# Patient Record
Sex: Female | Born: 1960 | Race: White | Hispanic: No | Marital: Married | Smoking: Former smoker
Health system: Southern US, Community
[De-identification: ages and names within clinical notes are randomized; demographics above are authoritative.]

## PROBLEM LIST (undated history)

## (undated) DIAGNOSIS — Z87442 Personal history of urinary calculi: Secondary | ICD-10-CM

## (undated) DIAGNOSIS — I10 Essential (primary) hypertension: Secondary | ICD-10-CM

## (undated) DIAGNOSIS — N289 Disorder of kidney and ureter, unspecified: Secondary | ICD-10-CM

## (undated) DIAGNOSIS — K219 Gastro-esophageal reflux disease without esophagitis: Secondary | ICD-10-CM

## (undated) DIAGNOSIS — C801 Malignant (primary) neoplasm, unspecified: Secondary | ICD-10-CM

## (undated) DIAGNOSIS — C9000 Multiple myeloma not having achieved remission: Secondary | ICD-10-CM

## (undated) DIAGNOSIS — I739 Peripheral vascular disease, unspecified: Secondary | ICD-10-CM

## (undated) HISTORY — PX: CHOLECYSTECTOMY: SHX55

## (undated) HISTORY — DX: Multiple myeloma not having achieved remission: C90.00

## (undated) HISTORY — DX: Essential (primary) hypertension: I10

## (undated) HISTORY — DX: Peripheral vascular disease, unspecified: I73.9

---

## 2005-06-17 ENCOUNTER — Ambulatory Visit: Payer: Self-pay | Admitting: Cardiology

## 2005-12-30 ENCOUNTER — Ambulatory Visit: Payer: Self-pay | Admitting: Cardiology

## 2015-01-05 HISTORY — PX: COLONOSCOPY: SHX174

## 2017-05-13 ENCOUNTER — Encounter: Payer: Self-pay | Admitting: Gastroenterology

## 2017-07-05 ENCOUNTER — Ambulatory Visit: Payer: Self-pay | Admitting: Nurse Practitioner

## 2017-08-27 ENCOUNTER — Encounter: Payer: Self-pay | Admitting: *Deleted

## 2017-08-27 ENCOUNTER — Ambulatory Visit (INDEPENDENT_AMBULATORY_CARE_PROVIDER_SITE_OTHER): Payer: BLUE CROSS/BLUE SHIELD | Admitting: Gastroenterology

## 2017-08-27 ENCOUNTER — Other Ambulatory Visit: Payer: Self-pay | Admitting: *Deleted

## 2017-08-27 ENCOUNTER — Encounter: Payer: Self-pay | Admitting: Gastroenterology

## 2017-08-27 DIAGNOSIS — K649 Unspecified hemorrhoids: Secondary | ICD-10-CM

## 2017-08-27 DIAGNOSIS — Z86010 Personal history of colonic polyps: Secondary | ICD-10-CM

## 2017-08-27 MED ORDER — PEG 3350-KCL-NA BICARB-NACL 420 G PO SOLR: 4000.0000 mL | Freq: Once | ORAL | 0 refills | Status: AC

## 2017-08-27 NOTE — Patient Instructions (Signed)
1. Colonoscopy as scheduled. See separate instructions.  

## 2017-08-27 NOTE — Progress Notes (Signed)
Primary Care Physician:  [name], MD  Primary Gastroenterologist:  Garfield Cornea, MD   Chief Complaint  Patient presents with  . Colonoscopy    hx of polyps/ hemorrhoids    HPI:  Megan Velasquez is a 57 y.o. female here to establish GI care and because she is due for a colonoscopy.  Patient's last colonoscopy was in 2016 by Dr. Britta Mccreedy.  She had a couple of polyps removed, one was a serrated adenoma changes noted.  She was told to come back in 3 years.  She states she is doing well.  Bowel movements are regular.  No blood in the stool or melena.  Denies any abdominal pain.  Reflux well controlled on Nexium or over-the-counter agents.  No dysphagia.  No vomiting.  No unintentional weight loss.  She complains of hemorrhoids to aggravate her from time to time, associated with discomfort and making it difficult to clean up after a bowel movement.  Current Outpatient Medications  Medication Sig Dispense Refill  . aspirin EC 81 MG tablet Take 81 mg by mouth daily.    Marland Kitchen esomeprazole (NEXIUM) 20 MG capsule Take 20 mg by mouth daily as needed.    Marland Kitchen lisinopril-hydrochlorothiazide (PRINZIDE,ZESTORETIC) 10-12.5 MG tablet Take 1 tablet by mouth daily.    . NON FORMULARY Vitamin D3   5000 IU  daily    . vitamin C (ASCORBIC ACID) 500 MG tablet Take 500 mg by mouth daily.     No current facility-administered medications for this visit.     Allergies as of 08/27/2017  . (No Known Allergies)    Past Medical History:  Diagnosis Date  . HTN (hypertension)     Past Surgical History:  Procedure Laterality Date  . CHOLECYSTECTOMY    . COLONOSCOPY  01/2015   Dr. Britta Mccreedy: Mild diverticulosis, sessile polyp ranging 3 to 5 mm removed from the proximal transverse colon, semi-pedunculated polyp 5 to 9 mm in size removed from the sigmoid colon.  Sigmoid colon polyp was serrated adenoma, transverse colon polyp was adenomatous.  Patient was told to have another colonoscopy in 3 years.    Family History   Problem Relation Age of Onset  . Heart disease Mother   . Diabetes Mother   . Lung cancer Father   . COPD Father   . Colon cancer Neg Hx     Social History   Socioeconomic History  . Marital status: Married    Spouse name: Not on file  . Number of children: Not on file  . Years of education: Not on file  . Highest education level: Not on file  Occupational History  . Not on file  Social Needs  . Financial resource strain: Not on file  . Food insecurity:    Worry: Not on file    Inability: Not on file  . Transportation needs:    Medical: Not on file    Non-medical: Not on file  Tobacco Use  . Smoking status: Former Research scientist (life sciences)  . Smokeless tobacco: Never Used  . Tobacco comment: Quit x 3 years  Substance and Sexual Activity  . Alcohol use: Yes    Comment: 2 beers per year  . Drug use: Never  . Sexual activity: Not on file  Lifestyle  . Physical activity:    Days per week: Not on file    Minutes per session: Not on file  . Stress: Not on file  Relationships  . Social connections:    Talks on phone: Not  on file    Gets together: Not on file    Attends religious service: Not on file    Active member of club or organization: Not on file    Attends meetings of clubs or organizations: Not on file    Relationship status: Not on file  . Intimate partner violence:    Fear of current or ex partner: Not on file    Emotionally abused: Not on file    Physically abused: Not on file    Forced sexual activity: Not on file  Other Topics Concern  . Not on file  Social History Narrative  . Not on file      ROS:  General: Negative for anorexia, weight loss, fever, chills, fatigue, weakness. Eyes: Negative for vision changes.  ENT: Negative for hoarseness, difficulty swallowing, nasal congestion. CV: Negative for chest pain, angina, palpitations, dyspnea on exertion, peripheral edema.  Respiratory: Negative for dyspnea at rest, dyspnea on exertion, cough, sputum, wheezing.   GI: See history of present illness. GU:  Negative for dysuria, hematuria, urinary incontinence, urinary frequency, nocturnal urination.  MS: Negative for joint pain, low back pain.  Derm: Negative for rash or itching.  Neuro: Negative for weakness, abnormal sensation, seizure, frequent headaches, memory loss, confusion.  Psych: Negative for anxiety, depression, suicidal ideation, hallucinations.  Endo: Negative for unusual weight change.  Heme: Negative for bruising or bleeding. Allergy: Negative for rash or hives.    Physical Examination:  BP 124/78   Pulse 69   Temp 97.8 F (36.6 C) (Oral)   Ht 5\' 5"  (1.651 m)   Wt 142 lb (64.4 kg)   BMI 23.63 kg/m    General: Well-nourished, well-developed in no acute distress.  Accompanied by spouse Head: Normocephalic, atraumatic.   Eyes: Conjunctiva pink, no icterus. Mouth: Oropharyngeal mucosa moist and pink , no lesions erythema or exudate. Neck: Supple without thyromegaly, masses, or lymphadenopathy.  Lungs: Clear to auscultation bilaterally.  Heart: Regular rate and rhythm, no murmurs rubs or gallops.  Abdomen: Bowel sounds are normal, nontender, nondistended, no hepatosplenomegaly or masses, no abdominal bruits or    hernia , no rebound or guarding.   Rectal: Not performed Extremities: No lower extremity edema. No clubbing or deformities.  Neuro: Alert and oriented x 4 , grossly normal neurologically.  Skin: Warm and dry, no rash or jaundice.   Psych: Alert and cooperative, normal mood and affect.   Imaging Studies: No results found.

## 2017-08-27 NOTE — Assessment & Plan Note (Signed)
57 year old female with history of adenomatous colon polyps advised to come back in 3 years for surveillance purposes by her prior gastroenterologist who presents for further management.  She complains of intermittent flare of her hemorrhoids.  We discussed possible hemorrhoid banding [facility]) if she is a good candidate.  Can be evaluated at time of colonoscopy.  Patient like to have colonoscopy done with similar sedation as before.  We will plan on deep sedation in the near future.  I have discussed the risks, alternatives, benefits with regards to but not limited to the risk of reaction to medication, bleeding, infection, perforation and the patient is agreeable to proceed. Written consent to be obtained.

## 2017-08-31 ENCOUNTER — Telehealth: Payer: Self-pay | Admitting: *Deleted

## 2017-08-31 NOTE — Telephone Encounter (Signed)
Pre-op scheduled for 10/06/17 at 10:00am. Letter mailed. LMOVM

## 2017-08-31 NOTE — Progress Notes (Signed)
CC'D TO PCP °

## 2017-08-31 NOTE — Telephone Encounter (Signed)
Called [employer] and was advised no PA is required for TCS. Ref # [id number]

## 2017-10-01 ENCOUNTER — Other Ambulatory Visit (HOSPITAL_COMMUNITY): Payer: BLUE CROSS/BLUE SHIELD

## 2017-10-04 NOTE — Patient Instructions (Signed)
Megan Velasquez  10/04/2017     @PREFPERIOPPHARMACY @   Your procedure is scheduled on   10/11/2017 .  Report to [facility] at  1130  A.M.  Call this number if you have problems the morning of surgery:  [phone]   Remember:  Do not eat or drink after midnight.  You may drink clear liquids until ( follow the instructions given to you) .  Clear liquids allowed are:                    Water, Juice (non-citric and without pulp), Carbonated beverages, Clear Tea, Black Coffee only, Plain Jell-O only, Gatorade and Plain Popsicles only    Take these medicines the morning of surgery with A SIP OF WATER  Nexium, lisinopril.    Do not wear jewelry, make-up or nail polish.  Do not wear lotions, powders, or perfumes, or deodorant.  Do not shave 48 hours prior to surgery.  Men may shave face and neck.  Do not bring valuables to the hospital.  [facility] is not responsible for any belongings or valuables.  Contacts, dentures or bridgework may not be worn into surgery.  Leave your suitcase in the car.  After surgery it may be brought to your room.  For patients admitted to the hospital, discharge time will be determined by your treatment team.  Patients discharged the day of surgery will not be allowed to drive home.   Name and phone number of your driver:   family Special instructions:  Follow the diet and prep instructions given to you by Dr Roseanne Kaufman office.  Please read over the following fact sheets that you were given. Anesthesia Post-op Instructions and Care and Recovery After Surgery       Colonoscopy, Adult A colonoscopy is an exam to look at the large intestine. It is done to check for problems, such as:  Lumps (tumors).  Growths (polyps).  Swelling (inflammation).  Bleeding.  What happens before the procedure? Eating and drinking Follow instructions from your doctor about eating and drinking. These instructions may include:  A few days before the  procedure - follow a low-fiber diet. ? Avoid nuts. ? Avoid seeds. ? Avoid dried fruit. ? Avoid raw fruits. ? Avoid vegetables.  1-3 days before the procedure - follow a clear liquid diet. Avoid liquids that have red or purple dye. Drink only clear liquids, such as: ? Clear broth or bouillon. ? Black coffee or tea. ? Clear juice. ? Clear soft drinks or sports drinks. ? Gelatin dessert. ? Popsicles.  On the day of the procedure - do not eat or drink anything during the 2 hours before the procedure.  Bowel prep If you were prescribed an oral bowel prep:  Take it as told by your doctor. Starting the day before your procedure, you will need to drink a lot of liquid. The liquid will cause you to poop (have bowel movements) until your poop is almost clear or light green.  If your skin or butt gets irritated from diarrhea, you may: ? Wipe the area with wipes that have medicine in them, such as adult wet wipes with aloe and vitamin E. ? Put something on your skin that soothes the area, such as petroleum jelly.  If you throw up (vomit) while drinking the bowel prep, take a break for up to 60 minutes. Then begin the bowel prep again. If you keep throwing up and  you cannot take the bowel prep without throwing up, call your doctor.  General instructions  Ask your doctor about changing or stopping your normal medicines. This is important if you take diabetes medicines or blood thinners.  Plan to have someone take you home from the hospital or clinic. What happens during the procedure?  An IV tube may be put into one of your veins.  You will be given medicine to help you relax (sedative).  To reduce your risk of infection: ? Your doctors will wash their hands. ? Your anal area will be washed with soap.  You will be asked to lie on your side with your knees bent.  Your doctor will get a long, thin, flexible tube ready. The tube will have a camera and a light on the end.  The tube will  be put into your anus.  The tube will be gently put into your large intestine.  Air will be delivered into your large intestine to keep it open. You may feel some pressure or cramping.  The camera will be used to take photos.  A small tissue sample may be removed from your body to be looked at under a microscope (biopsy). If any possible problems are found, the tissue will be sent to a lab for testing.  If small growths are found, your doctor may remove them and have them checked for cancer.  The tube that was put into your anus will be slowly removed. The procedure may vary among doctors and hospitals. What happens after the procedure?  Your doctor will check on you often until the medicines you were given have worn off.  Do not drive for 24 hours after the procedure.  You may have a small amount of blood in your poop.  You may pass gas.  You may have mild cramps or bloating in your belly (abdomen).  It is up to you to get the results of your procedure. Ask your doctor, or the department performing the procedure, when your results will be ready. This information is not intended to replace advice given to you by your health care provider. Make sure you discuss any questions you have with your health care provider. Document Released: 04/25/2010 Document Revised: 01/22/2016 Document Reviewed: 06/04/2015 Elsevier Interactive Patient Education  2017 [employer]  Colonoscopy, Adult, Care After This sheet gives you information about how to care for yourself after your procedure. Your health care provider may also give you more specific instructions. If you have problems or questions, contact your health care provider. What can I expect after the procedure? After the procedure, it is common to have:  A small amount of blood in your stool for 24 hours after the procedure.  Some gas.  Mild abdominal cramping or bloating.  Follow these instructions at home: General  instructions   For the first 24 hours after the procedure: ? Do not drive or use machinery. ? Do not sign important documents. ? Do not drink alcohol. ? Do your regular daily activities at a slower pace than normal. ? Eat soft, easy-to-digest foods. ? Rest often.  Take over-the-counter or prescription medicines only as told by your health care provider.  It is up to you to get the results of your procedure. Ask your health care provider, or the department performing the procedure, when your results will be ready. Relieving cramping and bloating  Try walking around when you have cramps or feel bloated.  Apply heat to your abdomen as told  by your health care provider. Use a heat source that your health care provider recommends, such as a moist heat pack or a heating pad. ? Place a towel between your skin and the heat source. ? Leave the heat on for 20-30 minutes. ? Remove the heat if your skin turns bright red. This is especially important if you are unable to feel pain, heat, or cold. You may have a greater risk of getting burned. Eating and drinking  Drink enough fluid to keep your urine clear or pale yellow.  Resume your normal diet as instructed by your health care provider. Avoid heavy or fried foods that are hard to digest.  Avoid drinking alcohol for as long as instructed by your health care provider. Contact a health care provider if:  You have blood in your stool 2-3 days after the procedure. Get help right away if:  You have more than a small spotting of blood in your stool.  You pass large blood clots in your stool.  Your abdomen is swollen.  You have nausea or vomiting.  You have a fever.  You have increasing abdominal pain that is not relieved with medicine. This information is not intended to replace advice given to you by your health care provider. Make sure you discuss any questions you have with your health care provider. Document Released: 11/05/2003  Document Revised: 12/16/2015 Document Reviewed: 06/04/2015 Elsevier Interactive Patient Education  2018 [place] Anesthesia is a term that refers to techniques, procedures, and medicines that help a person stay safe and comfortable during a medical procedure. Monitored anesthesia care, or sedation, is one type of anesthesia. Your anesthesia specialist may recommend sedation if you will be having a procedure that does not require you to be unconscious, such as:  Cataract surgery.  A dental procedure.  A biopsy.  A colonoscopy.  During the procedure, you may receive a medicine to help you relax (sedative). There are three levels of sedation:  Mild sedation. At this level, you may feel awake and relaxed. You will be able to follow directions.  Moderate sedation. At this level, you will be sleepy. You may not remember the procedure.  Deep sedation. At this level, you will be asleep. You will not remember the procedure.  The more medicine you are given, the deeper your level of sedation will be. Depending on how you respond to the procedure, the anesthesia specialist may change your level of sedation or the type of anesthesia to fit your needs. An anesthesia specialist will monitor you closely during the procedure. Let your health care provider know about:  Any allergies you have.  All medicines you are taking, including vitamins, herbs, eye drops, creams, and over-the-counter medicines.  Any use of steroids (by mouth or as a cream).  Any problems you or family members have had with sedatives and anesthetic medicines.  Any blood disorders you have.  Any surgeries you have had.  Any medical conditions you have, such as sleep apnea.  Whether you are pregnant or may be pregnant.  Any use of cigarettes, alcohol, or street drugs. What are the risks? Generally, this is a safe procedure. However, problems may occur, including:  Getting too much  medicine (oversedation).  Nausea.  Allergic reaction to medicines.  Trouble breathing. If this happens, a breathing tube may be used to help with breathing. It will be removed when you are awake and breathing on your own.  Heart trouble.  Lung trouble.  Before the procedure Staying hydrated Follow instructions from your health care provider about hydration, which may include:  Up to 2 hours before the procedure - you may continue to drink clear liquids, such as water, clear fruit juice, black coffee, and plain tea.  Eating and drinking restrictions Follow instructions from your health care provider about eating and drinking, which may include:  8 hours before the procedure - stop eating heavy meals or foods such as meat, fried foods, or fatty foods.  6 hours before the procedure - stop eating light meals or foods, such as toast or cereal.  6 hours before the procedure - stop drinking milk or drinks that contain milk.  2 hours before the procedure - stop drinking clear liquids.  Medicines Ask your health care provider about:  Changing or stopping your regular medicines. This is especially important if you are taking diabetes medicines or blood thinners.  Taking medicines such as aspirin and ibuprofen. These medicines can thin your blood. Do not take these medicines before your procedure if your health care provider instructs you not to.  Tests and exams  You will have a physical exam.  You may have blood tests done to show: ? How well your kidneys and liver are working. ? How well your blood can clot.  General instructions  Plan to have someone take you home from the hospital or clinic.  If you will be going home right after the procedure, plan to have someone with you for 24 hours.  What happens during the procedure?  Your blood pressure, heart rate, breathing, level of pain and overall condition will be monitored.  An IV tube will be inserted into one of your  veins.  Your anesthesia specialist will give you medicines as needed to keep you comfortable during the procedure. This may mean changing the level of sedation.  The procedure will be performed. After the procedure  Your blood pressure, heart rate, breathing rate, and blood oxygen level will be monitored until the medicines you were given have worn off.  Do not drive for 24 hours if you received a sedative.  You may: ? Feel sleepy, clumsy, or nauseous. ? Feel forgetful about what happened after the procedure. ? Have a sore throat if you had a breathing tube during the procedure. ? Vomit. This information is not intended to replace advice given to you by your health care provider. Make sure you discuss any questions you have with your health care provider. Document Released: 12/17/2004 Document Revised: 08/30/2015 Document Reviewed: 07/14/2015 Elsevier Interactive Patient Education  2018 [place], Care After These instructions provide you with information about caring for yourself after your procedure. Your health care provider may also give you more specific instructions. Your treatment has been planned according to current medical practices, but problems sometimes occur. Call your health care provider if you have any problems or questions after your procedure. What can I expect after the procedure? After your procedure, it is common to:  Feel sleepy for several hours.  Feel clumsy and have poor balance for several hours.  Feel forgetful about what happened after the procedure.  Have poor judgment for several hours.  Feel nauseous or vomit.  Have a sore throat if you had a breathing tube during the procedure.  Follow these instructions at home: For at least 24 hours after the procedure:   Do not: ? Participate in activities in which you could fall or become injured. ? Drive. ?  Use heavy machinery. ? Drink alcohol. ? Take sleeping pills or  medicines that cause drowsiness. ? Make important decisions or sign legal documents. ? Take care of children on your own.  Rest. Eating and drinking  Follow the diet that is recommended by your health care provider.  If you vomit, drink water, juice, or soup when you can drink without vomiting.  Make sure you have little or no nausea before eating solid foods. General instructions  Have a responsible adult stay with you until you are awake and alert.  Take over-the-counter and prescription medicines only as told by your health care provider.  If you smoke, do not smoke without supervision.  Keep all follow-up visits as told by your health care provider. This is important. Contact a health care provider if:  You keep feeling nauseous or you keep vomiting.  You feel light-headed.  You develop a rash.  You have a fever. Get help right away if:  You have trouble breathing. This information is not intended to replace advice given to you by your health care provider. Make sure you discuss any questions you have with your health care provider. Document Released: 07/14/2015 Document Revised: 11/13/2015 Document Reviewed: 07/14/2015 Elsevier Interactive Patient Education  [employer].

## 2017-10-06 ENCOUNTER — Telehealth: Payer: Self-pay | Admitting: *Deleted

## 2017-10-06 ENCOUNTER — Encounter (HOSPITAL_COMMUNITY)
Admission: RE | Admit: 2017-10-06 | Discharge: 2017-10-06 | Disposition: A | Discharge status: Home / Self Care | Payer: BLUE CROSS/BLUE SHIELD | Source: Ambulatory Visit | Attending: Internal Medicine | Admitting: Internal Medicine

## 2017-10-06 NOTE — Telephone Encounter (Signed)
Received call from [place]. Patient missed her pre-op appt. Procedure scheduled for 10/11/17.   Called pt and LMOVM. Called # listed for spouse. Spoke with patient. She stated she had cataract surgery. Spoke with Hoyle Sauer and patient can do pre-op 10/08/17 at 9:30am. Called patient back and made her aware. Nothing further needed

## 2017-10-08 ENCOUNTER — Encounter (HOSPITAL_COMMUNITY): Payer: Self-pay

## 2017-10-08 ENCOUNTER — Encounter (HOSPITAL_COMMUNITY)
Admission: RE | Admit: 2017-10-08 | Discharge: 2017-10-08 | Disposition: A | Discharge status: Home / Self Care | Payer: BLUE CROSS/BLUE SHIELD | Source: Ambulatory Visit | Attending: Internal Medicine | Admitting: Internal Medicine

## 2017-10-08 ENCOUNTER — Ambulatory Visit (HOSPITAL_COMMUNITY): Admit: 2017-10-08 | Payer: BLUE CROSS/BLUE SHIELD | Admitting: Internal Medicine

## 2017-10-08 ENCOUNTER — Other Ambulatory Visit: Payer: Self-pay

## 2017-10-08 DIAGNOSIS — R9431 Abnormal electrocardiogram [ECG] [EKG]: Secondary | ICD-10-CM | POA: Diagnosis not present

## 2017-10-08 DIAGNOSIS — Z01810 Encounter for preprocedural cardiovascular examination: Secondary | ICD-10-CM | POA: Diagnosis present

## 2017-10-08 DIAGNOSIS — Z01812 Encounter for preprocedural laboratory examination: Secondary | ICD-10-CM | POA: Insufficient documentation

## 2017-10-08 HISTORY — DX: Gastro-esophageal reflux disease without esophagitis: K21.9

## 2017-10-08 HISTORY — DX: Personal history of urinary calculi: Z87.442

## 2017-10-08 LAB — CBC WITH DIFFERENTIAL/PLATELET
BASOS ABS: 0 10*3/uL (ref 0.0–0.1)
Basophils Relative: 0 %
EOS ABS: 0.1 10*3/uL (ref 0.0–0.7)
EOS PCT: 2 %
HEMATOCRIT: 37.5 % (ref 36.0–46.0)
Hemoglobin: 12.4 g/dL (ref 12.0–15.0)
Lymphocytes Relative: 30 %
Lymphs Abs: 2.2 10*3/uL (ref 0.7–4.0)
MCH: 30.2 pg (ref 26.0–34.0)
MCHC: 33.1 g/dL (ref 30.0–36.0)
MCV: 91.2 fL (ref 78.0–100.0)
MONO ABS: 0.5 10*3/uL (ref 0.1–1.0)
Monocytes Relative: 6 %
Neutro Abs: 4.4 10*3/uL (ref 1.7–7.7)
Neutrophils Relative %: 62 %
PLATELETS: 216 10*3/uL (ref 150–400)
RBC: 4.11 MIL/uL (ref 3.87–5.11)
RDW: 13.4 % (ref 11.5–15.5)
WBC: 7.2 10*3/uL (ref 4.0–10.5)

## 2017-10-08 LAB — BASIC METABOLIC PANEL
Anion gap: 11 (ref 5–15)
BUN: 18 mg/dL (ref 6–20)
CO2: 28 mmol/L (ref 22–32)
Calcium: 9.1 mg/dL (ref 8.9–10.3)
Chloride: 101 mmol/L (ref 98–111)
Creatinine, Ser: 0.96 mg/dL (ref 0.44–1.00)
GFR calc Af Amer: >60 mL/min (ref >60)
GLUCOSE: 86 mg/dL (ref 70–99)
POTASSIUM: 3.6 mmol/L (ref 3.5–5.1)
SODIUM: 140 mmol/L (ref 135–145)

## 2017-10-08 SURGERY — COLONOSCOPY WITH PROPOFOL
Anesthesia: Monitor Anesthesia Care

## 2017-10-11 ENCOUNTER — Ambulatory Visit (HOSPITAL_COMMUNITY)
Admission: RE | Admit: 2017-10-11 | Discharge: 2017-10-11 | Disposition: A | Discharge status: Home / Self Care | Payer: BLUE CROSS/BLUE SHIELD | Source: Ambulatory Visit | Attending: Internal Medicine | Admitting: Internal Medicine

## 2017-10-11 ENCOUNTER — Ambulatory Visit (HOSPITAL_COMMUNITY): Payer: BLUE CROSS/BLUE SHIELD | Admitting: Anesthesiology

## 2017-10-11 ENCOUNTER — Encounter (HOSPITAL_COMMUNITY)
Admission: RE | Disposition: A | Discharge status: Home / Self Care | Payer: Self-pay | Source: Ambulatory Visit | Attending: Internal Medicine

## 2017-10-11 ENCOUNTER — Encounter (HOSPITAL_COMMUNITY): Payer: Self-pay | Admitting: Certified Registered"

## 2017-10-11 DIAGNOSIS — K219 Gastro-esophageal reflux disease without esophagitis: Secondary | ICD-10-CM | POA: Diagnosis not present

## 2017-10-11 DIAGNOSIS — D124 Benign neoplasm of descending colon: Secondary | ICD-10-CM | POA: Diagnosis not present

## 2017-10-11 DIAGNOSIS — Z1211 Encounter for screening for malignant neoplasm of colon: Secondary | ICD-10-CM | POA: Diagnosis not present

## 2017-10-11 DIAGNOSIS — K5730 Diverticulosis of large intestine without perforation or abscess without bleeding: Secondary | ICD-10-CM

## 2017-10-11 DIAGNOSIS — Z87891 Personal history of nicotine dependence: Secondary | ICD-10-CM | POA: Insufficient documentation

## 2017-10-11 DIAGNOSIS — Z79899 Other long term (current) drug therapy: Secondary | ICD-10-CM | POA: Diagnosis not present

## 2017-10-11 DIAGNOSIS — K641 Second degree hemorrhoids: Secondary | ICD-10-CM | POA: Diagnosis not present

## 2017-10-11 DIAGNOSIS — Z7982 Long term (current) use of aspirin: Secondary | ICD-10-CM | POA: Diagnosis not present

## 2017-10-11 DIAGNOSIS — Z87442 Personal history of urinary calculi: Secondary | ICD-10-CM | POA: Diagnosis not present

## 2017-10-11 DIAGNOSIS — Z86010 Personal history of colonic polyps: Secondary | ICD-10-CM | POA: Insufficient documentation

## 2017-10-11 DIAGNOSIS — I10 Essential (primary) hypertension: Secondary | ICD-10-CM | POA: Diagnosis not present

## 2017-10-11 HISTORY — PX: COLONOSCOPY WITH PROPOFOL: SHX5780

## 2017-10-11 HISTORY — PX: POLYPECTOMY: SHX5525

## 2017-10-11 SURGERY — COLONOSCOPY WITH PROPOFOL
Anesthesia: Monitor Anesthesia Care

## 2017-10-11 MED ORDER — HYDROMORPHONE HCL 1 MG/ML IJ SOLN: 0.2500 mg | INTRAMUSCULAR | Status: DC | PRN

## 2017-10-11 MED ORDER — LIDOCAINE HCL (PF) 1 % IJ SOLN
INTRAMUSCULAR | Status: DC | PRN
  Administered 2017-10-11: 20 mg

## 2017-10-11 MED ORDER — LACTATED RINGERS IV SOLN: INTRAVENOUS | Status: DC

## 2017-10-11 MED ORDER — MEPERIDINE HCL 100 MG/ML IJ SOLN: 6.2500 mg | INTRAMUSCULAR | Status: DC | PRN

## 2017-10-11 MED ORDER — PROMETHAZINE HCL 25 MG/ML IJ SOLN: 6.2500 mg | INTRAMUSCULAR | Status: DC | PRN

## 2017-10-11 MED ORDER — HYDROCODONE-ACETAMINOPHEN 7.5-325 MG PO TABS: 1.0000 | ORAL_TABLET | Freq: Once | ORAL | Status: DC | PRN

## 2017-10-11 MED ORDER — PROPOFOL 10 MG/ML IV BOLUS
INTRAVENOUS | Status: DC | PRN
  Administered 2017-10-11: 50 mg via INTRAVENOUS
  Administered 2017-10-11: 100 mg via INTRAVENOUS
  Administered 2017-10-11: 20 mg via INTRAVENOUS
  Administered 2017-10-11: 50 mg via INTRAVENOUS
  Administered 2017-10-11: 30 mg via INTRAVENOUS

## 2017-10-11 MED ORDER — LACTATED RINGERS IV SOLN
INTRAVENOUS | Status: DC | PRN
  Administered 2017-10-11: 14:00:00 via INTRAVENOUS

## 2017-10-11 MED ORDER — PROPOFOL 10 MG/ML IV BOLUS
INTRAVENOUS | Status: AC
  Filled 2017-10-11: qty 20

## 2017-10-11 NOTE — H&P (Signed)
@LOGO @   Primary Care Physician:  [name], MD Primary Gastroenterologist:  Dr. [name]  Pre-Procedure History & Physical: HPI:  Megan Velasquez is a 57 y.o. female here for surveillance colonoscopy. History of multiple colonic polyps removed in [facility] about 3 years ago.  Intermittent dangling hemorrhoids or an aggravation for her as she reports.   Past Medical History:  Diagnosis Date  . GERD (gastroesophageal reflux disease)   . History of kidney stones   . HTN (hypertension)     Past Surgical History:  Procedure Laterality Date  . CHOLECYSTECTOMY    . COLONOSCOPY  01/2015   Dr. Britta Mccreedy: Mild diverticulosis, sessile polyp ranging 3 to 5 mm removed from the proximal transverse colon, semi-pedunculated polyp 5 to 9 mm in size removed from the sigmoid colon.  Sigmoid colon polyp was serrated adenoma, transverse colon polyp was adenomatous.  Patient was told to have another colonoscopy in 3 years.    Prior to Admission medications   Medication Sig Start Date End Date Taking? Authorizing Provider  acetaminophen (TYLENOL) 500 MG tablet Take 1,000 mg by mouth daily as needed for headache.   Yes [provider]  aspirin EC 81 MG tablet Take 81 mg by mouth daily.   Yes [provider]  Carboxymethylcellul-Glycerin (LUBRICATING EYE DROPS OP) Place 1 drop into the right eye 4 (four) times daily. Use for 3 days prior to surgery   Yes [provider]  Cholecalciferol (VITAMIN D3) 5000 units CAPS Take 5,000 Units by mouth daily.   Yes [provider]  lisinopril-hydrochlorothiazide (PRINZIDE,ZESTORETIC) 10-12.5 MG tablet Take 1 tablet by mouth daily.   Yes [provider]  loratadine (CLARITIN) 10 MG tablet Take 10 mg by mouth daily as needed for allergies.   Yes [provider]  vitamin C (ASCORBIC ACID) 500 MG tablet Take 500 mg by mouth daily.   Yes [provider]  esomeprazole (NEXIUM) 20 MG capsule Take 20-40 mg by  mouth daily as needed (acid reflux).     [provider]    Allergies as of 08/27/2017  . (No Known Allergies)    Family History  Problem Relation Age of Onset  . Heart disease Mother   . Diabetes Mother   . Lung cancer Father   . COPD Father   . Colon cancer Neg Hx     Social History   Socioeconomic History  . Marital status: Married    Spouse name: Not on file  . Number of children: Not on file  . Years of education: Not on file  . Highest education level: Not on file  Occupational History  . Not on file  Social Needs  . Financial resource strain: Not on file  . Food insecurity:    Worry: Not on file    Inability: Not on file  . Transportation needs:    Medical: Not on file    Non-medical: Not on file  Tobacco Use  . Smoking status: Former Smoker    Packs/day: 1.00    Years: 28.00    Pack years: 28.00    Types: Cigarettes    Last attempt to quit: 10/08/2013    Years since quitting: 4.0  . Smokeless tobacco: Never Used  Substance and Sexual Activity  . Alcohol use: Yes    Comment: 2 beers per year  . Drug use: Never  . Sexual activity: Yes    Birth control/protection: Post-menopausal  Lifestyle  . Physical activity:  Days per week: Not on file    Minutes per session: Not on file  . Stress: Not on file  Relationships  . Social connections:    Talks on phone: Not on file    Gets together: Not on file    Attends religious service: Not on file    Active member of club or organization: Not on file    Attends meetings of clubs or organizations: Not on file    Relationship status: Not on file  . Intimate partner violence:    Fear of current or ex partner: Not on file    Emotionally abused: Not on file    Physically abused: Not on file    Forced sexual activity: Not on file  Other Topics Concern  . Not on file  Social History Narrative  . Not on file    Review of Systems: See HPI, otherwise negative ROS  Physical Exam: BP 125/77 (BP  Location: Left Arm)   Pulse 88   Temp 97.9 F (36.6 C)   Resp 18   SpO2 97%  General:   Alert,  Well-developed, well-nourished, pleasant and cooperative in NAD Neck:  Supple; no masses or thyromegaly. No significant cervical adenopathy. Lungs:  Clear throughout to auscultation.   No wheezes, crackles, or rhonchi. No acute distress. Heart:  Regular rate and rhythm; no murmurs, clicks, rubs,  or gallops. Abdomen: Non-distended, normal bowel sounds.  Soft and nontender without appreciable mass or hepatosplenomegaly.  Pulses:  Normal pulses noted. Extremities:  Without clubbing or edema.  Impression:  57 year old here for surveillance colonoscopy. History of colonic polyps removed in the 3 years ago. By history, has issues with hemorrhoids.  Recommendations:  I have offered the patient a colonoscopy today.  The risks, benefits, limitations, alternatives and imponderables have been reviewed with the patient. Questions have been answered. All parties are agreeable.    Notice: This dictation was prepared with Dragon dictation along with smaller phrase technology. Any transcriptional errors that result from this process are unintentional and may not be corrected upon review.

## 2017-10-11 NOTE — Progress Notes (Signed)
Megan Velasquez had a medical procedure on 10/11/2017 at [facility] .  She will be able to return to work on 10/13/2017.   Thank you [name] RN [facility]

## 2017-10-11 NOTE — Discharge Instructions (Signed)
Colonoscopy Discharge Instructions  Read the instructions outlined below and refer to this sheet in the next few weeks. These discharge instructions provide you with general information on caring for yourself after you leave the hospital. Your doctor may also give you specific instructions. While your treatment has been planned according to the most current medical practices available, unavoidable complications occasionally occur. If you have any problems or questions after discharge, call Dr. [name] at [phone]. ACTIVITY  You may resume your regular activity, but move at a slower pace for the next 24 hours.   Take frequent rest periods for the next 24 hours.   Walking will help get rid of the air and reduce the bloated feeling in your belly (abdomen).   No driving for 24 hours (because of the medicine (anesthesia) used during the test).    Do not sign any important legal documents or operate any machinery for 24 hours (because of the anesthesia used during the test).  NUTRITION  Drink plenty of fluids.   You may resume your normal diet as instructed by your doctor.   Begin with a light meal and progress to your normal diet. Heavy or fried foods are harder to digest and may make you feel sick to your stomach (nauseated).   Avoid alcoholic beverages for 24 hours or as instructed.  MEDICATIONS  You may resume your normal medications unless your doctor tells you otherwise.  WHAT YOU CAN EXPECT TODAY  Some feelings of bloating in the abdomen.   Passage of more gas than usual.   Spotting of blood in your stool or on the toilet paper.  IF YOU HAD POLYPS REMOVED DURING THE COLONOSCOPY:  No aspirin products for 7 days or as instructed.   No alcohol for 7 days or as instructed.   Eat a soft diet for the next 24 hours.  FINDING OUT THE RESULTS OF YOUR TEST Not all test results are available during your visit. If your test results are not back during the visit, make an appointment  with your caregiver to find out the results. Do not assume everything is normal if you have not heard from your caregiver or the medical facility. It is important for you to follow up on all of your test results.  SEEK IMMEDIATE MEDICAL ATTENTION IF:  You have more than a spotting of blood in your stool.   Your belly is swollen (abdominal distention).   You are nauseated or vomiting.   You have a temperature over 101.   You have abdominal pain or discomfort that is severe or gets worse throughout the day.    Polyp, diverticulosis and hemorrhoid information provided  Pamphlet on hemorrhoids banding provided.  Office visit with [place] in 6 weeks  Further recommendations to follow pending review of pathology report.  PATIENT INSTRUCTIONS POST-ANESTHESIA  IMMEDIATELY FOLLOWING SURGERY:  Do not drive or operate machinery for the first twenty four hours after surgery.  Do not make any important decisions for twenty four hours after surgery or while taking narcotic pain medications or sedatives.  If you develop intractable nausea and vomiting or a severe headache please notify your doctor immediately.  FOLLOW-UP:  Please make an appointment with your surgeon as instructed. You do not need to follow up with anesthesia unless specifically instructed to do so.  WOUND CARE INSTRUCTIONS (if applicable):  Keep a dry clean dressing on the anesthesia/puncture wound site if there is drainage.  Once the wound has quit draining you may leave it  open to air.  Generally you should leave the bandage intact for twenty four hours unless there is drainage.  If the epidural site drains for more than 36-48 hours please call the anesthesia department.  QUESTIONS?:  Please feel free to call your physician or the hospital operator if you have any questions, and they will be happy to assist you.      Hemorrhoids Hemorrhoids are swollen veins in and around the rectum or anus. Hemorrhoids can cause pain, itching, or  bleeding. Most of the time, they do not cause serious problems. They usually get better with diet changes, lifestyle changes, and other home treatments. Follow these instructions at home: Eating and drinking  Eat foods that have fiber, such as whole grains, beans, nuts, fruits, and vegetables. Ask your doctor about taking products that have added fiber (fibersupplements).  Drink enough fluid to keep your pee (urine) clear or pale yellow. For Pain and Swelling  Take a warm-water bath (sitz bath) for 20 minutes to ease pain. Do this 3-4 times a day.  If directed, put ice on the painful area. It may be helpful to use ice between your warm baths. ? Put ice in a plastic bag. ? Place a towel between your skin and the bag. ? Leave the ice on for 20 minutes, 2-3 times a day. General instructions  Take over-the-counter and prescription medicines only as told by your doctor. ? Medicated creams and medicines that are inserted into the anus (suppositories) may be used or applied as told.  Exercise often.  Go to the bathroom when you have the urge to poop (to have a bowel movement). Do not wait.  Avoid pushing too hard (straining) when you poop.  Keep the butt area dry and clean. Use wet toilet paper or moist paper towels.  Do not sit on the toilet for a long time. Contact a doctor if:  You have any of these: ? Pain and swelling that do not get better with treatment or medicine. ? Bleeding that will not stop. ? Trouble pooping or you cannot poop. ? Pain or swelling outside the area of the hemorrhoids. This information is not intended to replace advice given to you by your health care provider. Make sure you discuss any questions you have with your health care provider.    Colon Polyps Polyps are tissue growths inside the body. Polyps can grow in many places, including the large intestine (colon). A polyp may be a round bump or a mushroom-shaped growth. You could have one polyp or  several. Most colon polyps are noncancerous (benign). However, some colon polyps can become cancerous over time. What are the causes? The exact cause of colon polyps is not known. What increases the risk? This condition is more likely to develop in people who:  Have a family history of colon cancer or colon polyps.  Are older than 78 or older than 45 if they are African American.  Have inflammatory bowel disease, such as ulcerative colitis or Crohn disease.  Are overweight.  Smoke cigarettes.  Do not get enough exercise.  Drink too much alcohol.  Eat a diet that is: ? High in fat and red meat. ? Low in fiber.  Had childhood cancer that was treated with abdominal radiation.  What are the signs or symptoms? Most polyps do not cause symptoms. If you have symptoms, they may include:  Blood coming from your rectum when having a bowel movement.  Blood in your stool.The stool may look  dark red or black.  A change in bowel habits, such as constipation or diarrhea.  How is this diagnosed? This condition is diagnosed with a colonoscopy. This is a procedure that uses a lighted, flexible scope to look at the inside of your colon. How is this treated? Treatment for this condition involves removing any polyps that are found. Those polyps will then be tested for cancer. If cancer is found, your health care provider will talk to you about options for colon cancer treatment. Follow these instructions at home: Diet  Eat plenty of fiber, such as fruits, vegetables, and whole grains.  Eat foods that are high in calcium and vitamin D, such as milk, cheese, yogurt, eggs, liver, fish, and broccoli.  Limit foods high in fat, red meats, and processed meats, such as hot dogs, sausage, bacon, and lunch meats.  Maintain a healthy weight, or lose weight if recommended by your health care provider. General instructions  Do not smoke cigarettes.  Do not drink alcohol excessively.  Keep all  follow-up visits as told by your health care provider. This is important. This includes keeping regularly scheduled colonoscopies. Talk to your health care provider about when you need a colonoscopy.  Exercise every day or as told by your health care provider. Contact a health care provider if:  You have new or worsening bleeding during a bowel movement.  You have new or increased blood in your stool.  You have a change in bowel habits.  You unexpectedly lose weight. This information is not intended to replace advice given to you by your health care provider. Make sure you discuss any questions you have with your health care provider.   Diverticulosis Diverticulosis is a condition that develops when small pouches (diverticula) form in the wall of the large intestine (colon). The colon is where water is absorbed and stool is formed. The pouches form when the inside layer of the colon pushes through weak spots in the outer layers of the colon. You may have a few pouches or many of them. What are the causes? The cause of this condition is not known. What increases the risk? The following factors may make you more likely to develop this condition:  Being older than age 32. Your risk for this condition increases with age. Diverticulosis is rare among people younger than age 24. By age 36, many people have it.  Eating a low-fiber diet.  Having frequent constipation.  Being overweight.  Not getting enough exercise.  Smoking.  Taking over-the-counter pain medicines, like aspirin and ibuprofen.  Having a family history of diverticulosis.  What are the signs or symptoms? In most people, there are no symptoms of this condition. If you do have symptoms, they may include:  Bloating.  Cramps in the abdomen.  Constipation or diarrhea.  Pain in the lower left side of the abdomen.  How is this diagnosed? This condition is most often diagnosed during an exam for other colon problems.  Because diverticulosis usually has no symptoms, it often cannot be diagnosed independently. This condition may be diagnosed by:  Using a flexible scope to examine the colon (colonoscopy).  Taking an X-ray of the colon after dye has been put into the colon (barium enema).  Doing a CT scan.  How is this treated? You may not need treatment for this condition if you have never developed an infection related to diverticulosis. If you have had an infection before, treatment may include:  Eating a high-fiber diet. This may  include eating more fruits, vegetables, and grains.  Taking a fiber supplement.  Taking a live bacteria supplement (probiotic).  Taking medicine to relax your colon.  Taking antibiotic medicines.  Follow these instructions at home:  Drink 6-8 glasses of water or more each day to prevent constipation.  Try not to strain when you have a bowel movement.  If you have had an infection before: ? Eat more fiber as directed by your health care provider or your diet and nutrition specialist (dietitian). ? Take a fiber supplement or probiotic, if your health care provider approves.  Take over-the-counter and prescription medicines only as told by your health care provider.  If you were prescribed an antibiotic, take it as told by your health care provider. Do not stop taking the antibiotic even if you start to feel better.  Keep all follow-up visits as told by your health care provider. This is important. Contact a health care provider if:  You have pain in your abdomen.  You have bloating.  You have cramps.  You have not had a bowel movement in 3 days. Get help right away if:  Your pain gets worse.  Your bloating becomes very bad.  You have a fever or chills, and your symptoms suddenly get worse.  You vomit.  You have bowel movements that are bloody or black.  You have bleeding from your rectum. Summary  Diverticulosis is a condition that develops when  small pouches (diverticula) form in the wall of the large intestine (colon).  You may have a few pouches or many of them.  This condition is most often diagnosed during an exam for other colon problems.  If you have had an infection related to diverticulosis, treatment may include increasing the fiber in your diet, taking supplements, or taking medicines. This information is not intended to replace advice given to you by your health care provider. Make sure you discuss any questions you have with your health care provider.

## 2017-10-11 NOTE — Anesthesia Preprocedure Evaluation (Signed)
Anesthesia Evaluation  Patient identified by MRN, date of birth, ID band Patient awake    Reviewed: Allergy & Precautions, H&P , NPO status , Patient's Chart, lab work & pertinent test results, reviewed documented beta blocker date and time   Airway Mallampati: II  TM Distance: >3 FB Neck ROM: full    Dental no notable dental hx.    Pulmonary neg pulmonary ROS, former smoker,    Pulmonary exam normal breath sounds clear to auscultation       Cardiovascular Exercise Tolerance: Good hypertension, negative cardio ROS   Rhythm:regular Rate:Normal     Neuro/Psych negative neurological ROS  negative psych ROS   GI/Hepatic negative GI ROS, Neg liver ROS, GERD  ,  Endo/Other  negative endocrine ROS  Renal/GU negative Renal ROS  negative genitourinary   Musculoskeletal   Abdominal   Peds  Hematology negative hematology ROS (+)   Anesthesia Other Findings 12 lead with NSR, nonspecific T changes Otherwise denies any sig PMH  Reproductive/Obstetrics negative OB ROS                             Anesthesia Physical Anesthesia Plan  ASA: II  Anesthesia Plan: MAC   Post-op Pain Management:    Induction:   PONV Risk Score and Plan:   Airway Management Planned:   Additional Equipment:   Intra-op Plan:   Post-operative Plan:   Informed Consent: I have reviewed the patients History and Physical, chart, labs and discussed the procedure including the risks, benefits and alternatives for the proposed anesthesia with the patient or authorized representative who has indicated his/her understanding and acceptance.   Dental Advisory Given  Plan Discussed with: CRNA and Anesthesiologist  Anesthesia Plan Comments:         Anesthesia Quick Evaluation

## 2017-10-11 NOTE — Transfer of Care (Signed)
Immediate Anesthesia Transfer of Care Note  Patient: Megan Velasquez  Procedure(s) Performed: COLONOSCOPY WITH PROPOFOL (N/A ) POLYPECTOMY  Patient Location: PACU  Anesthesia Type:MAC  Level of Consciousness: awake, alert , oriented and patient cooperative  Airway & Oxygen Therapy: Patient Spontanous Breathing  Post-op Assessment: Report given to RN, Post -op Vital signs reviewed and stable and Patient moving all extremities X 4  Post vital signs: Reviewed and stable  Last Vitals:  Vitals Value Taken Time  BP    Temp    Pulse 44 10/11/2017  2:37 PM  Resp    SpO2 85 % 10/11/2017  2:37 PM  Vitals shown include unvalidated device data.  Last Pain:  Vitals:   10/11/17 1153  PainSc: 0-No pain         Complications: No apparent anesthesia complications

## 2017-10-11 NOTE — Op Note (Signed)
[facility] Patient Name: Megan Velasquez Procedure Date: 10/11/2017 2:03 PM MRN: [id number] Date of Birth: [birth year] Attending MD: Norvel Richards , MD CSN: [id number] Age: 57 Admit Type: Outpatient Procedure:                Colonoscopy Indications:              High risk colon cancer surveillance: Personal                            history of colonic polyps Providers:                Norvel Richards, MD, Otis Peak B. Sharon Seller, RN,                            Nelma Rothman, Technician Referring MD:              Medicines:                Propofol per Anesthesia Complications:            No immediate complications. Estimated Blood Loss:     Estimated blood loss was minimal. Procedure:                Pre-Anesthesia Assessment:                           - Prior to the procedure, a History and Physical                            was performed, and patient medications and                            allergies were reviewed. The patient's tolerance of                            previous anesthesia was also reviewed. The risks                            and benefits of the procedure and the sedation                            options and risks were discussed with the patient.                            All questions were answered, and informed consent                            was obtained. ASA Grade Assessment: II - A patient                            with mild systemic disease. After reviewing the                            risks and benefits, the patient was deemed in  satisfactory condition to undergo the procedure.                           After obtaining informed consent, the colonoscope                            was passed under direct vision. Throughout the                            procedure, the patient's blood pressure, pulse, and                            oxygen saturations were monitored continuously. The                            EC-[id number]  ([id number]) scope was introduced through                            the and advanced to the the cecum, identified by                            appendiceal orifice and ileocecal valve. The                            EC-[id number] ([id number]) scope was introduced through                            the and advanced to the. The colonoscopy was                            performed without difficulty. The patient tolerated                            the procedure well. The quality of the bowel                            preparation was adequate. The ileocecal valve,                            appendiceal orifice, and rectum were photographed.                            The entire colon was visualized. Scope In: 2:12:15 PM Scope Out: 2:32:50 PM Scope Withdrawal Time: 0 hours 13 minutes 57 seconds  Total Procedure Duration: 0 hours 20 minutes 35 seconds  Findings:      The perianal and digital rectal examinations were normal.      Two sessile polyps were found in the descending colon. The polyps were 4       to 7 mm in size. These polyps were removed with a cold snare. Resection       and retrieval were complete. Estimated blood loss was minimal.      Scattered medium-mouthed diverticula were found in the sigmoid colon and       descending colon.      Non-bleeding  internal hemorrhoids were found during retroflexion. The       hemorrhoids were moderate, medium-sized and Grade II (internal       hemorrhoids that prolapse but reduce spontaneously).      The exam was otherwise without abnormality on direct and retroflexion       views. Impression:               - Two 4 to 7 mm polyps in the descending colon,                            removed with a cold snare. Resected and retrieved.                           - Diverticulosis in the sigmoid colon and in the                            descending colon.                           - Non-bleeding internal hemorrhoids.                           - The  examination was otherwise normal on direct                            and retroflexion views. Moderate Sedation:      Moderate (conscious) sedation was personally administered by an       anesthesia professional. The following parameters were monitored: oxygen       saturation, heart rate, blood pressure, respiratory rate, EKG, adequacy       of pulmonary ventilation, and response to care. Total physician       intraservice time was 25 minutes. Recommendation:           - Patient has a contact number available for                            emergencies. The signs and symptoms of potential                            delayed complications were discussed with the                            patient. Return to normal activities tomorrow.                            Written discharge instructions were provided to the                            patient.                           - Resume previous diet.                           - Continue present medications. Patient with  symptomatic hemorrhoids. Appears to be a good                            candidate for in office hemorrhoid banding. Will                            provide pamphlet on hemorrhoid banding.                           - Await pathology results.                           - Repeat colonoscopy date to be determined after                            pending pathology results are reviewed for                            surveillance based on pathology results.                           - Return to GI office in 6 weeks. Procedure Code(s):        --- Professional ---                           [phone], Colonoscopy, flexible; with removal of                            tumor(s), polyp(s), or other lesion(s) by snare                            technique Diagnosis Code(s):        --- Professional ---                           Z86.010, Personal history of colonic polyps                           D12.4, Benign neoplasm  of descending colon                           K64.1, Second degree hemorrhoids                           K57.30, Diverticulosis of large intestine without                            perforation or abscess without bleeding CPT copyright 2017 American Medical Association. All rights reserved. The codes documented in this report are preliminary and upon coder review may  be revised to meet current compliance requirements. [name]. Karlina Suares, MD Norvel Richards, MD 10/11/2017 2:38:13 PM This report has been signed electronically. Number of Addenda: 0

## 2017-10-11 NOTE — Anesthesia Postprocedure Evaluation (Signed)
Anesthesia Post Note  Patient: Megan Velasquez  Procedure(s) Performed: COLONOSCOPY WITH PROPOFOL (N/A ) POLYPECTOMY  Anesthesia Type: MAC     Last Vitals:  Vitals:   10/11/17 1153 10/11/17 1437  BP: 125/77 (!) 82/50  Pulse: 88 64  Resp: 18 12  Temp: 36.6 C 36.7 C  SpO2: 97% 99%    Last Pain:  Vitals:   10/11/17 1437  PainSc: 0-No pain                 Joy Haegele E Flossie Buffy

## 2017-10-15 ENCOUNTER — Encounter (HOSPITAL_COMMUNITY): Payer: Self-pay | Admitting: Internal Medicine

## 2017-10-15 ENCOUNTER — Encounter: Payer: Self-pay | Admitting: Internal Medicine

## 2017-12-03 ENCOUNTER — Telehealth: Payer: Self-pay | Admitting: Internal Medicine

## 2017-12-03 NOTE — Telephone Encounter (Signed)
Pt said she hasn't heard about her procedure results. Please call  [phone]

## 2017-12-03 NOTE — Telephone Encounter (Signed)
Pt notified of results. Offered to resend letter to pt, pt declined and will f/u at next apt.

## 2017-12-08 ENCOUNTER — Ambulatory Visit: Payer: BLUE CROSS/BLUE SHIELD | Admitting: Gastroenterology

## 2018-03-11 ENCOUNTER — Ambulatory Visit: Payer: BLUE CROSS/BLUE SHIELD | Admitting: Gastroenterology

## 2018-03-11 ENCOUNTER — Telehealth: Payer: Self-pay | Admitting: Internal Medicine

## 2018-03-11 ENCOUNTER — Encounter: Payer: Self-pay | Admitting: Internal Medicine

## 2018-03-11 NOTE — Telephone Encounter (Signed)
Patient was a no show and letter sent  °

## 2020-03-04 ENCOUNTER — Ambulatory Visit: Payer: BC Managed Care – PPO | Admitting: Cardiology

## 2020-04-09 ENCOUNTER — Telehealth: Payer: Self-pay | Admitting: Radiation Oncology

## 2020-04-09 NOTE — Telephone Encounter (Signed)
Patient husband, Alecia Lemming, has called and left messages for our nurse, Reinaldo Berber, RN to get a consult with Dr. Margaretmary Dys. I've talked with Marcello Fennel, PA and we received a referral from pt Med Onc, @ [facility]-Dr. Lucienne Minks. I've also talked to Sue Lush in Med Onc-Dr. Dorsey's office. Ashlyn advised that Dr. Leonides Schanz would want to see patient for initial contact and establish dx here. We would treat patient with radiation if pt presented with bony lesions. We would get our direction/referral from Med Onc. Called and left voicemail for patient to call back.

## 2020-04-10 ENCOUNTER — Telehealth: Payer: Self-pay | Admitting: Hematology

## 2020-04-10 NOTE — Telephone Encounter (Signed)
Received a new pt referral from [facility] for a 2nd opinion for multiple myeloma. Megan Velasquez has been scheduled to see Dr. Irene Limbo on 1/11 at 11am. Appt date and time has been given to her husband. Aware to arrive 20 minutes early.

## 2020-04-16 ENCOUNTER — Inpatient Hospital Stay: Payer: BC Managed Care – PPO

## 2020-04-16 ENCOUNTER — Inpatient Hospital Stay: Payer: BC Managed Care – PPO | Attending: Hematology | Admitting: Hematology

## 2020-04-16 ENCOUNTER — Other Ambulatory Visit: Payer: Self-pay

## 2020-04-16 VITALS — BP 136/65 | HR 73 | Temp 97.8°F | Resp 20 | Ht 65.0 in | Wt 140.4 lb

## 2020-04-16 DIAGNOSIS — Z87891 Personal history of nicotine dependence: Secondary | ICD-10-CM | POA: Diagnosis not present

## 2020-04-16 DIAGNOSIS — D649 Anemia, unspecified: Secondary | ICD-10-CM

## 2020-04-16 DIAGNOSIS — Z801 Family history of malignant neoplasm of trachea, bronchus and lung: Secondary | ICD-10-CM | POA: Insufficient documentation

## 2020-04-16 DIAGNOSIS — I10 Essential (primary) hypertension: Secondary | ICD-10-CM | POA: Diagnosis not present

## 2020-04-16 DIAGNOSIS — C9000 Multiple myeloma not having achieved remission: Secondary | ICD-10-CM

## 2020-04-16 DIAGNOSIS — E538 Deficiency of other specified B group vitamins: Secondary | ICD-10-CM | POA: Diagnosis not present

## 2020-04-16 LAB — CBC WITH DIFFERENTIAL/PLATELET
Abs Immature Granulocytes: 0.01 10*3/uL (ref 0.00–0.07)
Basophils Absolute: 0 10*3/uL (ref 0.0–0.1)
Basophils Relative: 1 %
Eosinophils Absolute: 0.1 10*3/uL (ref 0.0–0.5)
Eosinophils Relative: 2 %
HCT: 30 % — ABNORMAL LOW (ref 36.0–46.0)
Hemoglobin: 9.4 g/dL — ABNORMAL LOW (ref 12.0–15.0)
Immature Granulocytes: 0 %
Lymphocytes Relative: 34 %
Lymphs Abs: 2.6 10*3/uL (ref 0.7–4.0)
MCH: 26.9 pg (ref 26.0–34.0)
MCHC: 31.3 g/dL (ref 30.0–36.0)
MCV: 85.7 fL (ref 80.0–100.0)
Monocytes Absolute: 0.5 10*3/uL (ref 0.1–1.0)
Monocytes Relative: 6 %
Neutro Abs: 4.4 10*3/uL (ref 1.7–7.7)
Neutrophils Relative %: 57 %
Platelets: 221 10*3/uL (ref 150–400)
RBC: 3.5 MIL/uL — ABNORMAL LOW (ref 3.87–5.11)
RDW: 19.1 % — ABNORMAL HIGH (ref 11.5–15.5)
WBC: 7.6 10*3/uL (ref 4.0–10.5)
nRBC: 0 % (ref 0.0–0.2)

## 2020-04-16 LAB — CMP (CANCER CENTER ONLY)
ALT: 12 U/L (ref 0–44)
AST: 17 U/L (ref 15–41)
Albumin: 3 g/dL — ABNORMAL LOW (ref 3.5–5.0)
Alkaline Phosphatase: 56 U/L (ref 38–126)
Anion gap: 11 (ref 5–15)
BUN: 22 mg/dL — ABNORMAL HIGH (ref 6–20)
CO2: 27 mmol/L (ref 22–32)
Calcium: 9.2 mg/dL (ref 8.9–10.3)
Chloride: 103 mmol/L (ref 98–111)
Creatinine: 1.28 mg/dL — ABNORMAL HIGH (ref 0.44–1.00)
GFR, Estimated: 48 mL/min — ABNORMAL LOW (ref >60)
Glucose, Bld: 94 mg/dL (ref 70–99)
Potassium: 4.6 mmol/L (ref 3.5–5.1)
Sodium: 141 mmol/L (ref 135–145)
Total Bilirubin: 0.3 mg/dL (ref 0.3–1.2)
Total Protein: 11 g/dL — ABNORMAL HIGH (ref 6.5–8.1)

## 2020-04-16 LAB — IRON AND TIBC
Iron: 36 ug/dL — ABNORMAL LOW (ref 41–142)
Saturation Ratios: 18 % — ABNORMAL LOW (ref 21–57)
TIBC: 204 ug/dL — ABNORMAL LOW (ref 236–444)
UIBC: 168 ug/dL (ref 120–384)

## 2020-04-16 LAB — VITAMIN D 25 HYDROXY (VIT D DEFICIENCY, FRACTURES): Vit D, 25-Hydroxy: 71.49 ng/mL (ref 30–100)

## 2020-04-16 LAB — LACTATE DEHYDROGENASE: LDH: 71 U/L — ABNORMAL LOW (ref 98–192)

## 2020-04-16 LAB — SAMPLE TO BLOOD BANK

## 2020-04-16 LAB — FERRITIN: Ferritin: 145 ng/mL (ref 11–307)

## 2020-04-16 LAB — VITAMIN B12: Vitamin B-12: 229 pg/mL (ref 180–914)

## 2020-04-16 LAB — SEDIMENTATION RATE: Sed Rate: 137 mm/hr — ABNORMAL HIGH (ref 0–22)

## 2020-04-16 NOTE — Patient Instructions (Signed)
Thank you for choosing [facility] to provide your oncology and hematology care.   Should you have questions after your visit to the [facility] ([facility]), please contact this office at [phone] between 8:30 AM and 4:30 PM.  Voice mails left after 4:00 PM may not be returned until the following business day.  Calls received after 4:30 PM will be answered by an off-site Nurse Triage Line.    Prescription Refills:  Please have your pharmacy contact us directly for most prescription requests.  Contact the office directly for refills of narcotics (pain medications). Allow 48-72 hours for refills.  Appointments: Please contact the [facility] scheduling department [phone] for questions regarding [facility] appointment scheduling.  Contact the schedulers with any scheduling changes so that your appointment can be rescheduled in a timely manner.   Central Scheduling for [place] [phone] - Call to schedule procedures such as PET scans, CT scans, MRI, Ultrasound, etc.  To afford each patient quality time with our providers, please arrive 30 minutes before your scheduled appointment time.  If you arrive late for your appointment, you may be asked to reschedule.  We strive to give you quality time with our providers, and arriving late affects you and other patients whose appointments are after yours. If you are a no show for multiple scheduled visits, you may be dismissed from the clinic at the providers discretion.     Resources: [facility] Social Workers [phone] for additional information on assistance programs or assistance connecting with community support programs   [place] DSS  [phone]: Information regarding food stamps, Medicaid, and utility assistance [place] [phone]   [place] [employer]'s shared-ride transportation service for eligible riders who have a disability that prevents them from riding the fixed route bus.   Medicare  Rights Center 800-333-4114 Helps people with Medicare understand their rights and benefits, navigate the Medicare system, and secure the quality healthcare they deserve American [employer] 800-227-2345 Assists patients locate various types of support and financial assistance Cancer Care: 1-800-813-HOPE (4673) Provides financial assistance, online support groups, medication/co-pay assistance.   Transportation Assistance for appointments at [facility]: Transportation Coordinator [phone]  Again, thank you for choosing  Cancer Center for your care.       

## 2020-04-16 NOTE — Progress Notes (Signed)
HEMATOLOGY/ONCOLOGY CONSULTATION NOTE  Date of Service: 04/16/2020  Patient Care Team: [name], MD as PCP - General (Family Medicine) [name], [name], MD as PCP - Cardiology (Cardiology) [name] [name], MD as Consulting Physician (Gastroenterology)  CHIEF COMPLAINTS/PURPOSE OF CONSULTATION:  Multiple Myeloma  HISTORY OF PRESENTING ILLNESS:   Megan Velasquez is a wonderful 60 y.o. female who has been referred to [place] for evaluation and management of multiple myeloma. Pt is accompanied today by her husband for this visit.   Patient was referred to [place] for second opinion regarding management of newly diagnosed multiple myeloma by Dr. Laurena Slimmer MD.  Patient had been referred to to [facility] cancer center by her primary care physician for anemia and elevated serum protein levels.  She also had some increasing fatigue and tingling in her hands and feet.  Noted primarily in the right wrist and right ankle.  B12 levels were noted to be borderline low.  She had a work-up including labs which showed hemoglobin of 9.7 with an MCV of 86.9 WBC count of 6.95k and platelets of 165k. CMP showed a creatinine of 1.06 calcium level of 8.9 total protein of 10.2 with an albumin of 2.7 normal liver function tests and other electrolytes. Serum protein electrophoresis with an M spike of 4.3 with an additional M spike of 0.2 g/dL.  IgA quantitative more than 6400. B12 levels 276, homocystine 15.9 Serum folate 26.5 LDH 96 Beta-2 microglobulin of 2.5  Serum kappa lambda free light chains showed free kappa light chains of 181, free lambda light chains of 3.6 and a free kappa lambda ratio of 50.3.  24-hour UPEP showed no M spike and a total protein of 5.7 mg/dL.  Bone Survey completed on 03/08/2020 with results revealing "No evidence of lytic nor blastic lesions within the appendicular or axial skeleton. Degenerative changes as described above."   On review of systems, pt reports some mild fatigue.  No  focal bone pains.  No chest pain.  No shortness of breath. No evidence of GI bleeding no hematuria no nosebleeds or gum bleeds. No other acute new focal symptoms. Denies any history of known cardiopulmonary disease strokes or heart attacks.   MEDICAL HISTORY:  Past Medical History:  Diagnosis Date   GERD (gastroesophageal reflux disease)    History of kidney stones    HTN (hypertension)     SURGICAL HISTORY: Past Surgical History:  Procedure Laterality Date   CHOLECYSTECTOMY     COLONOSCOPY  01/2015   Dr. Britta Mccreedy: Mild diverticulosis, sessile polyp ranging 3 to 5 mm removed from the proximal transverse colon, semi-pedunculated polyp 5 to 9 mm in size removed from the sigmoid colon.  Sigmoid colon polyp was serrated adenoma, transverse colon polyp was adenomatous.  Patient was told to have another colonoscopy in 3 years.   COLONOSCOPY WITH PROPOFOL N/A 10/11/2017   Procedure: COLONOSCOPY WITH PROPOFOL;  Surgeon: Daneil Dolin, MD;  [facility];  Service: Endoscopy;  Laterality: N/A;  1:15pm   POLYPECTOMY  10/11/2017   Procedure: POLYPECTOMY;  Surgeon: Daneil Dolin, MD;  Location: [facility];  Service: Endoscopy;;  descending colon polyps cs times 2    SOCIAL HISTORY: Social History   Socioeconomic History   Marital status: Married    Spouse name: Not on file   Number of children: Not on file   Years of education: Not on file   Highest education level: Not on file  Occupational History   Not on  file  Tobacco Use   Smoking status: Former Smoker    Packs/day: 1.00    Years: 28.00    Pack years: 28.00    Types: Cigarettes    Quit date: 10/08/2013    Years since quitting: 6.5   Smokeless tobacco: Never Used  Vaping Use   Vaping Use: Never used  Substance and Sexual Activity   Alcohol use: Yes    Comment: 2 beers per year   Drug use: Never   Sexual activity: Yes    Birth control/protection: Post-menopausal  Other Topics Concern   Not on  file  Social History Narrative   Not on file   Social Determinants of Health   Financial Resource Strain: Not on file  Food Insecurity: Not on file  Transportation Needs: Not on file  Physical Activity: Not on file  Stress: Not on file  Social Connections: Not on file  Intimate Partner Violence: Not on file    FAMILY HISTORY: Family History  Problem Relation Age of Onset   Heart disease Mother    Diabetes Mother    Lung cancer Father    COPD Father    Colon cancer Neg Hx     ALLERGIES:  has No Known Allergies.  MEDICATIONS:  Current Outpatient Medications  Medication Sig Dispense Refill   acetaminophen (TYLENOL) 500 MG tablet Take 1,000 mg by mouth daily as needed for headache.     aspirin EC 81 MG tablet Take 81 mg by mouth daily.     Carboxymethylcellul-Glycerin (LUBRICATING EYE DROPS OP) Place 1 drop into the right eye 4 (four) times daily. Use for 3 days prior to surgery     Cholecalciferol (VITAMIN D3) 5000 units CAPS Take 5,000 Units by mouth daily.     esomeprazole (NEXIUM) 20 MG capsule Take 20-40 mg by mouth daily as needed (acid reflux).      lisinopril-hydrochlorothiazide (PRINZIDE,ZESTORETIC) 10-12.5 MG tablet Take 1 tablet by mouth daily.     loratadine (CLARITIN) 10 MG tablet Take 10 mg by mouth daily as needed for allergies.     vitamin C (ASCORBIC ACID) 500 MG tablet Take 500 mg by mouth daily.     No current facility-administered medications for this visit.    REVIEW OF SYSTEMS:    10 Point review of Systems was done is negative except as noted above.  PHYSICAL EXAMINATION: ECOG PERFORMANCE STATUS: 1 - Symptomatic but completely ambulatory  . Vitals:   04/16/20 1100  BP: 136/65  Pulse: 73  Resp: 20  Temp: 97.8 F (36.6 C)  SpO2: 100%   Filed Weights   04/16/20 1100  Weight: 140 lb 6.4 oz (63.7 kg)   .Body mass index is 23.36 kg/m.   NAD GENERAL:alert, in no acute distress and comfortable SKIN: no acute rashes, no  significant lesions EYES: conjunctiva are pink and non-injected, sclera anicteric OROPHARYNX: MMM, no exudates, no oropharyngeal erythema or ulceration NECK: supple, no JVD LYMPH:  no palpable lymphadenopathy in the cervical, axillary or inguinal regions LUNGS: clear to auscultation b/l with normal respiratory effort HEART: regular rate & rhythm ABDOMEN:  normoactive bowel sounds , non tender, not distended. Extremity: no pedal edema PSYCH: alert & oriented x 3 with fluent speech NEURO: no focal motor/sensory deficits  LABORATORY DATA:  I have reviewed the data as listed  . CBC Latest Ref Rng & Units 04/16/2020 10/08/2017  WBC 4.0 - 10.5 K/uL 7.6 7.2  Hemoglobin 12.0 - 15.0 g/dL 9.4(L) 12.4  Hematocrit 36.0 - 46.0 %  30.0(L) 37.5  Platelets 150 - 400 K/uL 221 216    . CMP Latest Ref Rng & Units 04/16/2020 10/08/2017  Glucose 70 - 99 mg/dL 94 86  BUN 6 - 20 mg/dL 22(H) 18  Creatinine 0.44 - 1.00 mg/dL 1.28(H) 0.96  Sodium 135 - 145 mmol/L 141 140  Potassium 3.5 - 5.1 mmol/L 4.6 3.6  Chloride 98 - 111 mmol/L 103 101  CO2 22 - 32 mmol/L 27 28  Calcium 8.9 - 10.3 mg/dL 9.2 9.1  Total Protein 6.5 - 8.1 g/dL 11.0(H) -  Total Bilirubin 0.3 - 1.2 mg/dL 0.3 -  Alkaline Phos 38 - 126 U/L 56 -  AST 15 - 41 U/L 17 -  ALT 0 - 44 U/L 12 -     Most recent lab results (03/15/2020) of CBC is as follows: all values are WNL except for RBC at 3.66, Hgb at 9.7, HCT at 31.8, MCHC at 30.5, RDW Standard Deviation 58.7, Red Cell Distribution Width At 18.6,  BUN at 19, Creatinine at 1.06, Total Protein at 10.2, Albumin at 2.7, AST at 14,. 03/13/2020 UPEP shows no M-Spike, all values are WNL 03/06/2020 SPEP shows Total Protein at 10.1, Beta Globulin at 5.2, M-Spike at 4.3, Total Globulin at 6.4, A/G Ratio at 0.6 03/06/2020 Free Kappa Light Chains at 181.1, Free Lambda Light Chains Serum at 3.6, K/L ratio at 50.31 03/06/2020 Beta-2 Microglobulin at 2.5   RADIOGRAPHIC STUDIES: I have personally reviewed  the radiological images as listed and agreed with the findings in the report. No results found.  ASSESSMENT & PLAN:   60 year old female with  #1 Newly diagnosed multiple myeloma -likely IgA kappa.  Presenting with anemia hemoglobin today 9.4. Creatinine 1.28 No hypercalcemia Bone survey with no obvious skeletal lesions. Baseline M spike of 4.3 g/dL. Beta-2 Microglobulin at 2.5 PLAN: -I had a detailed and extended discussion with the patient and discussed all her available labs-blood test, urine tests. -We discussed her outside bone survey results. -We discussed that in the setting of significant monoclonal paraproteinemia and her developing anemia this likely represents active multiple myeloma. -We would want to get some additional lab testing which has been ordered for today. -24-hour UPEP has already been done and reviewed at outside facility. -Would recommend getting a PET CT scan for a closer look to evaluate for bone involvement. -Patient was counseled and is agreeable to doing a bone marrow aspiration and biopsy this has been ordered through interventional radiology. -If there is delay on the bone marrow examination or work-up in the context of the current pandemic we would likely start her on weekly dexamethasone given she is high risk for developing hyperviscosity syndrome.   FOLLOW UP: Labs today CT bone marrow aspiration and biopsy (patient prefers early morning appointment and notes she really prefers 11/14 since she is off) PET/CT in 2 week Phone visit/or inperson visit (patients preference) with Dr Irene Limbo in 2weeks   All of the patients questions were answered with apparent satisfaction. The patient knows to call the clinic with any problems, questions or concerns.  I spent 45 minutes counseling the patient face to face. The total time spent in the appointment was 70 minutes and more than 50% was on counseling and direct patient cares.    Sullivan Lone MD [place] AAHIVMS [facility]  [facility] [place] Hematology/Oncology Physician [facility]  (Office):       [phone] (Work cell):  [phone] (Fax):           [phone]  04/16/2020 2:23 AM  I, Yevette Edwards, am acting as a scribe for Dr. Sullivan Lone.   .I have reviewed the above documentation for accuracy and completeness, and I agree with the above. Brunetta Genera MD

## 2020-04-17 LAB — KAPPA/LAMBDA LIGHT CHAINS
Kappa free light chain: 115.2 mg/L — ABNORMAL HIGH (ref 3.3–19.4)
Kappa, lambda light chain ratio: 32.91 — ABNORMAL HIGH (ref 0.26–1.65)
Lambda free light chains: 3.5 mg/L — ABNORMAL LOW (ref 5.7–26.3)

## 2020-04-17 LAB — BETA 2 MICROGLOBULIN, SERUM: Beta-2 Microglobulin: 2 mg/L (ref 0.6–2.4)

## 2020-04-18 LAB — MULTIPLE MYELOMA PANEL, SERUM
Albumin SerPl Elph-Mcnc: 3.6 g/dL (ref 2.9–4.4)
Albumin/Glob SerPl: 0.6 — ABNORMAL LOW (ref 0.7–1.7)
Alpha 1: 0.1 g/dL (ref 0.0–0.4)
Alpha2 Glob SerPl Elph-Mcnc: 0.4 g/dL (ref 0.4–1.0)
B-Globulin SerPl Elph-Mcnc: 6 g/dL — ABNORMAL HIGH (ref 0.7–1.3)
Gamma Glob SerPl Elph-Mcnc: 0.2 g/dL — ABNORMAL LOW (ref 0.4–1.8)
Globulin, Total: 6.8 g/dL — ABNORMAL HIGH (ref 2.2–3.9)
IgA: >6400 mg/dL — ABNORMAL HIGH (ref 87–352)
IgG (Immunoglobin G), Serum: 158 mg/dL — ABNORMAL LOW (ref 586–1602)
IgM (Immunoglobulin M), Srm: 9 mg/dL — ABNORMAL LOW (ref 26–217)
M Protein SerPl Elph-Mcnc: 4.7 g/dL — ABNORMAL HIGH
Total Protein ELP: 10.4 g/dL — ABNORMAL HIGH (ref 6.0–8.5)

## 2020-04-25 ENCOUNTER — Telehealth: Payer: Self-pay

## 2020-04-25 ENCOUNTER — Other Ambulatory Visit: Payer: Self-pay | Admitting: Hematology

## 2020-04-25 ENCOUNTER — Other Ambulatory Visit: Payer: Self-pay

## 2020-04-25 ENCOUNTER — Telehealth: Payer: Self-pay | Admitting: Hematology

## 2020-04-25 ENCOUNTER — Telehealth (HOSPITAL_COMMUNITY): Payer: Self-pay

## 2020-04-25 MED ORDER — VITAMIN B12 3000 MCG/ML SL LIQD: 3000.0000 ug | Freq: Every day | SUBLINGUAL | 1 refills | Status: DC

## 2020-04-25 MED ORDER — DEXAMETHASONE 4 MG PO TABS: 20.0000 mg | ORAL_TABLET | ORAL | 2 refills | Status: DC

## 2020-04-25 MED ORDER — B COMPLEX VITAMINS PO CAPS: 1.0000 | ORAL_CAPSULE | Freq: Every day | ORAL | 3 refills | Status: AC

## 2020-04-25 NOTE — Telephone Encounter (Signed)
Megan Velasquez has been rescheduled to see Dr. Irene Limbo on 2/2 at 320pm. Per sch msg pt needed to be rescheduled after her PET. I cld and lft the new appt date and time on the pt's vm.

## 2020-04-25 NOTE — Telephone Encounter (Signed)
Per Dr. Irene Limbo contacted patient to let her know:  Plz let patient know her labs show some increase in M protein so we would recommend starting dexamethasone 20 mg once weekly in the morning with breakfast pending remaining work-up to try to knock back her myeloma. Prescription has been sent to her pharmacy. Also her B12 levels are low and we would like to have her increase her daily B12 from 2000 to 3000 mcg daily and also add a vitamin B complex to avoid neuropathy. Patient is at work and patient's husband was made aware of Dr. Grier Mitts message. He verbalized understanding. Patient is scheduled for PET scan on 05/07/2020 at 2:00 pm with arrival at 1:30 pm. Patient's husband made aware and knows patient is NPO 6 hours prior to the scan and no carbs or sweets 24 hours prior to the scan. Patient's husband verbalized understanding of all instructions and is aware to expect a call from Central Scheduling about getting the CT biopsy scheduled.

## 2020-04-26 ENCOUNTER — Ambulatory Visit: Payer: BC Managed Care – PPO | Admitting: Cardiology

## 2020-04-26 ENCOUNTER — Other Ambulatory Visit: Payer: Self-pay | Admitting: Hematology

## 2020-04-26 ENCOUNTER — Telehealth: Payer: Self-pay | Admitting: Hematology

## 2020-04-26 ENCOUNTER — Telehealth (HOSPITAL_COMMUNITY): Payer: Self-pay

## 2020-04-26 NOTE — Telephone Encounter (Signed)
Called patient regarding upcoming appointment, left a voicemail. 

## 2020-04-29 ENCOUNTER — Telehealth: Payer: Self-pay | Admitting: *Deleted

## 2020-04-29 NOTE — Telephone Encounter (Signed)
Husband called to clarify Dr. Grier Mitts recommendation for B complex and B12 for patient. Advised him that per earlier message, she is to  increase her daily B12 from 2000 to 3000 mcg daily and also add a vitamin B complex to avoid neuropathy. He verbalized understanding. He stated she has the B complex and she will start the B12 as soon as he gets it today.

## 2020-04-30 ENCOUNTER — Ambulatory Visit: Payer: BC Managed Care – PPO | Admitting: Hematology

## 2020-05-07 ENCOUNTER — Ambulatory Visit (HOSPITAL_COMMUNITY): Admission: RE | Admit: 2020-05-07 | Payer: BC Managed Care – PPO | Source: Ambulatory Visit

## 2020-05-08 ENCOUNTER — Telehealth: Payer: Self-pay | Admitting: Hematology

## 2020-05-08 ENCOUNTER — Ambulatory Visit: Payer: BC Managed Care – PPO | Admitting: Hematology

## 2020-05-08 NOTE — Telephone Encounter (Signed)
Called pt per 2/2 sch smg - no answer left message for pt to call back to reschedule apt.

## 2020-05-09 ENCOUNTER — Ambulatory Visit (HOSPITAL_COMMUNITY): Payer: BC Managed Care – PPO

## 2020-05-09 ENCOUNTER — Telehealth: Payer: Self-pay | Admitting: *Deleted

## 2020-05-09 NOTE — Telephone Encounter (Signed)
Returned call to [employer] spouse [name].  Provided fax number near forms coordinator for direct receipt of FMLA reports due within seven days form intermittent leave for [name].  No start date provided.  "I am on vacation now but would like something on hand case I need to be out for my wife.  Damica also needs an intermittent leave for her employer."

## 2020-05-14 ENCOUNTER — Telehealth: Payer: Self-pay | Admitting: Hematology

## 2020-05-14 ENCOUNTER — Telehealth: Payer: Self-pay | Admitting: *Deleted

## 2020-05-14 NOTE — Telephone Encounter (Signed)
Called pt per 2/8 sch msg - no answer. Left message for patient to call back to reschedule appt.

## 2020-05-14 NOTE — Telephone Encounter (Signed)
Patient requested to cancel and reschedule appt with Dr. Irene Limbo tomorrow. She received positive Covid test 2/2 and had to r/s PET and bone marrow biopsy. PET now 2/11 and Bone Marrow Bx 2/14. Dr. Irene Limbo informed.  Per Dr. Irene Limbo - reschedule appt with him for Thursday 2/17 or Friday 2/18. Schedule message sent.

## 2020-05-15 ENCOUNTER — Ambulatory Visit: Payer: BC Managed Care – PPO | Admitting: Hematology

## 2020-05-15 ENCOUNTER — Telehealth: Payer: Self-pay | Admitting: Hematology

## 2020-05-15 NOTE — Telephone Encounter (Signed)
Scheduled appt per 2/8 sch msg - rescheduled appt to fate and time that works best for pt . Pt is aware,.

## 2020-05-17 ENCOUNTER — Other Ambulatory Visit: Payer: Self-pay | Admitting: Student

## 2020-05-17 ENCOUNTER — Other Ambulatory Visit: Payer: Self-pay

## 2020-05-17 ENCOUNTER — Ambulatory Visit (HOSPITAL_COMMUNITY)
Admission: RE | Admit: 2020-05-17 | Discharge: 2020-05-17 | Disposition: A | Discharge status: Home / Self Care | Payer: BC Managed Care – PPO | Source: Ambulatory Visit | Attending: Hematology | Admitting: Hematology

## 2020-05-17 DIAGNOSIS — C9000 Multiple myeloma not having achieved remission: Secondary | ICD-10-CM

## 2020-05-17 LAB — GLUCOSE, CAPILLARY: Glucose-Capillary: 98 mg/dL (ref 70–99)

## 2020-05-17 MED ORDER — FLUDEOXYGLUCOSE F - 18 (FDG) INJECTION
7.3000 | Freq: Once | INTRAVENOUS | Status: AC | PRN
  Administered 2020-05-17: 7 via INTRAVENOUS

## 2020-05-20 ENCOUNTER — Ambulatory Visit (HOSPITAL_COMMUNITY)
Admission: RE | Admit: 2020-05-20 | Discharge: 2020-05-20 | Disposition: A | Discharge status: Home / Self Care | Payer: BC Managed Care – PPO | Source: Ambulatory Visit | Attending: Hematology | Admitting: Hematology

## 2020-05-20 ENCOUNTER — Encounter (HOSPITAL_COMMUNITY): Payer: Self-pay

## 2020-05-20 ENCOUNTER — Other Ambulatory Visit: Payer: Self-pay

## 2020-05-20 DIAGNOSIS — D649 Anemia, unspecified: Secondary | ICD-10-CM | POA: Diagnosis not present

## 2020-05-20 DIAGNOSIS — C9000 Multiple myeloma not having achieved remission: Secondary | ICD-10-CM | POA: Insufficient documentation

## 2020-05-20 LAB — CBC
HCT: 31.3 % — ABNORMAL LOW (ref 36.0–46.0)
Hemoglobin: 9.9 g/dL — ABNORMAL LOW (ref 12.0–15.0)
MCH: 26.9 pg (ref 26.0–34.0)
MCHC: 31.6 g/dL (ref 30.0–36.0)
MCV: 85.1 fL (ref 80.0–100.0)
Platelets: 245 10*3/uL (ref 150–400)
RBC: 3.68 MIL/uL — ABNORMAL LOW (ref 3.87–5.11)
RDW: 18.9 % — ABNORMAL HIGH (ref 11.5–15.5)
WBC: 9.2 10*3/uL (ref 4.0–10.5)
nRBC: 0 % (ref 0.0–0.2)

## 2020-05-20 LAB — PROTIME-INR
INR: 1 (ref 0.8–1.2)
Prothrombin Time: 12.5 seconds (ref 11.4–15.2)

## 2020-05-20 MED ORDER — SODIUM CHLORIDE 0.9 % IV SOLN: INTRAVENOUS | Status: DC

## 2020-05-20 MED ORDER — FENTANYL CITRATE (PF) 100 MCG/2ML IJ SOLN
INTRAMUSCULAR | Status: AC
  Filled 2020-05-20: qty 2

## 2020-05-20 MED ORDER — LIDOCAINE HCL (PF) 1 % IJ SOLN
INTRAMUSCULAR | Status: AC | PRN
  Administered 2020-05-20: 10 mL

## 2020-05-20 MED ORDER — MIDAZOLAM HCL 2 MG/2ML IJ SOLN
INTRAMUSCULAR | Status: AC | PRN
  Administered 2020-05-20 (×2): 1 mg via INTRAVENOUS

## 2020-05-20 MED ORDER — MIDAZOLAM HCL 2 MG/2ML IJ SOLN
INTRAMUSCULAR | Status: AC
  Filled 2020-05-20: qty 4

## 2020-05-20 MED ORDER — FENTANYL CITRATE (PF) 100 MCG/2ML IJ SOLN
INTRAMUSCULAR | Status: AC | PRN
  Administered 2020-05-20 (×2): 50 ug via INTRAVENOUS

## 2020-05-20 NOTE — H&P (Signed)
Chief Complaint: Patient was seen in consultation today for image guided bone marrow aspiration and biopsy at the request of Brunetta Genera  Referring Physician(s): Brunetta Genera  Supervising Physician: Jacqulynn Cadet  Patient Status: [facility] - Out-pt  History of Present Illness: Megan Velasquez is a 60 y.o. female with past medical history of hypertension, and newly diagnosed multiple myeloma.  Patient was originally referred to [facility] center by her primary care physician for anemia and elevated serum protein levels.  Then, patient was referred to [facility] for second opinion regarding management of multiple myeloma.   IR was requested by Dr. Irene Limbo for image guided bone marrow aspiration and biopsy.  Patient laying in bed, states that she is nervous about the procedure. Informed the patient that she will be sedated for the procedure, and our team will make sure that she is comfortable throughout the procedure.   Patient denies fever, chills, shortness of breath, chest pain, cough, abdominal pain, nausea, vomiting, constipation, diarrhea, and back pain.  Patient was tested positive with Covid in January 13, now she is symptom-free.    Past Medical History:  Diagnosis Date  . GERD (gastroesophageal reflux disease)   . History of kidney stones   . HTN (hypertension)     Past Surgical History:  Procedure Laterality Date  . CHOLECYSTECTOMY    . COLONOSCOPY  01/2015   Dr. Britta Mccreedy: Mild diverticulosis, sessile polyp ranging 3 to 5 mm removed from the proximal transverse colon, semi-pedunculated polyp 5 to 9 mm in size removed from the sigmoid colon.  Sigmoid colon polyp was serrated adenoma, transverse colon polyp was adenomatous.  Patient was told to have another colonoscopy in 3 years.  . COLONOSCOPY WITH PROPOFOL N/A 10/11/2017   Procedure: COLONOSCOPY WITH PROPOFOL;  Surgeon: Daneil Dolin, MD;  Location: [facility] END[facility] SUITE;  Service: Endoscopy;   Laterality: N/A;  1:15pm  . POLYPECTOMY  10/11/2017   Procedure: POLYPECTOMY;  Surgeon: Daneil Dolin, MD;  Location: [facility] END[facility] SUITE;  Service: Endoscopy;;  descending colon polyps cs times 2    Allergies: Patient has no known allergies.  Medications: Prior to Admission medications   Medication Sig Start Date End Date Taking? Authorizing Provider  acetaminophen (TYLENOL) 500 MG tablet Take 1,000 mg by mouth daily as needed for headache.   Yes [provider]  aspirin EC 81 MG tablet Take 81 mg by mouth daily.   Yes [provider]  b complex vitamins capsule Take 1 capsule by mouth daily. 04/25/20  Yes Brunetta Genera, MD  B Complex-C (SUPER B COMPLEX PO) Take 1 capsule by mouth daily at 2 PM.   Yes [provider]  Carboxymethylcellul-Glycerin (LUBRICATING EYE DROPS OP) Place 1 drop into the right eye 4 (four) times daily. Use for 3 days prior to surgery   Yes [provider]  Cholecalciferol (VITAMIN D3 PO) Take by mouth. 02/20/17  Yes [provider]  Cholecalciferol (VITAMIN D3) 5000 units CAPS Take 5,000 Units by mouth daily.   Yes [provider]  Cyanocobalamin (VITAMIN B12) 3000 MCG/ML LIQD Place 3,000 mcg under the tongue daily. 04/25/20  Yes Brunetta Genera, MD  lisinopril-hydrochlorothiazide (PRINZIDE,ZESTORETIC) 10-12.5 MG tablet Take 1 tablet by mouth daily.   Yes [provider]  loratadine (CLARITIN) 10 MG tablet Take 10 mg by mouth daily as needed for allergies.   Yes [provider]  omeprazole (PRILOSEC) 40 MG capsule Take by mouth. 01/31/20  Yes [provider]  vitamin C (ASCORBIC ACID) 500 MG tablet Take 500 mg by mouth daily.   Yes [provider]  dexamethasone (DECADRON) 4 MG tablet Take 5 tablets (20 mg total) by mouth once a week. 04/25/20   Brunetta Genera, MD  Loratadine 10 MG CAPS  10 mg =, daily, 0 Refill(s) 06/23/17   [provider]  predniSONE  (DELTASONE) 20 MG tablet Take 20 mg by mouth 2 (two) times daily. 02/16/20   [provider]     Family History  Problem Relation Age of Onset  . Heart disease Mother   . Diabetes Mother   . Lung cancer Father   . COPD Father   . Colon cancer Neg Hx     Social History   Socioeconomic History  . Marital status: Married    Spouse name: Not on file  . Number of children: Not on file  . Years of education: Not on file  . Highest education level: Not on file  Occupational History  . Not on file  Tobacco Use  . Smoking status: Former Smoker    Packs/day: 1.00    Years: 28.00    Pack years: 28.00    Types: Cigarettes    Quit date: 10/08/2013    Years since quitting: 6.6  . Smokeless tobacco: Never Used  Vaping Use  . Vaping Use: Never used  Substance and Sexual Activity  . Alcohol use: Not Currently  . Drug use: Never  . Sexual activity: Yes    Birth control/protection: Post-menopausal  Other Topics Concern  . Not on file  Social History Narrative  . Not on file   Social Determinants of Health   Financial Resource Strain: Not on file  Food Insecurity: Not on file  Transportation Needs: Not on file  Physical Activity: Not on file  Stress: Not on file  Social Connections: Not on file     Review of Systems: A 12 point ROS discussed and pertinent positives are indicated in the HPI above.  All other systems are negative.   Vital Signs: BP 131/76   Pulse 61   Temp 98.1 F (36.7 C) (Oral)   Resp 16   SpO2 100%   Constitutional:      Appearance: Normal appearance.  Cardiovascular:     Rate and Rhythm: Normal rate and regular rhythm.     Pulses: Normal pulses.     Heart sounds: Normal heart sounds.  Pulmonary:     Effort: Pulmonary effort is normal.     Breath sounds: Normal breath sounds.  Abdominal:     General: Abdomen is flat. Bowel sounds are normal.     Palpations: Abdomen is soft.  Neurological:     Mental Status: Patient is alert and  oriented to person, place, and time.  Psychiatric:        Mood and Affect: states that she is little nervous.        Behavior: Behavior normal.        Thought Content: Thought content normal.        Judgment: Judgment normal.    MD Evaluation Airway: WNL Heart: WNL Abdomen: WNL Chest/ Lungs: WNL ASA  Classification: 2 Mallampati/Airway Score: One  Imaging: NM PET Image Initial (PI) Whole Body  Result Date: 05/17/2020 CLINICAL DATA:  Initial treatment strategy for multiple myeloma. EXAM: NUCLEAR MEDICINE PET WHOLE BODY TECHNIQUE: 7.0 mCi F-18 FDG was injected intravenously. Full-ring PET imaging was performed from the head to foot after the radiotracer. CT  data was obtained and used for attenuation correction and anatomic localization. Fasting blood glucose: 98 mg/dl COMPARISON:  Multiple exams, including CT chest report from 01/23/2019 FINDINGS: Mediastinal blood pool activity: SUV max 2.4 HEAD/NECK: No significant abnormal hypermetabolic activity in this region. Incidental CT findings: Chronic right maxillary sinusitis. Bilateral common carotid atherosclerotic calcification. CHEST: Sharply defined 3.1 by 1.2 cm ground-glass density lesion in the left upper lobe on image 93 of series 4 is hypermetabolic with maximum SUV 11.4. A smaller focus of subpleural ground-glass opacity in the left upper lobe measuring 1.3 by 0.8 cm on image 103 of series 4 has a maximum SUV of 3.1. 0.6 by 0.4 cm left lower lobe nodule on image 115 of series 4, without appreciable accentuated metabolic activity. Subpleural ground-glass nodularity in the left lower lobe measuring 0.9 by 0.6 cm without hypermetabolic activity on image 35 series 8. Mildly focally accentuated distal esophageal activity with maximum SUV 4.3. This is most commonly physiologic at this location. Incidental CT findings: Atherosclerotic calcification of the aortic arch and branch vessels. ABDOMEN/PELVIS: No significant abnormal hypermetabolic activity  in this region. Incidental CT findings: Aortoiliac atherosclerotic vascular disease. Cholecystectomy. Sigmoid colon diverticulosis. Pelvic floor laxity with cystocele. SKELETON: Diffusely accentuated marrow activity throughout the axial skeleton and in much of the proximal appendicular skeleton, without focal lesions identified and without typical punched-out cortical lesions often encountered in the setting of myeloma. The appearance on today's exam could be from granulocyte stimulation (correlate with any history of minute stray shin of granulocyte stimulating pharmaceuticals) or CT occult marrow infiltration. Incidental CT findings: Mild degenerative anterolisthesis of L5 on S1. EXTREMITIES: No significant abnormal hypermetabolic activity in this region. Incidental CT findings: Subcutaneous edema along the heels. IMPRESSION: 1. A 03.1 by 1.2 cm ground-glass density in the left upper lobe is notably hypermetabolic with maximum SUV 11.4, and a smaller focus of subpleural ground-glass opacity in the left upper lobe is mildly hypermetabolic. Possibilities may include lepidic growth lung cancer or atypical infectious process. Tissue sampling may be warranted. 2. Generalized accentuated marrow activity in the axial skeleton and proximal appendicular skeleton, symmetric and without focal lesions on the CT data. This could be from granulocyte stimulation (correlate with any medication aimed at granulocyte stimulation) or CT occult marrow infiltrative process. 3. Other imaging findings of potential clinical significance: Chronic right maxillary sinusitis. Aortic Atherosclerosis (ICD10-I70.0). Sigmoid colon diverticulosis. Pelvic floor laxity with cystocele. Electronically Signed   By: Van Clines M.D.   On: 05/17/2020 13:07    Labs:  CBC: Recent Labs    04/16/20 1228 05/20/20 0745  WBC 7.6 9.2  HGB 9.4* 9.9*  HCT 30.0* 31.3*  PLT 221 245    COAGS: Recent Labs    05/20/20 0745  INR 1.0     BMP: Recent Labs    04/16/20 1228  NA 141  K 4.6  CL 103  CO2 27  GLUCOSE 94  BUN 22*  CALCIUM 9.2  CREATININE 1.28*  GFRNONAA 48*    LIVER FUNCTION TESTS: Recent Labs    04/16/20 1228  BILITOT 0.3  AST 17  ALT 12  ALKPHOS 56  PROT 11.0*  ALBUMIN 3.0*    TUMOR MARKERS: No results for input(s): AFPTM, CEA, CA199, CHROMGRNA in the last 8760 hours.  Assessment and Plan: 60 y.o. female with newly diagnosed multiple myeloma managed by Dr. Irene Limbo.  Patient presents to IR for image guided bone marrow aspiration and biopsy for further evaluation of the newly diagnosed multiple myeloma.  N.p.o.  since 9 PM last night INR 1.0 Vitals reviewed, all within normal limit.  Risks and benefits of image guided bone marrow aspiration and biopsy was discussed with the patient and/or patient's family including, but not limited to bleeding, infection, damage to adjacent structures or low yield requiring additional tests.  All of the questions were answered and there is agreement to proceed.  Consent signed and in chart.  Thank you for this interesting consult.  I greatly enjoyed meeting Megan Velasquez and look forward to participating in their care.  A copy of this report was sent to the requesting provider on this date.  Electronically Signed: Tera Mater, PA-C 05/20/2020, 8:16 AM   I spent a total of  30 Minutes   in face to face in clinical consultation, greater than 50% of which was counseling/coordinating care for image guided bone marrow aspiration and biopsy

## 2020-05-20 NOTE — Discharge Instructions (Signed)
Urgent needs - Interventional Radiology on call MD [phone]  Wound - May remove dressing and shower tomorrow.  Keep site clean and dry.  Replace with bandaid as needed.  Do not submerge in tub or water until site healing well. If closed with glue, glue will flake off on its own.   Bone Marrow Aspiration and Bone Marrow Biopsy, Adult, Care After This sheet gives you information about how to care for yourself after your procedure. Your health care provider may also give you more specific instructions. If you have problems or questions, contact your health care provider. What can I expect after the procedure? After the procedure, it is common to have:  Mild pain and tenderness.  Swelling.  Bruising. Follow these instructions at home: Puncture site care  Follow instructions from your health care provider about how to take care of the puncture site. Make sure you: ? Wash your hands with soap and water before and after you change your bandage (dressing). If soap and water are not available, use hand sanitizer. ? Change your dressing as told by your health care provider.  Check your puncture site every day for signs of infection. Check for: ? More redness, swelling, or pain. ? Fluid or blood. ? Warmth. ? Pus or a bad smell.   Activity  Return to your normal activities as told by your health care provider. Ask your health care provider what activities are safe for you.  Do not lift anything that is heavier than 10 lb (4.5 kg), or the limit that you are told, until your health care provider says that it is safe.  Do not drive for 24 hours if you were given a sedative during your procedure. General instructions  Take over-the-counter and prescription medicines only as told by your health care provider.  Do not take baths, swim, or use a hot tub until your health care provider approves. Ask your health care provider if you may take showers. You may only be allowed to take sponge  baths.  If directed, put ice on the affected area. To do this: ? Put ice in a plastic bag. ? Place a towel between your skin and the bag. ? Leave the ice on for 20 minutes, 2-3 times a day.  Keep all follow-up visits as told by your health care provider. This is important.   Contact a health care provider if:  Your pain is not controlled with medicine.  You have a fever.  You have more redness, swelling, or pain around the puncture site.  You have fluid or blood coming from the puncture site.  Your puncture site feels warm to the touch.  You have pus or a bad smell coming from the puncture site. Summary  After the procedure, it is common to have mild pain, tenderness, swelling, and bruising.  Follow instructions from your health care provider about how to take care of the puncture site and what activities are safe for you.  Take over-the-counter and prescription medicines only as told by your health care provider.  Contact a health care provider if you have any signs of infection, such as fluid or blood coming from the puncture site. This information is not intended to replace advice given to you by your health care provider. Make sure you discuss any questions you have with your health care provider. Document Revised: 08/09/2018 Document Reviewed: 08/09/2018 Elsevier Patient Education  2021 [employer]   Moderate Conscious Sedation, Adult, Care After This sheet gives you   information about how to care for yourself after your procedure. Your health care provider may also give you more specific instructions. If you have problems or questions, contact your health care provider. What can I expect after the procedure? After the procedure, it is common to have:  Sleepiness for several hours.  Impaired judgment for several hours.  Difficulty with balance.  Vomiting if you eat too soon. Follow these instructions at home: For the time period you were told by your health care  provider:  Rest.  Do not participate in activities where you could fall or become injured.  Do not drive or use machinery.  Do not drink alcohol.  Do not take sleeping pills or medicines that cause drowsiness.  Do not make important decisions or sign legal documents.  Do not take care of children on your own.      Eating and drinking  Follow the diet recommended by your health care provider.  Drink enough fluid to keep your urine pale yellow.  If you vomit: ? Drink water, juice, or soup when you can drink without vomiting. ? Make sure you have little or no nausea before eating solid foods.   General instructions  Take over-the-counter and prescription medicines only as told by your health care provider.  Have a responsible adult stay with you for the time you are told. It is important to have someone help care for you until you are awake and alert.  Do not smoke.  Keep all follow-up visits as told by your health care provider. This is important. Contact a health care provider if:  You are still sleepy or having trouble with balance after 24 hours.  You feel light-headed.  You keep feeling nauseous or you keep vomiting.  You develop a rash.  You have a fever.  You have redness or swelling around the IV site. Get help right away if:  You have trouble breathing.  You have new-onset confusion at home. Summary  After the procedure, it is common to feel sleepy, have impaired judgment, or feel nauseous if you eat too soon.  Rest after you get home. Know the things you should not do after the procedure.  Follow the diet recommended by your health care provider and drink enough fluid to keep your urine pale yellow.  Get help right away if you have trouble breathing or new-onset confusion at home. This information is not intended to replace advice given to you by your health care provider. Make sure you discuss any questions you have with your health care  provider. Document Revised: 07/21/2019 Document Reviewed: 02/16/2019 Elsevier Patient Education  2021 [employer].     

## 2020-05-23 LAB — SURGICAL PATHOLOGY

## 2020-05-24 ENCOUNTER — Other Ambulatory Visit: Payer: Self-pay | Admitting: Hematology

## 2020-05-26 NOTE — Progress Notes (Signed)
HEMATOLOGY/ONCOLOGY CONSULTATION NOTE  Date of Service: 05/26/2020  Patient Care Team: [name], MD as PCP - General (Family Medicine) [name], [name], MD as PCP - Cardiology (Cardiology) [name] [name], MD as Consulting Physician (Gastroenterology)  CHIEF COMPLAINTS/PURPOSE OF CONSULTATION:  Multiple Myeloma  HISTORY OF PRESENTING ILLNESS:   Megan Velasquez is a wonderful 60 y.o. female who has been referred to [place] for evaluation and management of multiple myeloma. Pt is accompanied today by her husband for this visit.   Patient was referred to [place] for second opinion regarding management of newly diagnosed multiple myeloma by Dr. Laurena Slimmer MD.  Patient had been referred to to [facility] center by her primary care physician for anemia and elevated serum protein levels.  She also had some increasing fatigue and tingling in her hands and feet.  Noted primarily in the right wrist and right ankle.  B12 levels were noted to be borderline low.  She had a work-up including labs which showed hemoglobin of 9.7 with an MCV of 86.9 WBC count of 6.95k and platelets of 165k. CMP showed a creatinine of 1.06 calcium level of 8.9 total protein of 10.2 with an albumin of 2.7 normal liver function tests and other electrolytes. Serum protein electrophoresis with an M spike of 4.3 with an additional M spike of 0.2 g/dL.  IgA quantitative more than 6400. B12 levels 276, homocystine 15.9 Serum folate 26.5 LDH 96 Beta-2 microglobulin of 2.5  Serum kappa lambda free light chains showed free kappa light chains of 181, free lambda light chains of 3.6 and a free kappa lambda ratio of 50.3.  24-hour UPEP showed no M spike and a total protein of 5.7 mg/dL.  Bone Survey completed on 03/08/2020 with results revealing "No evidence of lytic nor blastic lesions within the appendicular or axial skeleton. Degenerative changes as described above."   On review of systems, pt reports some mild fatigue.  No  focal bone pains.  No chest pain.  No shortness of breath. No evidence of GI bleeding no hematuria no nosebleeds or gum bleeds. No other acute new focal symptoms. Denies any history of known cardiopulmonary disease strokes or heart attacks.  INTERVAL HISTORY  Megan Velasquez is a wonderful 60 y.o. female who is here today for evaluation and management of multiple myeloma. The patient's last visit with [place] was on 05/17/2020. The pt reports that she is doing well overall.  The pt reports understandably feeling a little anxious and also some fatigue.  Of note since the patient's last visit, pt has had NM PET Whole Body ([id number]) on 05/17/2020, which revealed "1. A 03.1 by 1.2 cm ground-glass density in the left upper lobe is notably hypermetabolic with maximum SUV 11.4, and a smaller focus of subpleural ground-glass opacity in the left upper lobe is mildly hypermetabolic. Possibilities may include lepidic growth lung cancer or atypical infectious process. Tissue sampling may be warranted. 2. Generalized accentuated marrow activity in the axial skeleton and proximal appendicular skeleton, symmetric and without focal lesions  on the CT data. This could be from granulocyte stimulation (correlate with any medication aimed at granulocyte stimulation) or CT occult marrow infiltrative process. 3. Other imaging findings of potential clinical significance: Chronic right maxillary sinusitis. Aortic Atherosclerosis (ICD10-I70.0). Sigmoid colon diverticulosis. Pelvic floor laxity with cystocele."  The pt has had CT Bone Marrow Biopsy & Aspiration ([id number]) on 05/20/2020, which revealed "- Kappa-restricted plasma cell myeloma involving a mildly hypercellular  bone marrow with decreased trilineage hematopoiesis.  Plasma cells are increased on aspirate smears (19% by manual differential count) and show atypical morphology."  Discussed all lab results from her previous clinic visit which showed anemia with  hemoglobin of 9.4, myeloma panel that showed an M spike of 4.7 g/dL plus an additional M spike of 0.3 g/dL of IgA kappa biclonal protein.  LDH was within normal limits.  Beta-2 microglobulin was 2.  25-hydroxy vitamin D level within normal limits at 71.5.  Sedimentation elevated at 137  MEDICAL HISTORY:  Past Medical History:  Diagnosis Date  . GERD (gastroesophageal reflux disease)   . History of kidney stones   . HTN (hypertension)     SURGICAL HISTORY: Past Surgical History:  Procedure Laterality Date  . CHOLECYSTECTOMY    . COLONOSCOPY  01/2015   Dr. Britta Mccreedy: Mild diverticulosis, sessile polyp ranging 3 to 5 mm removed from the proximal transverse colon, semi-pedunculated polyp 5 to 9 mm in size removed from the sigmoid colon.  Sigmoid colon polyp was serrated adenoma, transverse colon polyp was adenomatous.  Patient was told to have another colonoscopy in 3 years.  . COLONOSCOPY WITH PROPOFOL N/A 10/11/2017   Procedure: COLONOSCOPY WITH PROPOFOL;  Surgeon: Daneil Dolin, MD;  [facility]: [facility];  Service: Endoscopy;  Laterality: N/A;  1:15pm  . POLYPECTOMY  10/11/2017   Procedure: POLYPECTOMY;  Surgeon: Daneil Dolin, MD;  Location: [facility];  Service: Endoscopy;;  descending colon polyps cs times 2    SOCIAL HISTORY: Social History   Socioeconomic History  . Marital status: Married    Spouse name: Not on file  . Number of children: Not on file  . Years of education: Not on file  . Highest education level: Not on file  Occupational History  . Not on file  Tobacco Use  . Smoking status: Former Smoker    Packs/day: 1.00    Years: 28.00    Pack years: 28.00    Types: Cigarettes    Quit date: 10/08/2013    Years since quitting: 6.6  . Smokeless tobacco: Never Used  Vaping Use  . Vaping Use: Never used  Substance and Sexual Activity  . Alcohol use: Not Currently  . Drug use: Never  . Sexual activity: Yes    Birth control/protection: Post-menopausal  Other  Topics Concern  . Not on file  Social History Narrative  . Not on file   Social Determinants of Health   Financial Resource Strain: Not on file  Food Insecurity: Not on file  Transportation Needs: Not on file  Physical Activity: Not on file  Stress: Not on file  Social Connections: Not on file  Intimate Partner Violence: Not on file    FAMILY HISTORY: Family History  Problem Relation Age of Onset  . Heart disease Mother   . Diabetes Mother   . Lung cancer Father   . COPD Father   . Colon cancer Neg Hx     ALLERGIES:  has No Known Allergies.  MEDICATIONS:  Current Outpatient Medications  Medication Sig Dispense Refill  . acetaminophen (TYLENOL) 500 MG tablet Take 1,000 mg by mouth daily as needed for headache.    Marland Kitchen aspirin EC 81 MG tablet Take 81 mg by mouth daily.    Marland Kitchen b complex vitamins capsule Take 1 capsule by mouth daily. 30 capsule 3  . B Complex-C (SUPER B COMPLEX PO) Take 1 capsule by mouth daily at 2 PM.    . Carboxymethylcellul-Glycerin (LUBRICATING EYE DROPS OP) Place 1  drop into the right eye 4 (four) times daily. Use for 3 days prior to surgery    . Cholecalciferol (VITAMIN D3 PO) Take by mouth.    . Cholecalciferol (VITAMIN D3) 5000 units CAPS Take 5,000 Units by mouth daily.    . Cyanocobalamin (VITAMIN B12) 3000 MCG/ML LIQD Place 3,000 mcg under the tongue daily. 30 mL 1  . dexamethasone (DECADRON) 4 MG tablet Take 5 tablets (20 mg total) by mouth once a week. 40 tablet 2  . lisinopril-hydrochlorothiazide (PRINZIDE,ZESTORETIC) 10-12.5 MG tablet Take 1 tablet by mouth daily.    Marland Kitchen loratadine (CLARITIN) 10 MG tablet Take 10 mg by mouth daily as needed for allergies.    . Loratadine 10 MG CAPS  10 mg =, daily, 0 Refill(s)    . omeprazole (PRILOSEC) 40 MG capsule Take by mouth.    . predniSONE (DELTASONE) 20 MG tablet Take 20 mg by mouth 2 (two) times daily.    . vitamin C (ASCORBIC ACID) 500 MG tablet Take 500 mg by mouth daily.     No current  facility-administered medications for this visit.    REVIEW OF SYSTEMS:   10 Point review of Systems was done is negative except as noted above.  PHYSICAL EXAMINATION: ECOG PERFORMANCE STATUS: 1 - Symptomatic but completely ambulatory  . Vitals:   05/28/20 0923  BP: 135/67  Pulse: 92  Resp: 16  Temp: (!) 97.5 F (36.4 C)  SpO2: 99%   Filed Weights   05/28/20 0923  Weight: 137 lb 7 oz (62.3 kg)   .Body mass index is 22.87 kg/m.   NAD GENERAL:alert, in no acute distress and comfortable SKIN: no acute rashes, no significant lesions EYES: conjunctiva are pink and non-injected, sclera anicteric OROPHARYNX: MMM, no exudates, no oropharyngeal erythema or ulceration NECK: supple, no JVD LYMPH:  no palpable lymphadenopathy in the cervical, axillary or inguinal regions LUNGS: clear to auscultation b/l with normal respiratory effort HEART: regular rate & rhythm ABDOMEN:  normoactive bowel sounds , non tender, not distended. Extremity: no pedal edema PSYCH: alert & oriented x 3 with fluent speech NEURO: no focal motor/sensory deficits  LABORATORY DATA:  I have reviewed the data as listed  . CBC Latest Ref Rng & Units 05/20/2020 04/16/2020 10/08/2017  WBC 4.0 - 10.5 K/uL 9.2 7.6 7.2  Hemoglobin 12.0 - 15.0 g/dL 9.9(L) 9.4(L) 12.4  Hematocrit 36.0 - 46.0 % 31.3(L) 30.0(L) 37.5  Platelets 150 - 400 K/uL 245 221 216    . CMP Latest Ref Rng & Units 04/16/2020 10/08/2017  Glucose 70 - 99 mg/dL 94 86  BUN 6 - 20 mg/dL 22(H) 18  Creatinine 0.44 - 1.00 mg/dL 1.28(H) 0.96  Sodium 135 - 145 mmol/L 141 140  Potassium 3.5 - 5.1 mmol/L 4.6 3.6  Chloride 98 - 111 mmol/L 103 101  CO2 22 - 32 mmol/L 27 28  Calcium 8.9 - 10.3 mg/dL 9.2 9.1  Total Protein 6.5 - 8.1 g/dL 11.0(H) -  Total Bilirubin 0.3 - 1.2 mg/dL 0.3 -  Alkaline Phos 38 - 126 U/L 56 -  AST 15 - 41 U/L 17 -  ALT 0 - 44 U/L 12 -     Most recent lab results (03/15/2020) of CBC is as follows: all values are WNL except for  RBC at 3.66, Hgb at 9.7, HCT at 31.8, MCHC at 30.5, RDW Standard Deviation 58.7, Red Cell Distribution Width At 18.6,  BUN at 19, Creatinine at 1.06, Total Protein at 10.2, Albumin at 2.7, AST  at 14,. 03/13/2020 UPEP shows no M-Spike, all values are WNL 03/06/2020 SPEP shows Total Protein at 10.1, Beta Globulin at 5.2, M-Spike at 4.3, Total Globulin at 6.4, A/G Ratio at 0.6 03/06/2020 Free Kappa Light Chains at 181.1, Free Lambda Light Chains Serum at 3.6, K/L ratio at 50.31 03/06/2020 Beta-2 Microglobulin at 2.5  Surgical Pathology   THIS IS AN ADDENDUM REPORT   CASE: [id number]  PATIENT: Megan Velasquez  Bone Marrow Report  Addendum    Reason for Addendum #1: Molecular Genetic Test Results, [place]  Reason for Addendum #2: Cytogenetics results   Clinical History: Multiple myeloma, assess for marrow involvement      DIAGNOSIS:   BONE MARROW, ASPIRATE, CLOT, CORE:  - Kappa-restricted plasma cell myeloma involving a mildly hypercellular  bone marrow with decreased trilineage hematopoiesis  - See comment   PERIPHERAL BLOOD:  - Normocytic anemia with rouleaux formation    COMMENT:   Plasma cells are increased on aspirate smears (19% by manual  differential count) and show atypical morphology. CD138  immunohistochemistry on the clot and core biopsy highlights a diffuse  increase in plasma cells comprising 60-70% of total marrow cellularity.  The majority of plasma cells show aberrant CD56 expression by  immunohistochemistry; a small subset of plasma cells aberrantly  expresses CD20. Plasma cells are kappa-restricted by light chain in situ  hybridization. Together, the findings support the diagnosis of marrow  involvement by plasma cell myeloma. Correlation with clinical findings,  radiographic studies, other laboratory data, and cytogenetics/FISH  results is recommended.   RADIOGRAPHIC STUDIES: I have personally reviewed the radiological images as listed and agreed with  the findings in the report. NM PET Image Initial (PI) Whole Body  Result Date: 05/17/2020 CLINICAL DATA:  Initial treatment strategy for multiple myeloma. EXAM: NUCLEAR MEDICINE PET WHOLE BODY TECHNIQUE: 7.0 mCi F-18 FDG was injected intravenously. Full-ring PET imaging was performed from the head to foot after the radiotracer. CT data was obtained and used for attenuation correction and anatomic localization. Fasting blood glucose: 98 mg/dl COMPARISON:  Multiple exams, including CT chest report from 01/23/2019 FINDINGS: Mediastinal blood pool activity: SUV max 2.4 HEAD/NECK: No significant abnormal hypermetabolic activity in this region. Incidental CT findings: Chronic right maxillary sinusitis. Bilateral common carotid atherosclerotic calcification. CHEST: Sharply defined 3.1 by 1.2 cm ground-glass density lesion in the left upper lobe on image 93 of series 4 is hypermetabolic with maximum SUV 11.4. A smaller focus of subpleural ground-glass opacity in the left upper lobe measuring 1.3 by 0.8 cm on image 103 of series 4 has a maximum SUV of 3.1. 0.6 by 0.4 cm left lower lobe nodule on image 115 of series 4, without appreciable accentuated metabolic activity. Subpleural ground-glass nodularity in the left lower lobe measuring 0.9 by 0.6 cm without hypermetabolic activity on image 35 series 8. Mildly focally accentuated distal esophageal activity with maximum SUV 4.3. This is most commonly physiologic at this location. Incidental CT findings: Atherosclerotic calcification of the aortic arch and branch vessels. ABDOMEN/PELVIS: No significant abnormal hypermetabolic activity in this region. Incidental CT findings: Aortoiliac atherosclerotic vascular disease. Cholecystectomy. Sigmoid colon diverticulosis. Pelvic floor laxity with cystocele. SKELETON: Diffusely accentuated marrow activity throughout the axial skeleton and in much of the proximal appendicular skeleton, without focal lesions identified and without  typical punched-out cortical lesions often encountered in the setting of myeloma. The appearance on today's exam could be from granulocyte stimulation (correlate with any history of minute stray shin of granulocyte stimulating pharmaceuticals) or CT occult  marrow infiltration. Incidental CT findings: Mild degenerative anterolisthesis of L5 on S1. EXTREMITIES: No significant abnormal hypermetabolic activity in this region. Incidental CT findings: Subcutaneous edema along the heels. IMPRESSION: 1. A 03.1 by 1.2 cm ground-glass density in the left upper lobe is notably hypermetabolic with maximum SUV 11.4, and a smaller focus of subpleural ground-glass opacity in the left upper lobe is mildly hypermetabolic. Possibilities may include lepidic growth lung cancer or atypical infectious process. Tissue sampling may be warranted. 2. Generalized accentuated marrow activity in the axial skeleton and proximal appendicular skeleton, symmetric and without focal lesions on the CT data. This could be from granulocyte stimulation (correlate with any medication aimed at granulocyte stimulation) or CT occult marrow infiltrative process. 3. Other imaging findings of potential clinical significance: Chronic right maxillary sinusitis. Aortic Atherosclerosis (ICD10-I70.0). Sigmoid colon diverticulosis. Pelvic floor laxity with cystocele. Electronically Signed   By: Van Clines M.D.   On: 05/17/2020 13:07   CT Biopsy  Result Date: 05/20/2020 INDICATION: 60 year old female with new diagnosis of multiple myeloma. Evaluate marrow involvement for staging. EXAM: CT GUIDED BONE MARROW ASPIRATION AND CORE BIOPSY Interventional Radiologist:  Criselda Peaches, MD MEDICATIONS: None. ANESTHESIA/SEDATION: Moderate (conscious) sedation was employed during this procedure. A total of 2 milligrams versed and 100 micrograms fentanyl were administered intravenously. The patient's level of consciousness and vital signs were monitored  continuously by radiology nursing throughout the procedure under my direct supervision. Total monitored sedation time: 10 minutes FLUOROSCOPY TIME:  None. COMPLICATIONS: None immediate. Estimated blood loss: <25 mL PROCEDURE: Informed written consent was obtained from the patient after a thorough discussion of the procedural risks, benefits and alternatives. All questions were addressed. Maximal Sterile Barrier Technique was utilized including caps, mask, sterile gowns, sterile gloves, sterile drape, hand hygiene and skin antiseptic. A timeout was performed prior to the initiation of the procedure. The patient was positioned prone and non-contrast localization CT was performed of the pelvis to demonstrate the iliac marrow spaces. Maximal barrier sterile technique utilized including caps, mask, sterile gowns, sterile gloves, large sterile drape, hand hygiene, and betadine prep. Under sterile conditions and local anesthesia, an 11 gauge coaxial bone biopsy needle was advanced into the right iliac marrow space. Needle position was confirmed with CT imaging. Initially, bone marrow aspiration was performed. Next, the 11 gauge outer cannula was utilized to obtain a right iliac bone marrow core biopsy. Needle was removed. Hemostasis was obtained with compression. The patient tolerated the procedure well. Samples were prepared with the cytotechnologist. IMPRESSION: Technically successful CT-guided bone marrow aspiration and core biopsy of the right iliac bone. Electronically Signed   By: Jacqulynn Cadet M.D.   On: 05/20/2020 12:35   CT BONE MARROW BIOPSY & ASPIRATION  Result Date: 05/20/2020 INDICATION: 60 year old female with new diagnosis of multiple myeloma. Evaluate marrow involvement for staging. EXAM: CT GUIDED BONE MARROW ASPIRATION AND CORE BIOPSY Interventional Radiologist:  Criselda Peaches, MD MEDICATIONS: None. ANESTHESIA/SEDATION: Moderate (conscious) sedation was employed during this procedure. A  total of 2 milligrams versed and 100 micrograms fentanyl were administered intravenously. The patient's level of consciousness and vital signs were monitored continuously by radiology nursing throughout the procedure under my direct supervision. Total monitored sedation time: 10 minutes FLUOROSCOPY TIME:  None. COMPLICATIONS: None immediate. Estimated blood loss: <25 mL PROCEDURE: Informed written consent was obtained from the patient after a thorough discussion of the procedural risks, benefits and alternatives. All questions were addressed. Maximal Sterile Barrier Technique was utilized including caps, mask, sterile gowns, sterile  gloves, sterile drape, hand hygiene and skin antiseptic. A timeout was performed prior to the initiation of the procedure. The patient was positioned prone and non-contrast localization CT was performed of the pelvis to demonstrate the iliac marrow spaces. Maximal barrier sterile technique utilized including caps, mask, sterile gowns, sterile gloves, large sterile drape, hand hygiene, and betadine prep. Under sterile conditions and local anesthesia, an 11 gauge coaxial bone biopsy needle was advanced into the right iliac marrow space. Needle position was confirmed with CT imaging. Initially, bone marrow aspiration was performed. Next, the 11 gauge outer cannula was utilized to obtain a right iliac bone marrow core biopsy. Needle was removed. Hemostasis was obtained with compression. The patient tolerated the procedure well. Samples were prepared with the cytotechnologist. IMPRESSION: Technically successful CT-guided bone marrow aspiration and core biopsy of the right iliac bone. Electronically Signed   By: Jacqulynn Cadet M.D.   On: 05/20/2020 12:35    ASSESSMENT & PLAN:   60 year old female with  #1 Newly diagnosed multiple myeloma  RISS Stage II, IgA kappa Presenting with anemia hemoglobin today 9.4. Creatinine 1.28 No hypercalcemia Bone survey with no obvious skeletal  lesions.  Baseline M spike of 4.7 g/dL.+ additional spike of 0.3g/dl Beta-2 Microglobulin at 2.5 Albumin 2 Bone marrow biopsy-showing 60 to 70% abnormal plasma cells which are kappa restricted consistent with multiple myeloma. Molecular cytogenetics-complex karyotype would duplication 1 q., monosomy 13, deletions of 14 q. and 16 q.  #2 anemia likely related to multiple myeloma.  Ferritin adequate B12 levels low normal.  #3 renal insufficiency creatinine 1.28 likely some element of renal insufficiency for myeloma.  PLAN: -Discussed patient's labs showing anemia and early renal insufficiency. -Labs clearly show significant biclonal spike of IgA kappa 4.7+0.3 g/dL -We discussed her diagnosis of active multiple myeloma given more than 60% clonal plasma cells in the bone marrow and associated anemia and early renal insufficiency. -We recommended initiating treatment. -We discussed that treatment is to control the disease and would not be considered curative. -We discussed the natural history of the disease, diagnostic elements, treatment options and prognosis. -Given her adverse molecular genetics we discussed induction treatment with carfilzomib/Revlimid/dexamethasone. -We will start Revlimid at 15 mg 3 weeks on 1 week off and advance dose if tolerated. -We will need to be in supportive acyclovir 400 mg p.o. twice daily for shingles prophylaxis -Need to be on aspirin 81 mg p.o. daily for VTE prophylaxis with Revlimid. -Treatment orders placed -Discussed Port-A-Cath placement and patient later called back and agreed to pursue this and this was ordered. -He has been scheduled for patient education 06/06/2020 -She will be starting her carfilzomib and seeing [place] back with labs on 06/06/2020.  All of the patients questions were answered with apparent satisfaction. The patient knows to call the clinic with any problems, questions or concerns.   The total time spent in the appointment was 60 minutes  and more than 50% was on counseling and direct patient cares.   Sullivan Lone MD [place] AAHIVMS [facility] Hematology/Oncology Physician [facility]  (Office):       [phone] (Work cell):  [phone] (Fax):           [phone]  05/26/2020 9:54 AM  I, Reinaldo Raddle, am acting as scribe for Dr. Sullivan Lone, MD. .I have reviewed the above documentation for accuracy and completeness, and I agree with the above. Brunetta Genera MD

## 2020-05-27 ENCOUNTER — Encounter (HOSPITAL_COMMUNITY): Payer: Self-pay | Admitting: Hematology

## 2020-05-27 ENCOUNTER — Telehealth: Payer: Self-pay | Admitting: *Deleted

## 2020-05-27 NOTE — Telephone Encounter (Signed)
Voicemail message for Megan Velasquez [phone]) regarding FMLA forms.  No return fax or e-mail for patient's employer ([employer] benefits department.  Form at entry registration for pick up tomorrow 05/28/2020 or may call this nurse with information to return.   FMLA for spouse [name] returned 05/24/2020 also in envelope ready for pick-up.Marland Kitchen

## 2020-05-28 ENCOUNTER — Inpatient Hospital Stay: Payer: BC Managed Care – PPO | Attending: Hematology | Admitting: Hematology

## 2020-05-28 ENCOUNTER — Other Ambulatory Visit: Payer: Self-pay

## 2020-05-28 VITALS — BP 135/67 | HR 92 | Temp 97.5°F | Resp 16 | Ht 65.0 in | Wt 137.4 lb

## 2020-05-28 DIAGNOSIS — N289 Disorder of kidney and ureter, unspecified: Secondary | ICD-10-CM | POA: Diagnosis not present

## 2020-05-28 DIAGNOSIS — Z87891 Personal history of nicotine dependence: Secondary | ICD-10-CM | POA: Diagnosis not present

## 2020-05-28 DIAGNOSIS — D649 Anemia, unspecified: Secondary | ICD-10-CM | POA: Diagnosis not present

## 2020-05-28 DIAGNOSIS — C9000 Multiple myeloma not having achieved remission: Secondary | ICD-10-CM | POA: Diagnosis present

## 2020-05-28 NOTE — Progress Notes (Signed)
Per Dr. Grier Mitts orders - enroll patient in Lenalidomide REMS program (will be starting Revlimid), providing patient with Medication Guide ; provide patient with number for [facility] SW; provide patient with print outs on Myeloma-Basics/Beyond Basics from [employer]To[employer]. Patient enrolled in Lenalidomide REMS, faxed to [phone].

## 2020-05-28 NOTE — Telephone Encounter (Signed)
Received call from employer, [employer]   Craige Cotta provided fax number to return FMLA form for this patient.

## 2020-05-29 ENCOUNTER — Encounter (HOSPITAL_COMMUNITY): Payer: Self-pay | Admitting: Hematology

## 2020-05-29 ENCOUNTER — Encounter: Payer: Self-pay | Admitting: General Practice

## 2020-05-29 NOTE — Progress Notes (Signed)
[facility] Psychosocial Distress Screening Clinical Social Work  Clinical Social Work was referred by distress screening protocol.  The patient scored a 5 on the Psychosocial Distress Thermometer which indicates moderate distress. Clinical Social Worker contacted patient by phone to assess for distress and other psychosocial needs. Unable to reach, left generic voice mail w my contact information and encouragement to call back.    ONCBCN DISTRESS SCREENING 05/28/2020  Screening Type Change in Status  Distress experienced in past week (1-10) 5  Emotional problem type Adjusting to illness  Information Concerns Type Lack of info about treatment;Lack of info about diagnosis  Referral to clinical social work Yes    Clinical Social Worker follow up needed: Yes.    If yes, follow up plan:  Call again.  Beverely Pace, [place], LCSW Clinical Social Worker Phone:  [phone]

## 2020-05-30 ENCOUNTER — Telehealth: Payer: Self-pay | Admitting: General Practice

## 2020-05-30 ENCOUNTER — Telehealth: Payer: Self-pay

## 2020-05-30 ENCOUNTER — Telehealth: Payer: Self-pay | Admitting: Pharmacist

## 2020-05-30 ENCOUNTER — Other Ambulatory Visit: Payer: Self-pay | Admitting: Hematology

## 2020-05-30 DIAGNOSIS — Z7189 Other specified counseling: Secondary | ICD-10-CM

## 2020-05-30 DIAGNOSIS — C9000 Multiple myeloma not having achieved remission: Secondary | ICD-10-CM

## 2020-05-30 MED ORDER — ONDANSETRON HCL 8 MG PO TABS: 8.0000 mg | ORAL_TABLET | Freq: Two times a day (BID) | ORAL | 1 refills | Status: DC | PRN

## 2020-05-30 MED ORDER — LORAZEPAM 0.5 MG PO TABS: 0.5000 mg | ORAL_TABLET | Freq: Four times a day (QID) | ORAL | 0 refills | Status: DC | PRN

## 2020-05-30 MED ORDER — ACYCLOVIR 400 MG PO TABS: 400.0000 mg | ORAL_TABLET | Freq: Two times a day (BID) | ORAL | 3 refills | Status: DC

## 2020-05-30 MED ORDER — DEXAMETHASONE 4 MG PO TABS: ORAL_TABLET | ORAL | 4 refills | Status: DC

## 2020-05-30 MED ORDER — PROCHLORPERAZINE MALEATE 10 MG PO TABS: 10.0000 mg | ORAL_TABLET | Freq: Four times a day (QID) | ORAL | 1 refills | Status: DC | PRN

## 2020-05-30 MED ORDER — LENALIDOMIDE 15 MG PO CAPS: 15.0000 mg | ORAL_CAPSULE | Freq: Every day | ORAL | 0 refills | Status: DC

## 2020-05-30 MED ORDER — LIDOCAINE-PRILOCAINE 2.5-2.5 % EX CREA: TOPICAL_CREAM | CUTANEOUS | 3 refills | Status: DC

## 2020-05-30 NOTE — Progress Notes (Signed)
Port and CRD treatment plan orders placed

## 2020-05-30 NOTE — Telephone Encounter (Signed)
Oral Oncology Patient Advocate Encounter  Prior Authorization for Revlimid has been approved.    PA# [id number] Effective dates: 05/30/20 through 05/30/21  Patient must fill at [facility] will continue to follow.   [name] Megan Velasquez Phone [phone] Fax [phone] 05/30/2020 1:09 PM

## 2020-05-30 NOTE — Progress Notes (Signed)
START ON PATHWAY REGIMEN - Multiple Myeloma and Other Plasma Cell Dyscrasias     A cycle is every 28 days:     Carfilzomib      Carfilzomib      Carfilzomib      Dexamethasone      Dexamethasone      Lenalidomide   **Always confirm dose/schedule in your pharmacy ordering system**  Patient Characteristics: Multiple Myeloma, Newly Diagnosed, Transplant Eligible, High Risk Disease Classification: Multiple Myeloma R-ISS Staging: II Therapeutic Status: Newly Diagnosed Is Patient Eligible for Transplant<= Transplant Eligible Risk Status: High Risk Intent of Therapy: Non-Curative / Palliative Intent, Discussed with Patient

## 2020-05-30 NOTE — Telephone Encounter (Signed)
Oral Oncology Patient Advocate Encounter  Received notification from [place] [facility] that prior authorization for Revlimid is required.  PA submitted on CoverMyMeds Key [id number] Status is pending  Oral Oncology Clinic will continue to follow.  [place] Patient [place] Phone [phone] Fax [phone] 05/30/2020 1:08 PM

## 2020-05-30 NOTE — Telephone Encounter (Signed)
[facility] CSW Progress Notes  Second call to patient to address referral for Distress Screen, no answer, left VM w my contact information and request for call back.   Edwyna Shell, LCSW Clinical Social Worker Phone:  [phone]

## 2020-05-31 LAB — SURGICAL PATHOLOGY

## 2020-05-31 MED ORDER — LENALIDOMIDE 15 MG PO CAPS: 15.0000 mg | ORAL_CAPSULE | Freq: Every day | ORAL | 0 refills | Status: DC

## 2020-05-31 NOTE — Telephone Encounter (Signed)
Oral Oncology Pharmacist Encounter  Prescription for Revlimid (lenalidomide) is required to be filled through [facility]. Prescription redirected to [facility] for dispensing.  Oral Oncology Clinic will continue to follow.  Leron Croak, PharmD, BCPS Hematology/Oncology Clinical Pharmacist [facility] [phone] 05/31/2020 10:02 AM

## 2020-06-04 ENCOUNTER — Other Ambulatory Visit: Payer: Self-pay

## 2020-06-04 ENCOUNTER — Other Ambulatory Visit: Payer: Self-pay | Admitting: Radiology

## 2020-06-04 ENCOUNTER — Telehealth: Payer: Self-pay | Admitting: General Practice

## 2020-06-04 ENCOUNTER — Other Ambulatory Visit: Payer: Self-pay | Admitting: Student

## 2020-06-04 DIAGNOSIS — C9000 Multiple myeloma not having achieved remission: Secondary | ICD-10-CM

## 2020-06-04 NOTE — Telephone Encounter (Signed)
Oral Chemotherapy Pharmacist Encounter   Followed up with [facility] regarding status of patient's Revlimid (lenalidomide) prescription. Representative stated patient was informed yesterday of $300 copay for 1st fill of Revlimid and they are working with patient to obtain copay assistance at this time.   Oral Oncology Clinic will continue to follow.  Leron Croak, PharmD, BCPS Hematology/Oncology Clinical Pharmacist [facility] [phone] 06/04/2020 8:46 AM

## 2020-06-04 NOTE — Telephone Encounter (Signed)
Oral Oncology Pharmacist Encounter  Received new prescription for Revlimid (lenalidomide) for the treatment of multiple myeloma in conjunction with carfilzomib and dexamethasone, planned duration ~8 cycles pending response to therapy.  Prescription dose and frequency assessed for appropriateness. Appropriate for therapy initiation.   CBC from 05/20/20 and CBC w/ Diff from 04/16/20 assessed, noted Hgb 9.9 mg/dL and Scr of 1.28 (CrCl ~46 mL/min) - will be continually monitored while on therapy, no further dose adjustments required at this time.   Current medication list in Epic reviewed, no relevant/significant DDIs with Revlimid identified.  Evaluated chart and no patient barriers to medication adherence noted.   Oncology treatment consent attestation noted on 05/30/20, which includes attestation for Revlimid.  Prescription is required to be filled through [facility] - prescription redirected to their pharmacy on 05/31/20.   Oral Oncology Clinic will continue to follow for copayment issues, initial counseling and start date.  Leron Croak, PharmD, BCPS Hematology/Oncology Clinical Pharmacist [facility] [phone] 06/04/2020 1:49 PM

## 2020-06-04 NOTE — Telephone Encounter (Signed)
[facility] CSW Progress Notes  Third call to patient to address Distress Screen - unable to reach, left VM w generic information.  CSW will schedule to see her in infusion.  Closing referral at this time.   Edwyna Shell, LCSW Clinical Social Worker Phone:  [phone]

## 2020-06-04 NOTE — Telephone Encounter (Signed)
Oral Chemotherapy Pharmacist Encounter   Attempted to reach patient to provide update and offefr for initial counseling on oral medication: Revlimid (lenalidomide).   No answer. Left voicemail for patient to call back to discuss details of medication acquisition and initial counseling session.  Leron Croak, PharmD, BCPS Hematology/Oncology Clinical Pharmacist [facility] [phone] 06/04/2020 1:50 PM

## 2020-06-05 ENCOUNTER — Other Ambulatory Visit: Payer: Self-pay

## 2020-06-05 ENCOUNTER — Ambulatory Visit (HOSPITAL_COMMUNITY)
Admission: RE | Admit: 2020-06-05 | Discharge: 2020-06-05 | Disposition: A | Discharge status: Home / Self Care | Payer: BC Managed Care – PPO | Source: Ambulatory Visit | Attending: Hematology | Admitting: Hematology

## 2020-06-05 ENCOUNTER — Encounter (HOSPITAL_COMMUNITY): Payer: Self-pay

## 2020-06-05 DIAGNOSIS — C9000 Multiple myeloma not having achieved remission: Secondary | ICD-10-CM

## 2020-06-05 DIAGNOSIS — I10 Essential (primary) hypertension: Secondary | ICD-10-CM | POA: Insufficient documentation

## 2020-06-05 DIAGNOSIS — Z8249 Family history of ischemic heart disease and other diseases of the circulatory system: Secondary | ICD-10-CM | POA: Insufficient documentation

## 2020-06-05 DIAGNOSIS — Z79899 Other long term (current) drug therapy: Secondary | ICD-10-CM | POA: Diagnosis not present

## 2020-06-05 DIAGNOSIS — Z9049 Acquired absence of other specified parts of digestive tract: Secondary | ICD-10-CM | POA: Insufficient documentation

## 2020-06-05 DIAGNOSIS — Z87891 Personal history of nicotine dependence: Secondary | ICD-10-CM | POA: Diagnosis not present

## 2020-06-05 DIAGNOSIS — Z801 Family history of malignant neoplasm of trachea, bronchus and lung: Secondary | ICD-10-CM | POA: Insufficient documentation

## 2020-06-05 DIAGNOSIS — Z7982 Long term (current) use of aspirin: Secondary | ICD-10-CM | POA: Insufficient documentation

## 2020-06-05 HISTORY — DX: Malignant (primary) neoplasm, unspecified: C80.1

## 2020-06-05 HISTORY — PX: IR IMAGING GUIDED PORT INSERTION: IMG5740

## 2020-06-05 LAB — CBC WITH DIFFERENTIAL/PLATELET
Abs Immature Granulocytes: 0.05 10*3/uL (ref 0.00–0.07)
Basophils Absolute: 0 10*3/uL (ref 0.0–0.1)
Basophils Relative: 0 %
Eosinophils Absolute: 0.1 10*3/uL (ref 0.0–0.5)
Eosinophils Relative: 1 %
HCT: 33.7 % — ABNORMAL LOW (ref 36.0–46.0)
Hemoglobin: 10.3 g/dL — ABNORMAL LOW (ref 12.0–15.0)
Immature Granulocytes: 1 %
Lymphocytes Relative: 32 %
Lymphs Abs: 2.2 10*3/uL (ref 0.7–4.0)
MCH: 26.9 pg (ref 26.0–34.0)
MCHC: 30.6 g/dL (ref 30.0–36.0)
MCV: 88 fL (ref 80.0–100.0)
Monocytes Absolute: 0.6 10*3/uL (ref 0.1–1.0)
Monocytes Relative: 8 %
Neutro Abs: 4 10*3/uL (ref 1.7–7.7)
Neutrophils Relative %: 58 %
Platelets: 164 10*3/uL (ref 150–400)
RBC: 3.83 MIL/uL — ABNORMAL LOW (ref 3.87–5.11)
RDW: 19.2 % — ABNORMAL HIGH (ref 11.5–15.5)
WBC: 6.9 10*3/uL (ref 4.0–10.5)
nRBC: 0 % (ref 0.0–0.2)

## 2020-06-05 MED ORDER — LIDOCAINE-EPINEPHRINE 1 %-1:100000 IJ SOLN
INTRAMUSCULAR | Status: AC
  Filled 2020-06-05: qty 1

## 2020-06-05 MED ORDER — HEPARIN SOD (PORK) LOCK FLUSH 100 UNIT/ML IV SOLN
INTRAVENOUS | Status: AC
  Filled 2020-06-05: qty 5

## 2020-06-05 MED ORDER — HEPARIN SOD (PORK) LOCK FLUSH 100 UNIT/ML IV SOLN
INTRAVENOUS | Status: AC | PRN
  Administered 2020-06-05: 500 [IU] via INTRAVENOUS

## 2020-06-05 MED ORDER — MIDAZOLAM HCL 2 MG/2ML IJ SOLN
INTRAMUSCULAR | Status: AC | PRN
  Administered 2020-06-05: 2 mg via INTRAVENOUS
  Administered 2020-06-05: 1 mg via INTRAVENOUS

## 2020-06-05 MED ORDER — DIPHENHYDRAMINE HCL 50 MG/ML IJ SOLN
INTRAMUSCULAR | Status: AC | PRN
Start: 2020-06-05 — End: 2020-06-05
  Administered 2020-06-05: 25 mg via INTRAVENOUS

## 2020-06-05 MED ORDER — MIDAZOLAM HCL 2 MG/2ML IJ SOLN
INTRAMUSCULAR | Status: AC
  Filled 2020-06-05: qty 6

## 2020-06-05 MED ORDER — FENTANYL CITRATE (PF) 100 MCG/2ML IJ SOLN
INTRAMUSCULAR | Status: AC
  Filled 2020-06-05: qty 2

## 2020-06-05 MED ORDER — FENTANYL CITRATE (PF) 100 MCG/2ML IJ SOLN
INTRAMUSCULAR | Status: AC | PRN
  Administered 2020-06-05 (×2): 50 ug via INTRAVENOUS

## 2020-06-05 MED ORDER — SODIUM CHLORIDE 0.9 % IV SOLN: INTRAVENOUS | Status: DC

## 2020-06-05 MED ORDER — LIDOCAINE-EPINEPHRINE 1 %-1:100000 IJ SOLN
INTRAMUSCULAR | Status: AC | PRN
  Administered 2020-06-05: 10 mL

## 2020-06-05 MED ORDER — DIPHENHYDRAMINE HCL 50 MG/ML IJ SOLN
INTRAMUSCULAR | Status: AC
  Filled 2020-06-05: qty 1

## 2020-06-05 NOTE — Discharge Instructions (Signed)
Moderate Conscious Sedation, Adult, Care After This sheet gives you information about how to care for yourself after your procedure. Your health care provider may also give you more specific instructions. If you have problems or questions, contact your health care provider. What can I expect after the procedure? After the procedure, it is common to have:  Sleepiness for several hours.  Impaired judgment for several hours.  Difficulty with balance.  Vomiting if you eat too soon. Follow these instructions at home: For the time period you were told by your health care provider:  Rest.  Do not participate in activities where you could fall or become injured.  Do not drive or use machinery.  Do not drink alcohol.  Do not take sleeping pills or medicines that cause drowsiness.  Do not make important decisions or sign legal documents.  Do not take care of children on your own.      Eating and drinking  Follow the diet recommended by your health care provider.  Drink enough fluid to keep your urine pale yellow.  If you vomit: ? Drink water, juice, or soup when you can drink without vomiting. ? Make sure you have little or no nausea before eating solid foods.   General instructions  Take over-the-counter and prescription medicines only as told by your health care provider.  Have a responsible adult stay with you for the time you are told. It is important to have someone help care for you until you are awake and alert.  Do not smoke.  Keep all follow-up visits as told by your health care provider. This is important. Contact a health care provider if:  You are still sleepy or having trouble with balance after 24 hours.  You feel light-headed.  You keep feeling nauseous or you keep vomiting.  You develop a rash.  You have a fever.  You have redness or swelling around the IV site. Get help right away if:  You have trouble breathing.  You have new-onset confusion at  home. Summary  After the procedure, it is common to feel sleepy, have impaired judgment, or feel nauseous if you eat too soon.  Rest after you get home. Know the things you should not do after the procedure.  Follow the diet recommended by your health care provider and drink enough fluid to keep your urine pale yellow.  Get help right away if you have trouble breathing or new-onset confusion at home. This information is not intended to replace advice given to you by your health care provider. Make sure you discuss any questions you have with your health care provider. Document Revised: 07/21/2019 Document Reviewed: 02/16/2019 Elsevier Patient Education  2021 Else[employer]. Implanted Port Insertion, Care After This sheet gives you information about how to care for yourself after your procedure. Your health care provider may also give you more specific instructions. If you have problems or questions, contact your health care provider. What can I expect after the procedure? After the procedure, it is common to have:  Discomfort at the port insertion site.  Bruising on the skin over the port. This should improve over 3-4 days. Follow these instructions at home: Port care  After your port is placed, you will get a manufacturer's information card. The card has information about your port. Keep this card with you at all times.  Take care of the port as told by your health care provider. Ask your health care provider if you or a family member can   get training for taking care of the port at home. A home health care nurse may also take care of the port.  Make sure to remember what type of port you have. Incision care  Follow instructions from your health care provider about how to take care of your port insertion site. Make sure you: ? Wash your hands with soap and water before and after you change your bandage (dressing). If soap and water are not available, use hand sanitizer. ? Change your  dressing as told by your health care provider. ? Leave stitches (sutures), skin glue, or adhesive strips in place. These skin closures may need to stay in place for 2 weeks or longer. If adhesive strip edges start to loosen and curl up, you may trim the loose edges. Do not remove adhesive strips completely unless your health care provider tells you to do that.  Check your port insertion site every day for signs of infection. Check for: ? Redness, swelling, or pain. ? Fluid or blood. ? Warmth. ? Pus or a bad smell.      Activity  Return to your normal activities as told by your health care provider. Ask your health care provider what activities are safe for you.  Do not lift anything that is heavier than 10 lb (4.5 kg), or the limit that you are told, until your health care provider says that it is safe. General instructions  Take over-the-counter and prescription medicines only as told by your health care provider.  Do not take baths, swim, or use a hot tub until your health care provider approves. Ask your health care provider if you may take showers. You may only be allowed to take sponge baths.  Do not drive for 24 hours if you were given a sedative during your procedure.  Wear a medical alert bracelet in case of an emergency. This will tell any health care providers that you have a port.  Keep all follow-up visits as told by your health care provider. This is important. Contact a health care provider if:  You cannot flush your port with saline as directed, or you cannot draw blood from the port.  You have a fever or chills.  You have redness, swelling, or pain around your port insertion site.  You have fluid or blood coming from your port insertion site.  Your port insertion site feels warm to the touch.  You have pus or a bad smell coming from the port insertion site. Get help right away if:  You have chest pain or shortness of breath.  You have bleeding from your port  that you cannot control. Summary  Take care of the port as told by your health care provider. Keep the manufacturer's information card with you at all times.  Change your dressing as told by your health care provider.  Contact a health care provider if you have a fever or chills or if you have redness, swelling, or pain around your port insertion site.  Keep all follow-up visits as told by your health care provider. This information is not intended to replace advice given to you by your health care provider. Make sure you discuss any questions you have with your health care provider. Document Revised: 10/19/2017 Document Reviewed: 10/19/2017 Elsevier Patient Education  2021 Elsevier Inc.  

## 2020-06-05 NOTE — Telephone Encounter (Signed)
Albena Rhem spouse [name] arrived while patient undergoing port-a-cath insertion.  "Providing original FMLA request to correct.  Request declined as no frequency of absences provided.  It will only take a second to add now that we know her weekly two-day treatment plan."    Original form corrected with referral to interventional radiology for port-a-cath placement being placed today.  Myeloma transplant candidate treatment plan:  Kyprolis Day 1, 2, 8, 9, 15, 16 with Dexamethasone Day 22 and Revlimid Day 1 - 21.  All repeated every 28-days times 8 cycles.  Beginning Cycle 1 Day 1 tomorrow (06/06/2020).  Expected absence up to three days per week with duration up to eight hours.  Successfully faxed to [employer] [phone].

## 2020-06-05 NOTE — H&P (Signed)
Chief Complaint: Multiple myeloma  Referring Physician(s): Irene Limbo  Supervising Physician: Ruthann Cancer  Patient Status: [facility] - Out-pt  History of Present Illness: Megan Velasquez is a 60 y.o. female who is known to our service.  She has newly diagnosed multiple myeloma and underwent bone marrow biopsy by Dr. Laurence Ferrari on May 20, 2020.  She is here today for placement of a tunneled catheter with port for chemotherapy.  She is n.p.o.  Past Medical History:  Diagnosis Date  . Cancer ([place])   . GERD (gastroesophageal reflux disease)   . History of kidney stones   . HTN (hypertension)     Past Surgical History:  Procedure Laterality Date  . CHOLECYSTECTOMY    . COLONOSCOPY  01/2015   Dr. Britta Mccreedy: Mild diverticulosis, sessile polyp ranging 3 to 5 mm removed from the proximal transverse colon, semi-pedunculated polyp 5 to 9 mm in size removed from the sigmoid colon.  Sigmoid colon polyp was serrated adenoma, transverse colon polyp was adenomatous.  Patient was told to have another colonoscopy in 3 years.  . COLONOSCOPY WITH PROPOFOL N/A 10/11/2017   Procedure: COLONOSCOPY WITH PROPOFOL;  Surgeon: Daneil Dolin, MD;  Location: [facility] SU[facility];  Service: Endoscopy;  Laterality: N/A;  1:15pm  . POLYPECTOMY  10/11/2017   Procedure: POLYPECTOMY;  Surgeon: Daneil Dolin, MD;  Location: [facility] SUITE;  Service: Endoscopy;;  descending colon polyps cs times 2    Allergies: Patient has no known allergies.  Medications: Prior to Admission medications   Medication Sig Start Date End Date Taking? Authorizing Provider  acetaminophen (TYLENOL) 500 MG tablet Take 1,000 mg by mouth daily as needed for headache.    [provider]  acyclovir (ZOVIRAX) 400 MG tablet Take 1 tablet (400 mg total) by mouth 2 (two) times daily. 05/30/20   Brunetta Genera, MD  aspirin EC 81 MG tablet Take 81 mg by mouth daily.    [provider]  b complex vitamins capsule Take 1 capsule  by mouth daily. 04/25/20   Brunetta Genera, MD  B Complex-C (SUPER B COMPLEX PO) Take 1 capsule by mouth daily at 2 PM.    [provider]  Carboxymethylcellul-Glycerin (LUBRICATING EYE DROPS OP) Place 1 drop into the right eye 4 (four) times daily. Use for 3 days prior to surgery    [provider]  Cholecalciferol (VITAMIN D3 PO) Take by mouth. 02/20/17   [provider]  Cholecalciferol (VITAMIN D3) 5000 units CAPS Take 5,000 Units by mouth daily.    [provider]  Cyanocobalamin (VITAMIN B12) 3000 MCG/ML LIQD Place 3,000 mcg under the tongue daily. 04/25/20   Brunetta Genera, MD  dexamethasone (DECADRON) 4 MG tablet Take 5 tablets (20 mg total) by mouth once a week. 04/25/20   Brunetta Genera, MD  dexamethasone (DECADRON) 4 MG tablet Take 10 tablets (79m) once on day 22. Repeat every 28 days for the first 4 cycles. Take with breakfast. 05/30/20   KBrunetta Genera MD  lenalidomide (REVLIMID) 15 MG capsule Take 1 capsule (15 mg total) by mouth daily. Take for 21 days. Hold 7 days. Repeat every 28 days. [employer] aJosem Kaufmann##[id number] Date obtained 05/29/20 05/31/20   KBrunetta Genera MD  lidocaine-prilocaine (EMLA) cream Apply to affected area once 05/30/20   KBrunetta Genera MD  lisinopril-hydrochlorothiazide (PRINZIDE,ZESTORETIC) 10-12.5 MG tablet Take 1 tablet by mouth daily.    [provider]  loratadine (CLARITIN) 10 MG tablet Take 10  mg by mouth daily as needed for allergies.    [provider]  Loratadine 10 MG CAPS  10 mg =, daily, 0 Refill(s) 06/23/17   [provider]  LORazepam (ATIVAN) 0.5 MG tablet Take 1 tablet (0.5 mg total) by mouth every 6 (six) hours as needed (Nausea or vomiting). 05/30/20   Brunetta Genera, MD  omeprazole (PRILOSEC) 40 MG capsule Take by mouth. 01/31/20   [provider]  ondansetron (ZOFRAN) 8 MG tablet Take 1 tablet (8 mg total) by mouth 2 (two) times daily as needed  (Nausea or vomiting). 05/30/20   Brunetta Genera, MD  predniSONE (DELTASONE) 20 MG tablet Take 20 mg by mouth 2 (two) times daily. 02/16/20   [provider]  prochlorperazine (COMPAZINE) 10 MG tablet Take 1 tablet (10 mg total) by mouth every 6 (six) hours as needed (Nausea or vomiting). 05/30/20   Brunetta Genera, MD  vitamin C (ASCORBIC ACID) 500 MG tablet Take 500 mg by mouth daily.    [provider]     Family History  Problem Relation Age of Onset  . Heart disease Mother   . Diabetes Mother   . Lung cancer Father   . COPD Father   . Colon cancer Neg Hx     Social History   Socioeconomic History  . Marital status: Married    Spouse name: Not on file  . Number of children: Not on file  . Years of education: Not on file  . Highest education level: Not on file  Occupational History  . Not on file  Tobacco Use  . Smoking status: Former Smoker    Packs/day: 1.00    Years: 28.00    Pack years: 28.00    Types: Cigarettes    Quit date: 10/08/2013    Years since quitting: 6.6  . Smokeless tobacco: Never Used  Vaping Use  . Vaping Use: Never used  Substance and Sexual Activity  . Alcohol use: Not Currently  . Drug use: Never  . Sexual activity: Yes    Birth control/protection: Post-menopausal  Other Topics Concern  . Not on file  Social History Narrative  . Not on file   Social Determinants of Health   Financial Resource Strain: Not on file  Food Insecurity: Not on file  Transportation Needs: Not on file  Physical Activity: Not on file  Stress: Not on file  Social Connections: Not on file     Review of Systems: A 12 point ROS discussed and pertinent positives are indicated in the HPI above.  All other systems are negative.  Review of Systems  Vital Signs: BP 138/79   Pulse 96   Temp 98.5 F (36.9 C) (Oral)   Resp 18   SpO2 98%   Physical Exam Vitals reviewed.  Constitutional:      Appearance: Normal appearance.  HENT:      Head: Normocephalic and atraumatic.  Eyes:     Extraocular Movements: Extraocular movements intact.  Cardiovascular:     Rate and Rhythm: Normal rate and regular rhythm.  Pulmonary:     Effort: Pulmonary effort is normal. No respiratory distress.     Breath sounds: Normal breath sounds.  Abdominal:     General: There is no distension.     Palpations: Abdomen is soft.     Tenderness: There is no abdominal tenderness.  Musculoskeletal:        General: Normal range of motion.  Skin:    General:  Skin is warm and dry.  Neurological:     General: No focal deficit present.     Mental Status: She is alert and oriented to person, place, and time.  Psychiatric:        Mood and Affect: Mood normal.        Behavior: Behavior normal.        Thought Content: Thought content normal.        Judgment: Judgment normal.     Imaging: NM PET Image Initial (PI) Whole Body  Result Date: 05/17/2020 CLINICAL DATA:  Initial treatment strategy for multiple myeloma. EXAM: NUCLEAR MEDICINE PET WHOLE BODY TECHNIQUE: 7.0 mCi F-18 FDG was injected intravenously. Full-ring PET imaging was performed from the head to foot after the radiotracer. CT data was obtained and used for attenuation correction and anatomic localization. Fasting blood glucose: 98 mg/dl COMPARISON:  Multiple exams, including CT chest report from 01/23/2019 FINDINGS: Mediastinal blood pool activity: SUV max 2.4 HEAD/NECK: No significant abnormal hypermetabolic activity in this region. Incidental CT findings: Chronic right maxillary sinusitis. Bilateral common carotid atherosclerotic calcification. CHEST: Sharply defined 3.1 by 1.2 cm ground-glass density lesion in the left upper lobe on image 93 of series 4 is hypermetabolic with maximum SUV 11.4. A smaller focus of subpleural ground-glass opacity in the left upper lobe measuring 1.3 by 0.8 cm on image 103 of series 4 has a maximum SUV of 3.1. 0.6 by 0.4 cm left lower lobe nodule on image 115 of  series 4, without appreciable accentuated metabolic activity. Subpleural ground-glass nodularity in the left lower lobe measuring 0.9 by 0.6 cm without hypermetabolic activity on image 35 series 8. Mildly focally accentuated distal esophageal activity with maximum SUV 4.3. This is most commonly physiologic at this location. Incidental CT findings: Atherosclerotic calcification of the aortic arch and branch vessels. ABDOMEN/PELVIS: No significant abnormal hypermetabolic activity in this region. Incidental CT findings: Aortoiliac atherosclerotic vascular disease. Cholecystectomy. Sigmoid colon diverticulosis. Pelvic floor laxity with cystocele. SKELETON: Diffusely accentuated marrow activity throughout the axial skeleton and in much of the proximal appendicular skeleton, without focal lesions identified and without typical punched-out cortical lesions often encountered in the setting of myeloma. The appearance on today's exam could be from granulocyte stimulation (correlate with any history of minute stray shin of granulocyte stimulating pharmaceuticals) or CT occult marrow infiltration. Incidental CT findings: Mild degenerative anterolisthesis of L5 on S1. EXTREMITIES: No significant abnormal hypermetabolic activity in this region. Incidental CT findings: Subcutaneous edema along the heels. IMPRESSION: 1. A 03.1 by 1.2 cm ground-glass density in the left upper lobe is notably hypermetabolic with maximum SUV 11.4, and a smaller focus of subpleural ground-glass opacity in the left upper lobe is mildly hypermetabolic. Possibilities may include lepidic growth lung cancer or atypical infectious process. Tissue sampling may be warranted. 2. Generalized accentuated marrow activity in the axial skeleton and proximal appendicular skeleton, symmetric and without focal lesions on the CT data. This could be from granulocyte stimulation (correlate with any medication aimed at granulocyte stimulation) or CT occult marrow  infiltrative process. 3. Other imaging findings of potential clinical significance: Chronic right maxillary sinusitis. Aortic Atherosclerosis (ICD10-I70.0). Sigmoid colon diverticulosis. Pelvic floor laxity with cystocele. Electronically Signed   By: Van Clines M.D.   On: 05/17/2020 13:07   CT Biopsy  Result Date: 05/20/2020 INDICATION: 60 year old female with new diagnosis of multiple myeloma. Evaluate marrow involvement for staging. EXAM: CT GUIDED BONE MARROW ASPIRATION AND CORE BIOPSY Interventional Radiologist:  Criselda Peaches, MD MEDICATIONS: None. ANESTHESIA/SEDATION:  Moderate (conscious) sedation was employed during this procedure. A total of 2 milligrams versed and 100 micrograms fentanyl were administered intravenously. The patient's level of consciousness and vital signs were monitored continuously by radiology nursing throughout the procedure under my direct supervision. Total monitored sedation time: 10 minutes FLUOROSCOPY TIME:  None. COMPLICATIONS: None immediate. Estimated blood loss: <25 mL PROCEDURE: Informed written consent was obtained from the patient after a thorough discussion of the procedural risks, benefits and alternatives. All questions were addressed. Maximal Sterile Barrier Technique was utilized including caps, mask, sterile gowns, sterile gloves, sterile drape, hand hygiene and skin antiseptic. A timeout was performed prior to the initiation of the procedure. The patient was positioned prone and non-contrast localization CT was performed of the pelvis to demonstrate the iliac marrow spaces. Maximal barrier sterile technique utilized including caps, mask, sterile gowns, sterile gloves, large sterile drape, hand hygiene, and betadine prep. Under sterile conditions and local anesthesia, an 11 gauge coaxial bone biopsy needle was advanced into the right iliac marrow space. Needle position was confirmed with CT imaging. Initially, bone marrow aspiration was performed.  Next, the 11 gauge outer cannula was utilized to obtain a right iliac bone marrow core biopsy. Needle was removed. Hemostasis was obtained with compression. The patient tolerated the procedure well. Samples were prepared with the cytotechnologist. IMPRESSION: Technically successful CT-guided bone marrow aspiration and core biopsy of the right iliac bone. Electronically Signed   By: Jacqulynn Cadet M.D.   On: 05/20/2020 12:35   CT BONE MARROW BIOPSY & ASPIRATION  Result Date: 05/20/2020 INDICATION: 60 year old female with new diagnosis of multiple myeloma. Evaluate marrow involvement for staging. EXAM: CT GUIDED BONE MARROW ASPIRATION AND CORE BIOPSY Interventional Radiologist:  Criselda Peaches, MD MEDICATIONS: None. ANESTHESIA/SEDATION: Moderate (conscious) sedation was employed during this procedure. A total of 2 milligrams versed and 100 micrograms fentanyl were administered intravenously. The patient's level of consciousness and vital signs were monitored continuously by radiology nursing throughout the procedure under my direct supervision. Total monitored sedation time: 10 minutes FLUOROSCOPY TIME:  None. COMPLICATIONS: None immediate. Estimated blood loss: <25 mL PROCEDURE: Informed written consent was obtained from the patient after a thorough discussion of the procedural risks, benefits and alternatives. All questions were addressed. Maximal Sterile Barrier Technique was utilized including caps, mask, sterile gowns, sterile gloves, sterile drape, hand hygiene and skin antiseptic. A timeout was performed prior to the initiation of the procedure. The patient was positioned prone and non-contrast localization CT was performed of the pelvis to demonstrate the iliac marrow spaces. Maximal barrier sterile technique utilized including caps, mask, sterile gowns, sterile gloves, large sterile drape, hand hygiene, and betadine prep. Under sterile conditions and local anesthesia, an 11 gauge coaxial bone  biopsy needle was advanced into the right iliac marrow space. Needle position was confirmed with CT imaging. Initially, bone marrow aspiration was performed. Next, the 11 gauge outer cannula was utilized to obtain a right iliac bone marrow core biopsy. Needle was removed. Hemostasis was obtained with compression. The patient tolerated the procedure well. Samples were prepared with the cytotechnologist. IMPRESSION: Technically successful CT-guided bone marrow aspiration and core biopsy of the right iliac bone. Electronically Signed   By: Jacqulynn Cadet M.D.   On: 05/20/2020 12:35    Labs:  CBC: Recent Labs    04/16/20 1228 05/20/20 0745 06/05/20 1300  WBC 7.6 9.2 6.9  HGB 9.4* 9.9* 10.3*  HCT 30.0* 31.3* 33.7*  PLT 221 245 164    COAGS: Recent Labs  05/20/20 0745  INR 1.0    BMP: Recent Labs    04/16/20 1228  NA 141  K 4.6  CL 103  CO2 27  GLUCOSE 94  BUN 22*  CALCIUM 9.2  CREATININE 1.28*  GFRNONAA 48*    LIVER FUNCTION TESTS: Recent Labs    04/16/20 1228  BILITOT 0.3  AST 17  ALT 12  ALKPHOS 56  PROT 11.0*  ALBUMIN 3.0*    TUMOR MARKERS: No results for input(s): AFPTM, CEA, CA199, CHROMGRNA in the last 8760 hours.  Assessment and Plan:  Multiple myeloma.  We will proceed with placement of tunneled catheter with port today by Dr. Serafina Royals.  Risks and benefits of image guided port-a-catheter placement was discussed with the patient including, but not limited to bleeding, infection, pneumothorax, or fibrin sheath development and need for additional procedures.  All of the patient's questions were answered, patient is agreeable to proceed. Consent signed and in chart.  Thank you for this interesting consult.  I greatly enjoyed meeting Megan Velasquez and look forward to participating in their care.  A copy of this report was sent to the requesting provider on this date.  Electronically Signed: Murrell Redden, PA-C   06/05/2020, 2:03 PM      I spent  a total of    15 Minutes in face to face in clinical consultation, greater than 50% of which was counseling/coordinating care for image guided placement of tunneled catheter with port.

## 2020-06-05 NOTE — Telephone Encounter (Signed)
Oral Chemotherapy Pharmacist Encounter   Spoke with patient today to follow up regarding patient's oral chemotherapy medication: Revlimid (lenalidomide)  Patient confirmed she has spoken with [facility] and was provided a phone number to obtain copay card for Revlimid. Patient aware she is starting Revlimid on 06/06/20 and will need to set up shipment with [facility].  Will meet with patient in clinic on 06/06/20 to review Revlimid in detail. Patient did verify that she has picked up acyclovir, dexamethasone and antiemetics from [facility]. She knows to start acyclovir 06/06/20, and that she will receive dexamethasone in clinic tomorrow prior to receiving carfilzomib. We reviewed that the dexamethasone prescription she picked up from [facility] is to be taken on day 22, which is the week that she does not come into clinic to receive IV carfilzomib. Patient endorsed that she is currently taking a baby aspirin and understands to continue taking this due to risk of VTE with Revlimid.  Patient knows to call the office with questions or concerns. Patient provided with oral chemotherapy clinic phone number.   Leron Croak, PharmD, BCPS Hematology/Oncology Clinical Pharmacist [facility] [phone] 06/05/2020 9:36 AM

## 2020-06-05 NOTE — Progress Notes (Signed)
HEMATOLOGY/ONCOLOGY CONSULTATION NOTE  Date of Service: 06/05/2020  Patient Care Team: [name], MD as PCP - General (Family Medicine) [name], [name], MD as PCP - Cardiology (Cardiology) [name] [name], MD as Consulting Physician (Gastroenterology)  CHIEF COMPLAINTS/PURPOSE OF CONSULTATION:  Multiple Myeloma  HISTORY OF PRESENTING ILLNESS:   Megan Velasquez is a wonderful 60 y.o. female who has been referred to [place] for evaluation and management of multiple myeloma. Pt is accompanied today by her husband for this visit.   Patient was referred to [place] for second opinion regarding management of newly diagnosed multiple myeloma by Dr. Laurena Slimmer MD.  Patient had been referred to to [facility] cancer center by her primary care physician for anemia and elevated serum protein levels.  She also had some increasing fatigue and tingling in her hands and feet.  Noted primarily in the right wrist and right ankle.  B12 levels were noted to be borderline low.  She had a work-up including labs which showed hemoglobin of 9.7 with an MCV of 86.9 WBC count of 6.95k and platelets of 165k. CMP showed a creatinine of 1.06 calcium level of 8.9 total protein of 10.2 with an albumin of 2.7 normal liver function tests and other electrolytes. Serum protein electrophoresis with an M spike of 4.3 with an additional M spike of 0.2 g/dL.  IgA quantitative more than 6400. B12 levels 276, homocystine 15.9 Serum folate 26.5 LDH 96 Beta-2 microglobulin of 2.5  Serum kappa lambda free light chains showed free kappa light chains of 181, free lambda light chains of 3.6 and a free kappa lambda ratio of 50.3.  24-hour UPEP showed no M spike and a total protein of 5.7 mg/dL.  Bone Survey completed on 03/08/2020 with results revealing "No evidence of lytic nor blastic lesions within the appendicular or axial skeleton. Degenerative changes as described above."   On review of systems, pt reports some mild fatigue.  No  focal bone pains.  No chest pain.  No shortness of breath. No evidence of GI bleeding no hematuria no nosebleeds or gum bleeds. No other acute new focal symptoms. Denies any history of known cardiopulmonary disease strokes or heart attacks.  INTERVAL HISTORY  Megan Velasquez is a wonderful 60 y.o. female who is here today for evaluation and management of multiple myeloma. The patient's last visit with [place] was on 05/30/2020. The pt reports that she is doing well overall. We are joined today by her husband.  The pt reports that she had her Port placed yesterday and experienced no issues with this today in getting labs. The pt notes that the medications were sent to the wrong pharmacy, but they were able to still get it. The pt notes her preferred pharmacy is [facility] on [address], [place]. The pt notes that she will be picking the Revlimid up today after some confusion with insurance. The pt has received her FMLA and is able to take a period of time off work for treatment.   Lab results today 06/06/2020 of CBC w/diff and CMP is as follows: all values are WNL except for RBC of 3.40, Hgb of 9.1, HCT of 28.9, RDW of 19.2. CMP pending.  On review of systems, pt denies new bone pains, abdominal pain, back pain, leg swelling, and any other symptoms.  MEDICAL HISTORY:  Past Medical History:  Diagnosis Date  . GERD (gastroesophageal reflux disease)   . History of kidney stones   . HTN (hypertension)  SURGICAL HISTORY: Past Surgical History:  Procedure Laterality Date  . CHOLECYSTECTOMY    . COLONOSCOPY  01/2015   Dr. Britta Mccreedy: Mild diverticulosis, sessile polyp ranging 3 to 5 mm removed from the proximal transverse colon, semi-pedunculated polyp 5 to 9 mm in size removed from the sigmoid colon.  Sigmoid colon polyp was serrated adenoma, transverse colon polyp was adenomatous.  Patient was told to have another colonoscopy in 3 years.  . COLONOSCOPY WITH PROPOFOL N/A 10/11/2017   Procedure:  COLONOSCOPY WITH PROPOFOL;  Surgeon: Daneil Dolin, MD;  [facility]: [facility];  Service: Endoscopy;  Laterality: N/A;  1:15pm  . POLYPECTOMY  10/11/2017   Procedure: POLYPECTOMY;  Surgeon: Daneil Dolin, MD;  Location: [facility];  Service: Endoscopy;;  descending colon polyps cs times 2    SOCIAL HISTORY: Social History   Socioeconomic History  . Marital status: Married    Spouse name: Not on file  . Number of children: Not on file  . Years of education: Not on file  . Highest education level: Not on file  Occupational History  . Not on file  Tobacco Use  . Smoking status: Former Smoker    Packs/day: 1.00    Years: 28.00    Pack years: 28.00    Types: Cigarettes    Quit date: 10/08/2013    Years since quitting: 6.6  . Smokeless tobacco: Never Used  Vaping Use  . Vaping Use: Never used  Substance and Sexual Activity  . Alcohol use: Not Currently  . Drug use: Never  . Sexual activity: Yes    Birth control/protection: Post-menopausal  Other Topics Concern  . Not on file  Social History Narrative  . Not on file   Social Determinants of Health   Financial Resource Strain: Not on file  Food Insecurity: Not on file  Transportation Needs: Not on file  Physical Activity: Not on file  Stress: Not on file  Social Connections: Not on file  Intimate Partner Violence: Not on file    FAMILY HISTORY: Family History  Problem Relation Age of Onset  . Heart disease Mother   . Diabetes Mother   . Lung cancer Father   . COPD Father   . Colon cancer Neg Hx     ALLERGIES:  has No Known Allergies.  MEDICATIONS:  Current Outpatient Medications  Medication Sig Dispense Refill  . acetaminophen (TYLENOL) 500 MG tablet Take 1,000 mg by mouth daily as needed for headache.    Marland Kitchen acyclovir (ZOVIRAX) 400 MG tablet Take 1 tablet (400 mg total) by mouth 2 (two) times daily. 30 tablet 3  . aspirin EC 81 MG tablet Take 81 mg by mouth daily.    Marland Kitchen b complex vitamins capsule Take 1  capsule by mouth daily. 30 capsule 3  . B Complex-C (SUPER B COMPLEX PO) Take 1 capsule by mouth daily at 2 PM.    . Carboxymethylcellul-Glycerin (LUBRICATING EYE DROPS OP) Place 1 drop into the right eye 4 (four) times daily. Use for 3 days prior to surgery    . Cholecalciferol (VITAMIN D3 PO) Take by mouth.    . Cholecalciferol (VITAMIN D3) 5000 units CAPS Take 5,000 Units by mouth daily.    . Cyanocobalamin (VITAMIN B12) 3000 MCG/ML LIQD Place 3,000 mcg under the tongue daily. 30 mL 1  . dexamethasone (DECADRON) 4 MG tablet Take 5 tablets (20 mg total) by mouth once a week. 40 tablet 2  . dexamethasone (DECADRON) 4 MG tablet Take  10 tablets (66m) once on day 22. Repeat every 28 days for the first 4 cycles. Take with breakfast. 40 tablet 4  . lenalidomide (REVLIMID) 15 MG capsule Take 1 capsule (15 mg total) by mouth daily. Take for 21 days. Hold 7 days. Repeat every 28 days. Celgene aJosem Kaufmann##[id number] Date obtained 05/29/20 21 capsule 0  . lidocaine-prilocaine (EMLA) cream Apply to affected area once 30 g 3  . lisinopril-hydrochlorothiazide (PRINZIDE,ZESTORETIC) 10-12.5 MG tablet Take 1 tablet by mouth daily.    .Marland Kitchenloratadine (CLARITIN) 10 MG tablet Take 10 mg by mouth daily as needed for allergies.    . Loratadine 10 MG CAPS  10 mg =, daily, 0 Refill(s)    . LORazepam (ATIVAN) 0.5 MG tablet Take 1 tablet (0.5 mg total) by mouth every 6 (six) hours as needed (Nausea or vomiting). 30 tablet 0  . omeprazole (PRILOSEC) 40 MG capsule Take by mouth.    . ondansetron (ZOFRAN) 8 MG tablet Take 1 tablet (8 mg total) by mouth 2 (two) times daily as needed (Nausea or vomiting). 30 tablet 1  . predniSONE (DELTASONE) 20 MG tablet Take 20 mg by mouth 2 (two) times daily.    . prochlorperazine (COMPAZINE) 10 MG tablet Take 1 tablet (10 mg total) by mouth every 6 (six) hours as needed (Nausea or vomiting). 30 tablet 1  . vitamin C (ASCORBIC ACID) 500 MG tablet Take 500 mg by mouth daily.     No current  facility-administered medications for this visit.    REVIEW OF SYSTEMS:   10 Point review of Systems was done is negative except as noted above.  PHYSICAL EXAMINATION: ECOG PERFORMANCE STATUS: 1 - Symptomatic but completely ambulatory  . Vitals:   06/06/20 0958  BP: (!) 141/66  Pulse: 78  Resp: 18  Temp: 97.8 F (36.6 C)  SpO2: 100%   Filed Weights   06/06/20 0958  Weight: 139 lb 12.8 oz (63.4 kg)   .There is no height or weight on file to calculate BMI.   GENERAL:alert, in no acute distress and comfortable SKIN: no acute rashes, no significant lesions EYES: conjunctiva are pink and non-injected, sclera anicteric OROPHARYNX: MMM, no exudates, no oropharyngeal erythema or ulceration NECK: supple, no JVD LYMPH:  no palpable lymphadenopathy in the cervical, axillary or inguinal regions LUNGS: clear to auscultation b/l with normal respiratory effort HEART: regular rate & rhythm ABDOMEN:  normoactive bowel sounds , non tender, not distended. Extremity: no pedal edema PSYCH: alert & oriented x 3 with fluent speech NEURO: no focal motor/sensory deficits  LABORATORY DATA:  I have reviewed the data as listed  . CBC Latest Ref Rng & Units 05/20/2020 04/16/2020 10/08/2017  WBC 4.0 - 10.5 K/uL 9.2 7.6 7.2  Hemoglobin 12.0 - 15.0 g/dL 9.9(L) 9.4(L) 12.4  Hematocrit 36.0 - 46.0 % 31.3(L) 30.0(L) 37.5  Platelets 150 - 400 K/uL 245 221 216    . CMP Latest Ref Rng & Units 04/16/2020 10/08/2017  Glucose 70 - 99 mg/dL 94 86  BUN 6 - 20 mg/dL 22(H) 18  Creatinine 0.44 - 1.00 mg/dL 1.28(H) 0.96  Sodium 135 - 145 mmol/L 141 140  Potassium 3.5 - 5.1 mmol/L 4.6 3.6  Chloride 98 - 111 mmol/L 103 101  CO2 22 - 32 mmol/L 27 28  Calcium 8.9 - 10.3 mg/dL 9.2 9.1  Total Protein 6.5 - 8.1 g/dL 11.0(H) -  Total Bilirubin 0.3 - 1.2 mg/dL 0.3 -  Alkaline Phos 38 - 126 U/L 56 -  AST  15 - 41 U/L 17 -  ALT 0 - 44 U/L 12 -     Most recent lab results (03/15/2020) of CBC is as follows: all  values are WNL except for RBC at 3.66, Hgb at 9.7, HCT at 31.8, MCHC at 30.5, RDW Standard Deviation 58.7, Red Cell Distribution Width At 18.6,  BUN at 19, Creatinine at 1.06, Total Protein at 10.2, Albumin at 2.7, AST at 14,. 03/13/2020 UPEP shows no M-Spike, all values are WNL 03/06/2020 SPEP shows Total Protein at 10.1, Beta Globulin at 5.2, M-Spike at 4.3, Total Globulin at 6.4, A/G Ratio at 0.6 03/06/2020 Free Kappa Light Chains at 181.1, Free Lambda Light Chains Serum at 3.6, K/L ratio at 50.31 03/06/2020 Beta-2 Microglobulin at 2.5           RADIOGRAPHIC STUDIES: I have personally reviewed the radiological images as listed and agreed with the findings in the report. NM PET Image Initial (PI) Whole Body  Result Date: 05/17/2020 CLINICAL DATA:  Initial treatment strategy for multiple myeloma. EXAM: NUCLEAR MEDICINE PET WHOLE BODY TECHNIQUE: 7.0 mCi F-18 FDG was injected intravenously. Full-ring PET imaging was performed from the head to foot after the radiotracer. CT data was obtained and used for attenuation correction and anatomic localization. Fasting blood glucose: 98 mg/dl COMPARISON:  Multiple exams, including CT chest report from 01/23/2019 FINDINGS: Mediastinal blood pool activity: SUV max 2.4 HEAD/NECK: No significant abnormal hypermetabolic activity in this region. Incidental CT findings: Chronic right maxillary sinusitis. Bilateral common carotid atherosclerotic calcification. CHEST: Sharply defined 3.1 by 1.2 cm ground-glass density lesion in the left upper lobe on image 93 of series 4 is hypermetabolic with maximum SUV 11.4. A smaller focus of subpleural ground-glass opacity in the left upper lobe measuring 1.3 by 0.8 cm on image 103 of series 4 has a maximum SUV of 3.1. 0.6 by 0.4 cm left lower lobe nodule on image 115 of series 4, without appreciable accentuated metabolic activity. Subpleural ground-glass nodularity in the left lower lobe measuring 0.9 by 0.6 cm without  hypermetabolic activity on image 35 series 8. Mildly focally accentuated distal esophageal activity with maximum SUV 4.3. This is most commonly physiologic at this location. Incidental CT findings: Atherosclerotic calcification of the aortic arch and branch vessels. ABDOMEN/PELVIS: No significant abnormal hypermetabolic activity in this region. Incidental CT findings: Aortoiliac atherosclerotic vascular disease. Cholecystectomy. Sigmoid colon diverticulosis. Pelvic floor laxity with cystocele. SKELETON: Diffusely accentuated marrow activity throughout the axial skeleton and in much of the proximal appendicular skeleton, without focal lesions identified and without typical punched-out cortical lesions often encountered in the setting of myeloma. The appearance on today's exam could be from granulocyte stimulation (correlate with any history of minute stray shin of granulocyte stimulating pharmaceuticals) or CT occult marrow infiltration. Incidental CT findings: Mild degenerative anterolisthesis of L5 on S1. EXTREMITIES: No significant abnormal hypermetabolic activity in this region. Incidental CT findings: Subcutaneous edema along the heels. IMPRESSION: 1. A 03.1 by 1.2 cm ground-glass density in the left upper lobe is notably hypermetabolic with maximum SUV 11.4, and a smaller focus of subpleural ground-glass opacity in the left upper lobe is mildly hypermetabolic. Possibilities may include lepidic growth lung cancer or atypical infectious process. Tissue sampling may be warranted. 2. Generalized accentuated marrow activity in the axial skeleton and proximal appendicular skeleton, symmetric and without focal lesions on the CT data. This could be from granulocyte stimulation (correlate with any medication aimed at granulocyte stimulation) or CT occult marrow infiltrative process. 3. Other imaging findings of  potential clinical significance: Chronic right maxillary sinusitis. Aortic Atherosclerosis (ICD10-I70.0).  Sigmoid colon diverticulosis. Pelvic floor laxity with cystocele. Electronically Signed   By: Van Clines M.D.   On: 05/17/2020 13:07   CT Biopsy  Result Date: 05/20/2020 INDICATION: 60 year old female with new diagnosis of multiple myeloma. Evaluate marrow involvement for staging. EXAM: CT GUIDED BONE MARROW ASPIRATION AND CORE BIOPSY Interventional Radiologist:  Criselda Peaches, MD MEDICATIONS: None. ANESTHESIA/SEDATION: Moderate (conscious) sedation was employed during this procedure. A total of 2 milligrams versed and 100 micrograms fentanyl were administered intravenously. The patient's level of consciousness and vital signs were monitored continuously by radiology nursing throughout the procedure under my direct supervision. Total monitored sedation time: 10 minutes FLUOROSCOPY TIME:  None. COMPLICATIONS: None immediate. Estimated blood loss: <25 mL PROCEDURE: Informed written consent was obtained from the patient after a thorough discussion of the procedural risks, benefits and alternatives. All questions were addressed. Maximal Sterile Barrier Technique was utilized including caps, mask, sterile gowns, sterile gloves, sterile drape, hand hygiene and skin antiseptic. A timeout was performed prior to the initiation of the procedure. The patient was positioned prone and non-contrast localization CT was performed of the pelvis to demonstrate the iliac marrow spaces. Maximal barrier sterile technique utilized including caps, mask, sterile gowns, sterile gloves, large sterile drape, hand hygiene, and betadine prep. Under sterile conditions and local anesthesia, an 11 gauge coaxial bone biopsy needle was advanced into the right iliac marrow space. Needle position was confirmed with CT imaging. Initially, bone marrow aspiration was performed. Next, the 11 gauge outer cannula was utilized to obtain a right iliac bone marrow core biopsy. Needle was removed. Hemostasis was obtained with compression. The  patient tolerated the procedure well. Samples were prepared with the cytotechnologist. IMPRESSION: Technically successful CT-guided bone marrow aspiration and core biopsy of the right iliac bone. Electronically Signed   By: Jacqulynn Cadet M.D.   On: 05/20/2020 12:35   CT BONE MARROW BIOPSY & ASPIRATION  Result Date: 05/20/2020 INDICATION: 60 year old female with new diagnosis of multiple myeloma. Evaluate marrow involvement for staging. EXAM: CT GUIDED BONE MARROW ASPIRATION AND CORE BIOPSY Interventional Radiologist:  Criselda Peaches, MD MEDICATIONS: None. ANESTHESIA/SEDATION: Moderate (conscious) sedation was employed during this procedure. A total of 2 milligrams versed and 100 micrograms fentanyl were administered intravenously. The patient's level of consciousness and vital signs were monitored continuously by radiology nursing throughout the procedure under my direct supervision. Total monitored sedation time: 10 minutes FLUOROSCOPY TIME:  None. COMPLICATIONS: None immediate. Estimated blood loss: <25 mL PROCEDURE: Informed written consent was obtained from the patient after a thorough discussion of the procedural risks, benefits and alternatives. All questions were addressed. Maximal Sterile Barrier Technique was utilized including caps, mask, sterile gowns, sterile gloves, sterile drape, hand hygiene and skin antiseptic. A timeout was performed prior to the initiation of the procedure. The patient was positioned prone and non-contrast localization CT was performed of the pelvis to demonstrate the iliac marrow spaces. Maximal barrier sterile technique utilized including caps, mask, sterile gowns, sterile gloves, large sterile drape, hand hygiene, and betadine prep. Under sterile conditions and local anesthesia, an 11 gauge coaxial bone biopsy needle was advanced into the right iliac marrow space. Needle position was confirmed with CT imaging. Initially, bone marrow aspiration was performed. Next,  the 11 gauge outer cannula was utilized to obtain a right iliac bone marrow core biopsy. Needle was removed. Hemostasis was obtained with compression. The patient tolerated the procedure well. Samples were prepared with  the cytotechnologist. IMPRESSION: Technically successful CT-guided bone marrow aspiration and core biopsy of the right iliac bone. Electronically Signed   By: Jacqulynn Cadet M.D.   On: 05/20/2020 12:35    ASSESSMENT & PLAN:   60 year old female with  #1 Newly diagnosed multiple myeloma -IgA kappa.  Presenting with anemia hemoglobin today 9.4. Creatinine 1.28 No hypercalcemia Bone survey with no obvious skeletal lesions. Baseline M spike of 4.3 g/dL. Beta-2 Microglobulin at 2.5 Mol Cy Monosomy 13 and dup 1q PLAN: -Discussed pt labwork today, 06/06/2020; mild anemia that is getting worse. Chemistries revewed -Discussed risks of Dexamethasone and Revlimid. Advised pt we would start with one dose reduction of Revlimid for first cycle prior to bumping up to standard dose after given no issues tolerating. -Advised pt we can switch daily Baby ASA to preventative dose of Eliquis due to familial history of blood clots. Advised that doubling up on ASA is not typically recommended. The pt is agreeable of this switch to Eliquis for additional protection being on Revlimid. -Discussed ASA resistance and advised pt this is not a concern in this context for the pt.  -Advised pt to only take Dexamethasone on the week she is not getting her Revlimid treatment. This would be D22. -Advised pt we will be getting labs on D,8,15. Will repeat MMP once monthly. Will be able to get a better sense of response in second month not the first.  -Discussed moving towards full dose Revlimid in the second and third cycle given tolerating well. -Recommended pt stay physically active, drink 48-64 oz water daily, and eat healthy. Make sure to get rest. -Discussed goal of VGPR in 4-6 cycles.  -Recommended pt  ask scheduling for treatment calendar for clarity. -Discussed one cycle of being 6 doses of Carfilzomib and 21 days on Revlimid, 7 off.  -Will see back in 1 week with labs.  FOLLOW UP: Please add MD visit with cycle 1 day 8 of carfilzomib treatment in 1 week.   All of the patients questions were answered with apparent satisfaction. The patient knows to call the clinic with any problems, questions or concerns.  The total time spent in the appointment was 30 minutes and more than 50% was on counseling and direct patient cares.    Sullivan Lone MD [place] AAHIVMS [facility] Hematology/Oncology Physician [facility]  (Office):       [phone] (Work cell):  [phone] (Fax):           [phone]  06/05/2020 9:53 AM  I, Reinaldo Raddle, am acting as scribe for Dr. Sullivan Lone, MD.    .I have reviewed the above documentation for accuracy and completeness, and I agree with the above. Brunetta Genera MD

## 2020-06-05 NOTE — Procedures (Signed)
Interventional Radiology Procedure Note ° °Procedure: Single Lumen Power Port Placement   ° °Access:  Right internal jugular vein ° °Findings: Catheter tip positioned at cavoatrial junction. Port is ready for immediate use.  ° °Complications: None ° °EBL: < 10 mL ° °Recommendations:  °- Ok to shower in 24 hours °- Do not submerge for 7 days °- Routine line care  ° ° °Prisila Dlouhy, MD ° ° ° °

## 2020-06-06 ENCOUNTER — Inpatient Hospital Stay: Payer: BC Managed Care – PPO | Admitting: General Practice

## 2020-06-06 ENCOUNTER — Inpatient Hospital Stay: Payer: BC Managed Care – PPO

## 2020-06-06 ENCOUNTER — Inpatient Hospital Stay: Payer: BC Managed Care – PPO | Attending: Hematology | Admitting: Hematology

## 2020-06-06 ENCOUNTER — Other Ambulatory Visit: Payer: Self-pay

## 2020-06-06 ENCOUNTER — Ambulatory Visit: Payer: BC Managed Care – PPO

## 2020-06-06 VITALS — BP 141/66 | HR 78 | Temp 97.8°F | Resp 18 | Ht 65.0 in | Wt 139.8 lb

## 2020-06-06 DIAGNOSIS — C9000 Multiple myeloma not having achieved remission: Secondary | ICD-10-CM

## 2020-06-06 DIAGNOSIS — Z79899 Other long term (current) drug therapy: Secondary | ICD-10-CM | POA: Insufficient documentation

## 2020-06-06 DIAGNOSIS — Z7189 Other specified counseling: Secondary | ICD-10-CM

## 2020-06-06 DIAGNOSIS — Z5112 Encounter for antineoplastic immunotherapy: Secondary | ICD-10-CM | POA: Insufficient documentation

## 2020-06-06 DIAGNOSIS — Z5111 Encounter for antineoplastic chemotherapy: Secondary | ICD-10-CM | POA: Diagnosis not present

## 2020-06-06 DIAGNOSIS — Z95828 Presence of other vascular implants and grafts: Secondary | ICD-10-CM | POA: Insufficient documentation

## 2020-06-06 LAB — CBC WITH DIFFERENTIAL (CANCER CENTER ONLY)
Abs Immature Granulocytes: 0.05 10*3/uL (ref 0.00–0.07)
Basophils Absolute: 0 10*3/uL (ref 0.0–0.1)
Basophils Relative: 0 %
Eosinophils Absolute: 0.1 10*3/uL (ref 0.0–0.5)
Eosinophils Relative: 2 %
HCT: 28.9 % — ABNORMAL LOW (ref 36.0–46.0)
Hemoglobin: 9.1 g/dL — ABNORMAL LOW (ref 12.0–15.0)
Immature Granulocytes: 1 %
Lymphocytes Relative: 29 %
Lymphs Abs: 2.2 10*3/uL (ref 0.7–4.0)
MCH: 26.8 pg (ref 26.0–34.0)
MCHC: 31.5 g/dL (ref 30.0–36.0)
MCV: 85 fL (ref 80.0–100.0)
Monocytes Absolute: 0.5 10*3/uL (ref 0.1–1.0)
Monocytes Relative: 6 %
Neutro Abs: 4.7 10*3/uL (ref 1.7–7.7)
Neutrophils Relative %: 62 %
Platelet Count: 163 10*3/uL (ref 150–400)
RBC: 3.4 MIL/uL — ABNORMAL LOW (ref 3.87–5.11)
RDW: 19.2 % — ABNORMAL HIGH (ref 11.5–15.5)
WBC Count: 7.5 10*3/uL (ref 4.0–10.5)
nRBC: 0 % (ref 0.0–0.2)

## 2020-06-06 LAB — CMP (CANCER CENTER ONLY)
ALT: 9 U/L (ref 0–44)
AST: 12 U/L — ABNORMAL LOW (ref 15–41)
Albumin: 2.8 g/dL — ABNORMAL LOW (ref 3.5–5.0)
Alkaline Phosphatase: 54 U/L (ref 38–126)
Anion gap: 10 (ref 5–15)
BUN: 16 mg/dL (ref 6–20)
CO2: 24 mmol/L (ref 22–32)
Calcium: 8.3 mg/dL — ABNORMAL LOW (ref 8.9–10.3)
Chloride: 103 mmol/L (ref 98–111)
Creatinine: 0.98 mg/dL (ref 0.44–1.00)
GFR, Estimated: >60 mL/min (ref >60)
Glucose, Bld: 126 mg/dL — ABNORMAL HIGH (ref 70–99)
Potassium: 3.8 mmol/L (ref 3.5–5.1)
Sodium: 137 mmol/L (ref 135–145)
Total Bilirubin: 0.3 mg/dL (ref 0.3–1.2)
Total Protein: 9.4 g/dL — ABNORMAL HIGH (ref 6.5–8.1)

## 2020-06-06 MED ORDER — SODIUM CHLORIDE 0.9% FLUSH
10.0000 mL | Freq: Once | INTRAVENOUS | Status: DC
  Filled 2020-06-06: qty 10

## 2020-06-06 MED ORDER — HEPARIN SOD (PORK) LOCK FLUSH 100 UNIT/ML IV SOLN
500.0000 [IU] | Freq: Once | INTRAVENOUS | Status: AC | PRN
  Administered 2020-06-06: 500 [IU]
  Filled 2020-06-06: qty 5

## 2020-06-06 MED ORDER — DEXTROSE 5 % IV SOLN
20.0000 mg/m2 | Freq: Once | INTRAVENOUS | Status: AC
  Administered 2020-06-06: 34 mg via INTRAVENOUS
  Filled 2020-06-06: qty 15

## 2020-06-06 MED ORDER — APIXABAN 2.5 MG PO TABS: 2.5000 mg | ORAL_TABLET | Freq: Two times a day (BID) | ORAL | 5 refills | Status: DC

## 2020-06-06 MED ORDER — SODIUM CHLORIDE 0.9 % IV SOLN
40.0000 mg | Freq: Once | INTRAVENOUS | Status: AC
  Administered 2020-06-06: 40 mg via INTRAVENOUS
  Filled 2020-06-06: qty 4

## 2020-06-06 MED ORDER — SODIUM CHLORIDE 0.9% FLUSH
10.0000 mL | INTRAVENOUS | Status: DC | PRN
  Administered 2020-06-06: 10 mL
  Filled 2020-06-06: qty 10

## 2020-06-06 MED ORDER — SODIUM CHLORIDE 0.9 % IV SOLN
Freq: Once | INTRAVENOUS | Status: AC
  Filled 2020-06-06: qty 250

## 2020-06-06 NOTE — Progress Notes (Signed)
[facility] Psychosocial Distress Screening Clinical Social Work  Clinical Social Work was referred by distress screening protocol.  The patient scored a 5 on the Psychosocial Distress Thermometer which indicates moderate distress. Clinical Social Worker met with patient in infusion to assess for distress and other psychosocial needs. Patient reports she is doing well.  Has good support, feeling well overall.  CSW and patient discussed common feeling and emotions when being diagnosed with cancer, and the importance of support during treatment. CSW informed patient of the support team and support services at [facility]. CSW provided contact information and encouraged patient to call with any questions or concerns.  Major concern is timing of [facility] appointments - husband works third shift, wants to accompany patient to appointments/drive her, wants 6:23 AM appointments.  Asked to speak w RN about this request.      ONCBCN DISTRESS SCREENING 06/06/2020  Screening Type Initial Screening  Distress experienced in past week (1-10) 5  Emotional problem type Nervousness/Anxiety;Adjusting to illness  Information Concerns Type   Referral to clinical social work    Clinical Social Worker follow up needed: No.  If yes, follow up plan:  Beverely Pace, [place], LCSW Clinical Social Worker Phone:  [phone]

## 2020-06-06 NOTE — Patient Instructions (Addendum)
[facility] Discharge Instructions for Patients Receiving Chemotherapy  Today you received the following chemotherapy agents: carfilzomib.  To help prevent nausea and vomiting after your treatment, we encourage you to take your nausea medication as directed.   If you develop nausea and vomiting that is not controlled by your nausea medication, call the clinic.   BELOW ARE SYMPTOMS THAT SHOULD BE REPORTED IMMEDIATELY:  *FEVER GREATER THAN 100.5 F  *CHILLS WITH OR WITHOUT FEVER  NAUSEA AND VOMITING THAT IS NOT CONTROLLED WITH YOUR NAUSEA MEDICATION  *UNUSUAL SHORTNESS OF BREATH  *UNUSUAL BRUISING OR BLEEDING  TENDERNESS IN MOUTH AND THROAT WITH OR WITHOUT PRESENCE OF ULCERS  *URINARY PROBLEMS  *BOWEL PROBLEMS  UNUSUAL RASH Items with * indicate a potential emergency and should be followed up as soon as possible.  Feel free to call the clinic should you have any questions or concerns. The clinic phone number is [phone].  Please show the [facility] at check-in to the Emergency Department and triage nurse.  Carfilzomib injection What is this medicine? CARFILZOMIB (kar FILZ oh mib) targets a specific protein within cancer cells and stops the cancer cells from growing. It is used to treat multiple myeloma. This medicine may be used for other purposes; ask your health care provider or pharmacist if you have questions. COMMON BRAND NAME(S): KYPROLIS What should I tell my health care provider before I take this medicine? They need to know if you have any of these conditions:  heart disease  history of blood clots  irregular heartbeat  kidney disease  liver disease  lung or breathing disease  an unusual or allergic reaction to carfilzomib, or other medicines, foods, dyes, or preservatives  pregnant or trying to get pregnant  breast-feeding How should I use this medicine? This medicine is for injection or infusion into a vein. It is given by  a health care professional in a hospital or clinic setting. Talk to your pediatrician regarding the use of this medicine in children. Special care may be needed. Overdosage: If you think you have taken too much of this medicine contact a poison control center or emergency room at once. NOTE: This medicine is only for you. Do not share this medicine with others. What if I miss a dose? It is important not to miss your dose. Call your doctor or health care professional if you are unable to keep an appointment. What may interact with this medicine? Interactions are not expected. Give your health care provider a list of all the medicines, herbs, non-prescription drugs, or dietary supplements you use. Also tell them if you smoke, drink alcohol, or use illegal drugs. Some items may interact with your medicine. This list may not describe all possible interactions. Give your health care provider a list of all the medicines, herbs, non-prescription drugs, or dietary supplements you use. Also tell them if you smoke, drink alcohol, or use illegal drugs. Some items may interact with your medicine. What should I watch for while using this medicine? Your condition will be monitored carefully while you are receiving this medicine. Report any side effects. Continue your course of treatment even though you feel ill unless your doctor tells you to stop. You may need blood work done while you are taking this medicine. Do not become pregnant while taking this medicine or for at least 6 months after stopping it. Women should inform their doctor if they wish to become pregnant or think they might be pregnant. There is a  potential for serious side effects to an unborn child. Men should not father a child while taking this medicine and for at least 3 months after stopping it. Talk to your health care professional or pharmacist for more information. Do not breast-feed an infant while taking this medicine or for 2 weeks after the  last dose. Check with your doctor or health care professional if you get an attack of severe diarrhea, nausea and vomiting, or if you sweat a lot. The loss of too much body fluid can make it dangerous for you to take this medicine. You may get dizzy. Do not drive, use machinery, or do anything that needs mental alertness until you know how this medicine affects you. Do not stand or sit up quickly, especially if you are an older patient. This reduces the risk of dizzy or fainting spells. What side effects may I notice from receiving this medicine? Side effects that you should report to your doctor or health care professional as soon as possible:  allergic reactions like skin rash, itching or hives, swelling of the face, lips, or tongue  confusion  dizziness  feeling faint or lightheaded  fever or chills  palpitations  seizures  signs and symptoms of bleeding such as bloody or black, tarry stools; red or dark-brown urine; spitting up blood or brown material that looks like coffee grounds; red spots on the skin; unusual bruising or bleeding including from the eye, gums, or nose  signs and symptoms of a blood clot such as breathing problems; changes in vision; chest pain; severe, sudden headache; pain, swelling, warmth in the leg; trouble speaking; sudden numbness or weakness of the face, arm or leg  signs and symptoms of kidney injury like trouble passing urine or change in the amount of urine  signs and symptoms of liver injury like dark yellow or brown urine; general ill feeling or flu-like symptoms; light-colored stools; loss of appetite; nausea; right upper belly pain; unusually weak or tired; yellowing of the eyes or skin Side effects that usually do not require medical attention (report to your doctor or health care professional if they continue or are bothersome):  back pain  cough  diarrhea  headache  muscle cramps  trouble sleeping  vomiting This list may not describe  all possible side effects. Call your doctor for medical advice about side effects. You may report side effects to FDA at 1-800-FDA-1088. Where should I keep my medicine? This drug is given in a hospital or clinic and will not be stored at home. NOTE: This sheet is a summary. It may not cover all possible information. If you have questions about this medicine, talk to your doctor, pharmacist, or health care provider.  2021 Elsevier/Gold Standard (2018-11-28 19:44:21)

## 2020-06-06 NOTE — Telephone Encounter (Signed)
Oral Chemotherapy Pharmacist Encounter  I met with patient and her husband in clinic for overview of: Revlimid for the treatment of multiple myeloma in conjunction with carfilzomib and dexamethasone, planned duration ~8 cycles.   Counseled patient on administration, dosing, side effects, monitoring, drug-food interactions, safe handling, storage, and disposal.  Patient will take Revlimid 59m capsules, 1 capsule by mouth once daily, without regard to food, with a full glass of water.  Revlimid will be given 21 days on, 7 days off, repeat every 28 days.  Patient will take dexamethasone 469mtablets, 10 tablets (4041mby mouth on day 22 of each cycle with breakfast. (Dexamethasone all other weeks will be received in clinic)  Revlimid start date: 06/06/20  Adverse effects of Revlimid include but are not limited to: nausea, constipation, diarrhea, abdominal pain, rash, fatigue, drug fever, and decreased blood counts.    Reviewed with patient importance of keeping a medication schedule and plan for any missed doses. No barriers to medication adherence identified.  Medication reconciliation performed and medication/allergy list updated.  Patient has picked up acyclovir and dexamethasone prescriptions. Patient knows to only take dexamethasone [facility] that [facility] cycle since she receives dexamethasone on other weeks in clinic.    Patient will take apixaban 2.5 mg tablets, 1 tablet by mouth twice daily for VTE prophylaxis, discussed with patient monitoring for s/sx of increased bleeding including but not limited to blood in urine, stool, coughing up blood, bruising.   Insurance authorization for Revlimid has been obtained.  Revlimid prescription is being dispensed from [facility] pharmacy as it is a limited distribution medication.  All questions answered.  Ms. KeaVahleiced understanding and appreciation.   Medication education handout and  medication calendar given to patient. Patient knows to call the office with questions or concerns. Oral Chemotherapy Clinic phone number provided to patient.   RebLeron CroakharmD, BCPS Hematology/Oncology Clinical Pharmacist [id number] [phone] [id number]/2022 12:40 PM

## 2020-06-07 ENCOUNTER — Other Ambulatory Visit: Payer: Self-pay

## 2020-06-07 ENCOUNTER — Inpatient Hospital Stay: Payer: BC Managed Care – PPO

## 2020-06-07 VITALS — BP 151/62 | HR 91 | Temp 98.9°F | Resp 18

## 2020-06-07 DIAGNOSIS — C9000 Multiple myeloma not having achieved remission: Secondary | ICD-10-CM

## 2020-06-07 DIAGNOSIS — Z7189 Other specified counseling: Secondary | ICD-10-CM

## 2020-06-07 DIAGNOSIS — E860 Dehydration: Secondary | ICD-10-CM | POA: Diagnosis not present

## 2020-06-07 MED ORDER — PROCHLORPERAZINE MALEATE 10 MG PO TABS
10.0000 mg | ORAL_TABLET | Freq: Once | ORAL | Status: AC
  Administered 2020-06-07: 10 mg via ORAL

## 2020-06-07 MED ORDER — DEXTROSE 5 % IV SOLN
20.0000 mg/m2 | Freq: Once | INTRAVENOUS | Status: AC
  Administered 2020-06-07: 34 mg via INTRAVENOUS
  Filled 2020-06-07: qty 15

## 2020-06-07 MED ORDER — SODIUM CHLORIDE 0.9 % IV SOLN
Freq: Once | INTRAVENOUS | Status: AC
  Filled 2020-06-07: qty 250

## 2020-06-07 MED ORDER — HEPARIN SOD (PORK) LOCK FLUSH 100 UNIT/ML IV SOLN
500.0000 [IU] | Freq: Once | INTRAVENOUS | Status: AC | PRN
  Administered 2020-06-07: 500 [IU]
  Filled 2020-06-07: qty 5

## 2020-06-07 MED ORDER — SODIUM CHLORIDE 0.9% FLUSH
10.0000 mL | INTRAVENOUS | Status: DC | PRN
  Administered 2020-06-07: 10 mL
  Filled 2020-06-07: qty 10

## 2020-06-07 MED ORDER — PROCHLORPERAZINE MALEATE 10 MG PO TABS
ORAL_TABLET | ORAL | Status: AC
  Filled 2020-06-07: qty 1

## 2020-06-07 NOTE — Patient Instructions (Signed)
Carfilzomib injection What is this medicine? CARFILZOMIB (kar FILZ oh mib) targets a specific protein within cancer cells and stops the cancer cells from growing. It is used to treat multiple myeloma. This medicine may be used for other purposes; ask your health care provider or pharmacist if you have questions. COMMON BRAND NAME(S): KYPROLIS What should I tell my health care provider before I take this medicine? They need to know if you have any of these conditions:  heart disease  history of blood clots  irregular heartbeat  kidney disease  liver disease  lung or breathing disease  an unusual or allergic reaction to carfilzomib, or other medicines, foods, dyes, or preservatives  pregnant or trying to get pregnant  breast-feeding How should I use this medicine? This medicine is for injection or infusion into a vein. It is given by a health care professional in a hospital or clinic setting. Talk to your pediatrician regarding the use of this medicine in children. Special care may be needed. Overdosage: If you think you have taken too much of this medicine contact a poison control center or emergency room at once. NOTE: This medicine is only for you. Do not share this medicine with others. What if I miss a dose? It is important not to miss your dose. Call your doctor or health care professional if you are unable to keep an appointment. What may interact with this medicine? Interactions are not expected. Give your health care provider a list of all the medicines, herbs, non-prescription drugs, or dietary supplements you use. Also tell them if you smoke, drink alcohol, or use illegal drugs. Some items may interact with your medicine. This list may not describe all possible interactions. Give your health care provider a list of all the medicines, herbs, non-prescription drugs, or dietary supplements you use. Also tell them if you smoke, drink alcohol, or use illegal drugs. Some items  may interact with your medicine. What should I watch for while using this medicine? Your condition will be monitored carefully while you are receiving this medicine. Report any side effects. Continue your course of treatment even though you feel ill unless your doctor tells you to stop. You may need blood work done while you are taking this medicine. Do not become pregnant while taking this medicine or for at least 6 months after stopping it. Women should inform their doctor if they wish to become pregnant or think they might be pregnant. There is a potential for serious side effects to an unborn child. Men should not father a child while taking this medicine and for at least 3 months after stopping it. Talk to your health care professional or pharmacist for more information. Do not breast-feed an infant while taking this medicine or for 2 weeks after the last dose. Check with your doctor or health care professional if you get an attack of severe diarrhea, nausea and vomiting, or if you sweat a lot. The loss of too much body fluid can make it dangerous for you to take this medicine. You may get dizzy. Do not drive, use machinery, or do anything that needs mental alertness until you know how this medicine affects you. Do not stand or sit up quickly, especially if you are an older patient. This reduces the risk of dizzy or fainting spells. What side effects may I notice from receiving this medicine? Side effects that you should report to your doctor or health care professional as soon as possible:  allergic reactions like skin  rash, itching or hives, swelling of the face, lips, or tongue  confusion  dizziness  feeling faint or lightheaded  fever or chills  palpitations  seizures  signs and symptoms of bleeding such as bloody or black, tarry stools; red or dark-brown urine; spitting up blood or brown material that looks like coffee grounds; red spots on the skin; unusual bruising or bleeding  including from the eye, gums, or nose  signs and symptoms of a blood clot such as breathing problems; changes in vision; chest pain; severe, sudden headache; pain, swelling, warmth in the leg; trouble speaking; sudden numbness or weakness of the face, arm or leg  signs and symptoms of kidney injury like trouble passing urine or change in the amount of urine  signs and symptoms of liver injury like dark yellow or brown urine; general ill feeling or flu-like symptoms; light-colored stools; loss of appetite; nausea; right upper belly pain; unusually weak or tired; yellowing of the eyes or skin Side effects that usually do not require medical attention (report to your doctor or health care professional if they continue or are bothersome):  back pain  cough  diarrhea  headache  muscle cramps  trouble sleeping  vomiting This list may not describe all possible side effects. Call your doctor for medical advice about side effects. You may report side effects to FDA at 1-800-FDA-1088. Where should I keep my medicine? This drug is given in a hospital or clinic and will not be stored at home. NOTE: This sheet is a summary. It may not cover all possible information. If you have questions about this medicine, talk to your doctor, pharmacist, or health care provider.  2021 Elsevier/Gold Standard (2018-11-28 19:44:21)

## 2020-06-09 ENCOUNTER — Inpatient Hospital Stay (HOSPITAL_COMMUNITY)
Admission: EM | Admit: 2020-06-09 | Discharge: 2020-06-14 | DRG: 641 | Disposition: A | Discharge status: Home / Self Care | Payer: BC Managed Care – PPO | Attending: Internal Medicine | Admitting: Internal Medicine

## 2020-06-09 ENCOUNTER — Emergency Department (HOSPITAL_COMMUNITY): Payer: BC Managed Care – PPO

## 2020-06-09 ENCOUNTER — Encounter (HOSPITAL_COMMUNITY): Payer: Self-pay

## 2020-06-09 ENCOUNTER — Other Ambulatory Visit: Payer: Self-pay

## 2020-06-09 DIAGNOSIS — E876 Hypokalemia: Secondary | ICD-10-CM | POA: Diagnosis present

## 2020-06-09 DIAGNOSIS — Z801 Family history of malignant neoplasm of trachea, bronchus and lung: Secondary | ICD-10-CM

## 2020-06-09 DIAGNOSIS — D649 Anemia, unspecified: Secondary | ICD-10-CM | POA: Diagnosis present

## 2020-06-09 DIAGNOSIS — Z825 Family history of asthma and other chronic lower respiratory diseases: Secondary | ICD-10-CM

## 2020-06-09 DIAGNOSIS — D638 Anemia in other chronic diseases classified elsewhere: Secondary | ICD-10-CM | POA: Diagnosis present

## 2020-06-09 DIAGNOSIS — C9000 Multiple myeloma not having achieved remission: Secondary | ICD-10-CM | POA: Diagnosis present

## 2020-06-09 DIAGNOSIS — T451X5A Adverse effect of antineoplastic and immunosuppressive drugs, initial encounter: Secondary | ICD-10-CM | POA: Diagnosis present

## 2020-06-09 DIAGNOSIS — Z87442 Personal history of urinary calculi: Secondary | ICD-10-CM

## 2020-06-09 DIAGNOSIS — K219 Gastro-esophageal reflux disease without esophagitis: Secondary | ICD-10-CM | POA: Diagnosis present

## 2020-06-09 DIAGNOSIS — I10 Essential (primary) hypertension: Secondary | ICD-10-CM | POA: Diagnosis present

## 2020-06-09 DIAGNOSIS — Z7901 Long term (current) use of anticoagulants: Secondary | ICD-10-CM

## 2020-06-09 DIAGNOSIS — Z87891 Personal history of nicotine dependence: Secondary | ICD-10-CM

## 2020-06-09 DIAGNOSIS — D6959 Other secondary thrombocytopenia: Secondary | ICD-10-CM | POA: Diagnosis present

## 2020-06-09 DIAGNOSIS — E860 Dehydration: Secondary | ICD-10-CM | POA: Diagnosis not present

## 2020-06-09 DIAGNOSIS — F419 Anxiety disorder, unspecified: Secondary | ICD-10-CM | POA: Diagnosis present

## 2020-06-09 DIAGNOSIS — Z20822 Contact with and (suspected) exposure to covid-19: Secondary | ICD-10-CM | POA: Diagnosis present

## 2020-06-09 DIAGNOSIS — Z833 Family history of diabetes mellitus: Secondary | ICD-10-CM

## 2020-06-09 DIAGNOSIS — Z8249 Family history of ischemic heart disease and other diseases of the circulatory system: Secondary | ICD-10-CM

## 2020-06-09 DIAGNOSIS — Z79899 Other long term (current) drug therapy: Secondary | ICD-10-CM

## 2020-06-09 DIAGNOSIS — Q939 Deletion from autosomes, unspecified: Secondary | ICD-10-CM

## 2020-06-09 DIAGNOSIS — R509 Fever, unspecified: Secondary | ICD-10-CM | POA: Diagnosis present

## 2020-06-09 DIAGNOSIS — Y929 Unspecified place or not applicable: Secondary | ICD-10-CM

## 2020-06-09 HISTORY — DX: Disorder of kidney and ureter, unspecified: N28.9

## 2020-06-09 LAB — URINALYSIS, ROUTINE W REFLEX MICROSCOPIC
Bacteria, UA: NONE SEEN
Bilirubin Urine: NEGATIVE
Glucose, UA: NEGATIVE mg/dL
Ketones, ur: NEGATIVE mg/dL
Leukocytes,Ua: NEGATIVE
Nitrite: NEGATIVE
Protein, ur: NEGATIVE mg/dL
Specific Gravity, Urine: 1.01 (ref 1.005–1.030)
pH: 6 (ref 5.0–8.0)

## 2020-06-09 LAB — COMPREHENSIVE METABOLIC PANEL
ALT: 15 U/L (ref 0–44)
AST: 16 U/L (ref 15–41)
Albumin: 3 g/dL — ABNORMAL LOW (ref 3.5–5.0)
Alkaline Phosphatase: 39 U/L (ref 38–126)
Anion gap: 14 (ref 5–15)
BUN: 16 mg/dL (ref 6–20)
CO2: 25 mmol/L (ref 22–32)
Calcium: 8.8 mg/dL — ABNORMAL LOW (ref 8.9–10.3)
Chloride: 96 mmol/L — ABNORMAL LOW (ref 98–111)
Creatinine, Ser: 0.81 mg/dL (ref 0.44–1.00)
GFR, Estimated: >60 mL/min (ref >60)
Glucose, Bld: 112 mg/dL — ABNORMAL HIGH (ref 70–99)
Potassium: 3.5 mmol/L (ref 3.5–5.1)
Sodium: 135 mmol/L (ref 135–145)
Total Bilirubin: 1.1 mg/dL (ref 0.3–1.2)
Total Protein: 8.8 g/dL — ABNORMAL HIGH (ref 6.5–8.1)

## 2020-06-09 LAB — CBC WITH DIFFERENTIAL/PLATELET
Abs Immature Granulocytes: 0.38 10*3/uL — ABNORMAL HIGH (ref 0.00–0.07)
Basophils Absolute: 0 10*3/uL (ref 0.0–0.1)
Basophils Relative: 0 %
Eosinophils Absolute: 0.3 10*3/uL (ref 0.0–0.5)
Eosinophils Relative: 2 %
HCT: 31.1 % — ABNORMAL LOW (ref 36.0–46.0)
Hemoglobin: 9.9 g/dL — ABNORMAL LOW (ref 12.0–15.0)
Immature Granulocytes: 3 %
Lymphocytes Relative: 6 %
Lymphs Abs: 0.8 10*3/uL (ref 0.7–4.0)
MCH: 27.2 pg (ref 26.0–34.0)
MCHC: 31.8 g/dL (ref 30.0–36.0)
MCV: 85.4 fL (ref 80.0–100.0)
Monocytes Absolute: 0.9 10*3/uL (ref 0.1–1.0)
Monocytes Relative: 6 %
Neutro Abs: 11.7 10*3/uL — ABNORMAL HIGH (ref 1.7–7.7)
Neutrophils Relative %: 83 %
Platelets: 142 10*3/uL — ABNORMAL LOW (ref 150–400)
RBC: 3.64 MIL/uL — ABNORMAL LOW (ref 3.87–5.11)
RDW: 19.3 % — ABNORMAL HIGH (ref 11.5–15.5)
WBC: 14.1 10*3/uL — ABNORMAL HIGH (ref 4.0–10.5)
nRBC: 0.1 % (ref 0.0–0.2)

## 2020-06-09 LAB — I-STAT BETA HCG BLOOD, ED (MC, WL, AP ONLY): I-stat hCG, quantitative: <5 m[IU]/mL (ref <5)

## 2020-06-09 LAB — PROTIME-INR
INR: 1.4 — ABNORMAL HIGH (ref 0.8–1.2)
Prothrombin Time: 16.8 seconds — ABNORMAL HIGH (ref 11.4–15.2)

## 2020-06-09 LAB — C DIFFICILE QUICK SCREEN W PCR REFLEX
C Diff antigen: POSITIVE — AB
C Diff toxin: NEGATIVE

## 2020-06-09 LAB — RESP PANEL BY RT-PCR (FLU A&B, COVID) ARPGX2
Influenza A by PCR: NEGATIVE
Influenza B by PCR: NEGATIVE
SARS Coronavirus 2 by RT PCR: NEGATIVE

## 2020-06-09 LAB — LACTIC ACID, PLASMA: Lactic Acid, Venous: 1.2 mmol/L (ref 0.5–1.9)

## 2020-06-09 LAB — APTT: aPTT: 34 seconds (ref 24–36)

## 2020-06-09 LAB — MRSA PCR SCREENING: MRSA by PCR: NEGATIVE

## 2020-06-09 MED ORDER — ACETAMINOPHEN 325 MG PO TABS
650.0000 mg | ORAL_TABLET | Freq: Four times a day (QID) | ORAL | Status: DC | PRN
  Administered 2020-06-09 – 2020-06-12 (×7): 650 mg via ORAL
  Filled 2020-06-09 (×9): qty 2

## 2020-06-09 MED ORDER — ACYCLOVIR 400 MG PO TABS
400.0000 mg | ORAL_TABLET | Freq: Two times a day (BID) | ORAL | Status: DC
  Administered 2020-06-09 – 2020-06-14 (×10): 400 mg via ORAL
  Filled 2020-06-09 (×10): qty 1

## 2020-06-09 MED ORDER — HYDROCODONE-ACETAMINOPHEN 5-325 MG PO TABS: 1.0000 | ORAL_TABLET | ORAL | Status: DC | PRN

## 2020-06-09 MED ORDER — ONDANSETRON HCL 4 MG PO TABS: 4.0000 mg | ORAL_TABLET | Freq: Four times a day (QID) | ORAL | Status: DC | PRN

## 2020-06-09 MED ORDER — HYDROCHLOROTHIAZIDE 12.5 MG PO CAPS
12.5000 mg | ORAL_CAPSULE | Freq: Every day | ORAL | Status: DC
  Administered 2020-06-10: 12.5 mg via ORAL
  Filled 2020-06-09: qty 1

## 2020-06-09 MED ORDER — B COMPLEX VITAMINS PO CAPS: 1.0000 | ORAL_CAPSULE | Freq: Every morning | ORAL | Status: DC

## 2020-06-09 MED ORDER — SODIUM CHLORIDE 0.9 % IV SOLN
2.0000 g | Freq: Once | INTRAVENOUS | Status: AC
  Administered 2020-06-09: 2 g via INTRAVENOUS
  Filled 2020-06-09: qty 2

## 2020-06-09 MED ORDER — HYDRALAZINE HCL 20 MG/ML IJ SOLN: 10.0000 mg | Freq: Three times a day (TID) | INTRAMUSCULAR | Status: DC | PRN

## 2020-06-09 MED ORDER — B COMPLEX-C PO TABS
1.0000 | ORAL_TABLET | Freq: Every day | ORAL | Status: DC
  Administered 2020-06-10 – 2020-06-14 (×5): 1 via ORAL
  Filled 2020-06-09 (×5): qty 1

## 2020-06-09 MED ORDER — LISINOPRIL-HYDROCHLOROTHIAZIDE 10-12.5 MG PO TABS: 1.0000 | ORAL_TABLET | Freq: Every day | ORAL | Status: DC

## 2020-06-09 MED ORDER — APIXABAN 2.5 MG PO TABS
2.5000 mg | ORAL_TABLET | Freq: Two times a day (BID) | ORAL | Status: DC
  Administered 2020-06-09 – 2020-06-14 (×10): 2.5 mg via ORAL
  Filled 2020-06-09 (×10): qty 1

## 2020-06-09 MED ORDER — SODIUM CHLORIDE 0.9 % IV BOLUS (SEPSIS)
1000.0000 mL | Freq: Once | INTRAVENOUS | Status: AC
  Administered 2020-06-09: 1000 mL via INTRAVENOUS

## 2020-06-09 MED ORDER — ONDANSETRON HCL 4 MG/2ML IJ SOLN: 4.0000 mg | Freq: Four times a day (QID) | INTRAMUSCULAR | Status: DC | PRN

## 2020-06-09 MED ORDER — ASCORBIC ACID 500 MG PO TABS
1000.0000 mg | ORAL_TABLET | Freq: Every morning | ORAL | Status: DC
  Administered 2020-06-10 – 2020-06-14 (×5): 1000 mg via ORAL
  Filled 2020-06-09 (×5): qty 2

## 2020-06-09 MED ORDER — SODIUM CHLORIDE 0.9 % IV SOLN
2.0000 g | Freq: Three times a day (TID) | INTRAVENOUS | Status: DC
  Administered 2020-06-09 – 2020-06-12 (×9): 2 g via INTRAVENOUS
  Filled 2020-06-09 (×11): qty 2

## 2020-06-09 MED ORDER — LISINOPRIL 10 MG PO TABS
10.0000 mg | ORAL_TABLET | Freq: Every day | ORAL | Status: DC
  Administered 2020-06-10 – 2020-06-14 (×5): 10 mg via ORAL
  Filled 2020-06-09 (×5): qty 1

## 2020-06-09 MED ORDER — ACETAMINOPHEN 650 MG RE SUPP: 650.0000 mg | Freq: Four times a day (QID) | RECTAL | Status: DC | PRN

## 2020-06-09 MED ORDER — LENALIDOMIDE 15 MG PO CAPS: 15.0000 mg | ORAL_CAPSULE | Freq: Every day | ORAL | Status: DC

## 2020-06-09 MED ORDER — VITAMIN B-12 1000 MCG PO TABS
3000.0000 ug | ORAL_TABLET | Freq: Every day | ORAL | Status: DC
  Administered 2020-06-10 – 2020-06-14 (×5): 3000 ug via ORAL
  Filled 2020-06-09 (×5): qty 3

## 2020-06-09 MED ORDER — LORAZEPAM 0.5 MG PO TABS: 0.5000 mg | ORAL_TABLET | Freq: Four times a day (QID) | ORAL | Status: DC | PRN

## 2020-06-09 MED ORDER — VITAMIN D 25 MCG (1000 UNIT) PO TABS
5000.0000 [IU] | ORAL_TABLET | Freq: Every morning | ORAL | Status: DC
  Administered 2020-06-10 – 2020-06-14 (×5): 5000 [IU] via ORAL
  Filled 2020-06-09 (×5): qty 5

## 2020-06-09 MED ORDER — SODIUM CHLORIDE 0.9 % IV SOLN: INTRAVENOUS | Status: DC

## 2020-06-09 MED ORDER — VITAMIN B12 3000 MCG SL SUBL: 3000.0000 ug | SUBLINGUAL_TABLET | Freq: Every morning | SUBLINGUAL | Status: DC

## 2020-06-09 MED ORDER — LORATADINE 10 MG PO TABS
10.0000 mg | ORAL_TABLET | Freq: Every morning | ORAL | Status: DC
  Administered 2020-06-10 – 2020-06-14 (×5): 10 mg via ORAL
  Filled 2020-06-09 (×5): qty 1

## 2020-06-09 MED ORDER — VANCOMYCIN HCL IN DEXTROSE 1-5 GM/200ML-% IV SOLN
1000.0000 mg | Freq: Once | INTRAVENOUS | Status: AC
  Administered 2020-06-09: 1000 mg via INTRAVENOUS
  Filled 2020-06-09: qty 200

## 2020-06-09 NOTE — H&P (Addendum)
History and Physical    Megan Velasquez ZSW:[id number] DOB: 02/14/1961 DOA: 06/09/2020  PCP: [name], MD  Patient coming from: Home  Chief Complaint: fever, body aches  HPI: Megan Velasquez is a 60 y.o. female with medical history significant of MM, GERD, HTN. Presenting with fevers, body aches, and fatigue. She is followed by Dr. Irene Limbo for multiple myeloma. She reports that she had a port-a-cath placed 4 days ago and she started chemo 3 days ago for her MM. She had 2 sessions. She reports that yesterday around noon she felt feverish w/ body aches and increased fatigue. Her temps ranged from 99.5 to 102 and were unresponsive to APAP. She tried resting. When she woke this morning with the same symptoms, she decided to come to the ED. She denies any other aggravating or alleviating factors.    ED Course: CXR was negative. UA was unremarkable. She was found to be tachycardic and had an elevated WBC. She was started on vanc and cefepime. TRH was called for admission.   Review of Systems:  Denies CP, palpitations, N/V, syncopal episodes, sick contacts. Reports dyspnea, diahrrea. Review of systems is otherwise negative for all not mentioned in HPI.   PMHx Past Medical History:  Diagnosis Date  . Cancer ([place])   . GERD (gastroesophageal reflux disease)   . History of kidney stones   . HTN (hypertension)   . Renal disorder     PSHx Past Surgical History:  Procedure Laterality Date  . CHOLECYSTECTOMY    . COLONOSCOPY  01/2015   Dr. Britta Mccreedy: Mild diverticulosis, sessile polyp ranging 3 to 5 mm removed from the proximal transverse colon, semi-pedunculated polyp 5 to 9 mm in size removed from the sigmoid colon.  Sigmoid colon polyp was serrated adenoma, transverse colon polyp was adenomatous.  Patient was told to have another colonoscopy in 3 years.  . COLONOSCOPY WITH PROPOFOL N/A 10/11/2017   Procedure: COLONOSCOPY WITH PROPOFOL;  Surgeon: Daneil Dolin, MD;  [facility];  Service:  Endoscopy;  Laterality: N/A;  1:15pm  . IR IMAGING GUIDED PORT INSERTION  06/05/2020  . POLYPECTOMY  10/11/2017   Procedure: POLYPECTOMY;  Surgeon: Daneil Dolin, MD;  Location: [facility];  Service: Endoscopy;;  descending colon polyps cs times 2    SocHx  reports that she quit smoking about 6 years ago. Her smoking use included cigarettes. She has a 28.00 pack-year smoking history. She has never used smokeless tobacco. She reports previous alcohol use. She reports that she does not use drugs.  No Known Allergies  FamHx Family History  Problem Relation Age of Onset  . Heart disease Mother   . Diabetes Mother   . Lung cancer Father   . COPD Father   . Colon cancer Neg Hx     Prior to Admission medications   Medication Sig Start Date End Date Taking? Authorizing Provider  acetaminophen (TYLENOL) 500 MG tablet Take 1,000 mg by mouth daily as needed for headache.    [provider]  acyclovir (ZOVIRAX) 400 MG tablet Take 1 tablet (400 mg total) by mouth 2 (two) times daily. 05/30/20   Brunetta Genera, MD  apixaban (ELIQUIS) 2.5 MG TABS tablet Take 1 tablet (2.5 mg total) by mouth 2 (two) times daily. 06/06/20   Brunetta Genera, MD  b complex vitamins capsule Take 1 capsule by mouth daily. 04/25/20   Brunetta Genera, MD  B Complex-C (SUPER B COMPLEX PO) Take 1 capsule by mouth  daily at 2 PM.    [provider]  Carboxymethylcellul-Glycerin (LUBRICATING EYE DROPS OP) Place 1 drop into the right eye 4 (four) times daily. Use for 3 days prior to surgery    [provider]  Cholecalciferol (VITAMIN D3 PO) Take by mouth. 02/20/17   [provider]  Cholecalciferol (VITAMIN D3) 5000 units CAPS Take 5,000 Units by mouth daily.    [provider]  Cyanocobalamin (VITAMIN B12) 3000 MCG/ML LIQD Place 3,000 mcg under the tongue daily. 04/25/20   Brunetta Genera, MD  dexamethasone (DECADRON) 4 MG tablet Take 5 tablets (20 mg total) by mouth  once a week. 04/25/20   Brunetta Genera, MD  dexamethasone (DECADRON) 4 MG tablet Take 10 tablets (61m) once on day 22. Repeat every 28 days for the first 4 cycles. Take with breakfast. 05/30/20   KBrunetta Genera MD  lenalidomide (REVLIMID) 15 MG capsule Take 1 capsule (15 mg total) by mouth daily. Take for 21 days. Hold 7 days. Repeat every 28 days. [employer] aJosem Kaufmann##[id number] Date obtained 05/29/20 05/31/20   KBrunetta Genera MD  lidocaine-prilocaine (EMLA) cream Apply to affected area once 05/30/20   KBrunetta Genera MD  lisinopril-hydrochlorothiazide (PRINZIDE,ZESTORETIC) 10-12.5 MG tablet Take 1 tablet by mouth daily.    [provider]  loratadine (CLARITIN) 10 MG tablet Take 10 mg by mouth daily as needed for allergies.    [provider]  Loratadine 10 MG CAPS  10 mg =, daily, 0 Refill(s) 06/23/17   [provider]  LORazepam (ATIVAN) 0.5 MG tablet Take 1 tablet (0.5 mg total) by mouth every 6 (six) hours as needed (Nausea or vomiting). 05/30/20   KBrunetta Genera MD  omeprazole (PRILOSEC) 40 MG capsule Take by mouth. 01/31/20   [provider]  ondansetron (ZOFRAN) 8 MG tablet Take 1 tablet (8 mg total) by mouth 2 (two) times daily as needed (Nausea or vomiting). 05/30/20   KBrunetta Genera MD  prochlorperazine (COMPAZINE) 10 MG tablet Take 1 tablet (10 mg total) by mouth every 6 (six) hours as needed (Nausea or vomiting). 05/30/20   KBrunetta Genera MD  vitamin C (ASCORBIC ACID) 500 MG tablet Take 500 mg by mouth daily.    [provider]    Physical Exam: Vitals:   06/09/20 1300 06/09/20 1315 06/09/20 1337 06/09/20 1403  BP:  130/61 (!) 160/75   Pulse: 97 97 (!) 108 99  Resp: 17 13 16 20   Temp:      TempSrc:      SpO2: 98% 99% 97% 99%  Weight:      Height:        General: 60y.o. female resting in bed in NAD Eyes: PERRL, normal sclera ENMT: Nares patent w/o discharge, orophaynx clear, dentition normal, ears  w/o discharge/lesions/ulcers Neck: Supple, trachea midline Cardiovascular: tachy, +S1, S2, no m/g/r, equal pulses throughout, right chest port noted that is not erythematous or purulent  Respiratory: CTABL, no w/r/r, normal WOB GI: BS+, NDNT, no masses noted, no organomegaly noted MSK: No e/c/c Skin: No rashes, bruises, ulcerations noted Neuro: A&O x 3, no focal deficits Psyc: Appropriate interaction and affect, calm/cooperative  Labs on Admission: I have personally reviewed following labs and imaging studies  CBC: Recent Labs  Lab 06/05/20 1300 06/06/20 0945 06/09/20 1208  WBC 6.9 7.5 14.1*  NEUTROABS 4.0 4.7 11.7*  HGB 10.3* 9.1* 9.9*  HCT 33.7* 28.9* 31.1*  MCV 88.0 85.0 85.4  PLT 164 163  403*   Basic Metabolic Panel: Recent Labs  Lab 06/06/20 0945 06/09/20 1208  NA 137 135  K 3.8 3.5  CL 103 96*  CO2 24 25  GLUCOSE 126* 112*  BUN 16 16  CREATININE 0.98 0.81  CALCIUM 8.3* 8.8*   GFR: Estimated Creatinine Clearance: 66.5 mL/min (by C-G formula based on SCr of 0.81 mg/dL). Liver Function Tests: Recent Labs  Lab 06/06/20 0945 06/09/20 1208  AST 12* 16  ALT 9 15  ALKPHOS 54 39  BILITOT 0.3 1.1  PROT 9.4* 8.8*  ALBUMIN 2.8* 3.0*   No results for input(s): LIPASE, AMYLASE in the last 168 hours. No results for input(s): AMMONIA in the last 168 hours. Coagulation Profile: Recent Labs  Lab 06/09/20 1208  INR 1.4*   Cardiac Enzymes: No results for input(s): CKTOTAL, CKMB, CKMBINDEX, TROPONINI in the last 168 hours. BNP (last 3 results) No results for input(s): PROBNP in the last 8760 hours. HbA1C: No results for input(s): HGBA1C in the last 72 hours. CBG: No results for input(s): GLUCAP in the last 168 hours. Lipid Profile: No results for input(s): CHOL, HDL, LDLCALC, TRIG, CHOLHDL, LDLDIRECT in the last 72 hours. Thyroid Function Tests: No results for input(s): TSH, T4TOTAL, FREET4, T3FREE, THYROIDAB in the last 72 hours. Anemia Panel: No results  for input(s): VITAMINB12, FOLATE, FERRITIN, TIBC, IRON, RETICCTPCT in the last 72 hours. Urine analysis:    Component Value Date/Time   COLORURINE YELLOW 06/09/2020 1208   APPEARANCEUR HAZY (A) 06/09/2020 1208   LABSPEC 1.010 06/09/2020 1208   PHURINE 6.0 06/09/2020 1208   GLUCOSEU NEGATIVE 06/09/2020 1208   HGBUR SMALL (A) 06/09/2020 1208   BILIRUBINUR NEGATIVE 06/09/2020 1208   KETONESUR NEGATIVE 06/09/2020 1208   PROTEINUR NEGATIVE 06/09/2020 1208   NITRITE NEGATIVE 06/09/2020 [place] 06/09/2020 1208    Radiological Exams on Admission: DG Chest Port 1 View  Result Date: 06/09/2020 CLINICAL DATA:  Fever and shortness of breath. EXAM: PORTABLE CHEST 1 VIEW COMPARISON:  None. FINDINGS: The cardiomediastinal silhouette is unremarkable. A RIGHT Port-A-Cath is noted with tip overlying the LOWER SVC. There is no evidence of focal airspace disease, pulmonary edema, suspicious pulmonary nodule/mass, pleural effusion, or pneumothorax. No acute bony abnormalities are identified. IMPRESSION: No active disease. Electronically Signed   By: Margarette Canada M.D.   On: 06/09/2020 13:03    EKG: Independently reviewed. Sinus tachy, no ST elevations  Assessment/Plan Dehydration Fever of unknown origin Chronic fatigue Diarrhea     - place in obs, med-surg     - she was started on sepsis protocol w/ vanc/cefepime; she shows SIRS only right now; question if this is moreso d/t the chemo treatment as opposed to infection and that isn't entirely clear right now; no fevers here, hold abx until she declares (see below about her chemotherapy) UPDATE: fever noted once she got the the floor (103); continue abx, also question maybe drug fever?     - IVF, PRN APAP     - follow Bld Cx, UCx     - CXR is negative     - no abdominal pain on exam, can hold on abdominal imaging for right now     - check c diff, GI PCR     - she is not hypoxic on RA; although she is tachycardic; she is on eliquis, so  low suspicion for PTE; however, if she does not improve w/ fluid resuscitation, consider CTA PE   Multiple Myeloma on chemo     -  on induction therapy w/ carfilzomib/Revlimid/dexamethasone     - continue ASA and eliquis d/t revlimid     - continue acyclovir for shingles prophylaxis     - spoke with Dr. Alen Blew about her chemo regimen; these medicines should not be immediately immunosuppressive; her regimen has been held d/t concern for infection per protocol  HTN     - resume home regimen  GERD     - PPI  DVT prophylaxis: eliquis  Code Status: FULL  Family Communication: W/ husband at bedside  Consults called: Onco Status is: Observation  The patient remains OBS appropriate and will d/c before 2 midnights.  Dispo: The patient is from: Home              Anticipated d/c is to: Home              Patient currently is not medically stable to d/c.   Difficult to place patient No  Jonnie Finner DO [facility]  If 7PM-7AM, please contact night-coverage [email or web address]  06/09/2020, 2:06 PM

## 2020-06-09 NOTE — ED Provider Notes (Addendum)
[facility] DEPT Provider Note   CSN: [id number] Arrival date & time: 06/09/20  1150     History Chief Complaint  Patient presents with  . chemo card  . Generalized Body Aches  . Fever  . Shortness of Breath    Megan Velasquez is a 60 y.o. female.   Fever Max temp prior to arrival:  101 Temp source:  Oral Severity:  Moderate Onset quality:  Sudden Progression:  Resolved Chronicity:  New Relieved by:  Nothing Worsened by:  Nothing Associated symptoms: chills, diarrhea (one episode) and myalgias   Associated symptoms: no chest pain, no confusion, no congestion, no cough, no dysuria, no ear pain, no rash, no sore throat and no vomiting   Risk factors: immunosuppression (started chemo (oncologist [facility]) 2 days ago. Port placed last week.)   Shortness of Breath Associated symptoms: fever   Associated symptoms: no abdominal pain, no chest pain, no cough, no ear pain, no rash, no sore throat and no vomiting        Past Medical History:  Diagnosis Date  . Cancer ([place])   . GERD (gastroesophageal reflux disease)   . History of kidney stones   . HTN (hypertension)   . Renal disorder     Patient Active Problem List   Diagnosis Date Noted  . Port-A-Cath in place 06/06/2020  . Multiple myeloma not having achieved remission ([place]) 05/30/2020  . Counseling regarding advance care planning and goals of care 05/30/2020  . Hx of adenomatous colonic polyps 08/27/2017  . Hemorrhoid 08/27/2017    Past Surgical History:  Procedure Laterality Date  . CHOLECYSTECTOMY    . COLONOSCOPY  01/2015   Dr. Britta Mccreedy: Mild diverticulosis, sessile polyp ranging 3 to 5 mm removed from the proximal transverse colon, semi-pedunculated polyp 5 to 9 mm in size removed from the sigmoid colon.  Sigmoid colon polyp was serrated adenoma, transverse colon polyp was adenomatous.  Patient was told to have another colonoscopy in 3 years.  . COLONOSCOPY WITH PROPOFOL N/A 10/11/2017    Procedure: COLONOSCOPY WITH PROPOFOL;  Surgeon: Daneil Dolin, MD;  [facility];  Service: Endoscopy;  Laterality: N/A;  1:15pm  . IR IMAGING GUIDED PORT INSERTION  06/05/2020  . POLYPECTOMY  10/11/2017   Procedure: POLYPECTOMY;  Surgeon: Daneil Dolin, MD;  Location[facility];  Service: Endoscopy;;  descending colon polyps cs times 2     OB History   No obstetric history on file.     Family History  Problem Relation Age of Onset  . Heart disease Mother   . Diabetes Mother   . Lung cancer Father   . COPD Father   . Colon cancer Neg Hx     Social History   Tobacco Use  . Smoking status: Former Smoker    Packs/day: 1.00    Years: 28.00    Pack years: 28.00    Types: Cigarettes    Quit date: 10/08/2013    Years since quitting: 6.6  . Smokeless tobacco: Never Used  Vaping Use  . Vaping Use: Never used  Substance Use Topics  . Alcohol use: Not Currently  . Drug use: Never    Home Medications Prior to Admission medications   Medication Sig Start Date End Date Taking? Authorizing Provider  acetaminophen (TYLENOL) 500 MG tablet Take 1,000 mg by mouth daily as needed for headache.    [provider]  acyclovir (ZOVIRAX) 400 MG tablet Take 1 tablet (400 mg total)  by mouth 2 (two) times daily. 05/30/20   Brunetta Genera, MD  apixaban (ELIQUIS) 2.5 MG TABS tablet Take 1 tablet (2.5 mg total) by mouth 2 (two) times daily. 06/06/20   Brunetta Genera, MD  b complex vitamins capsule Take 1 capsule by mouth daily. 04/25/20   Brunetta Genera, MD  B Complex-C (SUPER B COMPLEX PO) Take 1 capsule by mouth daily at 2 PM.    [provider]  Carboxymethylcellul-Glycerin (LUBRICATING EYE DROPS OP) Place 1 drop into the right eye 4 (four) times daily. Use for 3 days prior to surgery    [provider]  Cholecalciferol (VITAMIN D3 PO) Take by mouth. 02/20/17   [provider]  Cholecalciferol (VITAMIN D3) 5000 units CAPS Take 5,000  Units by mouth daily.    [provider]  Cyanocobalamin (VITAMIN B12) 3000 MCG/ML LIQD Place 3,000 mcg under the tongue daily. 04/25/20   Brunetta Genera, MD  dexamethasone (DECADRON) 4 MG tablet Take 5 tablets (20 mg total) by mouth once a week. 04/25/20   Brunetta Genera, MD  dexamethasone (DECADRON) 4 MG tablet Take 10 tablets (8m) once on day 22. Repeat every 28 days for the first 4 cycles. Take with breakfast. 05/30/20   KBrunetta Genera MD  lenalidomide (REVLIMID) 15 MG capsule Take 1 capsule (15 mg total) by mouth daily. Take for 21 days. Hold 7 days. Repeat every 28 days. [employer] aJosem Kaufmann##[id number] Date obtained 05/29/20 05/31/20   KBrunetta Genera MD  lidocaine-prilocaine (EMLA) cream Apply to affected area once 05/30/20   KBrunetta Genera MD  lisinopril-hydrochlorothiazide (PRINZIDE,ZESTORETIC) 10-12.5 MG tablet Take 1 tablet by mouth daily.    [provider]  loratadine (CLARITIN) 10 MG tablet Take 10 mg by mouth daily as needed for allergies.    [provider]  Loratadine 10 MG CAPS  10 mg =, daily, 0 Refill(s) 06/23/17   [provider]  LORazepam (ATIVAN) 0.5 MG tablet Take 1 tablet (0.5 mg total) by mouth every 6 (six) hours as needed (Nausea or vomiting). 05/30/20   KBrunetta Genera MD  omeprazole (PRILOSEC) 40 MG capsule Take by mouth. 01/31/20   [provider]  ondansetron (ZOFRAN) 8 MG tablet Take 1 tablet (8 mg total) by mouth 2 (two) times daily as needed (Nausea or vomiting). 05/30/20   KBrunetta Genera MD  prochlorperazine (COMPAZINE) 10 MG tablet Take 1 tablet (10 mg total) by mouth every 6 (six) hours as needed (Nausea or vomiting). 05/30/20   KBrunetta Genera MD  vitamin C (ASCORBIC ACID) 500 MG tablet Take 500 mg by mouth daily.    [provider]    Allergies    Patient has no known allergies.  Review of Systems   Review of Systems  Constitutional: Positive for chills and fever.   HENT: Negative for congestion, ear pain and sore throat.   Eyes: Negative for pain and visual disturbance.  Respiratory: Negative for cough and shortness of breath.   Cardiovascular: Negative for chest pain and palpitations.  Gastrointestinal: Positive for diarrhea (one episode). Negative for abdominal pain and vomiting.  Genitourinary: Negative for dysuria and hematuria.  Musculoskeletal: Positive for myalgias. Negative for arthralgias and back pain.  Skin: Negative for color change and rash.  Neurological: Negative for seizures and syncope.  Psychiatric/Behavioral: Negative for confusion.  All other systems reviewed and are negative.   Physical Exam Updated Vital Signs  ED Triage Vitals  Enc Vitals Group  BP 06/09/20 1203 (!) 160/73     Pulse Rate 06/09/20 1203 (!) 109     Resp 06/09/20 1203 18     Temp 06/09/20 1203 98.7 F (37.1 C)     Temp Source 06/09/20 1203 Oral     SpO2 06/09/20 1203 100 %     Weight 06/09/20 1205 139 lb 12.8 oz (63.4 kg)     Height 06/09/20 1205 _0  (1.651 m)     Head Circumference --      Peak Flow --      Pain Score 06/09/20 1204 6     Pain Loc --      Pain Edu? --      Excl. in [place]? --     Physical Exam Vitals and nursing note reviewed.  Constitutional:      General: She is not in acute distress.    Appearance: She is well-developed and well-nourished. She is not ill-appearing.  HENT:     Head: Normocephalic and atraumatic.     Mouth/Throat:     Mouth: Mucous membranes are moist.  Eyes:     Conjunctiva/sclera: Conjunctivae normal.     Pupils: Pupils are equal, round, and reactive to light.  Cardiovascular:     Rate and Rhythm: Normal rate and regular rhythm.     Pulses: Normal pulses. No decreased pulses.     Heart sounds: Normal heart sounds. No murmur heard.   Pulmonary:     Effort: Pulmonary effort is normal. No respiratory distress.     Breath sounds: Normal breath sounds. No decreased breath sounds, wheezing or rhonchi.   Abdominal:     Palpations: Abdomen is soft.     Tenderness: There is no abdominal tenderness.  Musculoskeletal:        General: No edema. Normal range of motion.     Cervical back: Normal range of motion and neck supple.     Right lower leg: No edema.     Left lower leg: No edema.  Skin:    General: Skin is warm and dry.  Neurological:     General: No focal deficit present.     Mental Status: She is alert.  Psychiatric:        Mood and Affect: Mood and affect normal.     ED Results / Procedures / Treatments   Labs (all labs ordered are listed, but only abnormal results are displayed) Labs Reviewed  COMPREHENSIVE METABOLIC PANEL - Abnormal; Notable for the following components:      Result Value   Chloride 96 (*)    Glucose, Bld 112 (*)    Calcium 8.8 (*)    Total Protein 8.8 (*)    Albumin 3.0 (*)    All other components within normal limits  CBC WITH DIFFERENTIAL/PLATELET - Abnormal; Notable for the following components:   WBC 14.1 (*)    RBC 3.64 (*)    Hemoglobin 9.9 (*)    HCT 31.1 (*)    RDW 19.3 (*)    Platelets 142 (*)    Neutro Abs 11.7 (*)    Abs Immature Granulocytes 0.38 (*)    All other components within normal limits  PROTIME-INR - Abnormal; Notable for the following components:   Prothrombin Time 16.8 (*)    INR 1.4 (*)    All other components within normal limits  URINALYSIS, ROUTINE W REFLEX MICROSCOPIC - Abnormal; Notable for the following components:   APPearance HAZY (*)    Hgb urine dipstick SMALL (*)  All other components within normal limits  RESP PANEL BY RT-PCR (FLU A&B, COVID) [id number]  CULTURE, BLOOD (ROUTINE X 2)  CULTURE, BLOOD (ROUTINE X 2)  URINE CULTURE  LACTIC ACID, PLASMA  APTT  LACTIC ACID, PLASMA  I-STAT BETA HCG BLOOD, ED ([facility], [facility], [facility] ONLY)  I-STAT BETA HCG BLOOD, ED ([facility], [facility], [facility] ONLY)    EKG EKG Interpretation  Date/Time:  Sunday June 09 2020 12:27:58 EST Ventricular Rate:  100 PR Interval:    QRS Duration: 85 QT  Interval:  360 QTC Calculation: 465 R Axis:   62 Text Interpretation: Sinus tachycardia Biatrial enlargement Probable anteroseptal infarct, old Nonspecific T abnormalities, lateral leads Confirmed by Lennice Sites [phone]) on 06/09/2020 12:39:18 PM   Radiology DG Chest Port 1 View  Result Date: 06/09/2020 CLINICAL DATA:  Fever and shortness of breath. EXAM: PORTABLE CHEST 1 VIEW COMPARISON:  None. FINDINGS: The cardiomediastinal silhouette is unremarkable. A RIGHT Port-A-Cath is noted with tip overlying the LOWER SVC. There is no evidence of focal airspace disease, pulmonary edema, suspicious pulmonary nodule/mass, pleural effusion, or pneumothorax. No acute bony abnormalities are identified. IMPRESSION: No active disease. Electronically Signed   By: Margarette Canada M.D.   On: 06/09/2020 13:03    Procedures .Critical Care Performed by: Lennice Sites, DO Authorized by: Lennice Sites, DO   Critical care provider statement:    Critical care time (minutes):  35   Critical care was necessary to treat or prevent imminent or life-threatening deterioration of the following conditions: fever of unknown origin, IV antibiotics given concern for occult bacteremia.   Critical care was time spent personally by me on the following activities:  Blood draw for specimens, development of treatment plan with patient or surrogate, discussions with primary provider, evaluation of patient's response to treatment, examination of patient, obtaining history from patient or surrogate, ordering and performing treatments and interventions, ordering and review of laboratory studies, ordering and review of radiographic studies, pulse oximetry, re-evaluation of patient's condition and review of old charts   I assumed direction of critical care for this patient from another provider in my specialty: no       Medications Ordered in ED Medications  vancomycin (VANCOCIN) IVPB 1000 mg/200 mL premix (1,000 mg Intravenous New  Bag/Given 06/09/20 1338)  sodium chloride 0.9 % bolus 1,000 mL (0 mLs Intravenous Stopped 06/09/20 1402)  ceFEPIme (MAXIPIME) 2 g in sodium chloride 0.9 % 100 mL IVPB (0 g Intravenous Stopped 06/09/20 1328)    ED Course  I have reviewed the triage vital signs and the nursing notes.  Pertinent labs & imaging results that were available during my care of the patient were reviewed by me and considered in my medical decision making (see chart for details).    MDM Rules/Calculators/A&P                          Megan Velasquez is a 60 year old female with history of hypertension, reflux, multiple myeloma who presents the ED with fever, chills.  Patient with mild tachycardia but otherwise normal vitals.  Had a fever greater than 101 last night.  Started chemotherapy for multiple myeloma several days ago.  Just had her port placed last week as well.  First rounds of chemotherapy were Thursday and Friday.  Had fever overnight.  Had 1 episode of diarrhea this morning but otherwise just has body aches and chills.  Concern for neutropenic fever.  Does not appear to have any abdominal pain  and no urinary symptoms.  No cough or sputum production.  Not vaccinated against COVID.  Sepsis work-up initiated and broad-spectrum IV antibiotics given as concern for neutropenic fever.  Anticipate admission given recent chemotherapy and recent port placement.  High risk for occult bacteremia.  Patient with leukocytosis of 14.  No significant anemia.  No significant electrolyte abnormality.  Urinalysis negative for infection.  Chest x-ray negative for infection.  Does not have any abdominal pain and have low suspicion for an intra-abdominal source for infection.  Given that she has had tachycardia, leukocytosis and fever at home concern for occult bacteremia given her recent port placement and recent chemotherapy/immunocompromise state.  Will admit for further care.  This chart was dictated using voice recognition software.  Despite  best efforts to proofread,  errors can occur which can change the documentation meaning.    Final Clinical Impression(s) / ED Diagnoses Final diagnoses:  Fever, unknown origin    Rx / DC Orders ED Discharge Orders    None       Lennice Sites, DO 06/09/20 [place], [place], DO 06/09/20 1404

## 2020-06-09 NOTE — Progress Notes (Signed)
   06/09/20 1608  Treat  MEWS Interventions Administered prn meds/treatments  Complains of Fever  Interventions Medication (see MAR)  Take Vital Signs  Increase Vital Sign Frequency  Yellow: Q 2hr X 2 then Q 4hr X 2, if remains yellow, continue Q 4hrs  Escalate  MEWS: Escalate Yellow: discuss with charge nurse/RN and consider discussing with provider and RRT  Notify: Charge Nurse/RN  Name of Charge Nurse/RN Notified Self, Mardi Mainland also  Date Charge Nurse/RN Notified 06/09/20  Time Charge Nurse/RN Notified 1550  Notify: Provider  Provider Name/Title Dr Marylyn Ishihara  Date Provider Notified 06/09/20  Time Provider Notified 1610  Notification Type Page  Notification Reason Change in status  Provider response No new orders  Date of Provider Response 06/09/20  Time of Provider Response 2178  Document  Patient Outcome Stabilized after interventions  Progress note created (see row info) Yes  3754 WLTK 23.2

## 2020-06-09 NOTE — Sepsis Progress Note (Signed)
[facility] is following this sepsis 

## 2020-06-09 NOTE — ED Triage Notes (Signed)
Patient c/o body aches, SOB, and fever of > 101 last night. Patient states this AM the SOB has improved. Temp in triage-98.7 orally.  Patient states she had chemo 2 days ago.

## 2020-06-09 NOTE — ED Notes (Signed)
Hospitalist at bedside 

## 2020-06-09 NOTE — Progress Notes (Signed)
A consult was received from an ED physician for vancomycin and cefepime per pharmacy dosing (for an indication other than meningitis). The patient's profile has been reviewed for ht/wt/allergies/indication/available labs. A one time order has been placed for the above antibiotics.  Further antibiotics/pharmacy consults should be ordered by admitting physician if indicated.                       Reuel Boom, PharmD, BCPS [phone] 06/09/2020, 12:20 PM

## 2020-06-10 DIAGNOSIS — E876 Hypokalemia: Secondary | ICD-10-CM | POA: Diagnosis present

## 2020-06-10 DIAGNOSIS — Z20822 Contact with and (suspected) exposure to covid-19: Secondary | ICD-10-CM | POA: Diagnosis present

## 2020-06-10 DIAGNOSIS — I10 Essential (primary) hypertension: Secondary | ICD-10-CM | POA: Diagnosis present

## 2020-06-10 DIAGNOSIS — Z8249 Family history of ischemic heart disease and other diseases of the circulatory system: Secondary | ICD-10-CM | POA: Diagnosis not present

## 2020-06-10 DIAGNOSIS — K219 Gastro-esophageal reflux disease without esophagitis: Secondary | ICD-10-CM | POA: Diagnosis present

## 2020-06-10 DIAGNOSIS — Q939 Deletion from autosomes, unspecified: Secondary | ICD-10-CM | POA: Diagnosis not present

## 2020-06-10 DIAGNOSIS — R509 Fever, unspecified: Secondary | ICD-10-CM | POA: Diagnosis present

## 2020-06-10 DIAGNOSIS — D649 Anemia, unspecified: Secondary | ICD-10-CM | POA: Diagnosis present

## 2020-06-10 DIAGNOSIS — F419 Anxiety disorder, unspecified: Secondary | ICD-10-CM | POA: Diagnosis present

## 2020-06-10 DIAGNOSIS — Z801 Family history of malignant neoplasm of trachea, bronchus and lung: Secondary | ICD-10-CM | POA: Diagnosis not present

## 2020-06-10 DIAGNOSIS — T451X5A Adverse effect of antineoplastic and immunosuppressive drugs, initial encounter: Secondary | ICD-10-CM | POA: Diagnosis present

## 2020-06-10 DIAGNOSIS — D72829 Elevated white blood cell count, unspecified: Secondary | ICD-10-CM | POA: Diagnosis not present

## 2020-06-10 DIAGNOSIS — Y929 Unspecified place or not applicable: Secondary | ICD-10-CM | POA: Diagnosis not present

## 2020-06-10 DIAGNOSIS — D638 Anemia in other chronic diseases classified elsewhere: Secondary | ICD-10-CM | POA: Diagnosis present

## 2020-06-10 DIAGNOSIS — Z833 Family history of diabetes mellitus: Secondary | ICD-10-CM | POA: Diagnosis not present

## 2020-06-10 DIAGNOSIS — Z825 Family history of asthma and other chronic lower respiratory diseases: Secondary | ICD-10-CM | POA: Diagnosis not present

## 2020-06-10 DIAGNOSIS — Z79899 Other long term (current) drug therapy: Secondary | ICD-10-CM | POA: Diagnosis not present

## 2020-06-10 DIAGNOSIS — Z7901 Long term (current) use of anticoagulants: Secondary | ICD-10-CM | POA: Diagnosis not present

## 2020-06-10 DIAGNOSIS — C9000 Multiple myeloma not having achieved remission: Secondary | ICD-10-CM | POA: Diagnosis present

## 2020-06-10 DIAGNOSIS — Z87891 Personal history of nicotine dependence: Secondary | ICD-10-CM | POA: Diagnosis not present

## 2020-06-10 DIAGNOSIS — E860 Dehydration: Secondary | ICD-10-CM | POA: Diagnosis present

## 2020-06-10 DIAGNOSIS — Z87442 Personal history of urinary calculi: Secondary | ICD-10-CM | POA: Diagnosis not present

## 2020-06-10 DIAGNOSIS — D6959 Other secondary thrombocytopenia: Secondary | ICD-10-CM | POA: Diagnosis present

## 2020-06-10 LAB — GASTROINTESTINAL PANEL BY PCR, STOOL (REPLACES STOOL CULTURE)

## 2020-06-10 LAB — CBC WITH DIFFERENTIAL/PLATELET
Abs Immature Granulocytes: 0.95 10*3/uL — ABNORMAL HIGH (ref 0.00–0.07)
Basophils Absolute: 0 10*3/uL (ref 0.0–0.1)
Basophils Relative: 0 %
Eosinophils Absolute: 0.3 10*3/uL (ref 0.0–0.5)
Eosinophils Relative: 2 %
HCT: 27.2 % — ABNORMAL LOW (ref 36.0–46.0)
Hemoglobin: 8.6 g/dL — ABNORMAL LOW (ref 12.0–15.0)
Immature Granulocytes: 6 %
Lymphocytes Relative: 5 %
Lymphs Abs: 0.7 10*3/uL (ref 0.7–4.0)
MCH: 27 pg (ref 26.0–34.0)
MCHC: 31.6 g/dL (ref 30.0–36.0)
MCV: 85.5 fL (ref 80.0–100.0)
Monocytes Absolute: 1.1 10*3/uL — ABNORMAL HIGH (ref 0.1–1.0)
Monocytes Relative: 7 %
Neutro Abs: 11.9 10*3/uL — ABNORMAL HIGH (ref 1.7–7.7)
Neutrophils Relative %: 80 %
Platelets: 103 10*3/uL — ABNORMAL LOW (ref 150–400)
RBC: 3.18 MIL/uL — ABNORMAL LOW (ref 3.87–5.11)
RDW: 19.4 % — ABNORMAL HIGH (ref 11.5–15.5)
WBC: 15 10*3/uL — ABNORMAL HIGH (ref 4.0–10.5)
nRBC: 0 % (ref 0.0–0.2)

## 2020-06-10 LAB — COMPREHENSIVE METABOLIC PANEL
ALT: 16 U/L (ref 0–44)
AST: 16 U/L (ref 15–41)
Albumin: 2.3 g/dL — ABNORMAL LOW (ref 3.5–5.0)
Alkaline Phosphatase: 34 U/L — ABNORMAL LOW (ref 38–126)
Anion gap: 13 (ref 5–15)
BUN: 12 mg/dL (ref 6–20)
CO2: 22 mmol/L (ref 22–32)
Calcium: 7.9 mg/dL — ABNORMAL LOW (ref 8.9–10.3)
Chloride: 104 mmol/L (ref 98–111)
Creatinine, Ser: 0.87 mg/dL (ref 0.44–1.00)
GFR, Estimated: >60 mL/min (ref >60)
Glucose, Bld: 143 mg/dL — ABNORMAL HIGH (ref 70–99)
Potassium: 3.1 mmol/L — ABNORMAL LOW (ref 3.5–5.1)
Sodium: 139 mmol/L (ref 135–145)
Total Bilirubin: 0.9 mg/dL (ref 0.3–1.2)
Total Protein: 7.3 g/dL (ref 6.5–8.1)

## 2020-06-10 LAB — URINE CULTURE: Culture: NO GROWTH

## 2020-06-10 LAB — HIV ANTIBODY (ROUTINE TESTING W REFLEX): HIV Screen 4th Generation wRfx: NONREACTIVE

## 2020-06-10 LAB — MAGNESIUM: Magnesium: 1.3 mg/dL — ABNORMAL LOW (ref 1.7–2.4)

## 2020-06-10 MED ORDER — CHLORHEXIDINE GLUCONATE CLOTH 2 % EX PADS
6.0000 | MEDICATED_PAD | Freq: Every day | CUTANEOUS | Status: DC
  Administered 2020-06-10 – 2020-06-14 (×5): 6 via TOPICAL

## 2020-06-10 MED ORDER — POTASSIUM CHLORIDE CRYS ER 20 MEQ PO TBCR
40.0000 meq | EXTENDED_RELEASE_TABLET | ORAL | Status: AC
  Administered 2020-06-10 (×2): 40 meq via ORAL
  Filled 2020-06-10 (×2): qty 2

## 2020-06-10 MED ORDER — PANTOPRAZOLE SODIUM 40 MG PO TBEC
40.0000 mg | DELAYED_RELEASE_TABLET | Freq: Every day | ORAL | Status: DC
  Administered 2020-06-10 – 2020-06-14 (×5): 40 mg via ORAL
  Filled 2020-06-10 (×5): qty 1

## 2020-06-10 MED ORDER — MAGNESIUM SULFATE 4 GM/100ML IV SOLN
4.0000 g | Freq: Once | INTRAVENOUS | Status: AC
  Administered 2020-06-10: 4 g via INTRAVENOUS
  Filled 2020-06-10: qty 100

## 2020-06-10 NOTE — Progress Notes (Addendum)
HEMATOLOGY-ONCOLOGY PROGRESS NOTE  SUBJECTIVE: Ms. Hauth is followed by our office for multiple myeloma.  She was recently started on treatment with carfilzomib/Revlimid/dexamethasone.  Treatment was started on 06/06/2020.  The patient presented to the hospital with fever and arthralgias.  She had a fever up to 102 at home that was unresponsive to Tylenol.  She has also been having some diarrhea and shortness of breath.  Chest x-ray and UA unremarkable on admission.  Blood cultures obtained.  C. difficile antigen positive and C. difficile toxin negative-PCR pending.  GI panel pending.  This morning, the patient continues to have intermittent fevers.  She still has arthralgias and generalized fatigue.  Reports that her diarrhea has resolved.  She is not having any nausea or vomiting.  Oncology History  Multiple myeloma not having achieved remission ([place])  05/30/2020 Initial Diagnosis   Multiple myeloma not having achieved remission ([place])   06/06/2020 -  Chemotherapy    Patient is on Treatment Plan: MYELOMA NEWLY DIAGNOSED TRANSPLANT CANDIDATE CARFILZOMIB (20/36)/ LENALIDOMIDE / DEXAMETHASONE (40/20) (KRD) Q28D         REVIEW OF SYSTEMS:   Constitutional: She has been having fevers and chills Eyes: Denies blurriness of vision Ears, nose, mouth, throat, and face: Denies mucositis or sore throat Respiratory: Reports dyspnea Cardiovascular: Denies palpitation, chest discomfort Gastrointestinal: Denies nausea vomiting but had diarrhea on admission which is now resolved Skin: Denies abnormal skin rashes Lymphatics: Has bruising near Port-A-Cath that was recently placed Neurological:Denies numbness, tingling or new weaknesses Behavioral/Psych: Mood is stable, no new changes  Extremities: No lower extremity edema All other systems were reviewed with the patient and are negative.  I have reviewed the past medical history, past surgical history, social history and family history with the patient  and they are unchanged from previous note.   PHYSICAL EXAMINATION: ECOG PERFORMANCE STATUS: 1 - Symptomatic but completely ambulatory  Vitals:   06/10/20 0547 06/10/20 1001  BP: (!) 121/58 127/60  Pulse: 89 91  Resp: 14 16  Temp: 99.1 F (37.3 C) 99.2 F (37.3 C)  SpO2: 99% 100%   Filed Weights   06/09/20 1205  Weight: 63.4 kg    Intake/Output from previous day: 03/06 0701 - 03/07 0700 In: 1450 [P.O.:600; I.V.:750; IV Piggyback:100] Out: 7867 [Urine:1825]  GENERAL: Alert, no distress SKIN: skin color, texture, turgor are normal, no rashes or significant lesions EYES: normal, Conjunctiva are pink and non-injected, sclera clear OROPHARYNX:no exudate, no erythema and lips, buccal mucosa, and tongue normal  LUNGS: clear to auscultation and percussion with normal breathing effort HEART: regular rate & rhythm and no murmurs and no lower extremity edema ABDOMEN:abdomen soft, non-tender and normal bowel sounds NEURO: alert & oriented x 3 with fluent speech, no focal motor/sensory deficits  Port-A-Cath site with bruising but no erythema  LABORATORY DATA:  I have reviewed the data as listed CMP Latest Ref Rng & Units 06/10/2020 06/09/2020 06/06/2020  Glucose 70 - 99 mg/dL 143(H) 112(H) 126(H)  BUN 6 - 20 mg/dL 12 16 16   Creatinine 0.44 - 1.00 mg/dL 0.87 0.81 0.98  Sodium 135 - 145 mmol/L 139 135 137  Potassium 3.5 - 5.1 mmol/L 3.1(L) 3.5 3.8  Chloride 98 - 111 mmol/L 104 96(L) 103  CO2 22 - 32 mmol/L 22 25 24   Calcium 8.9 - 10.3 mg/dL 7.9(L) 8.8(L) 8.3(L)  Total Protein 6.5 - 8.1 g/dL 7.3 8.8(H) 9.4(H)  Total Bilirubin 0.3 - 1.2 mg/dL 0.9 1.1 0.3  Alkaline Phos 38 - 126 U/L 34(L) 39  54  AST 15 - 41 U/L 16 16 12(L)  ALT 0 - 44 U/L 16 15 9     Lab Results  Component Value Date   WBC 14.1 (H) 06/09/2020   HGB 9.9 (L) 06/09/2020   HCT 31.1 (L) 06/09/2020   MCV 85.4 06/09/2020   PLT 142 (L) 06/09/2020   NEUTROABS 11.7 (H) 06/09/2020    NM PET Image Initial (PI) Whole  Body  Result Date: 05/17/2020 CLINICAL DATA:  Initial treatment strategy for multiple myeloma. EXAM: NUCLEAR MEDICINE PET WHOLE BODY TECHNIQUE: 7.0 mCi F-18 FDG was injected intravenously. Full-ring PET imaging was performed from the head to foot after the radiotracer. CT data was obtained and used for attenuation correction and anatomic localization. Fasting blood glucose: 98 mg/dl COMPARISON:  Multiple exams, including CT chest report from 01/23/2019 FINDINGS: Mediastinal blood pool activity: SUV max 2.4 HEAD/NECK: No significant abnormal hypermetabolic activity in this region. Incidental CT findings: Chronic right maxillary sinusitis. Bilateral common carotid atherosclerotic calcification. CHEST: Sharply defined 3.1 by 1.2 cm ground-glass density lesion in the left upper lobe on image 93 of series 4 is hypermetabolic with maximum SUV 11.4. A smaller focus of subpleural ground-glass opacity in the left upper lobe measuring 1.3 by 0.8 cm on image 103 of series 4 has a maximum SUV of 3.1. 0.6 by 0.4 cm left lower lobe nodule on image 115 of series 4, without appreciable accentuated metabolic activity. Subpleural ground-glass nodularity in the left lower lobe measuring 0.9 by 0.6 cm without hypermetabolic activity on image 35 series 8. Mildly focally accentuated distal esophageal activity with maximum SUV 4.3. This is most commonly physiologic at this location. Incidental CT findings: Atherosclerotic calcification of the aortic arch and branch vessels. ABDOMEN/PELVIS: No significant abnormal hypermetabolic activity in this region. Incidental CT findings: Aortoiliac atherosclerotic vascular disease. Cholecystectomy. Sigmoid colon diverticulosis. Pelvic floor laxity with cystocele. SKELETON: Diffusely accentuated marrow activity throughout the axial skeleton and in much of the proximal appendicular skeleton, without focal lesions identified and without typical punched-out cortical lesions often encountered in the  setting of myeloma. The appearance on today's exam could be from granulocyte stimulation (correlate with any history of minute stray shin of granulocyte stimulating pharmaceuticals) or CT occult marrow infiltration. Incidental CT findings: Mild degenerative anterolisthesis of L5 on S1. EXTREMITIES: No significant abnormal hypermetabolic activity in this region. Incidental CT findings: Subcutaneous edema along the heels. IMPRESSION: 1. A 03.1 by 1.2 cm ground-glass density in the left upper lobe is notably hypermetabolic with maximum SUV 11.4, and a smaller focus of subpleural ground-glass opacity in the left upper lobe is mildly hypermetabolic. Possibilities may include lepidic growth lung cancer or atypical infectious process. Tissue sampling may be warranted. 2. Generalized accentuated marrow activity in the axial skeleton and proximal appendicular skeleton, symmetric and without focal lesions on the CT data. This could be from granulocyte stimulation (correlate with any medication aimed at granulocyte stimulation) or CT occult marrow infiltrative process. 3. Other imaging findings of potential clinical significance: Chronic right maxillary sinusitis. Aortic Atherosclerosis (ICD10-I70.0). Sigmoid colon diverticulosis. Pelvic floor laxity with cystocele. Electronically Signed   By: Van Clines M.D.   On: 05/17/2020 13:07   CT Biopsy  Result Date: 05/20/2020 INDICATION: 60 year old female with new diagnosis of multiple myeloma. Evaluate marrow involvement for staging. EXAM: CT GUIDED BONE MARROW ASPIRATION AND CORE BIOPSY Interventional Radiologist:  Criselda Peaches, MD MEDICATIONS: None. ANESTHESIA/SEDATION: Moderate (conscious) sedation was employed during this procedure. A total of 2 milligrams versed and 100 micrograms  fentanyl were administered intravenously. The patient's level of consciousness and vital signs were monitored continuously by radiology nursing throughout the procedure under my  direct supervision. Total monitored sedation time: 10 minutes FLUOROSCOPY TIME:  None. COMPLICATIONS: None immediate. Estimated blood loss: <25 mL PROCEDURE: Informed written consent was obtained from the patient after a thorough discussion of the procedural risks, benefits and alternatives. All questions were addressed. Maximal Sterile Barrier Technique was utilized including caps, mask, sterile gowns, sterile gloves, sterile drape, hand hygiene and skin antiseptic. A timeout was performed prior to the initiation of the procedure. The patient was positioned prone and non-contrast localization CT was performed of the pelvis to demonstrate the iliac marrow spaces. Maximal barrier sterile technique utilized including caps, mask, sterile gowns, sterile gloves, large sterile drape, hand hygiene, and betadine prep. Under sterile conditions and local anesthesia, an 11 gauge coaxial bone biopsy needle was advanced into the right iliac marrow space. Needle position was confirmed with CT imaging. Initially, bone marrow aspiration was performed. Next, the 11 gauge outer cannula was utilized to obtain a right iliac bone marrow core biopsy. Needle was removed. Hemostasis was obtained with compression. The patient tolerated the procedure well. Samples were prepared with the cytotechnologist. IMPRESSION: Technically successful CT-guided bone marrow aspiration and core biopsy of the right iliac bone. Electronically Signed   By: Jacqulynn Cadet M.D.   On: 05/20/2020 12:35   DG Chest Port 1 View  Result Date: 06/09/2020 CLINICAL DATA:  Fever and shortness of breath. EXAM: PORTABLE CHEST 1 VIEW COMPARISON:  None. FINDINGS: The cardiomediastinal silhouette is unremarkable. A RIGHT Port-A-Cath is noted with tip overlying the LOWER SVC. There is no evidence of focal airspace disease, pulmonary edema, suspicious pulmonary nodule/mass, pleural effusion, or pneumothorax. No acute bony abnormalities are identified. IMPRESSION: No  active disease. Electronically Signed   By: Margarette Canada M.D.   On: 06/09/2020 13:03   CT BONE MARROW BIOPSY & ASPIRATION  Result Date: 05/20/2020 INDICATION: 60 year old female with new diagnosis of multiple myeloma. Evaluate marrow involvement for staging. EXAM: CT GUIDED BONE MARROW ASPIRATION AND CORE BIOPSY Interventional Radiologist:  Criselda Peaches, MD MEDICATIONS: None. ANESTHESIA/SEDATION: Moderate (conscious) sedation was employed during this procedure. A total of 2 milligrams versed and 100 micrograms fentanyl were administered intravenously. The patient's level of consciousness and vital signs were monitored continuously by radiology nursing throughout the procedure under my direct supervision. Total monitored sedation time: 10 minutes FLUOROSCOPY TIME:  None. COMPLICATIONS: None immediate. Estimated blood loss: <25 mL PROCEDURE: Informed written consent was obtained from the patient after a thorough discussion of the procedural risks, benefits and alternatives. All questions were addressed. Maximal Sterile Barrier Technique was utilized including caps, mask, sterile gowns, sterile gloves, sterile drape, hand hygiene and skin antiseptic. A timeout was performed prior to the initiation of the procedure. The patient was positioned prone and non-contrast localization CT was performed of the pelvis to demonstrate the iliac marrow spaces. Maximal barrier sterile technique utilized including caps, mask, sterile gowns, sterile gloves, large sterile drape, hand hygiene, and betadine prep. Under sterile conditions and local anesthesia, an 11 gauge coaxial bone biopsy needle was advanced into the right iliac marrow space. Needle position was confirmed with CT imaging. Initially, bone marrow aspiration was performed. Next, the 11 gauge outer cannula was utilized to obtain a right iliac bone marrow core biopsy. Needle was removed. Hemostasis was obtained with compression. The patient tolerated the procedure  well. Samples were prepared with the cytotechnologist. IMPRESSION: Technically successful  CT-guided bone marrow aspiration and core biopsy of the right iliac bone. Electronically Signed   By: Jacqulynn Cadet M.D.   On: 05/20/2020 12:35   IR IMAGING GUIDED PORT INSERTION  Result Date: 06/05/2020 INDICATION: 60 year old female with history of multiple myeloma requiring central venous access for chemotherapy. EXAM: IMPLANTED PORT A CATH PLACEMENT WITH ULTRASOUND AND FLUOROSCOPIC GUIDANCE COMPARISON:  None. MEDICATIONS: None. ANESTHESIA/SEDATION: Moderate (conscious) sedation was employed during this procedure. A total of Versed 4 mg and Fentanyl 100 mcg was administered intravenously. Moderate Sedation Time: 15 minutes. The patient's level of consciousness and vital signs were monitored continuously by radiology nursing throughout the procedure under my direct supervision. CONTRAST:  None FLUOROSCOPY TIME:  0 minutes, 6 seconds (1 mGy) COMPLICATIONS: None immediate. PROCEDURE: The procedure, risks, benefits, and alternatives were explained to the patient. Questions regarding the procedure were encouraged and answered. The patient understands and consents to the procedure. The right neck and chest were prepped with chlorhexidine in a sterile fashion, and a sterile drape was applied covering the operative field. Maximum barrier sterile technique with sterile gowns and gloves were used for the procedure. A timeout was performed prior to the initiation of the procedure. Ultrasound was used to examine the jugular vein which was compressible and free of internal echoes. A skin marker was used to demarcate the planned venotomy and port pocket incision sites. Local anesthesia was provided to these sites and the subcutaneous tunnel track with 1% lidocaine with 1:100,000 epinephrine. A small incision was created at the jugular access site and blunt dissection was performed of the subcutaneous tissues. Under real time  ultrasound guidance, the jugular vein was accessed with a 21 ga micropuncture needle and an 0.018" wire was inserted to the superior vena cava. A 5 Fr micopuncture set was then used, through which a 0.035" Rosen wire was passed under fluoroscopic guidance into the inferior vena cava. An 8 Fr dilator was then placed over the wire. A subcutaneous port pocket was then created along the upper chest wall utilizing a combination of sharp and blunt dissection. The pocket was irrigated with sterile saline, packed with gauze, and observed for hemorrhage. A single lumen "ISP" sized power injectable port was chosen for placement. The 8 Fr catheter was tunneled from the port pocket site to the venotomy incision. The port was placed in the pocket. The external catheter was trimmed to appropriate length. The dilator was exchanged for an 8 Fr peel-away sheath under fluoroscopic guidance. The catheter was then placed through the sheath and the sheath was removed. Final catheter positioning was confirmed and documented with a fluoroscopic spot radiograph. The port was accessed with a Huber needle, aspirated, and flushed with heparinized saline. The deep dermal layer of the port pocket incision was closed with interrupted 3-0 Vicryl suture. The skin was opposed with a running subcuticular 4-0 Monocryl suture. Dermabond was then placed over the port pocket and neck incisions. The patient tolerated the procedure well without immediate post procedural complication. FINDINGS: After catheter placement, the tip lies within the cavoatrial junction the catheter aspirates and flushes normally and is ready for immediate use. IMPRESSION: Successful placement of a power injectable Port-A-Cath via the right internal jugular vein. The catheter is ready for immediate use. Ruthann Cancer, MD [facility] Electronically Signed   By: Ruthann Cancer MD   On: 06/05/2020 15:51    ASSESSMENT AND  PLAN: 60 year old female with  #1 Newly diagnosed multiple myeloma  RISS Stage  II, IgA kappa Presenting with anemia and renal insufficiency - no hypercalcemia Bone survey with no obvious skeletal lesions.  Baseline M spike of 4.7 g/dL.+ additional spike of 0.3g/dl Beta-2 Microglobulin at 2.5 Albumin 2 Bone marrow biopsy-showing 60 to 70% abnormal plasma cells which are kappa restricted consistent with multiple myeloma. Molecular cytogenetics-complex karyotype would duplication 1 q., monosomy 13, deletions of 14 q. and 16 q.  #2 anemia likely related to multiple myeloma.  Ferritin adequate B12 levels low normal.  #3 renal insufficiency creatinine 1.28 likely some element of renal insufficiency from myeloma.  #4 Fever  #5 Diarrhea, resolved - c diff antigen positive, c diff toxin negative, PCR pending  PLAN: -Discussed CBC from today including WBC of 15.0, hemoglobin 8.6, platelets 103,000. Leukocytosis may be in part due to recent steroids but cannot rule out infection.  Anemia due to underlying myeloma and recent chemotherapy and thrombocytopenia due to recent chemotherapy.  Recommend close monitoring of blood counts and transfuse PRBCs for hemoglobin less than 8 or platelets less than 10,000 or active bleeding. -Discussed work-up to date with the patient and her husband including negative chest x-ray, negative urine culture, negative to date blood cultures, C. difficile results including C. difficile antigen positive and C. difficile toxin negative with PCR pending, and pending GI panel.  Await further results of cultures.  Continue broad-spectrum antibiotics for now and monitor fever. -Await C. difficile PCR and consider treatment with oral vancomycin if positive. -Okay to hold Revlimid and dexamethasone for now pending work-up and resolution of fevers.  Continue Eliquis and acyclovir.   LOS: 0 days   Mikey Bussing, DNP, AGPCNP-BC, AOCNP 06/10/20    ADDENDUM  .Patient was  Personally and independently interviewed, examined and relevant elements of the history of present illness were reviewed in details and an assessment and plan was created. All elements of the patient's history of present illness , assessment and plan were discussed in details with Mikey Bussing, DNP, AGPCNP-BC, AOCNP. The above documentation reflects our combined findings assessment and plan.  Patient notes that she is feeling much better today.  No fever spikes today.  No diarrhea since admission. Blood cultures urine cultures chest x-ray unrevealing for site of infection.  Port-A-Cath shows some bruising but no signs of infection. Stool testing positive for C. difficile antigen but negative for toxin.  We discussed that her diarrhea was likely related to her new treatment with Revlimid.  If C. difficile PCR negative may use Imodium as needed for diarrhea related to Revlimid.  Her fevers are likely drug-related fevers from her carfilzomib.  We will split her dexamethasone and use as premedication at 20 mg along with Tylenol 1000 mg with each dose of carfilzomib. Patient might need to repeat Tylenol 650 mg 6 hours after her first dose of Tylenol to suppress drug-related fevers.  Discussed with Dr. Grandville Silos.  No further fevers and patient is feeling well and no other cultures turn out positive patient could potentially go home tomorrow. She should maintain her appointment for her next dose of carfilzomib on 06/13/2020 & 06/14/2020 and may restart Revlimid after 1 week if no further fevers or signs of infection.  Patient does have follow-up with [place] in clinic on 06/13/2020      Sullivan Lone MD MS

## 2020-06-10 NOTE — Progress Notes (Signed)
   06/10/20 1840  Assess: MEWS Score  Temp (!) 101 F (38.3 C)  BP 139/64  Pulse Rate (!) 101  Resp 16  SpO2 100 %  Assess: MEWS Score  MEWS Temp 1  MEWS Systolic 0  MEWS Pulse 1  MEWS RR 0  MEWS LOC 0  MEWS Score 2  MEWS Score Color Yellow  Assess: if the MEWS score is Yellow or Red  Were vital signs taken at a resting state? Yes  Focused Assessment No change from prior assessment  Early Detection of Sepsis Score *See Row Information* Low  MEWS guidelines implemented *See Row Information* Yes  Treat  MEWS Interventions Administered prn meds/treatments  Pain Scale 0-10  Pain Score 0  Take Vital Signs  Increase Vital Sign Frequency  Yellow: Q 2hr X 2 then Q 4hr X 2, if remains yellow, continue Q 4hrs  Escalate  MEWS: Escalate Yellow: discuss with charge nurse/RN and consider discussing with provider and RRT  Notify: Charge Nurse/RN  Name of Charge Nurse/RN Notified [place], rn  Date Charge Nurse/RN Notified 06/10/20  Time Charge Nurse/RN Notified [place]  Notify: Provider  Provider Name/Title thompson  Date Provider Notified 06/10/20  Time Provider Notified [id number]  Notification Type Page  Notification Reason Other (Comment);Change in status  Provider response No new orders  Document  Progress note created (see row info) Yes   Tylenol given for patients temperature. Will recheck in an hour

## 2020-06-10 NOTE — Progress Notes (Signed)
PROGRESS NOTE    Megan Velasquez  TMH:[id number] DOB: 1961/01/12 DOA: 06/09/2020 PCP: [name], MD    Chief Complaint  Patient presents with  . chemo card  . Generalized Body Aches  . Fever  . Shortness of Breath    Brief Narrative:  Patient 60 year old female history of multiple myeloma, GERD, hypertension being followed by Dr. Claiborne Billings had a Port-A-Cath placed 4 days prior to admission, started chemotherapy 3 days prior to admission had 2 sessions and subsequently developed fevers, myalgias, fatigue.  Patient noted to have temps up high as 102 which were on responsive to Tylenol prior to admission.  Patient presented to the ED.  Chest x-ray done unremarkable.  Urinalysis unremarkable.  Patient noted to be tachycardic with a leukocytosis given a dose of IV vancomycin IV cefepime in the ED and subsequently admitted. Patient noted when she got to the floor to have a temperature of 103.  Patient pancultured.  Patient placed empirically on IV cefepime.  Stool studies obtained. Oncology informed of admission and are following.   Assessment & Plan:   Principal Problem:   FUO (fever of unknown origin) Active Problems:   Multiple myeloma not having achieved remission (HCC)   Dehydration   Hypokalemia   Hypomagnesemia   Gastroesophageal reflux disease   Essential hypertension   Anemia  #1 fever of unknown origin ?  Etiology.  Patient presented with fever, myalgias, generalized fatigue.  Fevers not responding to Tylenol at home prior to admission.  Patient with multiple myeloma and recently started on chemotherapy.  Concern symptoms might be secondary to chemo.  Patient noted to have had 2-3 bouts of diarrhea at home prior to admission and none since.   -GI pathogen panel negative.  C. difficile PCR positive antigen, negative toxin.  - Patient with no bowel movements since admission. -Urine cultures negative -Blood cultures with no growth to date.  MRSA PCR negative. -Port-A-Cath site  with no signs of infection noted. -Continue empiric IV cefepime.  IV fluids.  Supportive care.  Oncology following. -If ongoing fevers may consider CT abdomen and pelvis.  2.  Dehydration IV fluids.  DC HCTZ  3.  Hypokalemia/hypomagnesemia Likely secondary to some GI losses in the setting of diuretics.  Discontinue HCTZ.  K. Dur 40 mEq p.o. every 4 hours x2 doses.  Magnesium sulfate 4 g IV x1.  Repeat labs in the morning including a phosphorus level.  4.  Multiple myeloma on chemotherapy -On induction therapy with carfilzomib/Revlimid/dexamethasone -Continue aspirin and Eliquis due to Revlimid -Continue acyclovir for shingles prophylaxis. -Admitting MD spoke with Dr. Alen Blew about chemo regimen, felt this medication should not be immediately immunosuppressive and regimen had been held due to concern for infection per protocol. -Oncology following.  5.  Gastroesophageal reflux disease PPI.  6.  Hypertension Continue lisinopril.  Discontinue HCTZ due to electrolyte abnormalities and dehydration.  Follow.  7.  Anemia Likely secondary to problem #4.  Patient with no overt bleeding.  Check an anemia panel.  Follow H&H.  Transfusion threshold hemoglobin < 7.    DVT prophylaxis: Eliquis Code Status: Full Family Communication: Updated patient and husband at bedside. Disposition:   Status is: Inpatient    Dispo: The patient is from: Home              Anticipated d/c is to: Home              Patient currently being worked up for fever in immunocompromised patient.  On IV antibiotics.  Not stable for discharge.   Difficult to place patient no       Consultants:   Oncology  Procedures:   Chest x-ray 06/09/2020    Antimicrobials:   IV cefepime 06/09/2020>>>>   Subjective: Laying in bed.  States she is feeling a little bit better than on admission.  No nausea or emesis.  No significant abdominal pain.  Denies any further loose stools/diarrhea.  Husband at bedside.  T-max  103 at 1540 hrs.(06/09/2020)  Objective: Vitals:   06/10/20 0001 06/10/20 0547 06/10/20 1001 06/10/20 1444  BP: (!) 147/65 (!) 121/58 127/60 (!) 130/52  Pulse: (!) 105 89 91 92  Resp: _0 Temp: 99.4 F (37.4 C) 99.1 F (37.3 C) 99.2 F (37.3 C) 99.1 F (37.3 C)  TempSrc: Oral Oral Oral Oral  SpO2: 98% 99% 100% 100%  Weight:      Height:        Intake/Output Summary (Last 24 hours) at 06/10/2020 1538 Last data filed at 06/10/2020 1444 Gross per 24 hour  Intake 1690 ml  Output 2725 ml  Net -1035 ml   Filed Weights   06/09/20 1205  Weight: 63.4 kg    Examination:  General exam: Appears calm and comfortable.  Port-A-Cath right upper chest wall with some surrounding ecchymosis, no significant tenderness to palpation, no signs of infection around Port-A-Cath site. Respiratory system: Clear to auscultation. Respiratory effort normal. Cardiovascular system: S1 & S2 heard, RRR. No JVD, murmurs, rubs, gallops or clicks. No pedal edema. Gastrointestinal system: Abdomen is nondistended, soft and nontender. No organomegaly or masses felt. Normal bowel sounds heard. Central nervous system: Alert and oriented. No focal neurological deficits. Extremities: Symmetric 5 x 5 power. Skin: No rashes, lesions or ulcers Psychiatry: Judgement and insight appear normal. Mood & affect appropriate.     Data Reviewed: I have personally reviewed following labs and imaging studies  CBC: Recent Labs  Lab 06/05/20 1300 06/06/20 0945 06/09/20 1208 06/10/20 0957  WBC 6.9 7.5 14.1* 15.0*  NEUTROABS 4.0 4.7 11.7* 11.9*  HGB 10.3* 9.1* 9.9* 8.6*  HCT 33.7* 28.9* 31.1* 27.2*  MCV 88.0 85.0 85.4 85.5  PLT 164 163 142* 103*    Basic Metabolic Panel: Recent Labs  Lab 06/06/20 0945 06/09/20 1208 06/10/20 0957  NA 137 135 139  K 3.8 3.5 3.1*  CL 103 96* 104  CO2 _1 GLUCOSE 126* 112* 143*  BUN _2 CREATININE 0.98 0.81 0.87  CALCIUM 8.3* 8.8* 7.9*  MG  --   --  1.3*     GFR: Estimated Creatinine Clearance: 61.9 mL/min (by C-G formula based on SCr of 0.87 mg/dL).  Liver Function Tests: Recent Labs  Lab 06/06/20 0945 06/09/20 1208 06/10/20 0957  AST 12* 16 16  ALT _3 ALKPHOS 54 39 34*  BILITOT 0.3 1.1 0.9  PROT 9.4* 8.8* 7.3  ALBUMIN 2.8* 3.0* 2.3*    CBG: No results for input(s): GLUCAP in the last 168 hours.   Recent Results (from the past 240 hour(s))  Resp Panel by RT-PCR (Flu A&B, Covid) Nasopharyngeal Swab     Status: None   Collection Time: 06/09/20 12:08 PM   Specimen: Nasopharyngeal Swab; Nasopharyngeal(NP) swabs in vial transport medium  Result Value Ref Range Status   SARS Coronavirus 2 by RT PCR NEGATIVE NEGATIVE Final    Comment: (NOTE) SARS-CoV-2 target nucleic acids are NOT DETECTED.  The SARS-CoV-2 RNA is generally detectable in upper respiratory  specimens during the acute phase of infection. The lowest concentration of SARS-CoV-2 viral copies this assay can detect is 138 copies/mL. A negative result does not preclude SARS-Cov-2 infection and should not be used as the sole basis for treatment or other patient management decisions. A negative result may occur with  improper specimen collection/handling, submission of specimen other than nasopharyngeal swab, presence of viral mutation(s) within the areas targeted by this assay, and inadequate number of viral copies(<138 copies/mL). A negative result must be combined with clinical observations, patient history, and epidemiological information. The expected result is Negative.  Fact Sheet for Patients:  EntrepreneurPulse.com.au  Fact Sheet for Healthcare Providers:  IncredibleEmployment.be  This test is no t yet approved or cleared by the [place] FDA and  has been authorized for detection and/or diagnosis of SARS-CoV-2 by FDA under an Emergency Use Authorization (EUA). This EUA will remain  in effect (meaning this test  can be used) for the duration of the COVID-19 declaration under Section 564(b)(1) of the Act, 21 U.S.C.section 360bbb-3(b)(1), unless the authorization is terminated  or revoked sooner.       Influenza A by PCR NEGATIVE NEGATIVE Final   Influenza B by PCR NEGATIVE NEGATIVE Final    Comment: (NOTE) The Xpert Xpress SARS-CoV-2/FLU/RSV plus assay is intended as an aid in the diagnosis of influenza from Nasopharyngeal swab specimens and should not be used as a sole basis for treatment. Nasal washings and aspirates are unacceptable for Xpert Xpress SARS-CoV-2/FLU/RSV testing.  Fact Sheet for Patients: EntrepreneurPulse.com.au  Fact Sheet for Healthcare Providers: IncredibleEmployment.be  This test is not yet approved or cleared by the [place] FDA and has been authorized for detection and/or diagnosis of SARS-CoV-2 by FDA under an Emergency Use Authorization (EUA). This EUA will remain in effect (meaning this test can be used) for the duration of the COVID-19 declaration under Section 564(b)(1) of the Act, 21 U.S.C. section 360bbb-3(b)(1), unless the authorization is terminated or revoked.  Performed at [facility], [place] [address] [place], [place] [address]   Blood Culture (routine x 2)     Status: None (Preliminary result)   Collection Time: 06/09/20 12:08 PM   Specimen: BLOOD  Result Value Ref Range Status   Specimen Description   Final    BLOOD BLOOD LEFT HAND Performed at [facility] [address], [place], [place] [address]    Special Requests   Final    BOTTLES DRAWN AEROBIC AND ANAEROBIC Blood Culture adequate volume Performed at [facility] [address], [place], [place] [address]    Culture   Final    NO GROWTH < 24 HOURS Performed at [facility] [address] [place], [place] [address]    Report Status PENDING  Incomplete  Urine culture     Status:  None   Collection Time: 06/09/20 12:08 PM   Specimen: In/Out Cath Urine  Result Value Ref Range Status   Specimen Description   Final    IN/OUT CATH URINE Performed at [facility] [address] [place], [place] [address]    Special Requests   Final    NONE Performed at [facility], [place] [address], [place], [place] [address]    Culture   Final    NO GROWTH Performed at [facility], [place] [address] [place],  [address]    Report Status 06/10/2020 FINAL  Final  Blood Culture (routine x 2)  Status: None (Preliminary result)   Collection Time: 06/09/20 12:13 PM   Specimen: BLOOD  Result Value Ref Range Status   Specimen Description   Final    BLOOD LEFT ANTECUBITAL Performed at [facility] [address], [place], [place] [address]    Special Requests   Final    BOTTLES DRAWN AEROBIC AND ANAEROBIC Blood Culture adequate volume Performed at [facility] [address], [place], [place] [address]    Culture   Final    NO GROWTH < 24 HOURS Performed at [facility] [address] [place], [place] [address]    Report Status PENDING  Incomplete  Gastrointestinal Panel by PCR , Stool     Status: None   Collection Time: 06/09/20  7:00 PM   Specimen: Stool  Result Value Ref Range Status   Campylobacter species NOT DETECTED NOT DETECTED Final   Plesimonas shigelloides NOT DETECTED NOT DETECTED Final   Salmonella species NOT DETECTED NOT DETECTED Final   Yersinia enterocolitica NOT DETECTED NOT DETECTED Final   Vibrio species NOT DETECTED NOT DETECTED Final   Vibrio cholerae NOT DETECTED NOT DETECTED Final   Enteroaggregative E coli (EAEC) NOT DETECTED NOT DETECTED Final   Enteropathogenic E coli (EPEC) NOT DETECTED NOT DETECTED Final   Enterotoxigenic E coli (ETEC) NOT DETECTED NOT DETECTED Final   Shiga like toxin producing E coli (STEC) NOT DETECTED NOT DETECTED Final    Shigella/Enteroinvasive E coli (EIEC) NOT DETECTED NOT DETECTED Final   Cryptosporidium NOT DETECTED NOT DETECTED Final   Cyclospora cayetanensis NOT DETECTED NOT DETECTED Final   Entamoeba histolytica NOT DETECTED NOT DETECTED Final   Giardia lamblia NOT DETECTED NOT DETECTED Final   Adenovirus F40/41 NOT DETECTED NOT DETECTED Final   Astrovirus NOT DETECTED NOT DETECTED Final   Norovirus GI/GII NOT DETECTED NOT DETECTED Final   Rotavirus A NOT DETECTED NOT DETECTED Final   Sapovirus (I, II, IV, and V) NOT DETECTED NOT DETECTED Final    Comment: Performed at [facility], [place] [place], [place] [address]  C Difficile Quick Screen w PCR reflex     Status: Abnormal   Collection Time: 06/09/20  7:00 PM   Specimen: Stool  Result Value Ref Range Status   C Diff antigen POSITIVE (A) NEGATIVE Final   C Diff toxin NEGATIVE NEGATIVE Final   C Diff interpretation Results are indeterminate. See PCR results.  Final    Comment: Performed at [facility], [place] [address], [place], [place] [address]  MRSA PCR Screening     Status: None   Collection Time: 06/09/20  8:00 PM   Specimen: Nasopharyngeal  Result Value Ref Range Status   MRSA by PCR NEGATIVE NEGATIVE Final    Comment:        The GeneXpert MRSA Assay (FDA approved for NASAL specimens only), is one component of a comprehensive MRSA colonization surveillance program. It is not intended to diagnose MRSA infection nor to guide or monitor treatment for MRSA infections. Performed at [facility], [place] [address] [place], [place] [address]          Radiology Studies: [facility] Chest Port 1 View  Result Date: 06/09/2020 CLINICAL DATA:  Fever and shortness of breath. EXAM: PORTABLE CHEST 1 VIEW COMPARISON:  None. FINDINGS: The cardiomediastinal silhouette is unremarkable. A RIGHT Port-A-Cath is noted with tip overlying the LOWER SVC. There is no evidence of focal airspace disease,  pulmonary edema, suspicious  pulmonary nodule/mass, pleural effusion, or pneumothorax. No acute bony abnormalities are identified. IMPRESSION: No active disease. Electronically Signed   By: Margarette Canada M.D.   On: 06/09/2020 13:03        Scheduled Meds: . acyclovir  400 mg Oral BID  . apixaban  2.5 mg Oral BID  . vitamin C  1,000 mg Oral q morning  . B-complex with vitamin C  1 tablet Oral Daily  . Chlorhexidine Gluconate Cloth  6 each Topical Daily  . cholecalciferol  5,000 Units Oral q morning  . lisinopril  10 mg Oral Daily  . loratadine  10 mg Oral q morning  . pantoprazole  40 mg Oral Daily  . potassium chloride  40 mEq Oral Q4H  . vitamin B-12  3,000 mcg Oral Daily   Continuous Infusions: . sodium chloride 100 mL/hr at 06/10/20 0024  . ceFEPime (MAXIPIME) IV 2 g (06/10/20 1407)     LOS: 0 days    Time spent: 40 minutes    Irine Seal, MD [facility]   To contact the attending provider between 7A-7P or the covering provider during after hours 7P-7A, please log into the web site [email or web address] and access using universal [place] password for that web site. If you do not have the password, please call the hospital operator.  06/10/2020, 3:38 PM

## 2020-06-11 ENCOUNTER — Other Ambulatory Visit: Payer: BC Managed Care – PPO

## 2020-06-11 LAB — FOLATE: Folate: 30.5 ng/mL (ref >5.9)

## 2020-06-11 LAB — CBC WITH DIFFERENTIAL/PLATELET
Abs Immature Granulocytes: 1 10*3/uL — ABNORMAL HIGH (ref 0.00–0.07)
Basophils Absolute: 0 10*3/uL (ref 0.0–0.1)
Basophils Relative: 0 %
Eosinophils Absolute: 0.3 10*3/uL (ref 0.0–0.5)
Eosinophils Relative: 2 %
HCT: 26.2 % — ABNORMAL LOW (ref 36.0–46.0)
Hemoglobin: 8.2 g/dL — ABNORMAL LOW (ref 12.0–15.0)
Immature Granulocytes: 6 %
Lymphocytes Relative: 4 %
Lymphs Abs: 0.7 10*3/uL (ref 0.7–4.0)
MCH: 27.3 pg (ref 26.0–34.0)
MCHC: 31.3 g/dL (ref 30.0–36.0)
MCV: 87.3 fL (ref 80.0–100.0)
Monocytes Absolute: 1.3 10*3/uL — ABNORMAL HIGH (ref 0.1–1.0)
Monocytes Relative: 7 %
Neutro Abs: 14.8 10*3/uL — ABNORMAL HIGH (ref 1.7–7.7)
Neutrophils Relative %: 81 %
Platelets: 85 10*3/uL — ABNORMAL LOW (ref 150–400)
RBC: 3 MIL/uL — ABNORMAL LOW (ref 3.87–5.11)
RDW: 20.1 % — ABNORMAL HIGH (ref 11.5–15.5)
WBC: 18.1 10*3/uL — ABNORMAL HIGH (ref 4.0–10.5)
nRBC: 0 % (ref 0.0–0.2)

## 2020-06-11 LAB — RENAL FUNCTION PANEL
Albumin: 2.3 g/dL — ABNORMAL LOW (ref 3.5–5.0)
Anion gap: 8 (ref 5–15)
BUN: 10 mg/dL (ref 6–20)
CO2: 22 mmol/L (ref 22–32)
Calcium: 7.9 mg/dL — ABNORMAL LOW (ref 8.9–10.3)
Chloride: 104 mmol/L (ref 98–111)
Creatinine, Ser: 0.73 mg/dL (ref 0.44–1.00)
GFR, Estimated: >60 mL/min (ref >60)
Glucose, Bld: 103 mg/dL — ABNORMAL HIGH (ref 70–99)
Phosphorus: 2 mg/dL — ABNORMAL LOW (ref 2.5–4.6)
Potassium: 4.4 mmol/L (ref 3.5–5.1)
Sodium: 134 mmol/L — ABNORMAL LOW (ref 135–145)

## 2020-06-11 LAB — IRON AND TIBC
Iron: 13 ug/dL — ABNORMAL LOW (ref 28–170)
Saturation Ratios: 8 % — ABNORMAL LOW (ref 10.4–31.8)
TIBC: 162 ug/dL — ABNORMAL LOW (ref 250–450)
UIBC: 149 ug/dL

## 2020-06-11 LAB — MAGNESIUM: Magnesium: 2 mg/dL (ref 1.7–2.4)

## 2020-06-11 LAB — FERRITIN: Ferritin: 1397 ng/mL — ABNORMAL HIGH (ref 11–307)

## 2020-06-11 LAB — VITAMIN B12: Vitamin B-12: 888 pg/mL (ref 180–914)

## 2020-06-11 MED ORDER — K PHOS MONO-SOD PHOS DI & MONO 155-852-130 MG PO TABS
250.0000 mg | ORAL_TABLET | Freq: Two times a day (BID) | ORAL | Status: DC
  Administered 2020-06-11 – 2020-06-14 (×7): 250 mg via ORAL
  Filled 2020-06-11 (×7): qty 1

## 2020-06-11 MED ORDER — HYDROXYZINE HCL 25 MG PO TABS
25.0000 mg | ORAL_TABLET | Freq: Three times a day (TID) | ORAL | Status: DC | PRN
  Administered 2020-06-11 – 2020-06-12 (×2): 25 mg via ORAL
  Filled 2020-06-11 (×2): qty 1

## 2020-06-11 NOTE — Progress Notes (Addendum)
PROGRESS NOTE    Megan Velasquez  IHK:[id number] DOB: Jan 02, 1961 DOA: 06/09/2020 PCP: [name], MD    Chief Complaint  Patient presents with  . chemo card  . Generalized Body Aches  . Fever  . Shortness of Breath    Brief Narrative:  Patient 60 year old female history of multiple myeloma, GERD, hypertension being followed by Dr. Claiborne Billings had a Port-A-Cath placed 4 days prior to admission, started chemotherapy 3 days prior to admission had 2 sessions and subsequently developed fevers, myalgias, fatigue.  Patient noted to have temps up high as 102 which were on responsive to Tylenol prior to admission.  Patient presented to the ED.  Chest x-ray done unremarkable.  Urinalysis unremarkable.  Patient noted to be tachycardic with a leukocytosis given a dose of IV vancomycin IV cefepime in the ED and subsequently admitted. Patient noted when she got to the floor to have a temperature of 103.  Patient pancultured.  Patient placed empirically on IV cefepime.  Stool studies obtained. Oncology informed of admission and are following.   Assessment & Plan:   Principal Problem:   FUO (fever of unknown origin) Active Problems:   Multiple myeloma not having achieved remission (HCC)   Dehydration   Hypokalemia   Hypomagnesemia   Gastroesophageal reflux disease   Essential hypertension   Anemia   Fever, unknown origin  1 fever of unknown origin ?  Etiology.  Patient presented with fever, myalgias, generalized fatigue.  Fevers not responding to Tylenol at home prior to admission.  Patient with multiple myeloma and recently started on chemotherapy.  Concern symptoms might be secondary to chemo.  Patient noted to have had 2-3 bouts of diarrhea at home prior to admission and none since.   -GI pathogen panel negative.  C. difficile PCR positive antigen, negative toxin.  -Patient with no significant loose bowel movements. - Patient with 2-3 soft/mushy bowel movements today. -Urine cultures  negative -Blood cultures with no growth to date.  MRSA PCR negative. -Port-A-Cath site with no signs of infection noted. -Patient still with low-grade fevers however fever curve trending down.  Patient also with a leukocytosis that is trending up. -If ongoing fevers and worsening leukocytosis may consider CT abdomen and pelvis. -Continue empiric IV cefepime, supportive care.  Oncology following.  2.  Dehydration HCTZ discontinued.  Saline lock IV fluids.  Follow.    3.  Hypokalemia/hypomagnesemia/hypophosphatemia Likely secondary to some GI losses in the setting of diuretics.  HCTZ discontinued.  Potassium at 4.4.  Magnesium at 2.0.  Phosphorus level at 2.0.  K-Phos to 50 mg p.o. twice daily x5 days.  Repeat labs in the morning.   4.  Multiple myeloma on chemotherapy -On induction therapy with carfilzomib/Revlimid/dexamethasone -Continue aspirin and Eliquis due to Revlimid -Continue acyclovir for shingles prophylaxis. -Admitting MD spoke with Dr. Alen Blew about chemo regimen, felt this medication should not be immediately immunosuppressive and regimen had been held due to concern for infection per protocol. -Oncology following.  5.  Gastroesophageal reflux disease Continue PPI.   6.  Hypertension Currently stable.  Continue lisinopril.  HCTZ discontinued due to dehydration or electrolyte abnormalities.   7.  Anemia Likely secondary to problem #4.  Patient with no overt bleeding.  Anemia panel with iron of 13, ferritin of 1397, folate of 30.5, vitamin B12 of 888.  Hemoglobin currently at 8.2. ??  IV iron however will defer to hematology/oncology.  Transfusion threshold hemoglobin < 7.  Follow H&H.  8.  Hypophosphatemia K-Phos 250 mg p.o.  twice daily x5 days.  Repeat labs in the morning.    DVT prophylaxis: Eliquis Code Status: Full Family Communication: Updated patient and husband at bedside. Disposition:   Status is: Inpatient    Dispo: The patient is from: Home               Anticipated d/c is to: Home              Patient currently being worked up for fever in immunocompromised patient.  Patient with worsening leukocytosis.  On IV antibiotics.  Not stable for discharge.   Difficult to place patient no       Consultants:   Oncology: Dr.Kale  Procedures:   Chest x-ray 06/09/2020    Antimicrobials:   IV cefepime 06/09/2020>>>>   Subjective: Laying in bed.  Complains of some shortness of breath however noted not to be hypoxic per RN.  States no significant change since admission.  Still with some generalized weakness.  Stated she has had some loose stools today however on further questioning stools are more mushy.  No significant abdominal pain.  Husband at bedside.  Patient noted to have a T-max of 101 at 1840 hrs. (06/10/2020 ), temperature 100.1 this morning.   Objective: Vitals:   06/10/20 2255 06/11/20 0240 06/11/20 0547 06/11/20 0802  BP: 127/64 121/60 (!) 148/66   Pulse: 88 84 100   Resp: _0 Temp: 98.9 F (37.2 C) 98.6 F (37 C) 99.4 F (37.4 C) 100.1 F (37.8 C)  TempSrc: Oral Oral Oral Oral  SpO2: 100% 98% 98%   Weight:      Height:        Intake/Output Summary (Last 24 hours) at 06/11/2020 1352 Last data filed at 06/11/2020 0817 Gross per 24 hour  Intake 1206.86 ml  Output 2450 ml  Net -1243.14 ml   Filed Weights   06/09/20 1205  Weight: 63.4 kg    Examination:  General exam: NAD.  Port-A-Cath right upper chest wall with some surrounding ecchymosis.  No significant tenderness to palpation.  No signs of infection around Port-A-Cath site.  Respiratory system: CTA B.  No wheezes, no crackles, no rhonchi.  Normal respiratory effort.  Cardiovascular system: Regular rate rhythm no murmurs rubs or gallops.  No JVD.  No lower extremity edema. Gastrointestinal system: Abdomen is soft, nontender, nondistended, positive bowel sounds.  No rebound.  No guarding.  Central nervous system: Alert and oriented. No focal neurological  deficits. Extremities: Symmetric 5 x 5 power. Skin: No rashes, lesions or ulcers Psychiatry: Judgement and insight appear normal. Mood & affect appropriate.     Data Reviewed: I have personally reviewed following labs and imaging studies  CBC: Recent Labs  Lab 06/05/20 1300 06/06/20 0945 06/09/20 1208 06/10/20 0957 06/11/20 0537  WBC 6.9 7.5 14.1* 15.0* 18.1*  NEUTROABS 4.0 4.7 11.7* 11.9* 14.8*  HGB 10.3* 9.1* 9.9* 8.6* 8.2*  HCT 33.7* 28.9* 31.1* 27.2* 26.2*  MCV 88.0 85.0 85.4 85.5 87.3  PLT 164 163 142* 103* 85*    Basic Metabolic Panel: Recent Labs  Lab 06/06/20 0945 06/09/20 1208 06/10/20 0957 06/11/20 0537  NA 137 135 139 134*  K 3.8 3.5 3.1* 4.4  CL 103 96* 104 104  CO2 _1 GLUCOSE 126* 112* 143* 103*  BUN _2 CREATININE 0.98 0.81 0.87 0.73  CALCIUM 8.3* 8.8* 7.9* 7.9*  MG  --   --  1.3* 2.0  PHOS  --   --   --  2.0*    GFR: Estimated Creatinine Clearance: 67.3 mL/min (by C-G formula based on SCr of 0.73 mg/dL).  Liver Function Tests: Recent Labs  Lab 06/06/20 0945 06/09/20 1208 06/10/20 0957 06/11/20 0537  AST 12* 16 16  --   ALT _0 --   ALKPHOS 54 39 34*  --   BILITOT 0.3 1.1 0.9  --   PROT 9.4* 8.8* 7.3  --   ALBUMIN 2.8* 3.0* 2.3* 2.3*    CBG: No results for input(s): GLUCAP in the last 168 hours.   Recent Results (from the past 240 hour(s))  Resp Panel by RT-PCR (Flu A&B, Covid) Nasopharyngeal Swab     Status: None   Collection Time: 06/09/20 12:08 PM   Specimen: Nasopharyngeal Swab; Nasopharyngeal(NP) swabs in vial transport medium  Result Value Ref Range Status   SARS Coronavirus 2 by RT PCR NEGATIVE NEGATIVE Final    Comment: (NOTE) SARS-CoV-2 target nucleic acids are NOT DETECTED.  The SARS-CoV-2 RNA is generally detectable in upper respiratory specimens during the acute phase of infection. The lowest concentration of SARS-CoV-2 viral copies this assay can detect is 138 copies/mL. A negative result  does not preclude SARS-Cov-2 infection and should not be used as the sole basis for treatment or other patient management decisions. A negative result may occur with  improper specimen collection/handling, submission of specimen other than nasopharyngeal swab, presence of viral mutation(s) within the areas targeted by this assay, and inadequate number of viral copies(<138 copies/mL). A negative result must be combined with clinical observations, patient history, and epidemiological information. The expected result is Negative.  Fact Sheet for Patients:  EntrepreneurPulse.com.au  Fact Sheet for Healthcare Providers:  IncredibleEmployment.be  This test is no t yet approved or cleared by the [place] FDA and  has been authorized for detection and/or diagnosis of SARS-CoV-2 by FDA under an Emergency Use Authorization (EUA). This EUA will remain  in effect (meaning this test can be used) for the duration of the COVID-19 declaration under Section 564(b)(1) of the Act, 21 U.S.C.section 360bbb-3(b)(1), unless the authorization is terminated  or revoked sooner.       Influenza A by PCR NEGATIVE NEGATIVE Final   Influenza B by PCR NEGATIVE NEGATIVE Final    Comment: (NOTE) The Xpert Xpress SARS-CoV-2/FLU/RSV plus assay is intended as an aid in the diagnosis of influenza from Nasopharyngeal swab specimens and should not be used as a sole basis for treatment. Nasal washings and aspirates are unacceptable for Xpert Xpress SARS-CoV-2/FLU/RSV testing.  Fact Sheet for Patients: EntrepreneurPulse.com.au  Fact Sheet for Healthcare Providers: IncredibleEmployment.be  This test is not yet approved or cleared by the [place] FDA and has been authorized for detection and/or diagnosis of SARS-CoV-2 by FDA under an Emergency Use Authorization (EUA). This EUA will remain in effect (meaning this test can be used) for  the duration of the COVID-19 declaration under Section 564(b)(1) of the Act, 21 U.S.C. section 360bbb-3(b)(1), unless the authorization is terminated or revoked.  Performed at [facility], [place] [address] [place], [place] [address]   Blood Culture (routine x 2)     Status: None (Preliminary result)   Collection Time: 06/09/20 12:08 PM   Specimen: BLOOD  Result Value Ref Range Status   Specimen Description   Final    BLOOD BLOOD LEFT HAND Performed at [facility] [address] [place], [place] [address]    Special Requests   Final    BOTTLES  DRAWN AEROBIC AND ANAEROBIC Blood Culture adequate volume Performed at [facility] [address] [place], [place] [address]    Culture   Final    NO GROWTH 2 DAYS Performed at [facility] [address] [place], [place] [address]    Report Status PENDING  Incomplete  Urine culture     Status: None   Collection Time: 06/09/20 12:08 PM   Specimen: In/Out Cath Urine  Result Value Ref Range Status   Specimen Description   Final    IN/OUT CATH URINE Performed at [facility] [address], [place], [place] [address]    Special Requests   Final    NONE Performed at [facility], [place] [address], [place], [place] [address]    Culture   Final    NO GROWTH Performed at [facility], [place] [address] [place], [place] [address]    Report Status 06/10/2020 FINAL  Final  Blood Culture (routine x 2)     Status: None (Preliminary result)   Collection Time: 06/09/20 12:13 PM   Specimen: BLOOD  Result Value Ref Range Status   Specimen Description   Final    BLOOD LEFT ANTECUBITAL Performed at [facility] [address] [place], [place] [address]    Special Requests   Final    BOTTLES DRAWN AEROBIC AND ANAEROBIC Blood Culture adequate volume Performed at [facility] [address],  [place], [place] [address]    Culture   Final    NO GROWTH 2 DAYS Performed at [facility] [address] [place],  [address]    Report Status PENDING  Incomplete  Gastrointestinal Panel by PCR , Stool     Status: None   Collection Time: 06/09/20  7:00 PM   Specimen: Stool  Result Value Ref Range Status   Campylobacter species NOT DETECTED NOT DETECTED Final   Plesimonas shigelloides NOT DETECTED NOT DETECTED Final   Salmonella species NOT DETECTED NOT DETECTED Final   Yersinia enterocolitica NOT DETECTED NOT DETECTED Final   Vibrio species NOT DETECTED NOT DETECTED Final   Vibrio cholerae NOT DETECTED NOT DETECTED Final   Enteroaggregative E coli (EAEC) NOT DETECTED NOT DETECTED Final   Enteropathogenic E coli (EPEC) NOT DETECTED NOT DETECTED Final   Enterotoxigenic E coli (ETEC) NOT DETECTED NOT DETECTED Final   Shiga like toxin producing E coli (STEC) NOT DETECTED NOT DETECTED Final   Shigella/Enteroinvasive E coli (EIEC) NOT DETECTED NOT DETECTED Final   Cryptosporidium NOT DETECTED NOT DETECTED Final   Cyclospora cayetanensis NOT DETECTED NOT DETECTED Final   Entamoeba histolytica NOT DETECTED NOT DETECTED Final   Giardia lamblia NOT DETECTED NOT DETECTED Final   Adenovirus F40/41 NOT DETECTED NOT DETECTED Final   Astrovirus NOT DETECTED NOT DETECTED Final   Norovirus GI/GII NOT DETECTED NOT DETECTED Final   Rotavirus A NOT DETECTED NOT DETECTED Final   Sapovirus (I, II, IV, and V) NOT DETECTED NOT DETECTED Final    Comment: Performed at [facility], [place] [place], [place] [address]  C Difficile Quick Screen w PCR reflex     Status: Abnormal   Collection Time: 06/09/20  7:00 PM   Specimen: Stool  Result Value Ref Range Status   C Diff antigen POSITIVE (A) NEGATIVE Final   C Diff toxin NEGATIVE NEGATIVE Final   C Diff interpretation Results are indeterminate. See PCR results.  Final  Comment: Performed at [facility], [place] [address], [place], [place] [address]  MRSA PCR Screening     Status: None   Collection Time: 06/09/20  8:00 PM   Specimen: Nasopharyngeal  Result Value Ref Range Status   MRSA by PCR NEGATIVE NEGATIVE Final    Comment:        The GeneXpert MRSA Assay (FDA approved for NASAL specimens only), is one component of a comprehensive MRSA colonization surveillance program. It is not intended to diagnose MRSA infection nor to guide or monitor treatment for MRSA infections. Performed at [facility], [place] [address] [place], [place] [address]          Radiology Studies: No results found.      Scheduled Meds: . acyclovir  400 mg Oral BID  . apixaban  2.5 mg Oral BID  . vitamin C  1,000 mg Oral q morning  . B-complex with vitamin C  1 tablet Oral Daily  . Chlorhexidine Gluconate Cloth  6 each Topical Daily  . cholecalciferol  5,000 Units Oral q morning  . lisinopril  10 mg Oral Daily  . loratadine  10 mg Oral q morning  . pantoprazole  40 mg Oral Daily  . phosphorus  250 mg Oral BID  . vitamin B-12  3,000 mcg Oral Daily   Continuous Infusions: . sodium chloride 100 mL/hr at 06/11/20 0609  . ceFEPime (MAXIPIME) IV 2 g (06/11/20 1328)     LOS: 1 day    Time spent: 40 minutes    Irine Seal, MD [facility]   To contact the attending provider between 7A-7P or the covering provider during after hours 7P-7A, please log into the web site [email or web address] and access using universal [place] password for that web site. If you do not have the password, please call the hospital operator.  06/11/2020, 1:52 PM

## 2020-06-12 ENCOUNTER — Inpatient Hospital Stay (HOSPITAL_COMMUNITY): Payer: BC Managed Care – PPO

## 2020-06-12 DIAGNOSIS — D649 Anemia, unspecified: Secondary | ICD-10-CM | POA: Diagnosis not present

## 2020-06-12 DIAGNOSIS — R509 Fever, unspecified: Secondary | ICD-10-CM | POA: Diagnosis not present

## 2020-06-12 DIAGNOSIS — C9000 Multiple myeloma not having achieved remission: Secondary | ICD-10-CM | POA: Diagnosis not present

## 2020-06-12 LAB — RENAL FUNCTION PANEL
Albumin: 2.1 g/dL — ABNORMAL LOW (ref 3.5–5.0)
Anion gap: 8 (ref 5–15)
BUN: 11 mg/dL (ref 6–20)
CO2: 22 mmol/L (ref 22–32)
Calcium: 8 mg/dL — ABNORMAL LOW (ref 8.9–10.3)
Chloride: 106 mmol/L (ref 98–111)
Creatinine, Ser: 0.73 mg/dL (ref 0.44–1.00)
GFR, Estimated: >60 mL/min
Glucose, Bld: 93 mg/dL (ref 70–99)
Phosphorus: 2.8 mg/dL (ref 2.5–4.6)
Potassium: 4.1 mmol/L (ref 3.5–5.1)
Sodium: 136 mmol/L (ref 135–145)

## 2020-06-12 LAB — CBC WITH DIFFERENTIAL/PLATELET
Abs Immature Granulocytes: 0.78 10*3/uL — ABNORMAL HIGH (ref 0.00–0.07)
Basophils Absolute: 0 10*3/uL (ref 0.0–0.1)
Basophils Relative: 0 %
Eosinophils Absolute: 0.3 10*3/uL (ref 0.0–0.5)
Eosinophils Relative: 2 %
HCT: 25.1 % — ABNORMAL LOW (ref 36.0–46.0)
Hemoglobin: 7.6 g/dL — ABNORMAL LOW (ref 12.0–15.0)
Immature Granulocytes: 6 %
Lymphocytes Relative: 4 %
Lymphs Abs: 0.5 10*3/uL — ABNORMAL LOW (ref 0.7–4.0)
MCH: 26.8 pg (ref 26.0–34.0)
MCHC: 30.3 g/dL (ref 30.0–36.0)
MCV: 88.4 fL (ref 80.0–100.0)
Monocytes Absolute: 0.8 10*3/uL (ref 0.1–1.0)
Monocytes Relative: 6 %
Neutro Abs: 11.2 10*3/uL — ABNORMAL HIGH (ref 1.7–7.7)
Neutrophils Relative %: 82 %
Platelets: 79 10*3/uL — ABNORMAL LOW (ref 150–400)
RBC: 2.84 MIL/uL — ABNORMAL LOW (ref 3.87–5.11)
RDW: 20.2 % — ABNORMAL HIGH (ref 11.5–15.5)
WBC: 13.5 10*3/uL — ABNORMAL HIGH (ref 4.0–10.5)
nRBC: 0 % (ref 0.0–0.2)

## 2020-06-12 LAB — ABO/RH: ABO/RH(D): B POS

## 2020-06-12 LAB — PREPARE RBC (CROSSMATCH)

## 2020-06-12 LAB — PROCALCITONIN: Procalcitonin: 0.36 ng/mL

## 2020-06-12 LAB — MAGNESIUM: Magnesium: 1.8 mg/dL (ref 1.7–2.4)

## 2020-06-12 MED ORDER — IBUPROFEN 200 MG PO TABS: 400.0000 mg | ORAL_TABLET | Freq: Four times a day (QID) | ORAL | Status: DC | PRN

## 2020-06-12 MED ORDER — HEPARIN SOD (PORK) LOCK FLUSH 100 UNIT/ML IV SOLN: 500.0000 [IU] | Freq: Every day | INTRAVENOUS | Status: DC | PRN

## 2020-06-12 MED ORDER — HEPARIN SOD (PORK) LOCK FLUSH 100 UNIT/ML IV SOLN: 250.0000 [IU] | INTRAVENOUS | Status: DC | PRN

## 2020-06-12 MED ORDER — SODIUM CHLORIDE 0.9% FLUSH: 3.0000 mL | INTRAVENOUS | Status: DC | PRN

## 2020-06-12 MED ORDER — IOHEXOL 350 MG/ML SOLN
75.0000 mL | Freq: Once | INTRAVENOUS | Status: AC | PRN
  Administered 2020-06-12: 75 mL via INTRAVENOUS

## 2020-06-12 MED ORDER — SODIUM CHLORIDE 0.9% FLUSH: 10.0000 mL | INTRAVENOUS | Status: DC | PRN

## 2020-06-12 MED ORDER — SODIUM CHLORIDE 0.9% IV SOLUTION
250.0000 mL | Freq: Once | INTRAVENOUS | Status: AC
  Administered 2020-06-12: 250 mL via INTRAVENOUS

## 2020-06-12 MED ORDER — ACETAMINOPHEN 325 MG PO TABS
650.0000 mg | ORAL_TABLET | Freq: Once | ORAL | Status: AC
  Administered 2020-06-12: 650 mg via ORAL
  Filled 2020-06-12: qty 2

## 2020-06-12 MED ORDER — METHYLPREDNISOLONE SODIUM SUCC 40 MG IJ SOLR
40.0000 mg | Freq: Once | INTRAMUSCULAR | Status: AC
  Administered 2020-06-12: 40 mg via INTRAVENOUS
  Filled 2020-06-12: qty 1

## 2020-06-12 NOTE — Progress Notes (Incomplete)
HEMATOLOGY/ONCOLOGY CONSULTATION NOTE  Date of Service: 06/12/2020  Patient Care Team: [name], MD as PCP - General (Family Medicine) [name], [name], MD as PCP - Cardiology (Cardiology) [name] [name], MD as Consulting Physician (Gastroenterology)  CHIEF COMPLAINTS/PURPOSE OF CONSULTATION:  Multiple Myeloma  HISTORY OF PRESENTING ILLNESS:   Megan Velasquez is a wonderful 60 y.o. female who has been referred to [place] for evaluation and management of multiple myeloma. Pt is accompanied today by her husband for this visit.   Patient was referred to [place] for second opinion regarding management of newly diagnosed multiple myeloma by Dr. Laurena Slimmer MD.  Patient had been referred to to [facility] by her primary care physician for anemia and elevated serum protein levels.  She also had some increasing fatigue and tingling in her hands and feet.  Noted primarily in the right wrist and right ankle.  B12 levels were noted to be borderline low.  She had a work-up including labs which showed hemoglobin of 9.7 with an MCV of 86.9 WBC count of 6.95k and platelets of 165k. CMP showed a creatinine of 1.06 calcium level of 8.9 total protein of 10.2 with an albumin of 2.7 normal liver function tests and other electrolytes. Serum protein electrophoresis with an M spike of 4.3 with an additional M spike of 0.2 g/dL.  IgA quantitative more than 6400. B12 levels 276, homocystine 15.9 Serum folate 26.5 LDH 96 Beta-2 microglobulin of 2.5  Serum kappa lambda free light chains showed free kappa light chains of 181, free lambda light chains of 3.6 and a free kappa lambda ratio of 50.3.  24-hour UPEP showed no M spike and a total protein of 5.7 mg/dL.  Bone Survey completed on 03/08/2020 with results revealing "No evidence of lytic nor blastic lesions within the appendicular or axial skeleton. Degenerative changes as described above."   On review of systems, pt reports some mild fatigue.  No  focal bone pains.  No chest pain.  No shortness of breath. No evidence of GI bleeding no hematuria no nosebleeds or gum bleeds. No other acute new focal symptoms. Denies any history of known cardiopulmonary disease strokes or heart attacks.  INTERVAL HISTORY Megan Velasquez is a wonderful 60 y.o. female who is here today for evaluation and management of multiple myeloma. The patient's last visit with [place] was on 05/28/2020. The pt reports that she is doing well overall.  The pt reports ***  Lab results today 06/13/2020 of CBC w/diff and CMP is as follows: all values are WNL except for ***  On review of systems, pt reports *** and denies *** and any other symptoms.  MEDICAL HISTORY:  Past Medical History:  Diagnosis Date  . Cancer ([place])   . GERD (gastroesophageal reflux disease)   . History of kidney stones   . HTN (hypertension)   . Renal disorder     SURGICAL HISTORY: Past Surgical History:  Procedure Laterality Date  . CHOLECYSTECTOMY    . COLONOSCOPY  01/2015   Dr. Britta Mccreedy: Mild diverticulosis, sessile polyp ranging 3 to 5 mm removed from the proximal transverse colon, semi-pedunculated polyp 5 to 9 mm in size removed from the sigmoid colon.  Sigmoid colon polyp was serrated adenoma, transverse colon polyp was adenomatous.  Patient was told to have another colonoscopy in 3 years.  . COLONOSCOPY WITH PROPOFOL N/A 10/11/2017   Procedure: COLONOSCOPY WITH PROPOFOL;  Surgeon: Daneil Dolin, MD;  Location: [facility];  Service: Endoscopy;  Laterality: N/A;  1:15pm  . IR IMAGING GUIDED PORT INSERTION  06/05/2020  . POLYPECTOMY  10/11/2017   Procedure: POLYPECTOMY;  Surgeon: Daneil Dolin, MD;  Location: [facility];  Service: Endoscopy;;  descending colon polyps cs times 2    SOCIAL HISTORY: Social History   Socioeconomic History  . Marital status: Married    Spouse name: Not on file  . Number of children: Not on file  . Years of education: Not on file  . Highest education  level: Not on file  Occupational History  . Not on file  Tobacco Use  . Smoking status: Former Smoker    Packs/day: 1.00    Years: 28.00    Pack years: 28.00    Types: Cigarettes    Quit date: 10/08/2013    Years since quitting: 6.6  . Smokeless tobacco: Never Used  Vaping Use  . Vaping Use: Never used  Substance and Sexual Activity  . Alcohol use: Not Currently  . Drug use: Never  . Sexual activity: Yes    Birth control/protection: Post-menopausal  Other Topics Concern  . Not on file  Social History Narrative  . Not on file   Social Determinants of Health   Financial Resource Strain: Low Risk   . Difficulty of Paying Living Expenses: Not hard at all  Food Insecurity: No Food Insecurity  . Worried About Charity fundraiser in the Last Year: Never true  . Ran Out of Food in the Last Year: Never true  Transportation Needs: No Transportation Needs  . Lack of Transportation (Medical): No  . Lack of Transportation (Non-Medical): No  Physical Activity: Not on file  Stress: No Stress Concern Present  . Feeling of Stress : Only a little  Social Connections: Unknown  . Frequency of Communication with Friends and Family: More than three times a week  . Frequency of Social Gatherings with Friends and Family: More than three times a week  . Attends Religious Services: Not on file  . Active Member of Clubs or Organizations: Not on file  . Attends Archivist Meetings: Not on file  . Marital Status: Married  Human resources officer Violence: Not on file    FAMILY HISTORY: Family History  Problem Relation Age of Onset  . Heart disease Mother   . Diabetes Mother   . Lung cancer Father   . COPD Father   . Colon cancer Neg Hx     ALLERGIES:  has No Known Allergies.  MEDICATIONS:  No current facility-administered medications for this visit.   No current outpatient medications on file.   Facility-Administered Medications Ordered in Other Visits  Medication Dose Route  Frequency Provider Last Rate Last Admin  . 0.9 %  sodium chloride infusion   Intravenous Continuous Marylyn Ishihara, Tyrone A, DO 100 mL/hr at 06/11/20 2200 New Bag at 06/11/20 2200  . acetaminophen (TYLENOL) tablet 650 mg  650 mg Oral Q6H PRN Cherylann Ratel A, DO   650 mg at 06/11/20 2112   Or  . acetaminophen (TYLENOL) suppository 650 mg  650 mg Rectal Q6H PRN Cherylann Ratel A, DO      . acyclovir (ZOVIRAX) tablet 400 mg  400 mg Oral BID Marylyn Ishihara, Tyrone A, DO   400 mg at 06/12/20 0830  . apixaban (ELIQUIS) tablet 2.5 mg  2.5 mg Oral BID Marylyn Ishihara, Tyrone A, DO   2.5 mg at 06/12/20 0829  . ascorbic acid (VITAMIN C) tablet 1,000 mg  1,000 mg Oral q morning  Cherylann Ratel A, DO   1,000 mg at 06/12/20 0839  . B-complex with vitamin C tablet 1 tablet  1 tablet Oral Daily Kyle, Tyrone A, DO   1 tablet at 06/12/20 0835  . ceFEPIme (MAXIPIME) 2 g in sodium chloride 0.9 % 100 mL IVPB  2 g Intravenous Q8H Wofford, Drew A, RPH 200 mL/hr at 06/12/20 0539 2 g at 06/12/20 0539  . Chlorhexidine Gluconate Cloth 2 % PADS 6 each  6 each Topical Daily Eugenie Filler, MD   6 each at 06/12/20 [phone]  . cholecalciferol (VITAMIN D3) tablet 5,000 Units  5,000 Units Oral q morning Marylyn Ishihara, Tyrone A, DO   5,000 Units at 06/12/20 0839  . hydrALAZINE (APRESOLINE) injection 10 mg  10 mg Intravenous Q8H PRN Marylyn Ishihara, Tyrone A, DO      . HYDROcodone-acetaminophen (NORCO/VICODIN) 5-325 MG per tablet 1-2 tablet  1-2 tablet Oral Q4H PRN Marylyn Ishihara, Tyrone A, DO      . hydrOXYzine (ATARAX/VISTARIL) tablet 25 mg  25 mg Oral TID PRN Eugenie Filler, MD   25 mg at 06/12/20 0829  . lisinopril (ZESTRIL) tablet 10 mg  10 mg Oral Daily Kyle, Tyrone A, DO   10 mg at 06/12/20 0829  . loratadine (CLARITIN) tablet 10 mg  10 mg Oral q morning Kyle, Tyrone A, DO   10 mg at 06/12/20 0839  . ondansetron (ZOFRAN) tablet 4 mg  4 mg Oral Q6H PRN Marylyn Ishihara, Tyrone A, DO       Or  . ondansetron (ZOFRAN) injection 4 mg  4 mg Intravenous Q6H PRN Marylyn Ishihara, Tyrone A, DO      . pantoprazole  (PROTONIX) EC tablet 40 mg  40 mg Oral Daily Eugenie Filler, MD   40 mg at 06/12/20 0829  . phosphorus (K PHOS NEUTRAL) tablet 250 mg  250 mg Oral BID Eugenie Filler, MD   250 mg at 06/12/20 0835  . vitamin B-12 (CYANOCOBALAMIN) tablet 3,000 mcg  3,000 mcg Oral Daily Kyle, Tyrone A, DO   3,000 mcg at 06/12/20 6203    REVIEW OF SYSTEMS:   10 Point review of Systems was done is negative except as noted above.  PHYSICAL EXAMINATION: ECOG PERFORMANCE STATUS: 1 - Symptomatic but completely ambulatory  . There were no vitals filed for this visit. There were no vitals filed for this visit. .There is no height or weight on file to calculate BMI.   *** GENERAL:alert, in no acute distress and comfortable SKIN: no acute rashes, no significant lesions EYES: conjunctiva are pink and non-injected, sclera anicteric OROPHARYNX: MMM, no exudates, no oropharyngeal erythema or ulceration NECK: supple, no JVD LYMPH:  no palpable lymphadenopathy in the cervical, axillary or inguinal regions LUNGS: clear to auscultation b/l with normal respiratory effort HEART: regular rate & rhythm ABDOMEN:  normoactive bowel sounds , non tender, not distended. Extremity: no pedal edema PSYCH: alert & oriented x 3 with fluent speech NEURO: no focal motor/sensory deficits  LABORATORY DATA:  I have reviewed the data as listed  . CBC Latest Ref Rng & Units 06/12/2020 06/11/2020 06/10/2020  WBC 4.0 - 10.5 K/uL 13.5(H) 18.1(H) 15.0(H)  Hemoglobin 12.0 - 15.0 g/dL 7.6(L) 8.2(L) 8.6(L)  Hematocrit 36.0 - 46.0 % 25.1(L) 26.2(L) 27.2(L)  Platelets 150 - 400 K/uL 79(L) 85(L) 103(L)    . CMP Latest Ref Rng & Units 06/12/2020 06/11/2020 06/10/2020  Glucose 70 - 99 mg/dL 93 103(H) 143(H)  BUN 6 - 20 mg/dL 11 10 12   Creatinine  0.44 - 1.00 mg/dL 0.73 0.73 0.87  Sodium 135 - 145 mmol/L 136 134(L) 139  Potassium 3.5 - 5.1 mmol/L 4.1 4.4 3.1(L)  Chloride 98 - 111 mmol/L 106 104 104  CO2 22 - 32 mmol/L 22 22 22   Calcium 8.9 -  10.3 mg/dL 8.0(L) 7.9(L) 7.9(L)  Total Protein 6.5 - 8.1 g/dL - - 7.3  Total Bilirubin 0.3 - 1.2 mg/dL - - 0.9  Alkaline Phos 38 - 126 U/L - - 34(L)  AST 15 - 41 U/L - - 16  ALT 0 - 44 U/L - - 16     Most recent lab results (03/15/2020) of CBC is as follows: all values are WNL except for RBC at 3.66, Hgb at 9.7, HCT at 31.8, MCHC at 30.5, RDW Standard Deviation 58.7, Red Cell Distribution Width At 18.6,  BUN at 19, Creatinine at 1.06, Total Protein at 10.2, Albumin at 2.7, AST at 14,. 03/13/2020 UPEP shows no M-Spike, all values are WNL 03/06/2020 SPEP shows Total Protein at 10.1, Beta Globulin at 5.2, M-Spike at 4.3, Total Globulin at 6.4, A/G Ratio at 0.6 03/06/2020 Free Kappa Light Chains at 181.1, Free Lambda Light Chains Serum at 3.6, K/L ratio at 50.31 03/06/2020 Beta-2 Microglobulin at 2.5  Surgical Pathology   THIS IS AN ADDENDUM REPORT   CASE: [id number]  PATIENT: Megan Velasquez  Bone Marrow Report  Addendum    Reason for Addendum #1: Molecular Genetic Test Results, FISH  Reason for Addendum #2: Cytogenetics results   Clinical History: Multiple myeloma, assess for marrow involvement      DIAGNOSIS:   BONE MARROW, ASPIRATE, CLOT, CORE:  - Kappa-restricted plasma cell myeloma involving a mildly hypercellular  bone marrow with decreased trilineage hematopoiesis  - See comment   PERIPHERAL BLOOD:  - Normocytic anemia with rouleaux formation    COMMENT:   Plasma cells are increased on aspirate smears (19% by manual  differential count) and show atypical morphology. CD138  immunohistochemistry on the clot and core biopsy highlights a diffuse  increase in plasma cells comprising 60-70% of total marrow cellularity.  The majority of plasma cells show aberrant CD56 expression by  immunohistochemistry; a small subset of plasma cells aberrantly  expresses CD20. Plasma cells are kappa-restricted by light chain in situ  hybridization. Together, the findings support  the diagnosis of marrow  involvement by plasma cell myeloma. Correlation with clinical findings,  radiographic studies, other laboratory data, and cytogenetics/FISH  results is recommended.   RADIOGRAPHIC STUDIES: I have personally reviewed the radiological images as listed and agreed with the findings in the report. NM PET Image Initial (PI) Whole Body  Result Date: 05/17/2020 CLINICAL DATA:  Initial treatment strategy for multiple myeloma. EXAM: NUCLEAR MEDICINE PET WHOLE BODY TECHNIQUE: 7.0 mCi F-18 FDG was injected intravenously. Full-ring PET imaging was performed from the head to foot after the radiotracer. CT data was obtained and used for attenuation correction and anatomic localization. Fasting blood glucose: 98 mg/dl COMPARISON:  Multiple exams, including CT chest report from 01/23/2019 FINDINGS: Mediastinal blood pool activity: SUV max 2.4 HEAD/NECK: No significant abnormal hypermetabolic activity in this region. Incidental CT findings: Chronic right maxillary sinusitis. Bilateral common carotid atherosclerotic calcification. CHEST: Sharply defined 3.1 by 1.2 cm ground-glass density lesion in the left upper lobe on image 93 of series 4 is hypermetabolic with maximum SUV 11.4. A smaller focus of subpleural ground-glass opacity in the left upper lobe measuring 1.3 by 0.8 cm on image 103 of series 4 has a maximum  SUV of 3.1. 0.6 by 0.4 cm left lower lobe nodule on image 115 of series 4, without appreciable accentuated metabolic activity. Subpleural ground-glass nodularity in the left lower lobe measuring 0.9 by 0.6 cm without hypermetabolic activity on image 35 series 8. Mildly focally accentuated distal esophageal activity with maximum SUV 4.3. This is most commonly physiologic at this location. Incidental CT findings: Atherosclerotic calcification of the aortic arch and branch vessels. ABDOMEN/PELVIS: No significant abnormal hypermetabolic activity in this region. Incidental CT findings:  Aortoiliac atherosclerotic vascular disease. Cholecystectomy. Sigmoid colon diverticulosis. Pelvic floor laxity with cystocele. SKELETON: Diffusely accentuated marrow activity throughout the axial skeleton and in much of the proximal appendicular skeleton, without focal lesions identified and without typical punched-out cortical lesions often encountered in the setting of myeloma. The appearance on today's exam could be from granulocyte stimulation (correlate with any history of minute stray shin of granulocyte stimulating pharmaceuticals) or CT occult marrow infiltration. Incidental CT findings: Mild degenerative anterolisthesis of L5 on S1. EXTREMITIES: No significant abnormal hypermetabolic activity in this region. Incidental CT findings: Subcutaneous edema along the heels. IMPRESSION: 1. A 03.1 by 1.2 cm ground-glass density in the left upper lobe is notably hypermetabolic with maximum SUV 11.4, and a smaller focus of subpleural ground-glass opacity in the left upper lobe is mildly hypermetabolic. Possibilities may include lepidic growth lung cancer or atypical infectious process. Tissue sampling may be warranted. 2. Generalized accentuated marrow activity in the axial skeleton and proximal appendicular skeleton, symmetric and without focal lesions on the CT data. This could be from granulocyte stimulation (correlate with any medication aimed at granulocyte stimulation) or CT occult marrow infiltrative process. 3. Other imaging findings of potential clinical significance: Chronic right maxillary sinusitis. Aortic Atherosclerosis (ICD10-I70.0). Sigmoid colon diverticulosis. Pelvic floor laxity with cystocele. Electronically Signed   By: Van Clines M.D.   On: 05/17/2020 13:07   CT Biopsy  Result Date: 05/20/2020 INDICATION: 60 year old female with new diagnosis of multiple myeloma. Evaluate marrow involvement for staging. EXAM: CT GUIDED BONE MARROW ASPIRATION AND CORE BIOPSY Interventional  Radiologist:  Criselda Peaches, MD MEDICATIONS: None. ANESTHESIA/SEDATION: Moderate (conscious) sedation was employed during this procedure. A total of 2 milligrams versed and 100 micrograms fentanyl were administered intravenously. The patient's level of consciousness and vital signs were monitored continuously by radiology nursing throughout the procedure under my direct supervision. Total monitored sedation time: 10 minutes FLUOROSCOPY TIME:  None. COMPLICATIONS: None immediate. Estimated blood loss: <25 mL PROCEDURE: Informed written consent was obtained from the patient after a thorough discussion of the procedural risks, benefits and alternatives. All questions were addressed. Maximal Sterile Barrier Technique was utilized including caps, mask, sterile gowns, sterile gloves, sterile drape, hand hygiene and skin antiseptic. A timeout was performed prior to the initiation of the procedure. The patient was positioned prone and non-contrast localization CT was performed of the pelvis to demonstrate the iliac marrow spaces. Maximal barrier sterile technique utilized including caps, mask, sterile gowns, sterile gloves, large sterile drape, hand hygiene, and betadine prep. Under sterile conditions and local anesthesia, an 11 gauge coaxial bone biopsy needle was advanced into the right iliac marrow space. Needle position was confirmed with CT imaging. Initially, bone marrow aspiration was performed. Next, the 11 gauge outer cannula was utilized to obtain a right iliac bone marrow core biopsy. Needle was removed. Hemostasis was obtained with compression. The patient tolerated the procedure well. Samples were prepared with the cytotechnologist. IMPRESSION: Technically successful CT-guided bone marrow aspiration and core biopsy of the right  iliac bone. Electronically Signed   By: Jacqulynn Cadet M.D.   On: 05/20/2020 12:35   DG Chest Port 1 View  Result Date: 06/09/2020 CLINICAL DATA:  Fever and shortness of  breath. EXAM: PORTABLE CHEST 1 VIEW COMPARISON:  None. FINDINGS: The cardiomediastinal silhouette is unremarkable. A RIGHT Port-A-Cath is noted with tip overlying the LOWER SVC. There is no evidence of focal airspace disease, pulmonary edema, suspicious pulmonary nodule/mass, pleural effusion, or pneumothorax. No acute bony abnormalities are identified. IMPRESSION: No active disease. Electronically Signed   By: Margarette Canada M.D.   On: 06/09/2020 13:03   CT BONE MARROW BIOPSY & ASPIRATION  Result Date: 05/20/2020 INDICATION: 60 year old female with new diagnosis of multiple myeloma. Evaluate marrow involvement for staging. EXAM: CT GUIDED BONE MARROW ASPIRATION AND CORE BIOPSY Interventional Radiologist:  Criselda Peaches, MD MEDICATIONS: None. ANESTHESIA/SEDATION: Moderate (conscious) sedation was employed during this procedure. A total of 2 milligrams versed and 100 micrograms fentanyl were administered intravenously. The patient's level of consciousness and vital signs were monitored continuously by radiology nursing throughout the procedure under my direct supervision. Total monitored sedation time: 10 minutes FLUOROSCOPY TIME:  None. COMPLICATIONS: None immediate. Estimated blood loss: <25 mL PROCEDURE: Informed written consent was obtained from the patient after a thorough discussion of the procedural risks, benefits and alternatives. All questions were addressed. Maximal Sterile Barrier Technique was utilized including caps, mask, sterile gowns, sterile gloves, sterile drape, hand hygiene and skin antiseptic. A timeout was performed prior to the initiation of the procedure. The patient was positioned prone and non-contrast localization CT was performed of the pelvis to demonstrate the iliac marrow spaces. Maximal barrier sterile technique utilized including caps, mask, sterile gowns, sterile gloves, large sterile drape, hand hygiene, and betadine prep. Under sterile conditions and local anesthesia, an 11  gauge coaxial bone biopsy needle was advanced into the right iliac marrow space. Needle position was confirmed with CT imaging. Initially, bone marrow aspiration was performed. Next, the 11 gauge outer cannula was utilized to obtain a right iliac bone marrow core biopsy. Needle was removed. Hemostasis was obtained with compression. The patient tolerated the procedure well. Samples were prepared with the cytotechnologist. IMPRESSION: Technically successful CT-guided bone marrow aspiration and core biopsy of the right iliac bone. Electronically Signed   By: Jacqulynn Cadet M.D.   On: 05/20/2020 12:35   IR IMAGING GUIDED PORT INSERTION  Result Date: 06/05/2020 INDICATION: 60 year old female with history of multiple myeloma requiring central venous access for chemotherapy. EXAM: IMPLANTED PORT A CATH PLACEMENT WITH ULTRASOUND AND FLUOROSCOPIC GUIDANCE COMPARISON:  None. MEDICATIONS: None. ANESTHESIA/SEDATION: Moderate (conscious) sedation was employed during this procedure. A total of Versed 4 mg and Fentanyl 100 mcg was administered intravenously. Moderate Sedation Time: 15 minutes. The patient's level of consciousness and vital signs were monitored continuously by radiology nursing throughout the procedure under my direct supervision. CONTRAST:  None FLUOROSCOPY TIME:  0 minutes, 6 seconds (1 mGy) COMPLICATIONS: None immediate. PROCEDURE: The procedure, risks, benefits, and alternatives were explained to the patient. Questions regarding the procedure were encouraged and answered. The patient understands and consents to the procedure. The right neck and chest were prepped with chlorhexidine in a sterile fashion, and a sterile drape was applied covering the operative field. Maximum barrier sterile technique with sterile gowns and gloves were used for the procedure. A timeout was performed prior to the initiation of the procedure. Ultrasound was used to examine the jugular vein which was compressible and free of  internal echoes.  A skin marker was used to demarcate the planned venotomy and port pocket incision sites. Local anesthesia was provided to these sites and the subcutaneous tunnel track with 1% lidocaine with 1:100,000 epinephrine. A small incision was created at the jugular access site and blunt dissection was performed of the subcutaneous tissues. Under real time ultrasound guidance, the jugular vein was accessed with a 21 ga micropuncture needle and an 0.018" wire was inserted to the superior vena cava. A 5 Fr micopuncture set was then used, through which a 0.035" Rosen wire was passed under fluoroscopic guidance into the inferior vena cava. An 8 Fr dilator was then placed over the wire. A subcutaneous port pocket was then created along the upper chest wall utilizing a combination of sharp and blunt dissection. The pocket was irrigated with sterile saline, packed with gauze, and observed for hemorrhage. A single lumen "ISP" sized power injectable port was chosen for placement. The 8 Fr catheter was tunneled from the port pocket site to the venotomy incision. The port was placed in the pocket. The external catheter was trimmed to appropriate length. The dilator was exchanged for an 8 Fr peel-away sheath under fluoroscopic guidance. The catheter was then placed through the sheath and the sheath was removed. Final catheter positioning was confirmed and documented with a fluoroscopic spot radiograph. The port was accessed with a Huber needle, aspirated, and flushed with heparinized saline. The deep dermal layer of the port pocket incision was closed with interrupted 3-0 Vicryl suture. The skin was opposed with a running subcuticular 4-0 Monocryl suture. Dermabond was then placed over the port pocket and neck incisions. The patient tolerated the procedure well without immediate post procedural complication. FINDINGS: After catheter placement, the tip lies within the cavoatrial junction the catheter aspirates and  flushes normally and is ready for immediate use. IMPRESSION: Successful placement of a power injectable Port-A-Cath via the right internal jugular vein. The catheter is ready for immediate use. Ruthann Cancer, MD [facility] Rad[facility] Electronically Signed   By: Ruthann Cancer MD   On: 06/05/2020 15:51    ASSESSMENT & PLAN:   60 year old female with  #1 Newly diagnosed multiple myeloma  RISS Stage II, IgA kappa Presenting with anemia hemoglobin today 9.4. Creatinine 1.28 No hypercalcemia Bone survey with no obvious skeletal lesions.  Baseline M spike of 4.7 g/dL.+ additional spike of 0.3g/dl Beta-2 Microglobulin at 2.5 Albumin 2 Bone marrow biopsy-showing 60 to 70% abnormal plasma cells which are kappa restricted consistent with multiple myeloma. Molecular cytogenetics-complex karyotype would duplication 1 q., monosomy 13, deletions of 14 q. and 16 q.  #2 anemia likely related to multiple myeloma.  Ferritin adequate B12 levels low normal.  #3 renal insufficiency creatinine 1.28 likely some element of renal insufficiency for myeloma.  PLAN: -Discussed pt labwork today, 06/13/2020; ***   -We will start Revlimid at 15 mg 3 weeks on 1 week off and advance dose if tolerated. -We will need to be in supportive acyclovir 400 mg p.o. twice daily for shingles prophylaxis -Need to be on aspirin 81 mg p.o. daily for VTE prophylaxis with Revlimid. -Will see back ***   FOLLOW UP: ***    All of the patients questions were answered with apparent satisfaction. The patient knows to call the clinic with any problems, questions or concerns.  The total time spent in the appointment was *** minutes and more than 50% was on counseling and direct patient cares.    Sullivan Lone MD MS  AAHIVMS [facility] Hematology/Oncology Physician [place]  (Office):       [phone] (Work cell):  [phone] (Fax):            [phone]  06/12/2020 11:17 AM  I, Reinaldo Raddle, am acting as scribe for Dr. Sullivan Lone, MD.

## 2020-06-12 NOTE — Progress Notes (Signed)
PROGRESS NOTE    Megan Velasquez  PPJ:[id number] DOB: [birth year] DOA: 06/09/2020 PCP: [name], MD   Chief Complaint  Patient presents with  . chemo card  . Generalized Body Aches  . Fever  . Shortness of Breath   Brief Narrative: 60 year old female with history of multiple myeloma, GERD, hypertension, Port-A-Cath placed 4 days prior to admission and placed on chemo last Thursday and Friday subsequently developed fever malaise fatigue and admitted for fever of unknown origin.  Chest x-ray unremarkable UA unremarkable placed on IV antibiotics and admitted.  Patient has had diarrhea-C. difficile antigen positive but toxin negative, GI panel negative. Covid negative blood culture from 3/6 - so far  Subjective: tmax 100.9 last night C/p SOB/DOE with minimal activity and this is her main concern- wants CT w/ contrast Anxiety meds not helping much' No chest pain or leg swelling plt at 79 k, hb is 7.6gm Wbc 18k> 13k having diarrhea after eating, on abdomen pain  Assessment & Plan: FUO :Chest x-ray unremarkable UA unremarkable placed on IV antibiotics and admitted. Covid negative blood culture from 3/6 - so far.  Hematology oncology following, fever likely from her recent chemotherapy.  Port-A-Cath site looks clean no signs of infection.  Oncology following, remains on IV cefepime, supportive care.  Had fever last night due to ongoing fever and having significant shortness of breath obttain CTA chest. Monitor  C. difficile antigen positive but toxin negative, pharmacy spoke w/ labs and C diff PCR was +-patient symptomatic having diarrhea. Favor treating/hihg risk on chemo. GI panel negative.  Shortness of breath/dyspnea with minimal activity: Patient has mild pleuritic component, she is concerned about blood clot,has had fever.  Reports anxiety medics not helping much. Patient is requesting CT scan with IV contrast. renal function stable. See above.  Dehydration: HCTZ discontinued.   Encourage oral intake  Hypokalemia/hypomagnesemia and hypophosphatemia: From GI loss and diarrhea, hctz.  Resolved.  HCTZ has been stopped.  Anemia hemoglobin downtrending 7.6 iron shows high ferritin, iron level 13, P80 folic acid normal, iron supplementation upon discharge.  Defer IV iron to hematology Recent Labs  Lab 06/06/20 0945 06/09/20 1208 06/10/20 0957 06/11/20 0537 06/12/20 0528  HGB 9.1* 9.9* 8.6* 8.2* 7.6*  HCT 28.9* 31.1* 27.2* 26.2* 25.1*   Multiple myeloma not having achieved remission:On induction therapy with carfilzomib/Revlimid/dexamethasone She is also on aspirin and Eliquis due to Revlimid.On prophylactic Acyclovir since being on chemo.  Oncology following on board.  GERD: Continue PPI  Essential hypertension: BP currently stable on lisinopril.  HCTZ discontinued due to electrolyte imbalance  Diet Order            Diet regular Room service appropriate? Yes; Fluid consistency: Thin  Diet effective now                 Patient's Body mass index is 23.26 kg/m.  DVT prophylaxis: apixaban (ELIQUIS) tablet 2.5 mg Start: 06/09/20 2200 Code Status:   Code Status: Full Code  Family Communication: plan of care discussed with patient at bedside.  Status is: Inpatient Remains inpatient appropriate because:Ongoing diagnostic testing needed not appropriate for outpatient work up, IV treatments appropriate due to intensity of illness or inability to take PO and Inpatient level of care appropriate due to severity of illness  Dispo: The patient is from: Home              Anticipated d/c is to: Home              Patient  currently is not medically stable to d/c.   Difficult to place patient No  Unresulted Labs (From admission, onward)         None      Medications reviewed:  Scheduled Meds: . acyclovir  400 mg Oral BID  . apixaban  2.5 mg Oral BID  . vitamin C  1,000 mg Oral q morning  . B-complex with vitamin C  1 tablet Oral Daily  . Chlorhexidine  Gluconate Cloth  6 each Topical Daily  . cholecalciferol  5,000 Units Oral q morning  . lisinopril  10 mg Oral Daily  . loratadine  10 mg Oral q morning  . pantoprazole  40 mg Oral Daily  . phosphorus  250 mg Oral BID  . vitamin B-12  3,000 mcg Oral Daily   Continuous Infusions: . sodium chloride 100 mL/hr at 06/11/20 2200  . ceFEPime (MAXIPIME) IV 2 g (06/12/20 0539)    Consultants:see note  Procedures:see note  Antimicrobials: Anti-infectives (From admission, onward)   Start     Dose/Rate Route Frequency Ordered Stop   06/09/20 2200  acyclovir (ZOVIRAX) tablet 400 mg        400 mg Oral 2 times daily 06/09/20 1546     06/09/20 2200  ceFEPIme (MAXIPIME) 2 g in sodium chloride 0.9 % 100 mL IVPB        2 g 200 mL/hr over 30 Minutes Intravenous Every 8 hours 06/09/20 1621     06/09/20 1300  vancomycin (VANCOCIN) IVPB 1000 mg/200 mL premix        1,000 mg 200 mL/hr over 60 Minutes Intravenous  Once 06/09/20 1219 06/09/20 1444   06/09/20 1230  ceFEPIme (MAXIPIME) 2 g in sodium chloride 0.9 % 100 mL IVPB        2 g 200 mL/hr over 30 Minutes Intravenous  Once 06/09/20 1219 06/09/20 1328     Culture/Microbiology    Component Value Date/Time   SDES  06/09/2020 1213    BLOOD LEFT ANTECUBITAL Performed at [facility], [place] [address], [place], [place] [address]    [place]  06/09/2020 1213    BOTTLES DRAWN AEROBIC AND ANAEROBIC Blood Culture adequate volume Performed at [facility], [place] [address] [place], [place] [address]    CULT  06/09/2020 1213    NO GROWTH 2 DAYS Performed at [facility] [address] [place], [place] [address]    REPTSTATUS PENDING 06/09/2020 1213    Other culture-see note  Objective: Vitals: Today's Vitals   06/11/20 2157 06/11/20 2209 06/12/20 0012 06/12/20 0438  BP:  (!) 137/55 (!) 106/54 123/68  Pulse:  92 75 85  Resp:  16 16   Temp:  98.5 F (36.9 C) 98.1 F (36.7 C) 98.7 F (37.1 C)   TempSrc:  Oral Oral Oral  SpO2:  97% 100% 100%  Weight:      Height:      PainSc: 0-No pain      No intake or output data in the 24 hours ending 06/12/20 0944 Filed Weights   06/09/20 1205  Weight: 63.4 kg   Weight change:   Intake/Output from previous day: 03/08 0701 - 03/09 0700 In: -  Out: 200 [Urine:200] Intake/Output this shift: No intake/output data recorded. Filed Weights   06/09/20 1205  Weight: 63.4 kg    Examination:  General exam: AAOX3,NAD, weak appearing. HEENT:Oral mucosa moist, Ear/Nose WNL grossly,dentition normal. Respiratory system: bilaterally diminished,no use of accessory muscle, non tender.  Cardiovascular system: S1 & S2 +, regular, No JVD. Gastrointestinal system: Abdomen soft, NT,ND, BS+. Nervous System:Alert, awake, moving extremities and grossly nonfocal Extremities: No edema, distal peripheral pulses palpable.  Skin: No rashes,no icterus. MSK: Normal muscle bulk,tone, power  Data Reviewed: I have personally reviewed following labs and imaging studies CBC: Recent Labs  Lab 06/06/20 0945 06/09/20 1208 06/10/20 0957 06/11/20 0537 06/12/20 0528  WBC 7.5 14.1* 15.0* 18.1* 13.5*  NEUTROABS 4.7 11.7* 11.9* 14.8* 11.2*  HGB 9.1* 9.9* 8.6* 8.2* 7.6*  HCT 28.9* 31.1* 27.2* 26.2* 25.1*  MCV 85.0 85.4 85.5 87.3 88.4  PLT 163 142* 103* 85* 79*   Basic Metabolic Panel: Recent Labs  Lab 06/06/20 0945 06/09/20 1208 06/10/20 0957 06/11/20 0537 06/12/20 0528  NA 137 135 139 134* 136  K 3.8 3.5 3.1* 4.4 4.1  CL 103 96* 104 104 106  CO2 24 25 22 22 22   GLUCOSE 126* 112* 143* 103* 93  BUN 16 16 12 10 11   CREATININE 0.98 0.81 0.87 0.73 0.73  CALCIUM 8.3* 8.8* 7.9* 7.9* 8.0*  MG  --   --  1.3* 2.0 1.8  PHOS  --   --   --  2.0* 2.8   GFR: Estimated Creatinine Clearance: 67.3 mL/min (by C-G formula based on SCr of 0.73 mg/dL). Liver Function Tests: Recent Labs  Lab 06/06/20 0945 06/09/20 1208 06/10/20 0957 06/11/20 0537 06/12/20 0528   AST 12* 16 16  --   --   ALT 9 15 16   --   --   ALKPHOS 54 39 34*  --   --   BILITOT 0.3 1.1 0.9  --   --   PROT 9.4* 8.8* 7.3  --   --   ALBUMIN 2.8* 3.0* 2.3* 2.3* 2.1*   No results for input(s): LIPASE, AMYLASE in the last 168 hours. No results for input(s): AMMONIA in the last 168 hours. Coagulation Profile: Recent Labs  Lab 06/09/20 1208  INR 1.4*   Cardiac Enzymes: No results for input(s): CKTOTAL, CKMB, CKMBINDEX, TROPONINI in the last 168 hours. BNP (last 3 results) No results for input(s): PROBNP in the last 8760 hours. HbA1C: No results for input(s): HGBA1C in the last 72 hours. CBG: No results for input(s): GLUCAP in the last 168 hours. Lipid Profile: No results for input(s): CHOL, HDL, LDLCALC, TRIG, CHOLHDL, LDLDIRECT in the last 72 hours. Thyroid Function Tests: No results for input(s): TSH, T4TOTAL, FREET4, T3FREE, THYROIDAB in the last 72 hours. Anemia Panel: Recent Labs    06/11/20 0537  VITAMINB12 888  FOLATE 30.5  FERRITIN 1,397*  TIBC 162*  IRON 13*   Sepsis Labs: Recent Labs  Lab 06/09/20 1208  LATICACIDVEN 1.2    Recent Results (from the past 240 hour(s))  Resp Panel by RT-PCR (Flu A&B, Covid) Nasopharyngeal Swab     Status: None   Collection Time: 06/09/20 12:08 PM   Specimen: Nasopharyngeal Swab; Nasopharyngeal(NP) swabs in vial transport medium  Result Value Ref Range Status   SARS Coronavirus 2 by RT PCR NEGATIVE NEGATIVE Final    Comment: (NOTE) SARS-CoV-2 target nucleic acids are NOT DETECTED.  The SARS-CoV-2 RNA is generally detectable in upper respiratory specimens during the acute phase of infection. The lowest concentration of SARS-CoV-2 viral copies this assay can detect is 138 copies/mL. A negative result does not preclude SARS-Cov-2 infection and should not be used as the sole basis for treatment or other patient management decisions. A negative result may occur with  improper specimen  collection/handling, submission of  specimen other than nasopharyngeal swab, presence of viral mutation(s) within the areas targeted by this assay, and inadequate number of viral copies(<138 copies/mL). A negative result must be combined with clinical observations, patient history, and epidemiological information. The expected result is Negative.  Fact Sheet for Patients:  EntrepreneurPulse.com.au  Fact Sheet for Healthcare Providers:  IncredibleEmployment.be  This test is no t yet approved or cleared by the [place] FDA and  has been authorized for detection and/or diagnosis of SARS-CoV-2 by FDA under an Emergency Use Authorization (EUA). This EUA will remain  in effect (meaning this test can be used) for the duration of the COVID-19 declaration under Section 564(b)(1) of the Act, 21 U.S.C.section 360bbb-3(b)(1), unless the authorization is terminated  or revoked sooner.       Influenza A by PCR NEGATIVE NEGATIVE Final   Influenza B by PCR NEGATIVE NEGATIVE Final    Comment: (NOTE) The Xpert Xpress SARS-CoV-2/FLU/RSV plus assay is intended as an aid in the diagnosis of influenza from Nasopharyngeal swab specimens and should not be used as a sole basis for treatment. Nasal washings and aspirates are unacceptable for Xpert Xpress SARS-CoV-2/FLU/RSV testing.  Fact Sheet for Patients: EntrepreneurPulse.com.au  Fact Sheet for Healthcare Providers: IncredibleEmployment.be  This test is not yet approved or cleared by the [place] FDA and has been authorized for detection and/or diagnosis of SARS-CoV-2 by FDA under an Emergency Use Authorization (EUA). This EUA will remain in effect (meaning this test can be used) for the duration of the COVID-19 declaration under Section 564(b)(1) of the Act, 21 U.S.C. section 360bbb-3(b)(1), unless the authorization is terminated or revoked.  Performed at [facility], [place]  [address], [place], [place] [address]   Blood Culture (routine x 2)     Status: None (Preliminary result)   Collection Time: 06/09/20 12:08 PM   Specimen: BLOOD  Result Value Ref Range Status   Specimen Description   Final    BLOOD BLOOD LEFT HAND Performed at [facility] [address] [place], [place] [address]    Special Requests   Final    BOTTLES DRAWN AEROBIC AND ANAEROBIC Blood Culture adequate volume Performed at [facility] [address] [place], [place] [address]    Culture   Final    NO GROWTH 2 DAYS Performed at [facility] [address], [place], [place] [address]    Report Status PENDING  Incomplete  Urine culture     Status: None   Collection Time: 06/09/20 12:08 PM   Specimen: In/Out Cath Urine  Result Value Ref Range Status   Specimen Description   Final    IN/OUT CATH URINE Performed at [facility] [address], [place], [place] [address]    Special Requests   Final    NONE Performed at [facility], [place] [address], [place], [place] [address]    Culture   Final    NO GROWTH Performed at [facility], [place] [address] [place], [place] [address]    Report Status 06/10/2020 FINAL  Final  Blood Culture (routine x 2)     Status: None (Preliminary result)   Collection Time: 06/09/20 12:13 PM   Specimen: BLOOD  Result Value Ref Range Status   Specimen Description   Final    BLOOD LEFT ANTECUBITAL Performed at [facility] [address] [place], [place] [address]    Special Requests  Final    BOTTLES DRAWN AEROBIC AND ANAEROBIC Blood Culture adequate volume Performed at [facility] [address], [place], [place] [address]    Culture   Final    NO GROWTH 2 DAYS Performed at [facility] [address] [place], [place] [address]    Report Status PENDING  Incomplete  Gastrointestinal Panel by PCR , Stool      Status: None   Collection Time: 06/09/20  7:00 PM   Specimen: Stool  Result Value Ref Range Status   Campylobacter species NOT DETECTED NOT DETECTED Final   Plesimonas shigelloides NOT DETECTED NOT DETECTED Final   Salmonella species NOT DETECTED NOT DETECTED Final   Yersinia enterocolitica NOT DETECTED NOT DETECTED Final   Vibrio species NOT DETECTED NOT DETECTED Final   Vibrio cholerae NOT DETECTED NOT DETECTED Final   Enteroaggregative E coli (EAEC) NOT DETECTED NOT DETECTED Final   Enteropathogenic E coli (EPEC) NOT DETECTED NOT DETECTED Final   Enterotoxigenic E coli (ETEC) NOT DETECTED NOT DETECTED Final   Shiga like toxin producing E coli (STEC) NOT DETECTED NOT DETECTED Final   Shigella/Enteroinvasive E coli (EIEC) NOT DETECTED NOT DETECTED Final   Cryptosporidium NOT DETECTED NOT DETECTED Final   Cyclospora cayetanensis NOT DETECTED NOT DETECTED Final   Entamoeba histolytica NOT DETECTED NOT DETECTED Final   Giardia lamblia NOT DETECTED NOT DETECTED Final   Adenovirus F40/41 NOT DETECTED NOT DETECTED Final   Astrovirus NOT DETECTED NOT DETECTED Final   Norovirus GI/GII NOT DETECTED NOT DETECTED Final   Rotavirus A NOT DETECTED NOT DETECTED Final   Sapovirus (I, II, IV, and V) NOT DETECTED NOT DETECTED Final    Comment: Performed at [facility], [place] [place], [place] [address]  C Difficile Quick Screen w PCR reflex     Status: Abnormal   Collection Time: 06/09/20  7:00 PM   Specimen: Stool  Result Value Ref Range Status   C Diff antigen POSITIVE (A) NEGATIVE Final   C Diff toxin NEGATIVE NEGATIVE Final   C Diff interpretation Results are indeterminate. See PCR results.  Final    Comment: Performed at [facility], [place] [address] [place], [place] [address]  MRSA PCR Screening     Status: None   Collection Time: 06/09/20  8:00 PM   Specimen: Nasopharyngeal  Result Value Ref Range Status   MRSA by PCR NEGATIVE NEGATIVE Final     Comment:        The GeneXpert MRSA Assay (FDA approved for NASAL specimens only), is one component of a comprehensive MRSA colonization surveillance program. It is not intended to diagnose MRSA infection nor to guide or monitor treatment for MRSA infections. Performed at [facility], [place] [address] [place], [place] [address]      Radiology Studies: No results found.   LOS: 2 days   Antonieta Pert, MD [facility]  06/12/2020, 9:44 AM

## 2020-06-12 NOTE — Consult Note (Addendum)
[facility]       Reason for Consult: fever    Referring Physician: Dr. Lupita Leash  Principal Problem:   FUO (fever of unknown origin) Active Problems:   Multiple myeloma not having achieved remission ([place])   Dehydration   Hypokalemia   Hypomagnesemia   Gastroesophageal reflux disease   Essential hypertension   Anemia   Fever, unknown origin   . sodium chloride  250 mL Intravenous Once  . acetaminophen  650 mg Oral Once  . acyclovir  400 mg Oral BID  . apixaban  2.5 mg Oral BID  . vitamin C  1,000 mg Oral q morning  . B-complex with vitamin C  1 tablet Oral Daily  . Chlorhexidine Gluconate Cloth  6 each Topical Daily  . cholecalciferol  5,000 Units Oral q morning  . lisinopril  10 mg Oral Daily  . loratadine  10 mg Oral q morning  . methylPREDNISolone (SOLU-MEDROL) injection  40 mg Intravenous Once  . pantoprazole  40 mg Oral Daily  . phosphorus  250 mg Oral BID  . vitamin B-12  3,000 mcg Oral Daily    Recommendations: Stop cefepime Observe off of antibiotics  Assessment: She has a fever and no obvious infection and no neutronpenia.  Differential for her is viral infection, medication/chemo effect.  No signs of active bacterial infection and procalcitonin, lactate both reassuring.  Not neutropenic.  C diff testing noted and has only had a bowel movement x 2 today associated with food. This would not be concerning for active C diff infection.  CT chest reassuring.    Antibiotics: Vancomycin x 1  Cefepime 4 days  HPI: Megan Velasquez is a 60 y.o. female with a hsitory of multiple myeloma and started chemotherapy 3/3 with carfilzomib/Reevlimid/dexamethasone and came in 3/6 with a fever and arthralgias.  Port-a-cath was placed for chemo on 3/2 by IR and has had no issues with it. Initial temperature was 103, WBC 14.1.  ANC over 1000, no neutropenia.  No sob, no cough, some diarrhea and C diff with negative toxin, positive Ag.  GI pathogen panel  negative.  Stool x 4 today.  No rash.  Blood cultures ngtd from 3/6. CT chest with no acute findings.      Review of Systems:  Constitutional: positive for fevers or negative for chills and sweats Respiratory: negative for cough or dyspnea on exertion Gastrointestinal: negative for nausea and vomiting Genitourinary: negative for frequency and dysuria Integument/breast: negative for rash Musculoskeletal: negative for myalgias and arthralgias All other systems reviewed and are negative    Past Medical History:  Diagnosis Date  . Cancer ([place])   . GERD (gastroesophageal reflux disease)   . History of kidney stones   . HTN (hypertension)   . Renal disorder     Social History   Tobacco Use  . Smoking status: Former Smoker    Packs/day: 1.00    Years: 28.00    Pack years: 28.00    Types: Cigarettes    Quit date: 10/08/2013    Years since quitting: 6.6  . Smokeless tobacco: Never Used  Vaping Use  . Vaping Use: Never used  Substance Use Topics  . Alcohol use: Not Currently  . Drug use: Never    Family History  Problem Relation Age of Onset  . Heart disease Mother   . Diabetes Mother   . Lung cancer Father   . COPD Father   . Colon cancer Neg Hx  No Known Allergies  Physical Exam: Constitutional: in no apparent distress  Vitals:   06/12/20 1437 06/12/20 1553  BP:  115/68  Pulse:  88  Resp:  18  Temp: (!) 100.5 F (38.1 C) 98.6 F (37 C)  SpO2:  99%   EYES: anicteric ENMT:  No thrush Cardiovascular: Cor RRR Respiratory: clear; GI: Bowel sounds are normal, liver is not enlarged, spleen is not enlarged Musculoskeletal: some right leg non-pitting edema, minimal Skin: negatives: no rash Hematologic: no cervical or supraclavicular LAD Neuro: non-focal  Lab Results  Component Value Date   WBC 13.5 (H) 06/12/2020   HGB 7.6 (L) 06/12/2020   HCT 25.1 (L) 06/12/2020   MCV 88.4 06/12/2020   PLT 79 (L) 06/12/2020    Lab Results  Component Value Date    CREATININE 0.73 06/12/2020   BUN 11 06/12/2020   NA 136 06/12/2020   K 4.1 06/12/2020   CL 106 06/12/2020   CO2 22 06/12/2020    Lab Results  Component Value Date   ALT 16 06/10/2020   AST 16 06/10/2020   ALKPHOS 34 (L) 06/10/2020     Microbiology: Recent Results (from the past 240 hour(s))  Resp Panel by RT-PCR (Flu A&B, Covid) Nasopharyngeal Swab     Status: None   Collection Time: 06/09/20 12:08 PM   Specimen: Nasopharyngeal Swab; Nasopharyngeal(NP) swabs in vial transport medium  Result Value Ref Range Status   SARS Coronavirus 2 by RT PCR NEGATIVE NEGATIVE Final    Comment: (NOTE) SARS-CoV-2 target nucleic acids are NOT DETECTED.  The SARS-CoV-2 RNA is generally detectable in upper respiratory specimens during the acute phase of infection. The lowest concentration of SARS-CoV-2 viral copies this assay can detect is 138 copies/mL. A negative result does not preclude SARS-Cov-2 infection and should not be used as the sole basis for treatment or other patient management decisions. A negative result may occur with  improper specimen collection/handling, submission of specimen other than nasopharyngeal swab, presence of viral mutation(s) within the areas targeted by this assay, and inadequate number of viral copies(<138 copies/mL). A negative result must be combined with clinical observations, patient history, and epidemiological information. The expected result is Negative.  Fact Sheet for Patients:  EntrepreneurPulse.com.au  Fact Sheet for Healthcare Providers:  IncredibleEmployment.be  This test is no t yet approved or cleared by the [place] FDA and  has been authorized for detection and/or diagnosis of SARS-CoV-2 by FDA under an Emergency Use Authorization (EUA). This EUA will remain  in effect (meaning this test can be used) for the duration of the COVID-19 declaration under Section 564(b)(1) of the Act, 21 U.S.C.section  360bbb-3(b)(1), unless the authorization is terminated  or revoked sooner.       Influenza A by PCR NEGATIVE NEGATIVE Final   Influenza B by PCR NEGATIVE NEGATIVE Final    Comment: (NOTE) The Xpert Xpress SARS-CoV-2/FLU/RSV plus assay is intended as an aid in the diagnosis of influenza from Nasopharyngeal swab specimens and should not be used as a sole basis for treatment. Nasal washings and aspirates are unacceptable for Xpert Xpress SARS-CoV-2/FLU/RSV testing.  Fact Sheet for Patients: EntrepreneurPulse.com.au  Fact Sheet for Healthcare Providers: IncredibleEmployment.be  This test is not yet approved or cleared by the [place] FDA and has been authorized for detection and/or diagnosis of SARS-CoV-2 by FDA under an Emergency Use Authorization (EUA). This EUA will remain in effect (meaning this test can be used) for the duration of the COVID-19 declaration under Section 564(b)(1)  of the Act, 21 U.S.C. section 360bbb-3(b)(1), unless the authorization is terminated or revoked.  Performed at [facility], [place] [address] [place], [place] [address]   Blood Culture (routine x 2)     Status: None (Preliminary result)   Collection Time: 06/09/20 12:08 PM   Specimen: BLOOD  Result Value Ref Range Status   Specimen Description   Final    BLOOD BLOOD LEFT HAND Performed at [facility] [address], [place], [place] [address]    Special Requests   Final    BOTTLES DRAWN AEROBIC AND ANAEROBIC Blood Culture adequate volume Performed at [facility] [address] [place], [place] [address]    Culture   Final    NO GROWTH 3 DAYS Performed at [facility], [place] [address] [place], [place] [address]    Report Status PENDING  Incomplete  Urine culture     Status: None   Collection Time: 06/09/20 12:08 PM   Specimen: In/Out Cath Urine  Result Value Ref Range Status    Specimen Description   Final    IN/OUT CATH URINE Performed at [facility] [address] [place], [place] [address]    Special Requests   Final    NONE Performed at [facility], [place] [address], [place], [place] [address]    Culture   Final    NO GROWTH Performed at [facility], [place] [address] [place], [place] [address]    Report Status 06/10/2020 FINAL  Final  Blood Culture (routine x 2)     Status: None (Preliminary result)   Collection Time: 06/09/20 12:13 PM   Specimen: BLOOD  Result Value Ref Range Status   Specimen Description   Final    BLOOD LEFT ANTECUBITAL Performed at [facility] [address], [place], [place] [address]    Special Requests   Final    BOTTLES DRAWN AEROBIC AND ANAEROBIC Blood Culture adequate volume Performed at [facility] [address] [place], [place] [address]    Culture   Final    NO GROWTH 3 DAYS Performed at [facility], [place] [address] [place],  [address]    Report Status PENDING  Incomplete  Gastrointestinal Panel by PCR , Stool     Status: None   Collection Time: 06/09/20  7:00 PM   Specimen: Stool  Result Value Ref Range Status   Campylobacter species NOT DETECTED NOT DETECTED Final   Plesimonas shigelloides NOT DETECTED NOT DETECTED Final   Salmonella species NOT DETECTED NOT DETECTED Final   Yersinia enterocolitica NOT DETECTED NOT DETECTED Final   Vibrio species NOT DETECTED NOT DETECTED Final   Vibrio cholerae NOT DETECTED NOT DETECTED Final   Enteroaggregative E coli (EAEC) NOT DETECTED NOT DETECTED Final   Enteropathogenic E coli (EPEC) NOT DETECTED NOT DETECTED Final   Enterotoxigenic E coli (ETEC) NOT DETECTED NOT DETECTED Final   Shiga like toxin producing E coli (STEC) NOT DETECTED NOT DETECTED Final   Shigella/Enteroinvasive E coli (EIEC) NOT DETECTED NOT DETECTED Final   Cryptosporidium NOT DETECTED NOT DETECTED  Final   Cyclospora cayetanensis NOT DETECTED NOT DETECTED Final   Entamoeba histolytica NOT DETECTED NOT DETECTED Final   Giardia lamblia NOT DETECTED NOT DETECTED Final   Adenovirus F40/41 NOT DETECTED NOT DETECTED Final   Astrovirus NOT DETECTED NOT DETECTED Final   Norovirus GI/GII NOT DETECTED NOT DETECTED Final  Rotavirus A NOT DETECTED NOT DETECTED Final   Sapovirus (I, II, IV, and V) NOT DETECTED NOT DETECTED Final    Comment: Performed at [facility], [place], [place] [address]  C Difficile Quick Screen w PCR reflex     Status: Abnormal   Collection Time: 06/09/20  7:00 PM   Specimen: Stool  Result Value Ref Range Status   C Diff antigen POSITIVE (A) NEGATIVE Final   C Diff toxin NEGATIVE NEGATIVE Final   C Diff interpretation Results are indeterminate. See PCR results.  Final    Comment: Performed at [facility], [place] [address] [place], [place] [address]  MRSA PCR Screening     Status: None   Collection Time: 06/09/20  8:00 PM   Specimen: Nasopharyngeal  Result Value Ref Range Status   MRSA by PCR NEGATIVE NEGATIVE Final    Comment:        The GeneXpert MRSA Assay (FDA approved for NASAL specimens only), is one component of a comprehensive MRSA colonization surveillance program. It is not intended to diagnose MRSA infection nor to guide or monitor treatment for MRSA infections. Performed at [facility], [place] [address] [place], [place] [address]     Robert W Comer, MD [facility] [email or web address] 06/12/2020, 5:29 PM

## 2020-06-13 ENCOUNTER — Ambulatory Visit: Payer: BC Managed Care – PPO

## 2020-06-13 ENCOUNTER — Other Ambulatory Visit: Payer: BC Managed Care – PPO

## 2020-06-13 ENCOUNTER — Ambulatory Visit: Payer: BC Managed Care – PPO | Admitting: Hematology

## 2020-06-13 ENCOUNTER — Telehealth: Payer: Self-pay | Admitting: Hematology

## 2020-06-13 DIAGNOSIS — R509 Fever, unspecified: Secondary | ICD-10-CM | POA: Diagnosis not present

## 2020-06-13 DIAGNOSIS — D72829 Elevated white blood cell count, unspecified: Secondary | ICD-10-CM

## 2020-06-13 LAB — TYPE AND SCREEN
ABO/RH(D): B POS
Antibody Screen: NEGATIVE
Unit division: 0

## 2020-06-13 LAB — COMPREHENSIVE METABOLIC PANEL
ALT: 43 U/L (ref 0–44)
AST: 23 U/L (ref 15–41)
Albumin: 2.1 g/dL — ABNORMAL LOW (ref 3.5–5.0)
Alkaline Phosphatase: 38 U/L (ref 38–126)
Anion gap: 8 (ref 5–15)
BUN: 15 mg/dL (ref 6–20)
CO2: 22 mmol/L (ref 22–32)
Calcium: 7.9 mg/dL — ABNORMAL LOW (ref 8.9–10.3)
Chloride: 106 mmol/L (ref 98–111)
Creatinine, Ser: 0.58 mg/dL (ref 0.44–1.00)
GFR, Estimated: >60 mL/min (ref >60)
Glucose, Bld: 120 mg/dL — ABNORMAL HIGH (ref 70–99)
Potassium: 3.9 mmol/L (ref 3.5–5.1)
Sodium: 136 mmol/L (ref 135–145)
Total Bilirubin: 0.5 mg/dL (ref 0.3–1.2)
Total Protein: 7.2 g/dL (ref 6.5–8.1)

## 2020-06-13 LAB — CBC WITH DIFFERENTIAL/PLATELET
Abs Immature Granulocytes: 0.51 10*3/uL — ABNORMAL HIGH (ref 0.00–0.07)
Basophils Absolute: 0 10*3/uL (ref 0.0–0.1)
Basophils Relative: 0 %
Eosinophils Absolute: 0 10*3/uL (ref 0.0–0.5)
Eosinophils Relative: 0 %
HCT: 30.2 % — ABNORMAL LOW (ref 36.0–46.0)
Hemoglobin: 9.5 g/dL — ABNORMAL LOW (ref 12.0–15.0)
Immature Granulocytes: 5 %
Lymphocytes Relative: 6 %
Lymphs Abs: 0.6 10*3/uL — ABNORMAL LOW (ref 0.7–4.0)
MCH: 26.8 pg (ref 26.0–34.0)
MCHC: 31.5 g/dL (ref 30.0–36.0)
MCV: 85.3 fL (ref 80.0–100.0)
Monocytes Absolute: 0.7 10*3/uL (ref 0.1–1.0)
Monocytes Relative: 7 %
Neutro Abs: 7.7 10*3/uL (ref 1.7–7.7)
Neutrophils Relative %: 82 %
Platelets: 93 10*3/uL — ABNORMAL LOW (ref 150–400)
RBC: 3.54 MIL/uL — ABNORMAL LOW (ref 3.87–5.11)
RDW: 19.7 % — ABNORMAL HIGH (ref 11.5–15.5)
WBC: 9.4 10*3/uL (ref 4.0–10.5)
nRBC: 0 % (ref 0.0–0.2)

## 2020-06-13 LAB — BPAM RBC
Blood Product Expiration Date: 202204112359
ISSUE DATE / TIME: 202203091814
Unit Type and Rh: 7300

## 2020-06-13 LAB — PROCALCITONIN: Procalcitonin: 0.17 ng/mL

## 2020-06-13 MED ORDER — ALPRAZOLAM 0.25 MG PO TABS: 0.2500 mg | ORAL_TABLET | Freq: Two times a day (BID) | ORAL | Status: DC | PRN

## 2020-06-13 MED ORDER — MELATONIN 5 MG PO TABS
5.0000 mg | ORAL_TABLET | Freq: Every evening | ORAL | Status: DC | PRN
  Administered 2020-06-13: 5 mg via ORAL
  Filled 2020-06-13: qty 1

## 2020-06-13 NOTE — Progress Notes (Signed)
PROGRESS NOTE    Megan Velasquez  HWT:[id number] DOB: 1960/05/18 DOA: 06/09/2020 PCP: [name], MD   Chief Complaint  Patient presents with  . chemo card  . Generalized Body Aches  . Fever  . Shortness of Breath   Brief Narrative: 60 year old female with history of multiple myeloma, GERD, hypertension, Port-A-Cath placed 4 days prior to admission and placed on chemo last Thursday and Friday subsequently developed fever malaise fatigue and admitted for fever of unknown origin.  Chest x-ray unremarkable UA unremarkable placed on IV antibiotics and admitted.  Patient has had diarrhea-C. difficile antigen positive but toxin negative, GI panel negative. Covid negative blood culture from 3/6 - so far  Subjective: No fever since yesterday afternoon.  Patient feels much better.  Less anxious.   Resting on the bedside chair.    Assessment & Plan: FUO :Chest x-ray unremarkable UA unremarkable. was placed on IV antibiotics and admitted. Covid negative blood culture from 3/6 - so far.  CT chest no acute finding.Followed by hematology.She has had no obvious infection and no neutropenia- antibiotic has been discontinued by ID 06/12/20- could be viral infection versus medication versus chemo induced.  Monitoring off antibiotics.  C. difficile antigen positive but toxin negative, reviewed by ID since patient is not symptomatic less likely active infection.GI panel negative.  Shortness of breath/dyspnea with minimal activity: No PE on CTA.  Likely component of anxiety anemia ICs.  Add Xanax as needed.  Is already on Atarax as needed.    Dehydration: HCTZ discontinued.  Encourage p.o.  Hypokalemia/hypomagnesemia and hypophosphatemia: Electrolytes are stable.  HOLDING hctz.    Anemia likely in the setting of anemia of chronic disease and malignancy: Status post 1 unit PRBC transfusion 3/9.  Anemia panel shows high ferritin, iron level 13, J17 folic acid normal.Defer IV iron to hematology Recent Labs   Lab 06/09/20 1208 06/10/20 0957 06/11/20 0537 06/12/20 0528 06/13/20 0728  HGB 9.9* 8.6* 8.2* 7.6* 9.5*  HCT 31.1* 27.2* 26.2* 25.1* 30.2*   Multiple myeloma not having achieved remission:On induction therapy with carfilzomib/Revlimid/dexamethasone She is also on aspirin and Eliquis due to Revlimid.On prophylactic Acyclovir since being on chemo.  Oncology monitoring.  GERD: Continue PPI  Essential hypertension: Stable on lisinopril.  HCTZ discontinued due to electrolyte imbalance  Diet Order            Diet regular Room service appropriate? Yes; Fluid consistency: Thin  Diet effective now                 Patient's Body mass index is 23.26 kg/m.  DVT prophylaxis: apixaban (ELIQUIS) tablet 2.5 mg Start: 06/09/20 2200 Code Status:   Code Status: Full Code  Family Communication: plan of care discussed with patient at bedside.  Status is: Inpatient Remains inpatient appropriate because:Ongoing diagnostic testing needed not appropriate for outpatient work up, IV treatments appropriate due to intensity of illness or inability to take PO and Inpatient level of care appropriate due to severity of illness  Dispo: The patient is from: Home              Anticipated d/c is to: Home tomorrow if remains fever free for at least 24 hours and if okay w/ oncology              Patient currently is not medically stable to d/c.   Difficult to place patient No  Unresulted Labs (From admission, onward)          Start  Ordered   06/13/20 1000  Multiple Myeloma Panel (SPEP&IFE w/QIG)  Add-on,   AD       Question:  Specimen collection method  Answer:  Lab=Lab collect   06/13/20 0959   06/13/20 0500  Procalcitonin  Daily,   R     Question:  Specimen collection method  Answer:  Lab=Lab collect   06/12/20 1345   06/12/20 0000  Type and screen  R       Comments: [facility]    06/12/20 1040          Medications reviewed:  Scheduled Meds: . acyclovir  400 mg Oral  BID  . apixaban  2.5 mg Oral BID  . vitamin C  1,000 mg Oral q morning  . B-complex with vitamin C  1 tablet Oral Daily  . Chlorhexidine Gluconate Cloth  6 each Topical Daily  . cholecalciferol  5,000 Units Oral q morning  . lisinopril  10 mg Oral Daily  . loratadine  10 mg Oral q morning  . pantoprazole  40 mg Oral Daily  . phosphorus  250 mg Oral BID  . vitamin B-12  3,000 mcg Oral Daily   Continuous Infusions: . sodium chloride 100 mL/hr at 06/13/20 0718    Consultants:see note  Procedures:see note  Antimicrobials: Anti-infectives (From admission, onward)   Start     Dose/Rate Route Frequency Ordered Stop   06/09/20 2200  acyclovir (ZOVIRAX) tablet 400 mg        400 mg Oral 2 times daily 06/09/20 1546     06/09/20 2200  ceFEPIme (MAXIPIME) 2 g in sodium chloride 0.9 % 100 mL IVPB  Status:  Discontinued        2 g 200 mL/hr over 30 Minutes Intravenous Every 8 hours 06/09/20 1621 06/12/20 1817   06/09/20 1300  vancomycin (VANCOCIN) IVPB 1000 mg/200 mL premix        1,000 mg 200 mL/hr over 60 Minutes Intravenous  Once 06/09/20 1219 06/09/20 1444   06/09/20 1230  ceFEPIme (MAXIPIME) 2 g in sodium chloride 0.9 % 100 mL IVPB        2 g 200 mL/hr over 30 Minutes Intravenous  Once 06/09/20 1219 06/09/20 1328     Culture/Microbiology    Component Value Date/Time   SDES  06/09/2020 1213    BLOOD LEFT ANTECUBITAL Performed at [facility], [address] [place], [place] [address]    SPECREQUEST  06/09/2020 1213    BOTTLES DRAWN AEROBIC AND ANAEROBIC Blood Culture adequate volume Performed at [facility], [address] [place], [place] [address]    CULT  06/09/2020 1213    NO GROWTH 3 DAYS Performed at [facility], [address] [place], [place] [address]    REPTSTATUS PENDING 06/09/2020 1213    Other culture-see note  Objective: Vitals: Today's Vitals   06/12/20 2110 06/13/20 0048 06/13/20 0608 06/13/20 1245  BP:   118/66 126/74   Pulse:  69 72   Resp:  14 14   Temp:  (!) 97.5 F (36.4 C) 97.7 F (36.5 C)   TempSrc:  Oral Oral   SpO2:  100% 100%   Weight:      Height:      PainSc: 0-No pain   0-No pain    Intake/Output Summary (Last 24 hours) at 06/13/2020 1311 Last data filed at 06/13/2020 0936 Gross per 24 hour  Intake 562.92 ml  Output 2000 ml  Net -  1437.08 ml   Filed Weights   06/09/20 1205  Weight: 63.4 kg   Weight change:   Intake/Output from previous day: 03/09 0701 - 03/10 0700 In: 562.9 [P.O.:240; Blood:322.9] Out: 2200 [Urine:2200] Intake/Output this shift: Total I/O In: 240 [P.O.:240] Out: 100 [Urine:100] Filed Weights   06/09/20 1205  Weight: 63.4 kg    Examination: General exam: AAOx3,NAD, weak appearing. HEENT:Oral mucosa moist, Ear/Nose WNL grossly, dentition normal. Respiratory system: bilaterally diminishedd,no wheezing or crackles,no use of accessory muscle Cardiovascular system: S1 & S2 +, No JVD,. Gastrointestinal system: Abdomen soft, NT,ND, BS+ Nervous System:Alert, awake, moving extremities and grossly nonfocal Extremities: No edema, distal peripheral pulses palpable.  Skin: No rashes,no icterus. MSK: Normal muscle bulk,tone, power  Data Reviewed: I have personally reviewed following labs and imaging studies CBC: Recent Labs  Lab 06/09/20 1208 06/10/20 0957 06/11/20 0537 06/12/20 0528 06/13/20 0728  WBC 14.1* 15.0* 18.1* 13.5* 9.4  NEUTROABS 11.7* 11.9* 14.8* 11.2* 7.7  HGB 9.9* 8.6* 8.2* 7.6* 9.5*  HCT 31.1* 27.2* 26.2* 25.1* 30.2*  MCV 85.4 85.5 87.3 88.4 85.3  PLT 142* 103* 85* 79* 93*   Basic Metabolic Panel: Recent Labs  Lab 06/09/20 1208 06/10/20 0957 06/11/20 0537 06/12/20 0528 06/13/20 0728  NA 135 139 134* 136 136  K 3.5 3.1* 4.4 4.1 3.9  CL 96* 104 104 106 106  CO2 _0 GLUCOSE 112* 143* 103* 93 120*  BUN _1 CREATININE 0.81 0.87 0.73 0.73 0.58  CALCIUM 8.8* 7.9* 7.9* 8.0* 7.9*  MG  --  1.3*  2.0 1.8  --   PHOS  --   --  2.0* 2.8  --    GFR: Estimated Creatinine Clearance: 67.3 mL/min (by C-G formula based on SCr of 0.58 mg/dL). Liver Function Tests: Recent Labs  Lab 06/09/20 1208 06/10/20 0957 06/11/20 0537 06/12/20 0528 06/13/20 0728  AST 16 16  --   --  23  ALT 15 16  --   --  43  ALKPHOS 39 34*  --   --  38  BILITOT 1.1 0.9  --   --  0.5  PROT 8.8* 7.3  --   --  7.2  ALBUMIN 3.0* 2.3* 2.3* 2.1* 2.1*   No results for input(s): LIPASE, AMYLASE in the last 168 hours. No results for input(s): AMMONIA in the last 168 hours. Coagulation Profile: Recent Labs  Lab 06/09/20 1208  INR 1.4*   Cardiac Enzymes: No results for input(s): CKTOTAL, CKMB, CKMBINDEX, TROPONINI in the last 168 hours. BNP (last 3 results) No results for input(s): PROBNP in the last 8760 hours. HbA1C: No results for input(s): HGBA1C in the last 72 hours. CBG: No results for input(s): GLUCAP in the last 168 hours. Lipid Profile: No results for input(s): CHOL, HDL, LDLCALC, TRIG, CHOLHDL, LDLDIRECT in the last 72 hours. Thyroid Function Tests: No results for input(s): TSH, T4TOTAL, FREET4, T3FREE, THYROIDAB in the last 72 hours. Anemia Panel: Recent Labs    06/11/20 0537  VITAMINB12 888  FOLATE 30.5  FERRITIN 1,397*  TIBC 162*  IRON 13*   Sepsis Labs: Recent Labs  Lab 06/09/20 1208 06/12/20 0528 06/13/20 0728  PROCALCITON  --  0.36 0.17  LATICACIDVEN 1.2  --   --     Recent Results (from the past 240 hour(s))  Resp Panel by RT-PCR (Flu A&B, Covid) Nasopharyngeal Swab     Status: None   Collection Time: 06/09/20 12:08 PM   Specimen:  Nasopharyngeal Swab; Nasopharyngeal(NP) swabs in vial transport medium  Result Value Ref Range Status   SARS Coronavirus 2 by RT PCR NEGATIVE NEGATIVE Final    Comment: (NOTE) SARS-CoV-2 target nucleic acids are NOT DETECTED.  The SARS-CoV-2 RNA is generally detectable in upper respiratory specimens during the acute phase of infection. The  lowest concentration of SARS-CoV-2 viral copies this assay can detect is 138 copies/mL. A negative result does not preclude SARS-Cov-2 infection and should not be used as the sole basis for treatment or other patient management decisions. A negative result may occur with  improper specimen collection/handling, submission of specimen other than nasopharyngeal swab, presence of viral mutation(s) within the areas targeted by this assay, and inadequate number of viral copies(<138 copies/mL). A negative result must be combined with clinical observations, patient history, and epidemiological information. The expected result is Negative.  Fact Sheet for Patients:  EntrepreneurPulse.com.au  Fact Sheet for Healthcare Providers:  IncredibleEmployment.be  This test is no t yet approved or cleared by the [place] FDA and  has been authorized for detection and/or diagnosis of SARS-CoV-2 by FDA under an Emergency Use Authorization (EUA). This EUA will remain  in effect (meaning this test can be used) for the duration of the COVID-19 declaration under Section 564(b)(1) of the Act, 21 U.S.C.section 360bbb-3(b)(1), unless the authorization is terminated  or revoked sooner.       Influenza A by PCR NEGATIVE NEGATIVE Final   Influenza B by PCR NEGATIVE NEGATIVE Final    Comment: (NOTE) The Xpert Xpress SARS-CoV-2/FLU/RSV plus assay is intended as an aid in the diagnosis of influenza from Nasopharyngeal swab specimens and should not be used as a sole basis for treatment. Nasal washings and aspirates are unacceptable for Xpert Xpress SARS-CoV-2/FLU/RSV testing.  Fact Sheet for Patients: EntrepreneurPulse.com.au  Fact Sheet for Healthcare Providers: IncredibleEmployment.be  This test is not yet approved or cleared by the [place] FDA and has been authorized for detection and/or diagnosis of SARS-CoV-2 by FDA under  an Emergency Use Authorization (EUA). This EUA will remain in effect (meaning this test can be used) for the duration of the COVID-19 declaration under Section 564(b)(1) of the Act, 21 U.S.C. section 360bbb-3(b)(1), unless the authorization is terminated or revoked.  Performed at [facility], [place] [address], [place], [place] [address]   Blood Culture (routine x 2)     Status: None (Preliminary result)   Collection Time: 06/09/20 12:08 PM   Specimen: BLOOD  Result Value Ref Range Status   Specimen Description   Final    BLOOD BLOOD LEFT HAND Performed at [facility] [address], [place], [place] [address]    Special Requests   Final    BOTTLES DRAWN AEROBIC AND ANAEROBIC Blood Culture adequate volume Performed at [facility] [address] [place], [place] [address]    Culture   Final    NO GROWTH 3 DAYS Performed at [facility], [place] [address], [place], [place] [address]    Report Status PENDING  Incomplete  Urine culture     Status: None   Collection Time: 06/09/20 12:08 PM   Specimen: In/Out Cath Urine  Result Value Ref Range Status   Specimen Description   Final    IN/OUT CATH URINE Performed at [facility] [address] [place], [place] [address]    Special Requests   Final    NONE Performed at [facility], [place]  [address], [place], [place] [address]    Culture   Final    NO GROWTH Performed at [facility], [place] [address], [place], [place] [address]    Report Status 06/10/2020 FINAL  Final  Blood Culture (routine x 2)     Status: None (Preliminary result)   Collection Time: 06/09/20 12:13 PM   Specimen: BLOOD  Result Value Ref Range Status   Specimen Description   Final    BLOOD LEFT ANTECUBITAL Performed at [facility] [address] [place], [place] [address]    Special Requests   Final    BOTTLES DRAWN AEROBIC AND  ANAEROBIC Blood Culture adequate volume Performed at [facility] [address], [place], [place] [address]    Culture   Final    NO GROWTH 3 DAYS Performed at [facility], [place] [address], [place], [place] [address]    Report Status PENDING  Incomplete  Gastrointestinal Panel by PCR , Stool     Status: None   Collection Time: 06/09/20  7:00 PM   Specimen: Stool  Result Value Ref Range Status   Campylobacter species NOT DETECTED NOT DETECTED Final   Plesimonas shigelloides NOT DETECTED NOT DETECTED Final   Salmonella species NOT DETECTED NOT DETECTED Final   Yersinia enterocolitica NOT DETECTED NOT DETECTED Final   Vibrio species NOT DETECTED NOT DETECTED Final   Vibrio cholerae NOT DETECTED NOT DETECTED Final   Enteroaggregative E coli (EAEC) NOT DETECTED NOT DETECTED Final   Enteropathogenic E coli (EPEC) NOT DETECTED NOT DETECTED Final   Enterotoxigenic E coli (ETEC) NOT DETECTED NOT DETECTED Final   Shiga like toxin producing E coli (STEC) NOT DETECTED NOT DETECTED Final   Shigella/Enteroinvasive E coli (EIEC) NOT DETECTED NOT DETECTED Final   Cryptosporidium NOT DETECTED NOT DETECTED Final   Cyclospora cayetanensis NOT DETECTED NOT DETECTED Final   Entamoeba histolytica NOT DETECTED NOT DETECTED Final   Giardia lamblia NOT DETECTED NOT DETECTED Final   Adenovirus F40/41 NOT DETECTED NOT DETECTED Final   Astrovirus NOT DETECTED NOT DETECTED Final   Norovirus GI/GII NOT DETECTED NOT DETECTED Final   Rotavirus A NOT DETECTED NOT DETECTED Final   Sapovirus (I, II, IV, and V) NOT DETECTED NOT DETECTED Final    Comment: Performed at [facility], [address] [place], [place] [address]  C Difficile Quick Screen w PCR reflex     Status: Abnormal   Collection Time: 06/09/20  7:00 PM   Specimen: Stool  Result Value Ref Range Status   C Diff antigen POSITIVE (A) NEGATIVE Final   C Diff toxin NEGATIVE NEGATIVE Final   C Diff interpretation  Results are indeterminate. See PCR results.  Final    Comment: Performed at [facility], [place] [address] [place], [place] [address]  MRSA PCR Screening     Status: None   Collection Time: 06/09/20  8:00 PM   Specimen: Nasopharyngeal  Result Value Ref Range Status   MRSA by PCR NEGATIVE NEGATIVE Final    Comment:        The GeneXpert MRSA Assay (FDA approved for NASAL specimens only), is one component of a comprehensive MRSA colonization surveillance program. It is not intended to diagnose MRSA infection nor to guide or monitor treatment for MRSA infections. Performed at [facility], [place] [address], [place], [place] [address]      Radiology Studies: CT ANGIO CHEST PE W OR WO CONTRAST  Result Date: 06/12/2020 CLINICAL DATA:  Shortness of breath for 3 days EXAM: CT ANGIOGRAPHY CHEST WITH CONTRAST TECHNIQUE: Multidetector CT imaging of the chest was performed using the standard protocol during bolus administration of intravenous contrast. Multiplanar CT image reconstructions and MIPs were obtained to evaluate the vascular anatomy. CONTRAST:  75mL OMNIPAQUE IOHEXOL 350 MG/ML SOLN COMPARISON:  Radiograph June 09, 2020 FINDINGS: Cardiovascular: There is a optimal opacification of the pulmonary arteries. There is no central,segmental, or subsegmental filling defects within the pulmonary arteries. The heart is normal in size. No pericardial effusion or thickening. No evidence right heart strain. There is normal three-vessel brachiocephalic anatomy without proximal stenosis. Scattered aortic atherosclerosis is noted. Mediastinum/Nodes: No hilar, mediastinal, or axillary adenopathy. Thyroid gland, trachea, and esophagus demonstrate no significant findings. Right-sided MediPort catheter seen with the tip at the superior cavoatrial junction. Lungs/Pleura: The lungs are clear. No pleural effusion or pneumothorax. No airspace consolidation. Upper Abdomen: No acute  abnormalities present in the visualized portions of the upper abdomen. Diffuse low density seen throughout the right Musculoskeletal: No chest wall abnormality. No acute or significant osseous findings. Review of the MIP images confirms the above findings. IMPRESSION: No central, segmental, or subsegmental pulmonary embolism No acute intrathoracic pathology to explain the patient's symptoms Aortic Atherosclerosis (ICD10-I70.0). Electronically Signed   By: Bindu  Avutu M.D.   On: 06/12/2020 12:10     LOS: 3 days   Ramesh KC, MD [facility]  06/13/2020, 1:11 PM   

## 2020-06-13 NOTE — Progress Notes (Signed)
HEMATOLOGY-ONCOLOGY PROGRESS NOTE DOS 06/12/2020  SUBJECTIVE:  [name] notes some shortness of breath and fatigue.  Had a CTA of the chest which showed no PE.  Had another fever spike to 103F --infectious disease was consulted .  No other overt focal symptoms .  Patient notes she clinically feels better.  .We discussed holding her carfilzomib due on 3/10 and 3/11 . Soft stools but no overt diarrhea.  Notes increased p.o. intake. No pain or discomfort at the port site.  Oncology History  Multiple myeloma not having achieved remission ([place])  05/30/2020 Initial Diagnosis   Multiple myeloma not having achieved remission ([place])   06/06/2020 -  Chemotherapy    Patient is on Treatment Plan: MYELOMA NEWLY DIAGNOSED TRANSPLANT CANDIDATE CARFILZOMIB (20/36)/ LENALIDOMIDE / DEXAMETHASONE (40/20) (KRD) Q28D         REVIEW OF SYSTEMS:   Constitutional: She has been having fevers and chills Eyes: Denies blurriness of vision Ears, nose, mouth, throat, and face: Denies mucositis or sore throat Respiratory: Reports dyspnea Cardiovascular: Denies palpitation, chest discomfort Gastrointestinal: Denies nausea vomiting but had diarrhea on admission which is now resolved Skin: Denies abnormal skin rashes Lymphatics: Has bruising near Port-A-Cath that was recently placed Neurological:Denies numbness, tingling or new weaknesses Behavioral/Psych: Mood is stable, no new changes  Extremities: No lower extremity edema All other systems were reviewed with the patient and are negative.  I have reviewed the past medical history, past surgical history, social history and family history with the patient and they are unchanged from previous note.   PHYSICAL EXAMINATION: ECOG PERFORMANCE STATUS: 1 - Symptomatic but completely ambulatory  Vitals:   06/13/20 0048 06/13/20 0608  BP: 118/66 126/74  Pulse: 69 72  Resp: 14 14  Temp: (!) 97.5 F (36.4 C) 97.7 F (36.5 C)  SpO2: 100% 100%   Filed Weights    06/09/20 1205  Weight: 139 lb 12.8 oz (63.4 kg)    Intake/Output from previous day: 03/09 0701 - 03/10 0700 In: 562.9 [P.O.:240; Blood:322.9] Out: 2200 [Urine:2200]  GENERAL: Alert, no distress SKIN: skin color, texture, turgor are normal, no rashes or significant lesions EYES: normal, Conjunctiva are pink and non-injected, sclera clear OROPHARYNX:no exudate, no erythema and lips, buccal mucosa, and tongue normal  LUNGS: clear to auscultation and percussion with normal breathing effort HEART: regular rate & rhythm and no murmurs and no lower extremity edema ABDOMEN:abdomen soft, non-tender and normal bowel sounds NEURO: alert & oriented x 3 with fluent speech, no focal motor/sensory deficits  Port-A-Cath site with bruising but no erythema  LABORATORY DATA:  I have reviewed the data as listed CMP Latest Ref Rng & Units 06/13/2020 06/12/2020 06/11/2020  Glucose 70 - 99 mg/dL 120(H) 93 103(H)  BUN 6 - 20 mg/dL _0 Creatinine 0.44 - 1.00 mg/dL 0.58 0.73 0.73  Sodium 135 - 145 mmol/L 136 136 134(L)  Potassium 3.5 - 5.1 mmol/L 3.9 4.1 4.4  Chloride 98 - 111 mmol/L 106 106 104  CO2 22 - 32 mmol/L _1 Calcium 8.9 - 10.3 mg/dL 7.9(L) 8.0(L) 7.9(L)  Total Protein 6.5 - 8.1 g/dL 7.2 - -  Total Bilirubin 0.3 - 1.2 mg/dL 0.5 - -  Alkaline Phos 38 - 126 U/L 38 - -  AST 15 - 41 U/L 23 - -  ALT 0 - 44 U/L 43 - -   . CBC Latest Ref Rng & Units 06/12/2020 06/11/2020  WBC 4.0 - 10.5 K/uL 13.5(H) 18.1(H)  Hemoglobin 12.0 -  15.0 g/dL 7.6(L) 8.2(L)  Hematocrit 36.0 - 46.0 % 25.1(L) 26.2(L)  Platelets 150 - 400 K/uL 79(L) 85(L)     CT ANGIO CHEST PE W OR WO CONTRAST  Result Date: 06/12/2020 CLINICAL DATA:  Shortness of breath for 3 days EXAM: CT ANGIOGRAPHY CHEST WITH CONTRAST TECHNIQUE: Multidetector CT imaging of the chest was performed using the standard protocol during bolus administration of intravenous contrast. Multiplanar CT image reconstructions and MIPs were obtained to evaluate  the vascular anatomy. CONTRAST:  43m OMNIPAQUE IOHEXOL 350 MG/ML SOLN COMPARISON:  Radiograph June 09, 2020 FINDINGS: Cardiovascular: There is a optimal opacification of the pulmonary arteries. There is no central,segmental, or subsegmental filling defects within the pulmonary arteries. The heart is normal in size. No pericardial effusion or thickening. No evidence right heart strain. There is normal three-vessel brachiocephalic anatomy without proximal stenosis. Scattered aortic atherosclerosis is noted. Mediastinum/Nodes: No hilar, mediastinal, or axillary adenopathy. Thyroid gland, trachea, and esophagus demonstrate no significant findings. Right-sided MediPort catheter seen with the tip at the superior cavoatrial junction. Lungs/Pleura: The lungs are clear. No pleural effusion or pneumothorax. No airspace consolidation. Upper Abdomen: No acute abnormalities present in the visualized portions of the upper abdomen. Diffuse low density seen throughout the right Musculoskeletal: No chest wall abnormality. No acute or significant osseous findings. Review of the MIP images confirms the above findings. IMPRESSION: No central, segmental, or subsegmental pulmonary embolism No acute intrathoracic pathology to explain the patient's symptoms Aortic Atherosclerosis (ICD10-I70.0). Electronically Signed   By: BPrudencio PairM.D.   On: 06/12/2020 12:10   NM PET Image Initial (PI) Whole Body  Result Date: 05/17/2020 CLINICAL DATA:  Initial treatment strategy for multiple myeloma. EXAM: NUCLEAR MEDICINE PET WHOLE BODY TECHNIQUE: 7.0 mCi F-18 FDG was injected intravenously. Full-ring PET imaging was performed from the head to foot after the radiotracer. CT data was obtained and used for attenuation correction and anatomic localization. Fasting blood glucose: 98 mg/dl COMPARISON:  Multiple exams, including CT chest report from 01/23/2019 FINDINGS: Mediastinal blood pool activity: SUV max 2.4 HEAD/NECK: No significant abnormal  hypermetabolic activity in this region. Incidental CT findings: Chronic right maxillary sinusitis. Bilateral common carotid atherosclerotic calcification. CHEST: Sharply defined 3.1 by 1.2 cm ground-glass density lesion in the left upper lobe on image 93 of series 4 is hypermetabolic with maximum SUV 11.4. A smaller focus of subpleural ground-glass opacity in the left upper lobe measuring 1.3 by 0.8 cm on image 103 of series 4 has a maximum SUV of 3.1. 0.6 by 0.4 cm left lower lobe nodule on image 115 of series 4, without appreciable accentuated metabolic activity. Subpleural ground-glass nodularity in the left lower lobe measuring 0.9 by 0.6 cm without hypermetabolic activity on image 35 series 8. Mildly focally accentuated distal esophageal activity with maximum SUV 4.3. This is most commonly physiologic at this location. Incidental CT findings: Atherosclerotic calcification of the aortic arch and branch vessels. ABDOMEN/PELVIS: No significant abnormal hypermetabolic activity in this region. Incidental CT findings: Aortoiliac atherosclerotic vascular disease. Cholecystectomy. Sigmoid colon diverticulosis. Pelvic floor laxity with cystocele. SKELETON: Diffusely accentuated marrow activity throughout the axial skeleton and in much of the proximal appendicular skeleton, without focal lesions identified and without typical punched-out cortical lesions often encountered in the setting of myeloma. The appearance on today's exam could be from granulocyte stimulation (correlate with any history of minute stray shin of granulocyte stimulating pharmaceuticals) or CT occult marrow infiltration. Incidental CT findings: Mild degenerative anterolisthesis of L5 on S1. EXTREMITIES: No significant abnormal hypermetabolic  activity in this region. Incidental CT findings: Subcutaneous edema along the heels. IMPRESSION: 1. A 03.1 by 1.2 cm ground-glass density in the left upper lobe is notably hypermetabolic with maximum SUV 11.4, and  a smaller focus of subpleural ground-glass opacity in the left upper lobe is mildly hypermetabolic. Possibilities may include lepidic growth lung cancer or atypical infectious process. Tissue sampling may be warranted. 2. Generalized accentuated marrow activity in the axial skeleton and proximal appendicular skeleton, symmetric and without focal lesions on the CT data. This could be from granulocyte stimulation (correlate with any medication aimed at granulocyte stimulation) or CT occult marrow infiltrative process. 3. Other imaging findings of potential clinical significance: Chronic right maxillary sinusitis. Aortic Atherosclerosis (ICD10-I70.0). Sigmoid colon diverticulosis. Pelvic floor laxity with cystocele. Electronically Signed   By: Van Clines M.D.   On: 05/17/2020 13:07   CT Biopsy  Result Date: 05/20/2020 INDICATION: 60 year old female with new diagnosis of multiple myeloma. Evaluate marrow involvement for staging. EXAM: CT GUIDED BONE MARROW ASPIRATION AND CORE BIOPSY Interventional Radiologist:  Criselda Peaches, MD MEDICATIONS: None. ANESTHESIA/SEDATION: Moderate (conscious) sedation was employed during this procedure. A total of 2 milligrams versed and 100 micrograms fentanyl were administered intravenously. The patient's level of consciousness and vital signs were monitored continuously by radiology nursing throughout the procedure under my direct supervision. Total monitored sedation time: 10 minutes FLUOROSCOPY TIME:  None. COMPLICATIONS: None immediate. Estimated blood loss: <25 mL PROCEDURE: Informed written consent was obtained from the patient after a thorough discussion of the procedural risks, benefits and alternatives. All questions were addressed. Maximal Sterile Barrier Technique was utilized including caps, mask, sterile gowns, sterile gloves, sterile drape, hand hygiene and skin antiseptic. A timeout was performed prior to the initiation of the procedure. The patient was  positioned prone and non-contrast localization CT was performed of the pelvis to demonstrate the iliac marrow spaces. Maximal barrier sterile technique utilized including caps, mask, sterile gowns, sterile gloves, large sterile drape, hand hygiene, and betadine prep. Under sterile conditions and local anesthesia, an 11 gauge coaxial bone biopsy needle was advanced into the right iliac marrow space. Needle position was confirmed with CT imaging. Initially, bone marrow aspiration was performed. Next, the 11 gauge outer cannula was utilized to obtain a right iliac bone marrow core biopsy. Needle was removed. Hemostasis was obtained with compression. The patient tolerated the procedure well. Samples were prepared with the cytotechnologist. IMPRESSION: Technically successful CT-guided bone marrow aspiration and core biopsy of the right iliac bone. Electronically Signed   By: Jacqulynn Cadet M.D.   On: 05/20/2020 12:35   DG Chest Port 1 View  Result Date: 06/09/2020 CLINICAL DATA:  Fever and shortness of breath. EXAM: PORTABLE CHEST 1 VIEW COMPARISON:  None. FINDINGS: The cardiomediastinal silhouette is unremarkable. A RIGHT Port-A-Cath is noted with tip overlying the LOWER SVC. There is no evidence of focal airspace disease, pulmonary edema, suspicious pulmonary nodule/mass, pleural effusion, or pneumothorax. No acute bony abnormalities are identified. IMPRESSION: No active disease. Electronically Signed   By: Margarette Canada M.D.   On: 06/09/2020 13:03   CT BONE MARROW BIOPSY & ASPIRATION  Result Date: 05/20/2020 INDICATION: 60 year old female with new diagnosis of multiple myeloma. Evaluate marrow involvement for staging. EXAM: CT GUIDED BONE MARROW ASPIRATION AND CORE BIOPSY Interventional Radiologist:  Criselda Peaches, MD MEDICATIONS: None. ANESTHESIA/SEDATION: Moderate (conscious) sedation was employed during this procedure. A total of 2 milligrams versed and 100 micrograms fentanyl were administered  intravenously. The patient's level of consciousness and  vital signs were monitored continuously by radiology nursing throughout the procedure under my direct supervision. Total monitored sedation time: 10 minutes FLUOROSCOPY TIME:  None. COMPLICATIONS: None immediate. Estimated blood loss: <25 mL PROCEDURE: Informed written consent was obtained from the patient after a thorough discussion of the procedural risks, benefits and alternatives. All questions were addressed. Maximal Sterile Barrier Technique was utilized including caps, mask, sterile gowns, sterile gloves, sterile drape, hand hygiene and skin antiseptic. A timeout was performed prior to the initiation of the procedure. The patient was positioned prone and non-contrast localization CT was performed of the pelvis to demonstrate the iliac marrow spaces. Maximal barrier sterile technique utilized including caps, mask, sterile gowns, sterile gloves, large sterile drape, hand hygiene, and betadine prep. Under sterile conditions and local anesthesia, an 11 gauge coaxial bone biopsy needle was advanced into the right iliac marrow space. Needle position was confirmed with CT imaging. Initially, bone marrow aspiration was performed. Next, the 11 gauge outer cannula was utilized to obtain a right iliac bone marrow core biopsy. Needle was removed. Hemostasis was obtained with compression. The patient tolerated the procedure well. Samples were prepared with the cytotechnologist. IMPRESSION: Technically successful CT-guided bone marrow aspiration and core biopsy of the right iliac bone. Electronically Signed   By: Jacqulynn Cadet M.D.   On: 05/20/2020 12:35   IR IMAGING GUIDED PORT INSERTION  Result Date: 06/05/2020 INDICATION: 60 year old female with history of multiple myeloma requiring central venous access for chemotherapy. EXAM: IMPLANTED PORT A CATH PLACEMENT WITH ULTRASOUND AND FLUOROSCOPIC GUIDANCE COMPARISON:  None. MEDICATIONS: None. ANESTHESIA/SEDATION:  Moderate (conscious) sedation was employed during this procedure. A total of Versed 4 mg and Fentanyl 100 mcg was administered intravenously. Moderate Sedation Time: 15 minutes. The patient's level of consciousness and vital signs were monitored continuously by radiology nursing throughout the procedure under my direct supervision. CONTRAST:  None FLUOROSCOPY TIME:  0 minutes, 6 seconds (1 mGy) COMPLICATIONS: None immediate. PROCEDURE: The procedure, risks, benefits, and alternatives were explained to the patient. Questions regarding the procedure were encouraged and answered. The patient understands and consents to the procedure. The right neck and chest were prepped with chlorhexidine in a sterile fashion, and a sterile drape was applied covering the operative field. Maximum barrier sterile technique with sterile gowns and gloves were used for the procedure. A timeout was performed prior to the initiation of the procedure. Ultrasound was used to examine the jugular vein which was compressible and free of internal echoes. A skin marker was used to demarcate the planned venotomy and port pocket incision sites. Local anesthesia was provided to these sites and the subcutaneous tunnel track with 1% lidocaine with 1:100,000 epinephrine. A small incision was created at the jugular access site and blunt dissection was performed of the subcutaneous tissues. Under real time ultrasound guidance, the jugular vein was accessed with a 21 ga micropuncture needle and an 0.018" wire was inserted to the superior vena cava. A 5 Fr micopuncture set was then used, through which a 0.035" Rosen wire was passed under fluoroscopic guidance into the inferior vena cava. An 8 Fr dilator was then placed over the wire. A subcutaneous port pocket was then created along the upper chest wall utilizing a combination of sharp and blunt dissection. The pocket was irrigated with sterile saline, packed with gauze, and observed for hemorrhage. A single  lumen "ISP" sized power injectable port was chosen for placement. The 8 Fr catheter was tunneled from the port pocket site to the venotomy incision.  The port was placed in the pocket. The external catheter was trimmed to appropriate length. The dilator was exchanged for an 8 Fr peel-away sheath under fluoroscopic guidance. The catheter was then placed through the sheath and the sheath was removed. Final catheter positioning was confirmed and documented with a fluoroscopic spot radiograph. The port was accessed with a Huber needle, aspirated, and flushed with heparinized saline. The deep dermal layer of the port pocket incision was closed with interrupted 3-0 Vicryl suture. The skin was opposed with a running subcuticular 4-0 Monocryl suture. Dermabond was then placed over the port pocket and neck incisions. The patient tolerated the procedure well without immediate post procedural complication. FINDINGS: After catheter placement, the tip lies within the cavoatrial junction the catheter aspirates and flushes normally and is ready for immediate use. IMPRESSION: Successful placement of a power injectable Port-A-Cath via the right internal jugular vein. The catheter is ready for immediate use. Ruthann Cancer, MD [facility] Rad[facility] Electronically Signed   By: Ruthann Cancer MD   On: 06/05/2020 15:51    ASSESSMENT AND PLAN: 60 year old female with  #1 Newly diagnosed multiple myeloma  RISS Stage II, IgA kappa Presenting with anemia and renal insufficiency - no hypercalcemia Bone survey with no obvious skeletal lesions.  Baseline M spike of 4.7 g/dL.+ additional spike of 0.3g/dl Beta-2 Microglobulin at 2.5 Albumin 2 Bone marrow biopsy-showing 60 to 70% abnormal plasma cells which are kappa restricted consistent with multiple myeloma. Molecular cytogenetics-complex karyotype would duplication 1 q., monosomy 13, deletions of 14 q. and 16 q.  #2 anemia likely  related to multiple myeloma.  Ferritin adequate B12 levels low normal.  #3 renal insufficiency creatinine 1.28 likely some element of renal insufficiency from myeloma.  #4 Fever of unknown origin  #5 Diarrhea, resolved - c diff antigen positive, c diff toxin negative -- no diarrhea currently.  PLAN:  Patient is continuing to have fevers.  Unlikely to be drug fevers at this time. No overt culture positivity. Talk to the nurse at bedside to send out 1 set of blood cultures from the port and one set of peripheral blood cultures. If having diarrhea would repeat C. difficile testing. Agree with infectious disease consultation. On antibiotics as per hospital medicine. We shall hold off on her 3/10 on 3/11 planned carfilzomib treatments. We have ordered 1 unit of PRBC for symptomatic anemia with the patient's informed consent. Oncology will be available for questions Patient and husband updated at bedside.   Sullivan Lone

## 2020-06-13 NOTE — Telephone Encounter (Signed)
Added provider visit to upcoming appointment. Patient is aware of changes.

## 2020-06-13 NOTE — Progress Notes (Signed)
   06/12/20 1345  Assess: MEWS Score  Temp (!) 103 F (39.4 C)  Assess: MEWS Score  MEWS Temp 2  MEWS Systolic 0  MEWS Pulse 0  MEWS RR 0  MEWS LOC 0  MEWS Score 2  MEWS Score Color Yellow  Assess: if the MEWS score is Yellow or Red  Were vital signs taken at a resting state? Yes  Focused Assessment No change from prior assessment  Early Detection of Sepsis Score *See Row Information* High  MEWS guidelines implemented *See Row Information* No, previously yellow, continue vital signs every 4 hours  Treat  MEWS Interventions Administered scheduled meds/treatments  Pain Scale 0-10  Pain Score 0  Take Vital Signs  Increase Vital Sign Frequency  Yellow: Q 2hr X 2 then Q 4hr X 2, if remains yellow, continue Q 4hrs  Escalate  MEWS: Escalate Yellow: discuss with charge nurse/RN and consider discussing with provider and RRT  Notify: Charge Nurse/RN  Name of Charge Nurse/RN Notified Kim RN  Date Charge Nurse/RN Notified 06/12/20  Time Charge Nurse/RN Notified 1345  Notify: Provider  Provider Name/Title ramesh  Date Provider Notified 06/12/20  Time Provider Notified 1345  Notification Type Page  Notification Reason Other (Comment) (temp 103)  Provider response See new orders  Date of Provider Response 06/12/20  Time of Provider Response 1347

## 2020-06-13 NOTE — Progress Notes (Signed)
Megan Velasquez for Infectious Disease   Reason for visit: Follow up on fever  Interval History: last fever yesterday afternoon, prior to consult, WBC wnl.  Remains off of antibiotics.  No new complaints.  No diarrhea today   Physical Exam: Constitutional:  Vitals:   06/13/20 0048 06/13/20 0608  BP: 118/66 126/74  Pulse: 69 72  Resp: 14 14  Temp: (!) 97.5 F (36.4 C) 97.7 F (36.5 C)  SpO2: 100% 100%   patient appears in NAD HENT: no thrush Respiratory: Normal respiratory effort; CTA B Cardiovascular: RRR GI: soft, nt, nd  Review of Systems: Constitutional: negative for chills and sweats Gastrointestinal: negative for nausea Integument/breast: negative for rash  Lab Results  Component Value Date   WBC 9.4 06/13/2020   HGB 9.5 (L) 06/13/2020   HCT 30.2 (L) 06/13/2020   MCV 85.3 06/13/2020   PLT 93 (L) 06/13/2020    Lab Results  Component Value Date   CREATININE 0.58 06/13/2020   BUN 15 06/13/2020   NA 136 06/13/2020   K 3.9 06/13/2020   CL 106 06/13/2020   CO2 22 06/13/2020    Lab Results  Component Value Date   ALT 43 06/13/2020   AST 23 06/13/2020   ALKPHOS 38 06/13/2020     Microbiology: Recent Results (from the past 240 hour(s))  Resp Panel by RT-PCR (Flu A&B, Covid) Nasopharyngeal Swab     Status: None   Collection Time: 06/09/20 12:08 PM   Specimen: Nasopharyngeal Swab; Nasopharyngeal(NP) swabs in vial transport medium  Result Value Ref Range Status   SARS Coronavirus 2 by RT PCR NEGATIVE NEGATIVE Final    Comment: (NOTE) SARS-CoV-2 target nucleic acids are NOT DETECTED.  The SARS-CoV-2 RNA is generally detectable in upper respiratory specimens during the acute phase of infection. The lowest concentration of SARS-CoV-2 viral copies this assay can detect is 138 copies/mL. A negative result does not preclude SARS-Cov-2 infection and should not be used as the sole basis for treatment or other patient management decisions. A negative result  may occur with  improper specimen collection/handling, submission of specimen other than nasopharyngeal swab, presence of viral mutation(s) within the areas targeted by this assay, and inadequate number of viral copies(<138 copies/mL). A negative result must be combined with clinical observations, patient history, and epidemiological information. The expected result is Negative.  Fact Sheet for Patients:  EntrepreneurPulse.com.au  Fact Sheet for Healthcare Providers:  IncredibleEmployment.be  This test is no t yet approved or cleared by the [place] FDA and  has been authorized for detection and/or diagnosis of SARS-CoV-2 by FDA under an Emergency Use Authorization (EUA). This EUA will remain  in effect (meaning this test can be used) for the duration of the COVID-19 declaration under Section 564(b)(1) of the Act, 21 U.S.C.section 360bbb-3(b)(1), unless the authorization is terminated  or revoked sooner.       Influenza A by PCR NEGATIVE NEGATIVE Final   Influenza B by PCR NEGATIVE NEGATIVE Final    Comment: (NOTE) The Xpert Xpress SARS-CoV-2/FLU/RSV plus assay is intended as an aid in the diagnosis of influenza from Nasopharyngeal swab specimens and should not be used as a sole basis for treatment. Nasal washings and aspirates are unacceptable for Xpert Xpress SARS-CoV-2/FLU/RSV testing.  Fact Sheet for Patients: EntrepreneurPulse.com.au  Fact Sheet for Healthcare Providers: IncredibleEmployment.be  This test is not yet approved or cleared by the [place] FDA and has been authorized for detection and/or diagnosis of SARS-CoV-2 by FDA under an  Emergency Use Authorization (EUA). This EUA will remain in effect (meaning this test can be used) for the duration of the COVID-19 declaration under Section 564(b)(1) of the Act, 21 U.S.C. section 360bbb-3(b)(1), unless the authorization is terminated  or revoked.  Performed at [facility], [place] [address] [place], [place] [address]   Blood Culture (routine x 2)     Status: None (Preliminary result)   Collection Time: 06/09/20 12:08 PM   Specimen: BLOOD  Result Value Ref Range Status   Specimen Description   Final    BLOOD BLOOD LEFT HAND Performed at [facility] [address], [place], [place] [address]    Special Requests   Final    BOTTLES DRAWN AEROBIC AND ANAEROBIC Blood Culture adequate volume Performed at [facility] [address] [place], [place] [address]    Culture   Final    NO GROWTH 3 DAYS Performed at [facility], [place] [address] [place], [place] [address]    Report Status PENDING  Incomplete  Urine culture     Status: None   Collection Time: 06/09/20 12:08 PM   Specimen: In/Out Cath Urine  Result Value Ref Range Status   Specimen Description   Final    IN/OUT CATH URINE Performed at [facility] [address] [place], [place] [address]    Special Requests   Final    NONE Performed at [facility], [place] [address] [place], [place] [address]    Culture   Final    NO GROWTH Performed at [facility], [place] [address], [place], [place] [address]    Report Status 06/10/2020 FINAL  Final  Blood Culture (routine x 2)     Status: None (Preliminary result)   Collection Time: 06/09/20 12:13 PM   Specimen: BLOOD  Result Value Ref Range Status   Specimen Description   Final    BLOOD LEFT ANTECUBITAL Performed at [facility] [address], [place], [place] [address]    Special Requests   Final    BOTTLES DRAWN AEROBIC AND ANAEROBIC Blood Culture adequate volume Performed at [place] [address], [place], [place] [address]    Culture   Final    NO GROWTH 3 DAYS Performed at [facility], [place] [address], [place], [place] [address]    Report  Status PENDING  Incomplete  Gastrointestinal Panel by PCR , Stool     Status: None   Collection Time: 06/09/20  7:00 PM   Specimen: Stool  Result Value Ref Range Status   Campylobacter species NOT DETECTED NOT DETECTED Final   Plesimonas shigelloides NOT DETECTED NOT DETECTED Final   Salmonella species NOT DETECTED NOT DETECTED Final   Yersinia enterocolitica NOT DETECTED NOT DETECTED Final   Vibrio species NOT DETECTED NOT DETECTED Final   Vibrio cholerae NOT DETECTED NOT DETECTED Final   Enteroaggregative E coli (EAEC) NOT DETECTED NOT DETECTED Final   Enteropathogenic E coli (EPEC) NOT DETECTED NOT DETECTED Final   Enterotoxigenic E coli (ETEC) NOT DETECTED NOT DETECTED Final   Shiga like toxin producing E coli (STEC) NOT DETECTED NOT DETECTED Final   Shigella/Enteroinvasive E coli (EIEC) NOT DETECTED NOT DETECTED Final   Cryptosporidium NOT DETECTED NOT DETECTED Final   Cyclospora cayetanensis NOT DETECTED NOT DETECTED Final   Entamoeba histolytica NOT DETECTED NOT DETECTED Final   Giardia lamblia NOT DETECTED NOT DETECTED Final  Adenovirus F40/41 NOT DETECTED NOT DETECTED Final   Astrovirus NOT DETECTED NOT DETECTED Final   Norovirus GI/GII NOT DETECTED NOT DETECTED Final   Rotavirus A NOT DETECTED NOT DETECTED Final   Sapovirus (I, II, IV, and V) NOT DETECTED NOT DETECTED Final    Comment: Performed at [facility], [place], [place] [address]  C Difficile Quick Screen w PCR reflex     Status: Abnormal   Collection Time: 06/09/20  7:00 PM   Specimen: Stool  Result Value Ref Range Status   C Diff antigen POSITIVE (A) NEGATIVE Final   C Diff toxin NEGATIVE NEGATIVE Final   C Diff interpretation Results are indeterminate. See PCR results.  Final    Comment: Performed at [facility], [place] [address] [place], [place] [address]  MRSA PCR Screening     Status: None   Collection Time: 06/09/20  8:00 PM   Specimen: Nasopharyngeal  Result  Value Ref Range Status   MRSA by PCR NEGATIVE NEGATIVE Final    Comment:        The GeneXpert MRSA Assay (FDA approved for NASAL specimens only), is one component of a comprehensive MRSA colonization surveillance program. It is not intended to diagnose MRSA infection nor to guide or monitor treatment for MRSA infections. Performed at [facility], [place] [address] [place], [place] [address]     Impression/Plan:  1. Fever - trending down with no signs of active infection.  Remains off of antibiotics and no concerns.  May be drug related and wearing off.   Continue to observe off of antibiotics.  2.  Leukocytosis - resolved.  May have been related to steroids.    I will sign off, call with questions

## 2020-06-14 ENCOUNTER — Ambulatory Visit: Payer: BC Managed Care – PPO

## 2020-06-14 LAB — CULTURE, BLOOD (ROUTINE X 2)
Culture: NO GROWTH
Culture: NO GROWTH
Special Requests: ADEQUATE
Special Requests: ADEQUATE

## 2020-06-14 LAB — BASIC METABOLIC PANEL
Anion gap: 11 (ref 5–15)
BUN: 20 mg/dL (ref 6–20)
CO2: 23 mmol/L (ref 22–32)
Calcium: 7.9 mg/dL — ABNORMAL LOW (ref 8.9–10.3)
Chloride: 105 mmol/L (ref 98–111)
Creatinine, Ser: 0.69 mg/dL (ref 0.44–1.00)
GFR, Estimated: >60 mL/min (ref >60)
Glucose, Bld: 89 mg/dL (ref 70–99)
Potassium: 3.8 mmol/L (ref 3.5–5.1)
Sodium: 139 mmol/L (ref 135–145)

## 2020-06-14 LAB — CBC
HCT: 29.8 % — ABNORMAL LOW (ref 36.0–46.0)
Hemoglobin: 9.2 g/dL — ABNORMAL LOW (ref 12.0–15.0)
MCH: 26.5 pg (ref 26.0–34.0)
MCHC: 30.9 g/dL (ref 30.0–36.0)
MCV: 85.9 fL (ref 80.0–100.0)
Platelets: 106 10*3/uL — ABNORMAL LOW (ref 150–400)
RBC: 3.47 MIL/uL — ABNORMAL LOW (ref 3.87–5.11)
RDW: 19.9 % — ABNORMAL HIGH (ref 11.5–15.5)
WBC: 5 10*3/uL (ref 4.0–10.5)
nRBC: 0 % (ref 0.0–0.2)

## 2020-06-14 NOTE — Progress Notes (Signed)
Oncology short note  Patients chart reviewed. Discharging home today per hospital medicine. Has Oncology f/u and rx in 1 week   Megan Velasquez

## 2020-06-14 NOTE — Discharge Summary (Signed)
Physician Discharge Summary  Maud Rubendall KYH:[id number] DOB: 1961-04-02 DOA: 06/09/2020  PCP: [name], MD  Admit date: 06/09/2020 Discharge date: 06/14/2020  Admitted From: home Disposition:  home  Recommendations for Outpatient Follow-up:  1. Follow up with PCP and hem-oncology in 1-2 weeks  Home Health:no  Equipment/Devices: none  Discharge Condition: Stable Code Status:   Code Status: Full Code Diet recommendation:  Diet Order            Diet - low sodium heart healthy           Diet regular Room service appropriate? Yes; Fluid consistency: Thin  Diet effective now                 Brief/Interim Summary: 60 year old female with history of multiple myeloma, GERD, hypertension, Port-A-Cath placed 4 days prior to admission and placed on chemo last Thursday and Friday subsequently developed fever malaise fatigue and admitted for fever of unknown origin.  Chest x-ray unremarkable UA unremarkable placed on IV antibiotics and admitted.  Patient has had diarrhea-C. difficile antigen positive but toxin negative, GI panel negative. Covid negative blood culture from 3/6 - so far. At this time patient is asymptomatic.  Has had solid bowel movement, no new complaints. She is being discharged home in medically stable condition with instruction to follow-up with PCP and or oncology. I discussed with oncology team today and okay for discharge home  Discharge Diagnoses:   FUO :Chest x-ray unremarkable UA unremarkable. was placed on IV antibiotics and admitted. Covid negative blood culture from 3/6 - so far.  CT chest no acute finding.Followed by hematology.She has had no obvious infection and no neutropenia- antibiotic has been discontinued by ID 06/12/20- could be viral infection versus medication versus chemo induced.  Doing well without antibiotics remains afebrile no nausea vomiting chest pain..  C. difficile antigen positive but toxin negative, reviewed by ID since patient is not  symptomatic less likely active infection.GI panel negative.  She has had solid bowel movement.  Shortness of breath/dyspnea with minimal activity: No PE on CTA.  Likely component of anxiety anemia I continue her Atarax.  Dehydration: HCTZ discontinued.  Encourage p.o.  Hypokalemia/hypomagnesemia and hypophosphatemia: Stable electrolytes.holding hctz.    Anemia likely in the setting of anemia of chronic disease and malignancy: Status post 1 unit PRBC transfusion 3/9.  Anemia panel shows high ferritin, iron level 13, T51 folic acid normal.Defer IV iron to hematology-which will be outpatient.  Hemoglobin has remained stable after transfusion Recent Labs  Lab 06/10/20 0957 06/11/20 0537 06/12/20 0528 06/13/20 0728 06/14/20 0559  HGB 8.6* 8.2* 7.6* 9.5* 9.2*  HCT 27.2* 26.2* 25.1* 30.2* 29.8*   Multiple myeloma not having achieved remission:On induction therapy with carfilzomib/Revlimid/dexamethasone. She is also on aspirin and Eliquis due to Revlimid.On prophylactic Acyclovir since being on chemo.  Oncology monitoring.  GERD: Continue PPI  Essential hypertension:  Controlled continue lisinopril.  HCTZ discontinued for now.  Follow-up with PCP.   Consults:  ID HEM ONC  Subjective: Aaox3,solid BM.  Discharge Exam: Vitals:   06/13/20 2201 06/14/20 0454  BP: 130/67 133/73  Pulse: 77 68  Resp: 14 14  Temp: 98.1 F (36.7 C) 98 F (36.7 C)  SpO2: 99% 96%   General: Pt is alert, awake, not in acute distress Cardiovascular: RRR, S1/S2 +, no rubs, no gallops Respiratory: CTA bilaterally, no wheezing, no rhonchi Abdominal: Soft, NT, ND, bowel sounds + Extremities: no edema, no cyanosis  Discharge Instructions  Discharge Instructions  Informed Consent Details: Physician/Practitioner Attestation; Transcribe to consent form and obtain patient signature   Complete by: Jun 12, 2020    Physician/Practitioner attestation of informed consent for blood and or blood product  transfusion: I, the physician/practitioner, attest that I have discussed with the patient the benefits, risks, side effects, alternatives, likelihood of achieving goals and potential problems during recovery for the procedure that I have provided informed consent.   Product(s): All Product(s)   Care order/instruction   Complete by: As directed    Transfuse Parameters   Diet - low sodium heart healthy   Complete by: As directed    Discharge instructions   Complete by: As directed    Please call call MD or return to ER for similar or worsening recurring problem that brought you to hospital or if any fever,nausea/vomiting,abdominal pain, uncontrolled pain, chest pain,  shortness of breath or any other alarming symptoms.  Please follow-up your doctor as instructed in a week time and GET cbc/bmp  Call the office for appointment.  Please avoid alcohol, smoking, or any other illicit substance and maintain healthy habits including taking your regular medications as prescribed.  You were cared for by a hospitalist during your hospital stay. If you have any questions about your discharge medications or the care you received while you were in the hospital after you are discharged, you can call the unit and ask to speak with the hospitalist on call if the hospitalist that took care of you is not available.  Once you are discharged, your primary care physician will handle any further medical issues. Please note that NO REFILLS for any discharge medications will be authorized once you are discharged, as it is imperative that you return to your primary care physician (or establish a relationship with a primary care physician if you do not have one) for your aftercare needs so that they can reassess your need for medications and monitor your lab values   Discharge instructions   Complete by: As directed    Please call call MD or return to ER for similar or worsening recurring problem that brought you to  hospital or if any fever,nausea/vomiting,abdominal pain, uncontrolled pain, chest pain,  shortness of breath or any other alarming symptoms.  Please follow-up your doctor as instructed in a week time and call the office for appointment.  Please avoid alcohol, smoking, or any other illicit substance and maintain healthy habits including taking your regular medications as prescribed.  You were cared for by a hospitalist during your hospital stay. If you have any questions about your discharge medications or the care you received while you were in the hospital after you are discharged, you can call the unit and ask to speak with the hospitalist on call if the hospitalist that took care of you is not available.  Once you are discharged, your primary care physician will handle any further medical issues. Please note that NO REFILLS for any discharge medications will be authorized once you are discharged, as it is imperative that you return to your primary care physician (or establish a relationship with a primary care physician if you do not have one) for your aftercare needs so that they can reassess your need for medications and monitor your lab values   Increase activity slowly   Complete by: As directed    Increase activity slowly   Complete by: As directed    Type and screen   Complete by: As directed    [place]      Allergies as of 06/14/2020   No Known Allergies     Medication List    STOP taking these medications   lisinopril-hydrochlorothiazide 10-12.5 MG tablet Commonly known as: ZESTORETIC     TAKE these medications   acetaminophen 500 MG tablet Commonly known as: TYLENOL Take 1,000 mg by mouth every 6 (six) hours as needed for fever or headache.   acyclovir 400 MG tablet Commonly known as: ZOVIRAX Take 1 tablet (400 mg total) by mouth 2 (two) times daily.   apixaban 2.5 MG Tabs tablet Commonly known as: Eliquis Take 1 tablet (2.5 mg total) by  mouth 2 (two) times daily.   b complex vitamins capsule Take 1 capsule by mouth daily. What changed: when to take this   dexamethasone 4 MG tablet Commonly known as: DECADRON Take 10 tablets (70m) once on day 22. Repeat every 28 days for the first 4 cycles. Take with breakfast. What changed:   how much to take  how to take this  when to take this  additional instructions   lenalidomide 15 MG capsule Commonly known as: Revlimid Take 1 capsule (15 mg total) by mouth daily. Take for 21 days. Hold 7 days. Repeat every 28 days. [employer] aJosem Kaufmann##[id number] Date obtained 05/29/20 What changed:   when to take this  additional instructions   lidocaine-prilocaine cream Commonly known as: EMLA Apply to affected area once What changed:   how much to take  how to take this  when to take this  reasons to take this  additional instructions   loratadine 10 MG tablet Commonly known as: CLARITIN Take 10 mg by mouth every morning.   LORazepam 0.5 MG tablet Commonly known as: Ativan Take 1 tablet (0.5 mg total) by mouth every 6 (six) hours as needed (Nausea or vomiting).   omeprazole 40 MG capsule Commonly known as: PRILOSEC Take 40 mg by mouth daily.   ondansetron 8 MG tablet Commonly known as: Zofran Take 1 tablet (8 mg total) by mouth 2 (two) times daily as needed (Nausea or vomiting).   prochlorperazine 10 MG tablet Commonly known as: COMPAZINE Take 1 tablet (10 mg total) by mouth every 6 (six) hours as needed (Nausea or vomiting).   Vitamin B12 3000 MCG Subl Place 3,000 mcg under the tongue every morning.   vitamin C 1000 MG tablet Take 1,000 mg by mouth every morning.   Vitamin D3 125 MCG (5000 UT) Caps Take 5,000 Units by mouth every morning.       No Known Allergies  The results of significant diagnostics from this hospitalization (including imaging, microbiology, ancillary and laboratory) are listed below for reference.    Microbiology: Recent Results  (from the past 240 hour(s))  Resp Panel by RT-PCR (Flu A&B, Covid) Nasopharyngeal Swab     Status: None   Collection Time: 06/09/20 12:08 PM   Specimen: Nasopharyngeal Swab; Nasopharyngeal(NP) swabs in vial transport medium  Result Value Ref Range Status   SARS Coronavirus 2 by RT PCR NEGATIVE NEGATIVE Final    Comment: (NOTE) SARS-CoV-2 target nucleic acids are NOT DETECTED.  The SARS-CoV-2 RNA is generally detectable in upper respiratory specimens during the acute phase of infection. The lowest concentration of SARS-CoV-2 viral copies this assay can detect is 138 copies/mL. A negative result does not preclude SARS-Cov-2 infection and should not be used as the sole basis for treatment or other patient management decisions. A negative result may occur with  improper specimen collection/handling,  submission of specimen other than nasopharyngeal swab, presence of viral mutation(s) within the areas targeted by this assay, and inadequate number of viral copies(<138 copies/mL). A negative result must be combined with clinical observations, patient history, and epidemiological information. The expected result is Negative.  Fact Sheet for Patients:  EntrepreneurPulse.com.au  Fact Sheet for Healthcare Providers:  IncredibleEmployment.be  This test is no t yet approved or cleared by the [place] FDA and  has been authorized for detection and/or diagnosis of SARS-CoV-2 by FDA under an Emergency Use Authorization (EUA). This EUA will remain  in effect (meaning this test can be used) for the duration of the COVID-19 declaration under Section 564(b)(1) of the Act, 21 U.S.C.section 360bbb-3(b)(1), unless the authorization is terminated  or revoked sooner.       Influenza A by PCR NEGATIVE NEGATIVE Final   Influenza B by PCR NEGATIVE NEGATIVE Final    Comment: (NOTE) The Xpert Xpress SARS-CoV-2/FLU/RSV plus assay is intended as an aid in the  diagnosis of influenza from Nasopharyngeal swab specimens and should not be used as a sole basis for treatment. Nasal washings and aspirates are unacceptable for Xpert Xpress SARS-CoV-2/FLU/RSV testing.  Fact Sheet for Patients: EntrepreneurPulse.com.au  Fact Sheet for Healthcare Providers: IncredibleEmployment.be  This test is not yet approved or cleared by the [place] FDA and has been authorized for detection and/or diagnosis of SARS-CoV-2 by FDA under an Emergency Use Authorization (EUA). This EUA will remain in effect (meaning this test can be used) for the duration of the COVID-19 declaration under Section 564(b)(1) of the Act, 21 U.S.C. section 360bbb-3(b)(1), unless the authorization is terminated or revoked.  Performed at [facility], [place] [address], [place], [place] [address]   Blood Culture (routine x 2)     Status: None (Preliminary result)   Collection Time: 06/09/20 12:08 PM   Specimen: BLOOD  Result Value Ref Range Status   Specimen Description   Final    BLOOD BLOOD LEFT HAND Performed at [place] [address] [place], [place] [address]    Special Requests   Final    BOTTLES DRAWN AEROBIC AND ANAEROBIC Blood Culture adequate volume Performed at [place] [address], [place], [place] [address]    Culture   Final    NO GROWTH 4 DAYS Performed at [facility], [place] [address], [place], [place] [address]    Report Status PENDING  Incomplete  Urine culture     Status: None   Collection Time: 06/09/20 12:08 PM   Specimen: In/Out Cath Urine  Result Value Ref Range Status   Specimen Description   Final    IN/OUT CATH URINE Performed at [place] [address], [place], [place] [address]    Special Requests   Final    NONE Performed at [facility], [place] [address], [place], [place] [address]     Culture   Final    NO GROWTH Performed at [facility], [place] [address], [place], [place] [address]    Report Status 06/10/2020 FINAL  Final  Blood Culture (routine x 2)     Status: None (Preliminary result)   Collection Time: 06/09/20 12:13 PM   Specimen: BLOOD  Result Value Ref Range Status   Specimen Description   Final    BLOOD LEFT ANTECUBITAL Performed at [place] [address] [place], [place] [address]    Special Requests  Final    BOTTLES DRAWN AEROBIC AND ANAEROBIC Blood Culture adequate volume Performed at [place] [address], [place], [place] [address]    Culture   Final    NO GROWTH 4 DAYS Performed at [facility], [place] [address], [place], [place] [address]    Report Status PENDING  Incomplete  Gastrointestinal Panel by PCR , Stool     Status: None   Collection Time: 06/09/20  7:00 PM   Specimen: Stool  Result Value Ref Range Status   Campylobacter species NOT DETECTED NOT DETECTED Final   Plesimonas shigelloides NOT DETECTED NOT DETECTED Final   Salmonella species NOT DETECTED NOT DETECTED Final   Yersinia enterocolitica NOT DETECTED NOT DETECTED Final   Vibrio species NOT DETECTED NOT DETECTED Final   Vibrio cholerae NOT DETECTED NOT DETECTED Final   Enteroaggregative E coli (EAEC) NOT DETECTED NOT DETECTED Final   Enteropathogenic E coli (EPEC) NOT DETECTED NOT DETECTED Final   Enterotoxigenic E coli (ETEC) NOT DETECTED NOT DETECTED Final   Shiga like toxin producing E coli (STEC) NOT DETECTED NOT DETECTED Final   Shigella/Enteroinvasive E coli (EIEC) NOT DETECTED NOT DETECTED Final   Cryptosporidium NOT DETECTED NOT DETECTED Final   Cyclospora cayetanensis NOT DETECTED NOT DETECTED Final   Entamoeba histolytica NOT DETECTED NOT DETECTED Final   Giardia lamblia NOT DETECTED NOT DETECTED Final   Adenovirus F40/41 NOT DETECTED NOT DETECTED Final   Astrovirus NOT DETECTED NOT DETECTED Final    Norovirus GI/GII NOT DETECTED NOT DETECTED Final   Rotavirus A NOT DETECTED NOT DETECTED Final   Sapovirus (I, II, IV, and V) NOT DETECTED NOT DETECTED Final    Comment: Performed at [facility], [place] [place], [place] [address]  C Difficile Quick Screen w PCR reflex     Status: Abnormal   Collection Time: 06/09/20  7:00 PM   Specimen: Stool  Result Value Ref Range Status   C Diff antigen POSITIVE (A) NEGATIVE Final   C Diff toxin NEGATIVE NEGATIVE Final   C Diff interpretation Results are indeterminate. See PCR results.  Final    Comment: Performed at [facility] [place] [address] [place], [place] [address]  MRSA PCR Screening     Status: None   Collection Time: 06/09/20  8:00 PM   Specimen: Nasopharyngeal  Result Value Ref Range Status   MRSA by PCR NEGATIVE NEGATIVE Final    Comment:        The GeneXpert MRSA Assay (FDA approved for NASAL specimens only), is one component of a comprehensive MRSA colonization surveillance program. It is not intended to diagnose MRSA infection nor to guide or monitor treatment for MRSA infections. Performed at [facility], [place] [address], [place],  [address]     Procedures/Studies: CT ANGIO CHEST PE W OR WO CONTRAST  Result Date: 06/12/2020 CLINICAL DATA:  Shortness of breath for 3 days EXAM: CT ANGIOGRAPHY CHEST WITH CONTRAST TECHNIQUE: Multidetector CT imaging of the chest was performed using the standard protocol during bolus administration of intravenous contrast. Multiplanar CT image reconstructions and MIPs were obtained to evaluate the vascular anatomy. CONTRAST:  57m OMNIPAQUE IOHEXOL 350 MG/ML SOLN COMPARISON:  Radiograph June 09, 2020 FINDINGS: Cardiovascular: There is a optimal opacification of the pulmonary arteries. There is no central,segmental, or subsegmental filling defects within the pulmonary arteries. The heart is normal in size. No pericardial effusion or  thickening. No evidence right heart strain. There is  normal three-vessel brachiocephalic anatomy without proximal stenosis. Scattered aortic atherosclerosis is noted. Mediastinum/Nodes: No hilar, mediastinal, or axillary adenopathy. Thyroid gland, trachea, and esophagus demonstrate no significant findings. Right-sided MediPort catheter seen with the tip at the superior cavoatrial junction. Lungs/Pleura: The lungs are clear. No pleural effusion or pneumothorax. No airspace consolidation. Upper Abdomen: No acute abnormalities present in the visualized portions of the upper abdomen. Diffuse low density seen throughout the right Musculoskeletal: No chest wall abnormality. No acute or significant osseous findings. Review of the MIP images confirms the above findings. IMPRESSION: No central, segmental, or subsegmental pulmonary embolism No acute intrathoracic pathology to explain the patient's symptoms Aortic Atherosclerosis (ICD10-I70.0). Electronically Signed   By: Prudencio Pair M.D.   On: 06/12/2020 12:10   NM PET Image Initial (PI) Whole Body  Result Date: 05/17/2020 CLINICAL DATA:  Initial treatment strategy for multiple myeloma. EXAM: NUCLEAR MEDICINE PET WHOLE BODY TECHNIQUE: 7.0 mCi F-18 FDG was injected intravenously. Full-ring PET imaging was performed from the head to foot after the radiotracer. CT data was obtained and used for attenuation correction and anatomic localization. Fasting blood glucose: 98 mg/dl COMPARISON:  Multiple exams, including CT chest report from 01/23/2019 FINDINGS: Mediastinal blood pool activity: SUV max 2.4 HEAD/NECK: No significant abnormal hypermetabolic activity in this region. Incidental CT findings: Chronic right maxillary sinusitis. Bilateral common carotid atherosclerotic calcification. CHEST: Sharply defined 3.1 by 1.2 cm ground-glass density lesion in the left upper lobe on image 93 of series 4 is hypermetabolic with maximum SUV 11.4. A smaller focus of subpleural  ground-glass opacity in the left upper lobe measuring 1.3 by 0.8 cm on image 103 of series 4 has a maximum SUV of 3.1. 0.6 by 0.4 cm left lower lobe nodule on image 115 of series 4, without appreciable accentuated metabolic activity. Subpleural ground-glass nodularity in the left lower lobe measuring 0.9 by 0.6 cm without hypermetabolic activity on image 35 series 8. Mildly focally accentuated distal esophageal activity with maximum SUV 4.3. This is most commonly physiologic at this location. Incidental CT findings: Atherosclerotic calcification of the aortic arch and branch vessels. ABDOMEN/PELVIS: No significant abnormal hypermetabolic activity in this region. Incidental CT findings: Aortoiliac atherosclerotic vascular disease. Cholecystectomy. Sigmoid colon diverticulosis. Pelvic floor laxity with cystocele. SKELETON: Diffusely accentuated marrow activity throughout the axial skeleton and in much of the proximal appendicular skeleton, without focal lesions identified and without typical punched-out cortical lesions often encountered in the setting of myeloma. The appearance on today's exam could be from granulocyte stimulation (correlate with any history of minute stray shin of granulocyte stimulating pharmaceuticals) or CT occult marrow infiltration. Incidental CT findings: Mild degenerative anterolisthesis of L5 on S1. EXTREMITIES: No significant abnormal hypermetabolic activity in this region. Incidental CT findings: Subcutaneous edema along the heels. IMPRESSION: 1. A 03.1 by 1.2 cm ground-glass density in the left upper lobe is notably hypermetabolic with maximum SUV 11.4, and a smaller focus of subpleural ground-glass opacity in the left upper lobe is mildly hypermetabolic. Possibilities may include lepidic growth lung cancer or atypical infectious process. Tissue sampling may be warranted. 2. Generalized accentuated marrow activity in the axial skeleton and proximal appendicular skeleton, symmetric and  without focal lesions on the CT data. This could be from granulocyte stimulation (correlate with any medication aimed at granulocyte stimulation) or CT occult marrow infiltrative process. 3. Other imaging findings of potential clinical significance: Chronic right maxillary sinusitis. Aortic Atherosclerosis (ICD10-I70.0). Sigmoid colon diverticulosis. Pelvic floor laxity with cystocele. Electronically Signed   By: Van Clines  M.D.   On: 05/17/2020 13:07   CT Biopsy  Result Date: 05/20/2020 INDICATION: 60 year old female with new diagnosis of multiple myeloma. Evaluate marrow involvement for staging. EXAM: CT GUIDED BONE MARROW ASPIRATION AND CORE BIOPSY Interventional Radiologist:  Criselda Peaches, MD MEDICATIONS: None. ANESTHESIA/SEDATION: Moderate (conscious) sedation was employed during this procedure. A total of 2 milligrams versed and 100 micrograms fentanyl were administered intravenously. The patient's level of consciousness and vital signs were monitored continuously by radiology nursing throughout the procedure under my direct supervision. Total monitored sedation time: 10 minutes FLUOROSCOPY TIME:  None. COMPLICATIONS: None immediate. Estimated blood loss: <25 mL PROCEDURE: Informed written consent was obtained from the patient after a thorough discussion of the procedural risks, benefits and alternatives. All questions were addressed. Maximal Sterile Barrier Technique was utilized including caps, mask, sterile gowns, sterile gloves, sterile drape, hand hygiene and skin antiseptic. A timeout was performed prior to the initiation of the procedure. The patient was positioned prone and non-contrast localization CT was performed of the pelvis to demonstrate the iliac marrow spaces. Maximal barrier sterile technique utilized including caps, mask, sterile gowns, sterile gloves, large sterile drape, hand hygiene, and betadine prep. Under sterile conditions and local anesthesia, an 11 gauge coaxial  bone biopsy needle was advanced into the right iliac marrow space. Needle position was confirmed with CT imaging. Initially, bone marrow aspiration was performed. Next, the 11 gauge outer cannula was utilized to obtain a right iliac bone marrow core biopsy. Needle was removed. Hemostasis was obtained with compression. The patient tolerated the procedure well. Samples were prepared with the cytotechnologist. IMPRESSION: Technically successful CT-guided bone marrow aspiration and core biopsy of the right iliac bone. Electronically Signed   By: Jacqulynn Cadet M.D.   On: 05/20/2020 12:35   DG Chest Port 1 View  Result Date: 06/09/2020 CLINICAL DATA:  Fever and shortness of breath. EXAM: PORTABLE CHEST 1 VIEW COMPARISON:  None. FINDINGS: The cardiomediastinal silhouette is unremarkable. A RIGHT Port-A-Cath is noted with tip overlying the LOWER SVC. There is no evidence of focal airspace disease, pulmonary edema, suspicious pulmonary nodule/mass, pleural effusion, or pneumothorax. No acute bony abnormalities are identified. IMPRESSION: No active disease. Electronically Signed   By: Margarette [place] M.D.   On: 06/09/2020 13:03   CT BONE MARROW BIOPSY & ASPIRATION  Result Date: 05/20/2020 INDICATION: 60 year old female with new diagnosis of multiple myeloma. Evaluate marrow involvement for staging. EXAM: CT GUIDED BONE MARROW ASPIRATION AND CORE BIOPSY Interventional Radiologist:  Criselda Peaches, MD MEDICATIONS: None. ANESTHESIA/SEDATION: Moderate (conscious) sedation was employed during this procedure. A total of 2 milligrams versed and 100 micrograms fentanyl were administered intravenously. The patient's level of consciousness and vital signs were monitored continuously by radiology nursing throughout the procedure under my direct supervision. Total monitored sedation time: 10 minutes FLUOROSCOPY TIME:  None. COMPLICATIONS: None immediate. Estimated blood loss: <25 mL PROCEDURE: Informed written consent was  obtained from the patient after a thorough discussion of the procedural risks, benefits and alternatives. All questions were addressed. Maximal Sterile Barrier Technique was utilized including caps, mask, sterile gowns, sterile gloves, sterile drape, hand hygiene and skin antiseptic. A timeout was performed prior to the initiation of the procedure. The patient was positioned prone and non-contrast localization CT was performed of the pelvis to demonstrate the iliac marrow spaces. Maximal barrier sterile technique utilized including caps, mask, sterile gowns, sterile gloves, large sterile drape, hand hygiene, and betadine prep. Under sterile conditions and local anesthesia, an 11 gauge coaxial bone  biopsy needle was advanced into the right iliac marrow space. Needle position was confirmed with CT imaging. Initially, bone marrow aspiration was performed. Next, the 11 gauge outer cannula was utilized to obtain a right iliac bone marrow core biopsy. Needle was removed. Hemostasis was obtained with compression. The patient tolerated the procedure well. Samples were prepared with the cytotechnologist. IMPRESSION: Technically successful CT-guided bone marrow aspiration and core biopsy of the right iliac bone. Electronically Signed   By: Jacqulynn Cadet M.D.   On: 05/20/2020 12:35   IR IMAGING GUIDED PORT INSERTION  Result Date: 06/05/2020 INDICATION: 60 year old female with history of multiple myeloma requiring central venous access for chemotherapy. EXAM: IMPLANTED PORT A CATH PLACEMENT WITH ULTRASOUND AND FLUOROSCOPIC GUIDANCE COMPARISON:  None. MEDICATIONS: None. ANESTHESIA/SEDATION: Moderate (conscious) sedation was employed during this procedure. A total of Versed 4 mg and Fentanyl 100 mcg was administered intravenously. Moderate Sedation Time: 15 minutes. The patient's level of consciousness and vital signs were monitored continuously by radiology nursing throughout the procedure under my direct supervision.  CONTRAST:  None FLUOROSCOPY TIME:  0 minutes, 6 seconds (1 mGy) COMPLICATIONS: None immediate. PROCEDURE: The procedure, risks, benefits, and alternatives were explained to the patient. Questions regarding the procedure were encouraged and answered. The patient understands and consents to the procedure. The right neck and chest were prepped with chlorhexidine in a sterile fashion, and a sterile drape was applied covering the operative field. Maximum barrier sterile technique with sterile gowns and gloves were used for the procedure. A timeout was performed prior to the initiation of the procedure. Ultrasound was used to examine the jugular vein which was compressible and free of internal echoes. A skin marker was used to demarcate the planned venotomy and port pocket incision sites. Local anesthesia was provided to these sites and the subcutaneous tunnel track with 1% lidocaine with 1:100,000 epinephrine. A small incision was created at the jugular access site and blunt dissection was performed of the subcutaneous tissues. Under real time ultrasound guidance, the jugular vein was accessed with a 21 ga micropuncture needle and an 0.018" wire was inserted to the superior vena cava. A 5 Fr micopuncture set was then used, through which a 0.035" Rosen wire was passed under fluoroscopic guidance into the inferior vena cava. An 8 Fr dilator was then placed over the wire. A subcutaneous port pocket was then created along the upper chest wall utilizing a combination of sharp and blunt dissection. The pocket was irrigated with sterile saline, packed with gauze, and observed for hemorrhage. A single lumen "ISP" sized power injectable port was chosen for placement. The 8 Fr catheter was tunneled from the port pocket site to the venotomy incision. The port was placed in the pocket. The external catheter was trimmed to appropriate length. The dilator was exchanged for an 8 Fr peel-away sheath under fluoroscopic guidance. The  catheter was then placed through the sheath and the sheath was removed. Final catheter positioning was confirmed and documented with a fluoroscopic spot radiograph. The port was accessed with a Huber needle, aspirated, and flushed with heparinized saline. The deep dermal layer of the port pocket incision was closed with interrupted 3-0 Vicryl suture. The skin was opposed with a running subcuticular 4-0 Monocryl suture. Dermabond was then placed over the port pocket and neck incisions. The patient tolerated the procedure well without immediate post procedural complication. FINDINGS: After catheter placement, the tip lies within the cavoatrial junction the catheter aspirates and flushes normally and is ready for immediate use.  IMPRESSION: Successful placement of a power injectable Port-A-Cath via the right internal jugular vein. The catheter is ready for immediate use. Ruthann Cancer, MD [facility] Electronically Signed   By: Ruthann Cancer MD   On: 06/05/2020 15:51    Labs: BNP (last 3 results) No results for input(s): BNP in the last 8760 hours. Basic Metabolic Panel: Recent Labs  Lab 06/10/20 0957 06/11/20 0537 06/12/20 0528 06/13/20 0728 06/14/20 0559  NA 139 134* 136 136 139  K 3.1* 4.4 4.1 3.9 3.8  CL 104 104 106 106 105  CO2 22 22 22 22 23   GLUCOSE 143* 103* 93 120* 89  BUN 12 10 11 15 20   CREATININE 0.87 0.73 0.73 0.58 0.69  CALCIUM 7.9* 7.9* 8.0* 7.9* 7.9*  MG 1.3* 2.0 1.8  --   --   PHOS  --  2.0* 2.8  --   --    Liver Function Tests: Recent Labs  Lab 06/09/20 1208 06/10/20 0957 06/11/20 0537 06/12/20 0528 06/13/20 0728  AST 16 16  --   --  23  ALT 15 16  --   --  43  ALKPHOS 39 34*  --   --  38  BILITOT 1.1 0.9  --   --  0.5  PROT 8.8* 7.3  --   --  7.2  ALBUMIN 3.0* 2.3* 2.3* 2.1* 2.1*   No results for input(s): LIPASE, AMYLASE in the last 168 hours. No results for input(s): AMMONIA in the last 168  hours. CBC: Recent Labs  Lab 06/09/20 1208 06/10/20 0957 06/11/20 0537 06/12/20 0528 06/13/20 0728 06/14/20 0559  WBC 14.1* 15.0* 18.1* 13.5* 9.4 5.0  NEUTROABS 11.7* 11.9* 14.8* 11.2* 7.7  --   HGB 9.9* 8.6* 8.2* 7.6* 9.5* 9.2*  HCT 31.1* 27.2* 26.2* 25.1* 30.2* 29.8*  MCV 85.4 85.5 87.3 88.4 85.3 85.9  PLT 142* 103* 85* 79* 93* 106*   Cardiac Enzymes: No results for input(s): CKTOTAL, CKMB, CKMBINDEX, TROPONINI in the last 168 hours. BNP: Invalid input(s): POCBNP CBG: No results for input(s): GLUCAP in the last 168 hours. D-Dimer No results for input(s): DDIMER in the last 72 hours. Hgb A1c No results for input(s): HGBA1C in the last 72 hours. Lipid Profile No results for input(s): CHOL, HDL, LDLCALC, TRIG, CHOLHDL, LDLDIRECT in the last 72 hours. Thyroid function studies No results for input(s): TSH, T4TOTAL, T3FREE, THYROIDAB in the last 72 hours.  Invalid input(s): FREET3 Anemia work up No results for input(s): VITAMINB12, FOLATE, FERRITIN, TIBC, IRON, RETICCTPCT in the last 72 hours. Urinalysis    Component Value Date/Time   COLORURINE YELLOW 06/09/2020 1208   APPEARANCEUR HAZY (A) 06/09/2020 1208   LABSPEC 1.010 06/09/2020 1208   PHURINE 6.0 06/09/2020 1208   GLUCOSEU NEGATIVE 06/09/2020 1208   HGBUR SMALL (A) 06/09/2020 1208   BILIRUBINUR NEGATIVE 06/09/2020 1208   KETONESUR NEGATIVE 06/09/2020 1208   PROTEINUR NEGATIVE 06/09/2020 1208   NITRITE NEGATIVE 06/09/2020 [place] 06/09/2020 1208   Sepsis Labs Invalid input(s): PROCALCITONIN,  WBC,  LACTICIDVEN Microbiology Recent Results (from the past 240 hour(s))  Resp Panel by RT-PCR (Flu A&B, Covid) Nasopharyngeal Swab     Status: None   Collection Time: 06/09/20 12:08 PM   Specimen: Nasopharyngeal Swab; Nasopharyngeal(NP) swabs in vial transport medium  Result Value Ref Range Status   SARS Coronavirus 2 by RT PCR NEGATIVE NEGATIVE Final    Comment: (NOTE) SARS-CoV-2 target nucleic  acids are NOT DETECTED.  The SARS-CoV-2 RNA is generally detectable in upper respiratory specimens during the acute phase of infection. The lowest concentration of SARS-CoV-2 viral copies this assay can detect is 138 copies/mL. A negative result does not preclude SARS-Cov-2 infection and should not be used as the sole basis for treatment or other patient management decisions. A negative result may occur with  improper specimen collection/handling, submission of specimen other than nasopharyngeal swab, presence of viral mutation(s) within the areas targeted by this assay, and inadequate number of viral copies(<138 copies/mL). A negative result must be combined with clinical observations, patient history, and epidemiological information. The expected result is Negative.  Fact Sheet for Patients:  EntrepreneurPulse.com.au  Fact Sheet for Healthcare Providers:  IncredibleEmployment.be  This test is no t yet approved or cleared by the [place] FDA and  has been authorized for detection and/or diagnosis of SARS-CoV-2 by FDA under an Emergency Use Authorization (EUA). This EUA will remain  in effect (meaning this test can be used) for the duration of the COVID-19 declaration under Section 564(b)(1) of the Act, 21 U.S.C.section 360bbb-3(b)(1), unless the authorization is terminated  or revoked sooner.       Influenza A by PCR NEGATIVE NEGATIVE Final   Influenza B by PCR NEGATIVE NEGATIVE Final    Comment: (NOTE) The Xpert Xpress SARS-CoV-2/FLU/RSV plus assay is intended as an aid in the diagnosis of influenza from Nasopharyngeal swab specimens and should not be used as a sole basis for treatment. Nasal washings and aspirates are unacceptable for Xpert Xpress SARS-CoV-2/FLU/RSV testing.  Fact Sheet for Patients: EntrepreneurPulse.com.au  Fact Sheet for Healthcare Providers: IncredibleEmployment.be  This  test is not yet approved or cleared by the [place] FDA and has been authorized for detection and/or diagnosis of SARS-CoV-2 by FDA under an Emergency Use Authorization (EUA). This EUA will remain in effect (meaning this test can be used) for the duration of the COVID-19 declaration under Section 564(b)(1) of the Act, 21 U.S.C. section 360bbb-3(b)(1), unless the authorization is terminated or revoked.  Performed at [facility], [place] [address], [place], [place] [address]   Blood Culture (routine x 2)     Status: None (Preliminary result)   Collection Time: 06/09/20 12:08 PM   Specimen: BLOOD  Result Value Ref Range Status   Specimen Description   Final    BLOOD BLOOD LEFT HAND Performed at [place] [address], [place], [place] [address]    Special Requests   Final    BOTTLES DRAWN AEROBIC AND ANAEROBIC Blood Culture adequate volume Performed at [facility] [address], [place], [place] [address]    Culture   Final    NO GROWTH 4 DAYS Performed at [facility], [place] [address], [place], [place] [address]    Report Status PENDING  Incomplete  Urine culture     Status: None   Collection Time: 06/09/20 12:08 PM   Specimen: In/Out Cath Urine  Result Value Ref Range Status   Specimen Description   Final    IN/OUT CATH URINE Performed at [facility] [address], [place], [place] [address]    Special Requests   Final    NONE Performed at [facility], [place] [address] [place], [place] [address]    Culture   Final    NO GROWTH Performed at [facility], [place] [address] [place], [place] [address]    Report Status 06/10/2020 FINAL  Final  Blood Culture (routine x 2)     Status: None (Preliminary result)   Collection Time: 06/09/20 12:13 PM   Specimen: BLOOD  Result Value Ref Range Status   Specimen Description   Final    BLOOD LEFT  ANTECUBITAL Performed at [facility] [address] [place], [place] [address]    Special Requests   Final    BOTTLES DRAWN AEROBIC AND ANAEROBIC Blood Culture adequate volume Performed at [facility] [address] [place], [place] [address]    Culture   Final    NO GROWTH 4 DAYS Performed at [facility], [place] [address], [place], [place] [address]    Report Status PENDING  Incomplete  Gastrointestinal Panel by PCR , Stool     Status: None   Collection Time: 06/09/20  7:00 PM   Specimen: Stool  Result Value Ref Range Status   Campylobacter species NOT DETECTED NOT DETECTED Final   Plesimonas shigelloides NOT DETECTED NOT DETECTED Final   Salmonella species NOT DETECTED NOT DETECTED Final   Yersinia enterocolitica NOT DETECTED NOT DETECTED Final   Vibrio species NOT DETECTED NOT DETECTED Final   Vibrio cholerae NOT DETECTED NOT DETECTED Final   Enteroaggregative E coli (EAEC) NOT DETECTED NOT DETECTED Final   Enteropathogenic E coli (EPEC) NOT DETECTED NOT DETECTED Final   Enterotoxigenic E coli (ETEC) NOT DETECTED NOT DETECTED Final   Shiga like toxin producing E coli (STEC) NOT DETECTED NOT DETECTED Final   Shigella/Enteroinvasive E coli (EIEC) NOT DETECTED NOT DETECTED Final   Cryptosporidium NOT DETECTED NOT DETECTED Final   Cyclospora cayetanensis NOT DETECTED NOT DETECTED Final   Entamoeba histolytica NOT DETECTED NOT DETECTED Final   Giardia lamblia NOT DETECTED NOT DETECTED Final   Adenovirus F40/41 NOT DETECTED NOT DETECTED Final   Astrovirus NOT DETECTED NOT DETECTED Final   Norovirus GI/GII NOT DETECTED NOT DETECTED Final   Rotavirus A NOT DETECTED NOT DETECTED Final   Sapovirus (I, II, IV, and V) NOT DETECTED NOT DETECTED Final    Comment: Performed at [facility], [address] [place], [place] [address]  C Difficile Quick Screen w PCR reflex     Status: Abnormal   Collection Time: 06/09/20  7:00 PM    Specimen: Stool  Result Value Ref Range Status   C Diff antigen POSITIVE (A) NEGATIVE Final   C Diff toxin NEGATIVE NEGATIVE Final   C Diff interpretation Results are indeterminate. See PCR results.  Final    Comment: Performed at [facility], [place] [address] [place], [place] [address]  MRSA PCR Screening     Status: None   Collection Time: 06/09/20  8:00 PM   Specimen: Nasopharyngeal  Result Value Ref Range Status   MRSA by PCR NEGATIVE NEGATIVE Final    Comment:        The GeneXpert MRSA Assay (FDA approved for NASAL specimens only), is one component of a comprehensive MRSA colonization surveillance program. It is not intended to diagnose MRSA infection nor to guide or monitor treatment for MRSA infections. Performed at [facility], [place] [address], [place],  [address]      Time coordinating discharge: 25 minutes  SIGNED: Antonieta Pert, MD  [facility] 06/14/2020, 9:18 AM  If 7PM-7AM, please contact night-coverage [email or web address]

## 2020-06-14 NOTE — Progress Notes (Signed)
Nutrition Note  RD consulted for nutritional assessment.  RN reached out to inform RD that pt is discharging today and waiting to speak with RD about diet.  Pt and husband in room. Pt reports feeling concerned about her blood levels and concerned about her blood sugar when she is taking steroids. Reviewed healthy diet with patient and to focus on high fiber and complex carbohydrates. Discouraged high amounts of simple sugars and red meat. Encouraged adequate amounts of protein to maintain weight status. Weight has been stable at this time. Family history of diabetes noted.  Pt appreciative of visit.   Wt Readings from Last 15 Encounters:  06/09/20 63.4 kg  06/06/20 63.4 kg  05/28/20 62.3 kg  04/16/20 63.7 kg  10/08/17 64 kg  08/27/17 64.4 kg    Body mass index is 23.26 kg/m. Patient meets criteria for normal based on current BMI.   Current diet order is heart healthy, patient is consuming approximately 100% of meals at this time. Labs and medications reviewed.    If nutrition issues arise, please consult RD.   Clayton Bibles, MS, RD, LDN Inpatient Clinical Dietitian Contact information available via Amion

## 2020-06-18 LAB — MULTIPLE MYELOMA PANEL, SERUM
Albumin SerPl Elph-Mcnc: 2.3 g/dL — ABNORMAL LOW (ref 2.9–4.4)
Albumin/Glob SerPl: 0.6 — ABNORMAL LOW (ref 0.7–1.7)
Alpha 1: 0.4 g/dL (ref 0.0–0.4)
Alpha2 Glob SerPl Elph-Mcnc: 0.8 g/dL (ref 0.4–1.0)
B-Globulin SerPl Elph-Mcnc: 2.9 g/dL — ABNORMAL HIGH (ref 0.7–1.3)
Gamma Glob SerPl Elph-Mcnc: 0.4 g/dL (ref 0.4–1.8)
Globulin, Total: 4.5 g/dL — ABNORMAL HIGH (ref 2.2–3.9)
IgA: 3723 mg/dL — ABNORMAL HIGH (ref 87–352)
IgG (Immunoglobin G), Serum: 128 mg/dL — ABNORMAL LOW (ref 586–1602)
IgM (Immunoglobulin M), Srm: 10 mg/dL — ABNORMAL LOW (ref 26–217)
M Protein SerPl Elph-Mcnc: 2.1 g/dL — ABNORMAL HIGH
Total Protein ELP: 6.8 g/dL (ref 6.0–8.5)

## 2020-06-19 NOTE — Progress Notes (Signed)
HEMATOLOGY/ONCOLOGY CONSULTATION NOTE  Date of Service: 06/20/2020  Patient Care Team: [name], MD as PCP - General (Family Medicine) [name], [name], MD as PCP - Cardiology (Cardiology) [name] [name], MD as Consulting Physician (Gastroenterology)  CHIEF COMPLAINTS/PURPOSE OF CONSULTATION:  Multiple Myeloma  HISTORY OF PRESENTING ILLNESS:   Megan Velasquez is a wonderful 60 y.o. female who has been referred to [place] for evaluation and management of multiple myeloma. Pt is accompanied today by her husband for this visit.   Patient was referred to [place] for second opinion regarding management of newly diagnosed multiple myeloma by Dr. Laurena Slimmer MD.  Patient had been referred to to [facility] center by her primary care physician for anemia and elevated serum protein levels.  She also had some increasing fatigue and tingling in her hands and feet.  Noted primarily in the right wrist and right ankle.  B12 levels were noted to be borderline low.  She had a work-up including labs which showed hemoglobin of 9.7 with an MCV of 86.9 WBC count of 6.95k and platelets of 165k. CMP showed a creatinine of 1.06 calcium level of 8.9 total protein of 10.2 with an albumin of 2.7 normal liver function tests and other electrolytes. Serum protein electrophoresis with an M spike of 4.3 with an additional M spike of 0.2 g/dL.  IgA quantitative more than 6400. B12 levels 276, homocystine 15.9 Serum folate 26.5 LDH 96 Beta-2 microglobulin of 2.5  Serum kappa lambda free light chains showed free kappa light chains of 181, free lambda light chains of 3.6 and a free kappa lambda ratio of 50.3.  24-hour UPEP showed no M spike and a total protein of 5.7 mg/dL.  Bone Survey completed on 03/08/2020 with results revealing "No evidence of lytic nor blastic lesions within the appendicular or axial skeleton. Degenerative changes as described above."   On review of systems, pt reports some mild fatigue.  No  focal bone pains.  No chest pain.  No shortness of breath. No evidence of GI bleeding no hematuria no nosebleeds or gum bleeds. No other acute new focal symptoms. Denies any history of known cardiopulmonary disease strokes or heart attacks.  INTERVAL HISTORY Megan Velasquez is a wonderful 60 y.o. female who is here today for evaluation and management of multiple myeloma. The patient's last visit with [place] was on 06/06/2020. The pt reports that she is doing well overall. We are joined today by her husband.  The pt reports that she has bene gradually improving since getting out of the hospital with fevers. The pt notes her anxiety is better when she is at home. The pt notes that this has not been affecting her quality of life. The pt notes that sometimes she feels as though she cannot breathe well and is slightly SOB. This has improved since her anemia has improved. She does better in the mornings, but gets more SOB and fatigued in the evenings.  Lab results today 06/20/2020 of CBC w/diff and CMP is as follows: all values are WNL except for Hgb of 10.5, HCT of 32.7, RDW of 19.0. CMP pending. 06/20/2020 MMP in progress. 06/20/2020 Light Chains in progress.  On review of systems, pt reports SOB, weight loss and denies fevers, cough, change in bowel habits, change in urination, chills, leg swelling, and any other symptoms.  MEDICAL HISTORY:  Past Medical History:  Diagnosis Date  . Cancer ([place])   . GERD (gastroesophageal reflux disease)   . History of kidney  stones   . HTN (hypertension)   . Renal disorder     SURGICAL HISTORY: Past Surgical History:  Procedure Laterality Date  . CHOLECYSTECTOMY    . COLONOSCOPY  01/2015   Dr. Britta Mccreedy: Mild diverticulosis, sessile polyp ranging 3 to 5 mm removed from the proximal transverse colon, semi-pedunculated polyp 5 to 9 mm in size removed from the sigmoid colon.  Sigmoid colon polyp was serrated adenoma, transverse colon polyp was adenomatous.  Patient  was told to have another colonoscopy in 3 years.  . COLONOSCOPY WITH PROPOFOL N/A 10/11/2017   Procedure: COLONOSCOPY WITH PROPOFOL;  Surgeon: Daneil Dolin, MD;  Location: [facility];  Service: Endoscopy;  Laterality: N/A;  1:15pm  . IR IMAGING GUIDED PORT INSERTION  06/05/2020  . POLYPECTOMY  10/11/2017   Procedure: POLYPECTOMY;  Surgeon: Daneil Dolin, MD;  Location: [facility];  Service: Endoscopy;;  descending colon polyps cs times 2    SOCIAL HISTORY: Social History   Socioeconomic History  . Marital status: Married    Spouse name: Not on file  . Number of children: Not on file  . Years of education: Not on file  . Highest education level: Not on file  Occupational History  . Not on file  Tobacco Use  . Smoking status: Former Smoker    Packs/day: 1.00    Years: 28.00    Pack years: 28.00    Types: Cigarettes    Quit date: 10/08/2013    Years since quitting: 6.7  . Smokeless tobacco: Never Used  Vaping Use  . Vaping Use: Never used  Substance and Sexual Activity  . Alcohol use: Not Currently  . Drug use: Never  . Sexual activity: Yes    Birth control/protection: Post-menopausal  Other Topics Concern  . Not on file  Social History Narrative  . Not on file   Social Determinants of Health   Financial Resource Strain: Low Risk   . Difficulty of Paying Living Expenses: Not hard at all  Food Insecurity: No Food Insecurity  . Worried About Charity fundraiser in the Last Year: Never true  . Ran Out of Food in the Last Year: Never true  Transportation Needs: No Transportation Needs  . Lack of Transportation (Medical): No  . Lack of Transportation (Non-Medical): No  Physical Activity: Not on file  Stress: No Stress Concern Present  . Feeling of Stress : Only a little  Social Connections: Unknown  . Frequency of Communication with Friends and Family: More than three times a week  . Frequency of Social Gatherings with Friends and Family: More than three times a week   . Attends Religious Services: Not on file  . Active Member of Clubs or Organizations: Not on file  . Attends Archivist Meetings: Not on file  . Marital Status: Married  Human resources officer Violence: Not on file    FAMILY HISTORY: Family History  Problem Relation Age of Onset  . Heart disease Mother   . Diabetes Mother   . Lung cancer Father   . COPD Father   . Colon cancer Neg Hx     ALLERGIES:  has No Known Allergies.  MEDICATIONS:  Current Outpatient Medications  Medication Sig Dispense Refill  . acetaminophen (TYLENOL) 500 MG tablet Take 1,000 mg by mouth every 6 (six) hours as needed for fever or headache.    Marland Kitchen acyclovir (ZOVIRAX) 400 MG tablet Take 1 tablet (400 mg total) by mouth 2 (two) times daily.  30 tablet 3  . apixaban (ELIQUIS) 2.5 MG TABS tablet Take 1 tablet (2.5 mg total) by mouth 2 (two) times daily. 60 tablet 5  . Ascorbic Acid (VITAMIN C) 1000 MG tablet Take 1,000 mg by mouth every morning.    Marland Kitchen b complex vitamins capsule Take 1 capsule by mouth daily. (Patient taking differently: Take 1 capsule by mouth every morning.) 30 capsule 3  . Cholecalciferol (VITAMIN D3) 5000 units CAPS Take 5,000 Units by mouth every morning.    . Cyanocobalamin (VITAMIN B12) 3000 MCG SUBL Place 3,000 mcg under the tongue every morning.    Marland Kitchen dexamethasone (DECADRON) 4 MG tablet Take 10 tablets ($RemoveBef'40mg'SoCRqSQpvb$ ) once on day 22. Repeat every 28 days for the first 4 cycles. Take with breakfast. (Patient taking differently: Take 40 mg by mouth See admin instructions. Take 10 tablets ($RemoveBef'40mg'jXYrHTJigt$ ) once on day 22 after start of chemo.  Repeat every 28 days for the first 4 cycles. Take with breakfast.) 40 tablet 4  . lenalidomide (REVLIMID) 15 MG capsule Take 1 capsule (15 mg total) by mouth daily. Take for 21 days. Hold 7 days. Repeat every 28 days. [employer] [name] #[id number]; Date obtained 05/29/20 (Patient taking differently: Take 15 mg by mouth See admin instructions. Take one capsule (15 mg) by mouth daily  at bedtime for 21 days. Hold 7 days. Repeat every 28 days. [employer] [name] #[id number]; Date obtained 05/29/20) 21 capsule 0  . lidocaine-prilocaine (EMLA) cream Apply to affected area once (Patient taking differently: Apply 1 application topically once as needed (prior to port access).) 30 g 3  . loratadine (CLARITIN) 10 MG tablet Take 10 mg by mouth every morning.    Marland Kitchen LORazepam (ATIVAN) 0.5 MG tablet Take 1 tablet (0.5 mg total) by mouth every 6 (six) hours as needed (Nausea or vomiting). 30 tablet 0  . omeprazole (PRILOSEC) 40 MG capsule Take 40 mg by mouth daily.    . ondansetron (ZOFRAN) 8 MG tablet Take 1 tablet (8 mg total) by mouth 2 (two) times daily as needed (Nausea or vomiting). 30 tablet 1  . prochlorperazine (COMPAZINE) 10 MG tablet Take 1 tablet (10 mg total) by mouth every 6 (six) hours as needed (Nausea or vomiting). 30 tablet 1   No current facility-administered medications for this visit.    REVIEW OF SYSTEMS:   10 Point review of Systems was done is negative except as noted above.  PHYSICAL EXAMINATION: ECOG PERFORMANCE STATUS: 1 - Symptomatic but completely ambulatory  . Vitals:   06/20/20 0944  BP: (!) 149/76  Pulse: 80  Resp: 13  Temp: 97.9 F (36.6 C)  SpO2: 100%   Filed Weights   06/20/20 0944  Weight: 134 lb 1.6 oz (60.8 kg)   .Body mass index is 22.32 kg/m.   GENERAL:alert, in no acute distress and comfortable SKIN: no acute rashes, no significant lesions EYES: conjunctiva are pink and non-injected, sclera anicteric OROPHARYNX: MMM, no exudates, no oropharyngeal erythema or ulceration NECK: supple, no JVD LYMPH:  no palpable lymphadenopathy in the cervical, axillary or inguinal regions LUNGS: clear to auscultation b/l with normal respiratory effort HEART: regular rate & rhythm ABDOMEN:  normoactive bowel sounds , non tender, not distended. Extremity: no pedal edema PSYCH: alert & oriented x 3 with fluent speech NEURO: no focal motor/sensory  deficits  LABORATORY DATA:  I have reviewed the data as listed  . CBC Latest Ref Rng & Units 06/20/2020 06/14/2020 06/13/2020  WBC 4.0 - 10.5 K/uL 5.0 5.0 9.4  Hemoglobin  12.0 - 15.0 g/dL 10.5(L) 9.2(L) 9.5(L)  Hematocrit 36.0 - 46.0 % 32.7(L) 29.8(L) 30.2(L)  Platelets 150 - 400 K/uL 297 106(L) 93(L)    . CMP Latest Ref Rng & Units 06/20/2020 06/14/2020 06/13/2020  Glucose 70 - 99 mg/dL 115(H) 89 120(H)  BUN 6 - 20 mg/dL 23(H) 20 15  Creatinine 0.44 - 1.00 mg/dL 0.88 0.69 0.58  Sodium 135 - 145 mmol/L 136 139 136  Potassium 3.5 - 5.1 mmol/L 4.2 3.8 3.9  Chloride 98 - 111 mmol/L 101 105 106  CO2 22 - 32 mmol/L _0 Calcium 8.9 - 10.3 mg/dL 8.6(L) 7.9(L) 7.9(L)  Total Protein 6.5 - 8.1 g/dL 9.1(H) - 7.2  Total Bilirubin 0.3 - 1.2 mg/dL 0.4 - 0.5  Alkaline Phos 38 - 126 U/L 56 - 38  AST 15 - 41 U/L 15 - 23  ALT 0 - 44 U/L 27 - 43     Most recent lab results (03/15/2020) of CBC is as follows: all values are WNL except for RBC at 3.66, Hgb at 9.7, HCT at 31.8, MCHC at 30.5, RDW Standard Deviation 58.7, Red Cell Distribution Width At 18.6,  BUN at 19, Creatinine at 1.06, Total Protein at 10.2, Albumin at 2.7, AST at 14,. 03/13/2020 UPEP shows no M-Spike, all values are WNL 03/06/2020 SPEP shows Total Protein at 10.1, Beta Globulin at 5.2, M-Spike at 4.3, Total Globulin at 6.4, A/G Ratio at 0.6 03/06/2020 Free Kappa Light Chains at 181.1, Free Lambda Light Chains Serum at 3.6, K/L ratio at 50.31 03/06/2020 Beta-2 Microglobulin at 2.5           RADIOGRAPHIC STUDIES: I have personally reviewed the radiological images as listed and agreed with the findings in the report. CT ANGIO CHEST PE W OR WO CONTRAST  Result Date: 06/12/2020 CLINICAL DATA:  Shortness of breath for 3 days EXAM: CT ANGIOGRAPHY CHEST WITH CONTRAST TECHNIQUE: Multidetector CT imaging of the chest was performed using the standard protocol during bolus administration of intravenous contrast. Multiplanar CT image  reconstructions and MIPs were obtained to evaluate the vascular anatomy. CONTRAST:  31m OMNIPAQUE IOHEXOL 350 MG/ML SOLN COMPARISON:  Radiograph June 09, 2020 FINDINGS: Cardiovascular: There is a optimal opacification of the pulmonary arteries. There is no central,segmental, or subsegmental filling defects within the pulmonary arteries. The heart is normal in size. No pericardial effusion or thickening. No evidence right heart strain. There is normal three-vessel brachiocephalic anatomy without proximal stenosis. Scattered aortic atherosclerosis is noted. Mediastinum/Nodes: No hilar, mediastinal, or axillary adenopathy. Thyroid gland, trachea, and esophagus demonstrate no significant findings. Right-sided MediPort catheter seen with the tip at the superior cavoatrial junction. Lungs/Pleura: The lungs are clear. No pleural effusion or pneumothorax. No airspace consolidation. Upper Abdomen: No acute abnormalities present in the visualized portions of the upper abdomen. Diffuse low density seen throughout the right Musculoskeletal: No chest wall abnormality. No acute or significant osseous findings. Review of the MIP images confirms the above findings. IMPRESSION: No central, segmental, or subsegmental pulmonary embolism No acute intrathoracic pathology to explain the patient's symptoms Aortic Atherosclerosis (ICD10-I70.0). Electronically Signed   By: BPrudencio PairM.D.   On: 06/12/2020 12:10   DG Chest Port 1 View  Result Date: 06/09/2020 CLINICAL DATA:  Fever and shortness of breath. EXAM: PORTABLE CHEST 1 VIEW COMPARISON:  None. FINDINGS: The cardiomediastinal silhouette is unremarkable. A RIGHT Port-A-Cath is noted with tip overlying the LOWER SVC. There is no evidence of focal airspace disease, pulmonary edema, suspicious pulmonary nodule/mass,  pleural effusion, or pneumothorax. No acute bony abnormalities are identified. IMPRESSION: No active disease. Electronically Signed   By: Margarette Canada M.D.   On:  06/09/2020 13:03   IR IMAGING GUIDED PORT INSERTION  Result Date: 06/05/2020 INDICATION: 60 year old female with history of multiple myeloma requiring central venous access for chemotherapy. EXAM: IMPLANTED PORT A CATH PLACEMENT WITH ULTRASOUND AND FLUOROSCOPIC GUIDANCE COMPARISON:  None. MEDICATIONS: None. ANESTHESIA/SEDATION: Moderate (conscious) sedation was employed during this procedure. A total of Versed 4 mg and Fentanyl 100 mcg was administered intravenously. Moderate Sedation Time: 15 minutes. The patient's level of consciousness and vital signs were monitored continuously by radiology nursing throughout the procedure under my direct supervision. CONTRAST:  None FLUOROSCOPY TIME:  0 minutes, 6 seconds (1 mGy) COMPLICATIONS: None immediate. PROCEDURE: The procedure, risks, benefits, and alternatives were explained to the patient. Questions regarding the procedure were encouraged and answered. The patient understands and consents to the procedure. The right neck and chest were prepped with chlorhexidine in a sterile fashion, and a sterile drape was applied covering the operative field. Maximum barrier sterile technique with sterile gowns and gloves were used for the procedure. A timeout was performed prior to the initiation of the procedure. Ultrasound was used to examine the jugular vein which was compressible and free of internal echoes. A skin marker was used to demarcate the planned venotomy and port pocket incision sites. Local anesthesia was provided to these sites and the subcutaneous tunnel track with 1% lidocaine with 1:100,000 epinephrine. A small incision was created at the jugular access site and blunt dissection was performed of the subcutaneous tissues. Under real time ultrasound guidance, the jugular vein was accessed with a 21 ga micropuncture needle and an 0.018" wire was inserted to the superior vena cava. A 5 Fr micopuncture set was then used, through which a 0.035" Rosen wire was passed  under fluoroscopic guidance into the inferior vena cava. An 8 Fr dilator was then placed over the wire. A subcutaneous port pocket was then created along the upper chest wall utilizing a combination of sharp and blunt dissection. The pocket was irrigated with sterile saline, packed with gauze, and observed for hemorrhage. A single lumen "ISP" sized power injectable port was chosen for placement. The 8 Fr catheter was tunneled from the port pocket site to the venotomy incision. The port was placed in the pocket. The external catheter was trimmed to appropriate length. The dilator was exchanged for an 8 Fr peel-away sheath under fluoroscopic guidance. The catheter was then placed through the sheath and the sheath was removed. Final catheter positioning was confirmed and documented with a fluoroscopic spot radiograph. The port was accessed with a Huber needle, aspirated, and flushed with heparinized saline. The deep dermal layer of the port pocket incision was closed with interrupted 3-0 Vicryl suture. The skin was opposed with a running subcuticular 4-0 Monocryl suture. Dermabond was then placed over the port pocket and neck incisions. The patient tolerated the procedure well without immediate post procedural complication. FINDINGS: After catheter placement, the tip lies within the cavoatrial junction the catheter aspirates and flushes normally and is ready for immediate use. IMPRESSION: Successful placement of a power injectable Port-A-Cath via the right internal jugular vein. The catheter is ready for immediate use. Ruthann Cancer, MD [facility] Radiology Electronically Signed   By: Ruthann Cancer MD   On: 06/05/2020 15:51    ASSESSMENT & PLAN:   60 year old female with  #1 Newly diagnosed multiple  myeloma -IgA kappa.  Presenting with anemia hemoglobin today 9.4. Creatinine 1.28 No hypercalcemia Bone survey with no obvious skeletal lesions. Baseline M spike  of 4.3 g/dL. Beta-2 Microglobulin at 2.5 Mol Cy Monosomy 13 and dup 1q   PLAN: -Discussed pt labwork today, 06/20/2020; blood counts improved.  -Advised pt that her last MMP showed her m protein decreased by 60% with one week of treatment. -Will be adding steroids and tylenol prior to treatment to reduce drug-related fevers. -Advised pt to take 650 mg Tylenol once home from treatment days. -Discussed causes for her fevers: antibiotics for treatment or tumor lysis.  -Will restart Carfilzomib this cycle and will wait for Revlimid until C2D1 pending toleration of these treatments prior. Will continue to monitor and update pt.  -Discussed nutrition and diet for pt. Advised her she has no restrictions-- carbs and sugars food are allowed at this time. Do not completely give up salt.  -Emphasized keeping balance to not shock immune system and make pt weaker.  -Discussed leave from work and pt's need for a Short Term Disability papers. Recommended pt f/u w Education officer, museum for help regarding this matter. The pt notes this letter has been sent and they are awaiting notice. -Advised pt the goal of VGPR and deepest response within 4-6 cycles of treatment. -Recommended pt stay physically active, drink 48-64 oz water daily, and eat healthy. Make sure to get rest. -Will keep Carfilzomib dose at 20 mg/m^2 at this time for today and tomorrow. Dose escalation will be considered with C2. -Continue 2.5 mg Eliquis BID. -Will see back in 2 weeks with C2D1.  FOLLOW UP: Please schedule cycle 2 of carfilzomib as per orders. Port flush and labs with day 1 day 8 and day 15 of treatment MD visit with cycle 2-day 1 for toxicity check    All of the patients questions were answered with apparent satisfaction. The patient knows to call the clinic with any problems, questions or concerns.     The total time spent in the appointment was 30 minutes and more than 50% was on counseling and direct patient cares, ordering  and mx of chemotherapy     Sullivan Lone MD MS AAHIVMS [facility] Hematology/Oncology Physician [facility]  (Office):       [phone] (Work cell):  [phone] (Fax):           [phone]  06/20/2020 10:18 AM  I, Reinaldo Raddle, am acting as scribe for Dr. Sullivan Lone, MD.

## 2020-06-20 ENCOUNTER — Inpatient Hospital Stay: Payer: BC Managed Care – PPO

## 2020-06-20 ENCOUNTER — Other Ambulatory Visit: Payer: Self-pay

## 2020-06-20 ENCOUNTER — Inpatient Hospital Stay (HOSPITAL_BASED_OUTPATIENT_CLINIC_OR_DEPARTMENT_OTHER): Payer: BC Managed Care – PPO | Admitting: Hematology

## 2020-06-20 ENCOUNTER — Other Ambulatory Visit: Payer: BC Managed Care – PPO

## 2020-06-20 ENCOUNTER — Other Ambulatory Visit: Payer: Self-pay | Admitting: *Deleted

## 2020-06-20 ENCOUNTER — Ambulatory Visit: Payer: BC Managed Care – PPO

## 2020-06-20 VITALS — BP 149/76 | HR 80 | Temp 97.9°F | Resp 13 | Ht 65.0 in | Wt 134.1 lb

## 2020-06-20 DIAGNOSIS — Z5112 Encounter for antineoplastic immunotherapy: Secondary | ICD-10-CM | POA: Diagnosis present

## 2020-06-20 DIAGNOSIS — C9000 Multiple myeloma not having achieved remission: Secondary | ICD-10-CM

## 2020-06-20 DIAGNOSIS — Z7189 Other specified counseling: Secondary | ICD-10-CM

## 2020-06-20 DIAGNOSIS — Z79899 Other long term (current) drug therapy: Secondary | ICD-10-CM | POA: Diagnosis not present

## 2020-06-20 DIAGNOSIS — Z95828 Presence of other vascular implants and grafts: Secondary | ICD-10-CM

## 2020-06-20 LAB — CMP (CANCER CENTER ONLY)
ALT: 27 U/L (ref 0–44)
AST: 15 U/L (ref 15–41)
Albumin: 2.9 g/dL — ABNORMAL LOW (ref 3.5–5.0)
Alkaline Phosphatase: 56 U/L (ref 38–126)
Anion gap: 11 (ref 5–15)
BUN: 23 mg/dL — ABNORMAL HIGH (ref 6–20)
CO2: 24 mmol/L (ref 22–32)
Calcium: 8.6 mg/dL — ABNORMAL LOW (ref 8.9–10.3)
Chloride: 101 mmol/L (ref 98–111)
Creatinine: 0.88 mg/dL (ref 0.44–1.00)
GFR, Estimated: >60 mL/min (ref >60)
Glucose, Bld: 115 mg/dL — ABNORMAL HIGH (ref 70–99)
Potassium: 4.2 mmol/L (ref 3.5–5.1)
Sodium: 136 mmol/L (ref 135–145)
Total Bilirubin: 0.4 mg/dL (ref 0.3–1.2)
Total Protein: 9.1 g/dL — ABNORMAL HIGH (ref 6.5–8.1)

## 2020-06-20 LAB — CBC WITH DIFFERENTIAL (CANCER CENTER ONLY)
Abs Immature Granulocytes: 0.02 10*3/uL (ref 0.00–0.07)
Basophils Absolute: 0 10*3/uL (ref 0.0–0.1)
Basophils Relative: 0 %
Eosinophils Absolute: 0.1 10*3/uL (ref 0.0–0.5)
Eosinophils Relative: 1 %
HCT: 32.7 % — ABNORMAL LOW (ref 36.0–46.0)
Hemoglobin: 10.5 g/dL — ABNORMAL LOW (ref 12.0–15.0)
Immature Granulocytes: 0 %
Lymphocytes Relative: 51 %
Lymphs Abs: 2.5 10*3/uL (ref 0.7–4.0)
MCH: 26.8 pg (ref 26.0–34.0)
MCHC: 32.1 g/dL (ref 30.0–36.0)
MCV: 83.4 fL (ref 80.0–100.0)
Monocytes Absolute: 0.4 10*3/uL (ref 0.1–1.0)
Monocytes Relative: 8 %
Neutro Abs: 2 10*3/uL (ref 1.7–7.7)
Neutrophils Relative %: 40 %
Platelet Count: 297 10*3/uL (ref 150–400)
RBC: 3.92 MIL/uL (ref 3.87–5.11)
RDW: 19 % — ABNORMAL HIGH (ref 11.5–15.5)
WBC Count: 5 10*3/uL (ref 4.0–10.5)
nRBC: 0 % (ref 0.0–0.2)

## 2020-06-20 LAB — SAMPLE TO BLOOD BANK

## 2020-06-20 MED ORDER — HEPARIN SOD (PORK) LOCK FLUSH 100 UNIT/ML IV SOLN
500.0000 [IU] | Freq: Once | INTRAVENOUS | Status: AC | PRN
  Administered 2020-06-20: 500 [IU]
  Filled 2020-06-20: qty 5

## 2020-06-20 MED ORDER — SODIUM CHLORIDE 0.9 % IV SOLN
Freq: Once | INTRAVENOUS | Status: AC
  Filled 2020-06-20: qty 250

## 2020-06-20 MED ORDER — FAMOTIDINE 20 MG PO TABS
ORAL_TABLET | ORAL | Status: AC
  Filled 2020-06-20: qty 1

## 2020-06-20 MED ORDER — CARFILZOMIB CHEMO INJECTION 60 MG
20.0000 mg/m2 | Freq: Once | INTRAVENOUS | Status: AC
  Administered 2020-06-20: 34 mg via INTRAVENOUS
  Filled 2020-06-20: qty 15

## 2020-06-20 MED ORDER — FAMOTIDINE 20 MG PO TABS: 20.0000 mg | ORAL_TABLET | Freq: Once | ORAL | Status: DC

## 2020-06-20 MED ORDER — DIPHENHYDRAMINE HCL 25 MG PO CAPS
ORAL_CAPSULE | ORAL | Status: AC
  Filled 2020-06-20: qty 1

## 2020-06-20 MED ORDER — SODIUM CHLORIDE 0.9% FLUSH
10.0000 mL | INTRAVENOUS | Status: DC | PRN
  Administered 2020-06-20: 10 mL
  Filled 2020-06-20: qty 10

## 2020-06-20 MED ORDER — SODIUM CHLORIDE 0.9 % IV SOLN
Freq: Once | INTRAVENOUS | Status: DC
  Filled 2020-06-20: qty 250

## 2020-06-20 MED ORDER — SODIUM CHLORIDE 0.9% FLUSH
10.0000 mL | Freq: Once | INTRAVENOUS | Status: AC
  Administered 2020-06-20: 10 mL
  Filled 2020-06-20: qty 10

## 2020-06-20 MED ORDER — FAMOTIDINE IN NACL 20-0.9 MG/50ML-% IV SOLN
INTRAVENOUS | Status: AC
  Filled 2020-06-20: qty 50

## 2020-06-20 MED ORDER — DIPHENHYDRAMINE HCL 25 MG PO CAPS
25.0000 mg | ORAL_CAPSULE | Freq: Once | ORAL | Status: AC
  Administered 2020-06-20: 25 mg via ORAL

## 2020-06-20 MED ORDER — SODIUM CHLORIDE 0.9 % IV SOLN
20.0000 mg | Freq: Once | INTRAVENOUS | Status: AC
  Administered 2020-06-20: 20 mg via INTRAVENOUS
  Filled 2020-06-20: qty 20

## 2020-06-20 MED ORDER — ACETAMINOPHEN 500 MG PO TABS
1000.0000 mg | ORAL_TABLET | Freq: Once | ORAL | Status: AC
  Administered 2020-06-20: 1000 mg via ORAL

## 2020-06-20 MED ORDER — ACETAMINOPHEN 500 MG PO TABS
ORAL_TABLET | ORAL | Status: AC
  Filled 2020-06-20: qty 2

## 2020-06-20 NOTE — Patient Instructions (Signed)

## 2020-06-20 NOTE — Patient Instructions (Signed)
[facility] Discharge Instructions for Patients Receiving Chemotherapy  Today you received the following chemotherapy agents: Carfilzomib (Kyprolis)  To help prevent nausea and vomiting after your treatment, we encourage you to take your nausea medication as directed by your provider   If you develop nausea and vomiting that is not controlled by your nausea medication, call the clinic.   BELOW ARE SYMPTOMS THAT SHOULD BE REPORTED IMMEDIATELY:  *FEVER GREATER THAN 100.5 F  *CHILLS WITH OR WITHOUT FEVER  NAUSEA AND VOMITING THAT IS NOT CONTROLLED WITH YOUR NAUSEA MEDICATION  *UNUSUAL SHORTNESS OF BREATH  *UNUSUAL BRUISING OR BLEEDING  TENDERNESS IN MOUTH AND THROAT WITH OR WITHOUT PRESENCE OF ULCERS  *URINARY PROBLEMS  *BOWEL PROBLEMS  UNUSUAL RASH Items with * indicate a potential emergency and should be followed up as soon as possible.  Feel free to call the clinic should you have any questions or concerns. The clinic phone number is [phone].  Please show the [place] at check-in to the Emergency Department and triage nurse.

## 2020-06-21 ENCOUNTER — Ambulatory Visit: Payer: BC Managed Care – PPO

## 2020-06-21 ENCOUNTER — Inpatient Hospital Stay: Payer: BC Managed Care – PPO

## 2020-06-21 VITALS — BP 129/64 | HR 80 | Temp 98.2°F | Resp 14

## 2020-06-21 DIAGNOSIS — C9000 Multiple myeloma not having achieved remission: Secondary | ICD-10-CM

## 2020-06-21 DIAGNOSIS — Z7189 Other specified counseling: Secondary | ICD-10-CM

## 2020-06-21 DIAGNOSIS — Z5112 Encounter for antineoplastic immunotherapy: Secondary | ICD-10-CM | POA: Diagnosis not present

## 2020-06-21 LAB — KAPPA/LAMBDA LIGHT CHAINS
Kappa free light chain: 70.8 mg/L — ABNORMAL HIGH (ref 3.3–19.4)
Kappa, lambda light chain ratio: 26.22 — ABNORMAL HIGH (ref 0.26–1.65)
Lambda free light chains: 2.7 mg/L — ABNORMAL LOW (ref 5.7–26.3)

## 2020-06-21 MED ORDER — FAMOTIDINE IN NACL 20-0.9 MG/50ML-% IV SOLN
INTRAVENOUS | Status: AC
  Filled 2020-06-21: qty 50

## 2020-06-21 MED ORDER — SODIUM CHLORIDE 0.9 % IV SOLN
Freq: Once | INTRAVENOUS | Status: AC
  Filled 2020-06-21: qty 250

## 2020-06-21 MED ORDER — FAMOTIDINE 20 MG PO TABS: 20.0000 mg | ORAL_TABLET | Freq: Once | ORAL | Status: DC

## 2020-06-21 MED ORDER — SODIUM CHLORIDE 0.9% FLUSH
10.0000 mL | INTRAVENOUS | Status: DC | PRN
  Administered 2020-06-21: 10 mL
  Filled 2020-06-21: qty 10

## 2020-06-21 MED ORDER — DIPHENHYDRAMINE HCL 25 MG PO CAPS
ORAL_CAPSULE | ORAL | Status: AC
  Filled 2020-06-21: qty 1

## 2020-06-21 MED ORDER — DEXTROSE 5 % IV SOLN
20.0000 mg/m2 | Freq: Once | INTRAVENOUS | Status: AC
  Administered 2020-06-21: 34 mg via INTRAVENOUS
  Filled 2020-06-21: qty 15

## 2020-06-21 MED ORDER — SODIUM CHLORIDE 0.9 % IV SOLN
20.0000 mg | Freq: Once | INTRAVENOUS | Status: AC
  Administered 2020-06-21: 20 mg via INTRAVENOUS
  Filled 2020-06-21: qty 20

## 2020-06-21 MED ORDER — ACETAMINOPHEN 500 MG PO TABS
1000.0000 mg | ORAL_TABLET | Freq: Once | ORAL | Status: AC
  Administered 2020-06-21: 1000 mg via ORAL

## 2020-06-21 MED ORDER — DEXAMETHASONE SODIUM PHOSPHATE 4 MG/ML IJ SOLN: 20.0000 mg | Freq: Once | INTRAMUSCULAR | Status: DC

## 2020-06-21 MED ORDER — HEPARIN SOD (PORK) LOCK FLUSH 100 UNIT/ML IV SOLN
500.0000 [IU] | Freq: Once | INTRAVENOUS | Status: AC | PRN
  Administered 2020-06-21: 500 [IU]
  Filled 2020-06-21: qty 5

## 2020-06-21 MED ORDER — ACETAMINOPHEN 500 MG PO TABS
ORAL_TABLET | ORAL | Status: AC
  Filled 2020-06-21: qty 2

## 2020-06-21 MED ORDER — DIPHENHYDRAMINE HCL 25 MG PO CAPS
25.0000 mg | ORAL_CAPSULE | Freq: Once | ORAL | Status: AC
  Administered 2020-06-21: 25 mg via ORAL

## 2020-06-21 NOTE — Patient Instructions (Signed)
[facility] Discharge Instructions for Patients Receiving Chemotherapy  Today you received the following chemotherapy agents kyprolis  To help prevent nausea and vomiting after your treatment, we encourage you to take your nausea medication as directed.   If you develop nausea and vomiting that is not controlled by your nausea medication, call the clinic.   BELOW ARE SYMPTOMS THAT SHOULD BE REPORTED IMMEDIATELY:  *FEVER GREATER THAN 100.5 F  *CHILLS WITH OR WITHOUT FEVER  NAUSEA AND VOMITING THAT IS NOT CONTROLLED WITH YOUR NAUSEA MEDICATION  *UNUSUAL SHORTNESS OF BREATH  *UNUSUAL BRUISING OR BLEEDING  TENDERNESS IN MOUTH AND THROAT WITH OR WITHOUT PRESENCE OF ULCERS  *URINARY PROBLEMS  *BOWEL PROBLEMS  UNUSUAL RASH Items with * indicate a potential emergency and should be followed up as soon as possible.  Feel free to call the clinic should you have any questions or concerns. The clinic phone number is [phone].  Please show the CHEMO ALERT CARD at check-in to the Emergency Department and triage nurse.   

## 2020-06-24 LAB — MULTIPLE MYELOMA PANEL, SERUM
Albumin SerPl Elph-Mcnc: 3.1 g/dL (ref 2.9–4.4)
Albumin/Glob SerPl: 0.6 — ABNORMAL LOW (ref 0.7–1.7)
Alpha 1: 0.2 g/dL (ref 0.0–0.4)
Alpha2 Glob SerPl Elph-Mcnc: 0.7 g/dL (ref 0.4–1.0)
B-Globulin SerPl Elph-Mcnc: 3.9 g/dL — ABNORMAL HIGH (ref 0.7–1.3)
Gamma Glob SerPl Elph-Mcnc: 0.5 g/dL (ref 0.4–1.8)
Globulin, Total: 5.3 g/dL — ABNORMAL HIGH (ref 2.2–3.9)
IgA: 5089 mg/dL — ABNORMAL HIGH (ref 87–352)
IgG (Immunoglobin G), Serum: 164 mg/dL — ABNORMAL LOW (ref 586–1602)
IgM (Immunoglobulin M), Srm: 16 mg/dL — ABNORMAL LOW (ref 26–217)
M Protein SerPl Elph-Mcnc: 3 g/dL — ABNORMAL HIGH
Total Protein ELP: 8.4 g/dL (ref 6.0–8.5)

## 2020-06-25 ENCOUNTER — Telehealth: Payer: Self-pay | Admitting: Hematology

## 2020-06-25 NOTE — Telephone Encounter (Signed)
Scheduled follow-up appointments per 3/17 los. Patient is aware. ?

## 2020-06-26 ENCOUNTER — Other Ambulatory Visit: Payer: Self-pay | Admitting: Hematology

## 2020-06-26 DIAGNOSIS — Z7189 Other specified counseling: Secondary | ICD-10-CM

## 2020-06-26 DIAGNOSIS — C9000 Multiple myeloma not having achieved remission: Secondary | ICD-10-CM

## 2020-07-01 ENCOUNTER — Other Ambulatory Visit: Payer: Self-pay | Admitting: Hematology

## 2020-07-01 DIAGNOSIS — Z7189 Other specified counseling: Secondary | ICD-10-CM

## 2020-07-01 DIAGNOSIS — C9000 Multiple myeloma not having achieved remission: Secondary | ICD-10-CM

## 2020-07-03 NOTE — Progress Notes (Signed)
HEMATOLOGY/ONCOLOGY CONSULTATION NOTE  Date of Service: 07/04/2020  Patient Care Team: [name], MD as PCP - General (Family Medicine) [name], [name], MD as PCP - Cardiology (Cardiology) [name] [name], MD as Consulting Physician (Gastroenterology)  CHIEF COMPLAINTS/PURPOSE OF CONSULTATION:  Multiple Myeloma  HISTORY OF PRESENTING ILLNESS:   Megan Velasquez is a wonderful 60 y.o. female who has been referred to [place] for evaluation and management of multiple myeloma. Pt is accompanied today by her husband for this visit.   Patient was referred to [place] for second opinion regarding management of newly diagnosed multiple myeloma by Dr. Laurena Slimmer MD.  Patient had been referred to to [facility] by her primary care physician for anemia and elevated serum protein levels.  She also had some increasing fatigue and tingling in her hands and feet.  Noted primarily in the right wrist and right ankle.  B12 levels were noted to be borderline low.  She had a work-up including labs which showed hemoglobin of 9.7 with an MCV of 86.9 WBC count of 6.95k and platelets of 165k. CMP showed a creatinine of 1.06 calcium level of 8.9 total protein of 10.2 with an albumin of 2.7 normal liver function tests and other electrolytes. Serum protein electrophoresis with an M spike of 4.3 with an additional M spike of 0.2 g/dL.  IgA quantitative more than 6400. B12 levels 276, homocystine 15.9 Serum folate 26.5 LDH 96 Beta-2 microglobulin of 2.5  Serum kappa lambda free light chains showed free kappa light chains of 181, free lambda light chains of 3.6 and a free kappa lambda ratio of 50.3.  24-hour UPEP showed no M spike and a total protein of 5.7 mg/dL.  Bone Survey completed on 03/08/2020 with results revealing "No evidence of lytic nor blastic lesions within the appendicular or axial skeleton. Degenerative changes as described above."   On review of systems, pt reports some mild fatigue.  No  focal bone pains.  No chest pain.  No shortness of breath. No evidence of GI bleeding no hematuria no nosebleeds or gum bleeds. No other acute new focal symptoms. Denies any history of known cardiopulmonary disease strokes or heart attacks.  INTERVAL HISTORY  Megan Velasquez is a wonderful 60 y.o. female who is here today for evaluation and management of multiple myeloma. The patient's last visit with [place] was on 06/20/2020. The pt reports that she is doing well overall. We are joined today by her husband. She is here for toxicity check prior to [place].  The pt reports intermittent abdominal pain when she is active, but goes away once she relaxes. The pt notes this is unrelated to any food intake. The pt notes that with the last treatment without Pepcid, it made her very gaseous and heart burn. The pt has been taking the Tylenol when home. The pt notes she has been eating and sleeping well.  Lab results today 07/04/2020 of CBC w/diff and CMP is as follows: all values are WNL except for RBC of 3.82, Hgb of 10.3, HCT of 32.6, RDW of 19.2, BUN of 25, Calcium of 8.5, AST of 13. 07/04/2020 MMP in progress. 07/04/2020 Light Chains in progress.  On review of systems, pt reports intermittent abdominal pain and denies fevers, chills, night sweats, leg swelling, and any other symptoms.  MEDICAL HISTORY:  Past Medical History:  Diagnosis Date  . Cancer ([place])   . GERD (gastroesophageal reflux disease)   . History of kidney stones   .  HTN (hypertension)   . Renal disorder     SURGICAL HISTORY: Past Surgical History:  Procedure Laterality Date  . CHOLECYSTECTOMY    . COLONOSCOPY  01/2015   Dr. Britta Mccreedy: Mild diverticulosis, sessile polyp ranging 3 to 5 mm removed from the proximal transverse colon, semi-pedunculated polyp 5 to 9 mm in size removed from the sigmoid colon.  Sigmoid colon polyp was serrated adenoma, transverse colon polyp was adenomatous.  Patient was told to have another colonoscopy in 3  years.  . COLONOSCOPY WITH PROPOFOL N/A 10/11/2017   Procedure: COLONOSCOPY WITH PROPOFOL;  Surgeon: Daneil Dolin, MD;  Location: [facility];  Service: Endoscopy;  Laterality: N/A;  1:15pm  . IR IMAGING GUIDED PORT INSERTION  06/05/2020  . POLYPECTOMY  10/11/2017   Procedure: POLYPECTOMY;  Surgeon: Daneil Dolin, MD;  Location: [facility];  Service: Endoscopy;;  descending colon polyps cs times 2    SOCIAL HISTORY: Social History   Socioeconomic History  . Marital status: Married    Spouse name: Not on file  . Number of children: Not on file  . Years of education: Not on file  . Highest education level: Not on file  Occupational History  . Not on file  Tobacco Use  . Smoking status: Former Smoker    Packs/day: 1.00    Years: 28.00    Pack years: 28.00    Types: Cigarettes    Quit date: 10/08/2013    Years since quitting: 6.7  . Smokeless tobacco: Never Used  Vaping Use  . Vaping Use: Never used  Substance and Sexual Activity  . Alcohol use: Not Currently  . Drug use: Never  . Sexual activity: Yes    Birth control/protection: Post-menopausal  Other Topics Concern  . Not on file  Social History Narrative  . Not on file   Social Determinants of Health   Financial Resource Strain: Low Risk   . Difficulty of Paying Living Expenses: Not hard at all  Food Insecurity: No Food Insecurity  . Worried About Charity fundraiser in the Last Year: Never true  . Ran Out of Food in the Last Year: Never true  Transportation Needs: No Transportation Needs  . Lack of Transportation (Medical): No  . Lack of Transportation (Non-Medical): No  Physical Activity: Not on file  Stress: No Stress Concern Present  . Feeling of Stress : Only a little  Social Connections: Unknown  . Frequency of Communication with Friends and Family: More than three times a week  . Frequency of Social Gatherings with Friends and Family: More than three times a week  . Attends Religious Services: Not on  file  . Active Member of Clubs or Organizations: Not on file  . Attends Archivist Meetings: Not on file  . Marital Status: Married  Human resources officer Violence: Not on file    FAMILY HISTORY: Family History  Problem Relation Age of Onset  . Heart disease Mother   . Diabetes Mother   . Lung cancer Father   . COPD Father   . Colon cancer Neg Hx     ALLERGIES:  has No Known Allergies.  MEDICATIONS:  Current Outpatient Medications  Medication Sig Dispense Refill  . acetaminophen (TYLENOL) 500 MG tablet Take 1,000 mg by mouth every 6 (six) hours as needed for fever or headache.    Marland Kitchen acyclovir (ZOVIRAX) 400 MG tablet TAKE 1 TABLET BY MOUTH TWICE A DAY 30 tablet 2  . apixaban (ELIQUIS) 2.5  MG TABS tablet Take 1 tablet (2.5 mg total) by mouth 2 (two) times daily. 60 tablet 5  . Ascorbic Acid (VITAMIN C) 1000 MG tablet Take 1,000 mg by mouth every morning.    Marland Kitchen b complex vitamins capsule Take 1 capsule by mouth daily. (Patient taking differently: Take 1 capsule by mouth every morning.) 30 capsule 3  . Cholecalciferol (VITAMIN D3) 5000 units CAPS Take 5,000 Units by mouth every morning.    . Cyanocobalamin (VITAMIN B12) 3000 MCG SUBL Place 3,000 mcg under the tongue every morning.    Marland Kitchen dexamethasone (DECADRON) 4 MG tablet Take 10 tablets (90m) once on day 22. Repeat every 28 days for the first 4 cycles. Take with breakfast. (Patient taking differently: Take 40 mg by mouth See admin instructions. Take 10 tablets (414m once on day 22 after start of chemo.  Repeat every 28 days for the first 4 cycles. Take with breakfast.) 40 tablet 4  . lenalidomide (REVLIMID) 15 MG capsule Take 1 capsule (15 mg total) by mouth See admin instructions. Take one capsule (15 mg) by mouth daily at bedtime for 21 days. Hold 7 days. Repeat every 28 days. 21 capsule 0  . lidocaine-prilocaine (EMLA) cream Apply to affected area once (Patient taking differently: Apply 1 application topically once as needed  (prior to port access).) 30 g 3  . loratadine (CLARITIN) 10 MG tablet Take 10 mg by mouth every morning.    . Marland KitchenORazepam (ATIVAN) 0.5 MG tablet Take 1 tablet (0.5 mg total) by mouth every 6 (six) hours as needed (Nausea or vomiting). 30 tablet 0  . omeprazole (PRILOSEC) 40 MG capsule Take 40 mg by mouth daily.    . ondansetron (ZOFRAN) 8 MG tablet Take 1 tablet (8 mg total) by mouth 2 (two) times daily as needed (Nausea or vomiting). 30 tablet 1  . prochlorperazine (COMPAZINE) 10 MG tablet Take 1 tablet (10 mg total) by mouth every 6 (six) hours as needed (Nausea or vomiting). 30 tablet 1   No current facility-administered medications for this visit.    REVIEW OF SYSTEMS:   10 Point review of Systems was done is negative except as noted above.  PHYSICAL EXAMINATION: ECOG PERFORMANCE STATUS: 1 - Symptomatic but completely ambulatory  . Vitals:   07/04/20 0904 07/04/20 0907  BP: (!) 145/70 136/77  Pulse: 76   Resp: 18   Temp: 97.6 F (36.4 C)   SpO2: 98%    Filed Weights   07/04/20 0904  Weight: 134 lb (60.8 kg)   .Body mass index is 22.3 kg/m.   GENERAL:alert, in no acute distress and comfortable SKIN: no acute rashes, no significant lesions EYES: conjunctiva are pink and non-injected, sclera anicteric OROPHARYNX: MMM, no exudates, no oropharyngeal erythema or ulceration NECK: supple, no JVD LYMPH:  no palpable lymphadenopathy in the cervical, axillary or inguinal regions LUNGS: clear to auscultation b/l with normal respiratory effort HEART: regular rate & rhythm ABDOMEN:  normoactive bowel sounds , non tender, not distended. Extremity: no pedal edema PSYCH: alert & oriented x 3 with fluent speech NEURO: no focal motor/sensory deficits  LABORATORY DATA:  I have reviewed the data as listed  . CBC Latest Ref Rng & Units 07/04/2020 06/20/2020 06/14/2020  WBC 4.0 - 10.5 K/uL 6.4 5.0 5.0  Hemoglobin 12.0 - 15.0 g/dL 10.3(L) 10.5(L) 9.2(L)  Hematocrit 36.0 - 46.0 % 32.6(L)  32.7(L) 29.8(L)  Platelets 150 - 400 K/uL 174 297 106(L)    . CMP Latest Ref Rng & Units  06/20/2020 06/14/2020 06/13/2020  Glucose 70 - 99 mg/dL 115(H) 89 120(H)  BUN 6 - 20 mg/dL 23(H) 20 15  Creatinine 0.44 - 1.00 mg/dL 0.88 0.69 0.58  Sodium 135 - 145 mmol/L 136 139 136  Potassium 3.5 - 5.1 mmol/L 4.2 3.8 3.9  Chloride 98 - 111 mmol/L 101 105 106  CO2 22 - 32 mmol/L 24 23 22   Calcium 8.9 - 10.3 mg/dL 8.6(L) 7.9(L) 7.9(L)  Total Protein 6.5 - 8.1 g/dL 9.1(H) - 7.2  Total Bilirubin 0.3 - 1.2 mg/dL 0.4 - 0.5  Alkaline Phos 38 - 126 U/L 56 - 38  AST 15 - 41 U/L 15 - 23  ALT 0 - 44 U/L 27 - 43     Most recent lab results (03/15/2020) of CBC is as follows: all values are WNL except for RBC at 3.66, Hgb at 9.7, HCT at 31.8, MCHC at 30.5, RDW Standard Deviation 58.7, Red Cell Distribution Width At 18.6,  BUN at 19, Creatinine at 1.06, Total Protein at 10.2, Albumin at 2.7, AST at 14,. 03/13/2020 UPEP shows no M-Spike, all values are WNL 03/06/2020 SPEP shows Total Protein at 10.1, Beta Globulin at 5.2, M-Spike at 4.3, Total Globulin at 6.4, A/G Ratio at 0.6 03/06/2020 Free Kappa Light Chains at 181.1, Free Lambda Light Chains Serum at 3.6, K/L ratio at 50.31 03/06/2020 Beta-2 Microglobulin at 2.5           RADIOGRAPHIC STUDIES: I have personally reviewed the radiological images as listed and agreed with the findings in the report. CT ANGIO CHEST PE W OR WO CONTRAST  Result Date: 06/12/2020 CLINICAL DATA:  Shortness of breath for 3 days EXAM: CT ANGIOGRAPHY CHEST WITH CONTRAST TECHNIQUE: Multidetector CT imaging of the chest was performed using the standard protocol during bolus administration of intravenous contrast. Multiplanar CT image reconstructions and MIPs were obtained to evaluate the vascular anatomy. CONTRAST:  74m OMNIPAQUE IOHEXOL 350 MG/ML SOLN COMPARISON:  Radiograph June 09, 2020 FINDINGS: Cardiovascular: There is a optimal opacification of the pulmonary arteries. There  is no central,segmental, or subsegmental filling defects within the pulmonary arteries. The heart is normal in size. No pericardial effusion or thickening. No evidence right heart strain. There is normal three-vessel brachiocephalic anatomy without proximal stenosis. Scattered aortic atherosclerosis is noted. Mediastinum/Nodes: No hilar, mediastinal, or axillary adenopathy. Thyroid gland, trachea, and esophagus demonstrate no significant findings. Right-sided MediPort catheter seen with the tip at the superior cavoatrial junction. Lungs/Pleura: The lungs are clear. No pleural effusion or pneumothorax. No airspace consolidation. Upper Abdomen: No acute abnormalities present in the visualized portions of the upper abdomen. Diffuse low density seen throughout the right Musculoskeletal: No chest wall abnormality. No acute or significant osseous findings. Review of the MIP images confirms the above findings. IMPRESSION: No central, segmental, or subsegmental pulmonary embolism No acute intrathoracic pathology to explain the patient's symptoms Aortic Atherosclerosis (ICD10-I70.0). Electronically Signed   By: BPrudencio PairM.D.   On: 06/12/2020 12:10   DG Chest Port 1 View  Result Date: 06/09/2020 CLINICAL DATA:  Fever and shortness of breath. EXAM: PORTABLE CHEST 1 VIEW COMPARISON:  None. FINDINGS: The cardiomediastinal silhouette is unremarkable. A RIGHT Port-A-Cath is noted with tip overlying the LOWER SVC. There is no evidence of focal airspace disease, pulmonary edema, suspicious pulmonary nodule/mass, pleural effusion, or pneumothorax. No acute bony abnormalities are identified. IMPRESSION: No active disease. Electronically Signed   By: JMargarette CanadaM.D.   On: 06/09/2020 13:03   IR IMAGING GUIDED PORT INSERTION  Result Date: 06/05/2020 INDICATION: 60 year old female with history of multiple myeloma requiring central venous access for chemotherapy. EXAM: IMPLANTED PORT A CATH PLACEMENT WITH ULTRASOUND AND  FLUOROSCOPIC GUIDANCE COMPARISON:  None. MEDICATIONS: None. ANESTHESIA/SEDATION: Moderate (conscious) sedation was employed during this procedure. A total of Versed 4 mg and Fentanyl 100 mcg was administered intravenously. Moderate Sedation Time: 15 minutes. The patient's level of consciousness and vital signs were monitored continuously by radiology nursing throughout the procedure under my direct supervision. CONTRAST:  None FLUOROSCOPY TIME:  0 minutes, 6 seconds (1 mGy) COMPLICATIONS: None immediate. PROCEDURE: The procedure, risks, benefits, and alternatives were explained to the patient. Questions regarding the procedure were encouraged and answered. The patient understands and consents to the procedure. The right neck and chest were prepped with chlorhexidine in a sterile fashion, and a sterile drape was applied covering the operative field. Maximum barrier sterile technique with sterile gowns and gloves were used for the procedure. A timeout was performed prior to the initiation of the procedure. Ultrasound was used to examine the jugular vein which was compressible and free of internal echoes. A skin marker was used to demarcate the planned venotomy and port pocket incision sites. Local anesthesia was provided to these sites and the subcutaneous tunnel track with 1% lidocaine with 1:100,000 epinephrine. A small incision was created at the jugular access site and blunt dissection was performed of the subcutaneous tissues. Under real time ultrasound guidance, the jugular vein was accessed with a 21 ga micropuncture needle and an 0.018" wire was inserted to the superior vena cava. A 5 Fr micopuncture set was then used, through which a 0.035" Rosen wire was passed under fluoroscopic guidance into the inferior vena cava. An 8 Fr dilator was then placed over the wire. A subcutaneous port pocket was then created along the upper chest wall utilizing a combination of sharp and blunt dissection. The pocket was  irrigated with sterile saline, packed with gauze, and observed for hemorrhage. A single lumen "ISP" sized power injectable port was chosen for placement. The 8 Fr catheter was tunneled from the port pocket site to the venotomy incision. The port was placed in the pocket. The external catheter was trimmed to appropriate length. The dilator was exchanged for an 8 Fr peel-away sheath under fluoroscopic guidance. The catheter was then placed through the sheath and the sheath was removed. Final catheter positioning was confirmed and documented with a fluoroscopic spot radiograph. The port was accessed with a Huber needle, aspirated, and flushed with heparinized saline. The deep dermal layer of the port pocket incision was closed with interrupted 3-0 Vicryl suture. The skin was opposed with a running subcuticular 4-0 Monocryl suture. Dermabond was then placed over the port pocket and neck incisions. The patient tolerated the procedure well without immediate post procedural complication. FINDINGS: After catheter placement, the tip lies within the cavoatrial junction the catheter aspirates and flushes normally and is ready for immediate use. IMPRESSION: Successful placement of a power injectable Port-A-Cath via the right internal jugular vein. The catheter is ready for immediate use. Ruthann Cancer, MD [facility] Electronically Signed   By: Ruthann Cancer MD   On: 06/05/2020 15:51    ASSESSMENT & PLAN:   60 year old female with  #1 Newly diagnosed multiple myeloma -IgA kappa.  Presenting with anemia hemoglobin today 9.4. Creatinine 1.28 No hypercalcemia Bone survey with no obvious skeletal lesions. Baseline M spike of 4.3 g/dL. Beta-2 Microglobulin at 2.5 Mol Cy Monosomy 13 and  dup 1q   PLAN: -Discussed pt labwork today, 07/04/2020; blood counts normal, Plt improved. Chemistries normal. Other labs pending. -Advised pt that her m-protein was slow  escalating on the week we held treatment. Will continue to monitor. -Continue steroids and tylenol prior to treatment to reduce drug-related fevers. -Advised pt to take 650 mg Tylenol once home from treatment days. -Recommended pt stay physically active, drink 48-64 oz water daily, and eat healthy. Make sure to get rest. -Advised pt we want to get back on Revlimid as soon as possible, but still be able to monitor dose escalation of Carfilzomib.  -Will start Revlimid on D8 next week for a total of 14 days. Starting with C3, will do D1-21 as scheduled. -Will increase Carfilzomib dose to 27 mg/m^2 today and tomorrow (D1/2). Will increase to 36 mg/m^2 starting next week (D3-on). -Continue 2.5 mg Eliquis BID. -Will see back in 1 week with labs in infusion.   FOLLOW UP: Plz add MD visit to see patient in infusion with treatment on 4/7. Okay to double book    All of the patients questions were answered with apparent satisfaction. The patient knows to call the clinic with any problems, questions or concerns.    The total time spent in the appointment was 30 minutes and more than 50% was on counseling and direct patient cares., ordering and mx of chemotherapy    Sullivan Lone MD MS AAHIVMS [facility] Hematology/Oncology Physician [facility]  (Office):       [phone] (Work cell):  [phone] (Fax):           [phone]  07/04/2020 9:13 AM  I, Reinaldo Raddle, am acting as scribe for Dr. Sullivan Lone, MD.   .I have reviewed the above documentation for accuracy and completeness, and I agree with the above. Brunetta Genera MD

## 2020-07-04 ENCOUNTER — Other Ambulatory Visit: Payer: Self-pay

## 2020-07-04 ENCOUNTER — Inpatient Hospital Stay: Payer: BC Managed Care – PPO | Attending: Hematology

## 2020-07-04 ENCOUNTER — Inpatient Hospital Stay: Payer: BC Managed Care – PPO

## 2020-07-04 ENCOUNTER — Inpatient Hospital Stay (HOSPITAL_BASED_OUTPATIENT_CLINIC_OR_DEPARTMENT_OTHER): Payer: BC Managed Care – PPO | Admitting: Hematology

## 2020-07-04 VITALS — BP 136/77 | HR 76 | Temp 97.6°F | Resp 18 | Ht 65.0 in | Wt 134.0 lb

## 2020-07-04 DIAGNOSIS — C9000 Multiple myeloma not having achieved remission: Secondary | ICD-10-CM | POA: Diagnosis not present

## 2020-07-04 DIAGNOSIS — Z79899 Other long term (current) drug therapy: Secondary | ICD-10-CM | POA: Insufficient documentation

## 2020-07-04 DIAGNOSIS — Z5112 Encounter for antineoplastic immunotherapy: Secondary | ICD-10-CM | POA: Insufficient documentation

## 2020-07-04 DIAGNOSIS — Z5111 Encounter for antineoplastic chemotherapy: Secondary | ICD-10-CM | POA: Diagnosis not present

## 2020-07-04 DIAGNOSIS — Z95828 Presence of other vascular implants and grafts: Secondary | ICD-10-CM

## 2020-07-04 DIAGNOSIS — Z7189 Other specified counseling: Secondary | ICD-10-CM

## 2020-07-04 LAB — CBC WITH DIFFERENTIAL/PLATELET
Abs Immature Granulocytes: 0.02 10*3/uL (ref 0.00–0.07)
Basophils Absolute: 0 10*3/uL (ref 0.0–0.1)
Basophils Relative: 1 %
Eosinophils Absolute: 0.1 10*3/uL (ref 0.0–0.5)
Eosinophils Relative: 2 %
HCT: 32.6 % — ABNORMAL LOW (ref 36.0–46.0)
Hemoglobin: 10.3 g/dL — ABNORMAL LOW (ref 12.0–15.0)
Immature Granulocytes: 0 %
Lymphocytes Relative: 33 %
Lymphs Abs: 2.1 10*3/uL (ref 0.7–4.0)
MCH: 27 pg (ref 26.0–34.0)
MCHC: 31.6 g/dL (ref 30.0–36.0)
MCV: 85.3 fL (ref 80.0–100.0)
Monocytes Absolute: 0.6 10*3/uL (ref 0.1–1.0)
Monocytes Relative: 10 %
Neutro Abs: 3.5 10*3/uL (ref 1.7–7.7)
Neutrophils Relative %: 54 %
Platelets: 174 10*3/uL (ref 150–400)
RBC: 3.82 MIL/uL — ABNORMAL LOW (ref 3.87–5.11)
RDW: 19.2 % — ABNORMAL HIGH (ref 11.5–15.5)
WBC: 6.4 10*3/uL (ref 4.0–10.5)
nRBC: 0 % (ref 0.0–0.2)

## 2020-07-04 LAB — CMP (CANCER CENTER ONLY)
ALT: 12 U/L (ref 0–44)
AST: 13 U/L — ABNORMAL LOW (ref 15–41)
Albumin: 3.5 g/dL (ref 3.5–5.0)
Alkaline Phosphatase: 92 U/L (ref 38–126)
Anion gap: 11 (ref 5–15)
BUN: 25 mg/dL — ABNORMAL HIGH (ref 6–20)
CO2: 24 mmol/L (ref 22–32)
Calcium: 8.5 mg/dL — ABNORMAL LOW (ref 8.9–10.3)
Chloride: 105 mmol/L (ref 98–111)
Creatinine: 0.94 mg/dL (ref 0.44–1.00)
GFR, Estimated: >60 mL/min (ref >60)
Glucose, Bld: 92 mg/dL (ref 70–99)
Potassium: 4.4 mmol/L (ref 3.5–5.1)
Sodium: 140 mmol/L (ref 135–145)
Total Bilirubin: 0.5 mg/dL (ref 0.3–1.2)
Total Protein: 7.9 g/dL (ref 6.5–8.1)

## 2020-07-04 MED ORDER — DIPHENHYDRAMINE HCL 25 MG PO CAPS
25.0000 mg | ORAL_CAPSULE | Freq: Once | ORAL | Status: AC
  Administered 2020-07-04: 25 mg via ORAL

## 2020-07-04 MED ORDER — ACETAMINOPHEN 500 MG PO TABS
ORAL_TABLET | ORAL | Status: AC
  Filled 2020-07-04: qty 2

## 2020-07-04 MED ORDER — SODIUM CHLORIDE 0.9 % IV SOLN
Freq: Once | INTRAVENOUS | Status: AC
  Filled 2020-07-04: qty 250

## 2020-07-04 MED ORDER — SODIUM CHLORIDE 0.9 % IV SOLN
20.0000 mg | Freq: Once | INTRAVENOUS | Status: AC
  Administered 2020-07-04: 20 mg via INTRAVENOUS
  Filled 2020-07-04: qty 20

## 2020-07-04 MED ORDER — FAMOTIDINE 20 MG PO TABS
20.0000 mg | ORAL_TABLET | Freq: Once | ORAL | Status: AC
  Administered 2020-07-04: 20 mg via ORAL

## 2020-07-04 MED ORDER — DEXTROSE 5 % IV SOLN
27.0000 mg/m2 | Freq: Once | INTRAVENOUS | Status: AC
  Administered 2020-07-04: 46 mg via INTRAVENOUS
  Filled 2020-07-04: qty 15

## 2020-07-04 MED ORDER — FAMOTIDINE 20 MG PO TABS
ORAL_TABLET | ORAL | Status: AC
  Filled 2020-07-04: qty 1

## 2020-07-04 MED ORDER — SODIUM CHLORIDE 0.9% FLUSH
10.0000 mL | Freq: Once | INTRAVENOUS | Status: AC
  Administered 2020-07-04: 10 mL
  Filled 2020-07-04: qty 10

## 2020-07-04 MED ORDER — ACETAMINOPHEN 500 MG PO TABS
1000.0000 mg | ORAL_TABLET | Freq: Once | ORAL | Status: AC
  Administered 2020-07-04: 1000 mg via ORAL

## 2020-07-04 MED ORDER — DIPHENHYDRAMINE HCL 25 MG PO CAPS
ORAL_CAPSULE | ORAL | Status: AC
  Filled 2020-07-04: qty 2

## 2020-07-04 NOTE — Patient Instructions (Signed)

## 2020-07-04 NOTE — Patient Instructions (Signed)
[place] [facility] Discharge Instructions for Patients Receiving Chemotherapy  Today you received the following chemotherapy agents kyprolis  To help prevent nausea and vomiting after your treatment, we encourage you to take your nausea medication as directed.  If you develop nausea and vomiting that is not controlled by your nausea medication, call the clinic.   BELOW ARE SYMPTOMS THAT SHOULD BE REPORTED IMMEDIATELY:  *FEVER GREATER THAN 100.5 F  *CHILLS WITH OR WITHOUT FEVER  NAUSEA AND VOMITING THAT IS NOT CONTROLLED WITH YOUR NAUSEA MEDICATION  *UNUSUAL SHORTNESS OF BREATH  *UNUSUAL BRUISING OR BLEEDING  TENDERNESS IN MOUTH AND THROAT WITH OR WITHOUT PRESENCE OF ULCERS  *URINARY PROBLEMS  *BOWEL PROBLEMS  UNUSUAL RASH Items with * indicate a potential emergency and should be followed up as soon as possible.  Feel free to call the clinic should you have any questions or concerns. The clinic phone number is [phone].  Please show the [place] at check-in to the Emergency Department and triage nurse.

## 2020-07-05 ENCOUNTER — Telehealth: Payer: Self-pay | Admitting: Hematology

## 2020-07-05 ENCOUNTER — Inpatient Hospital Stay: Payer: BC Managed Care – PPO | Attending: Hematology

## 2020-07-05 VITALS — BP 134/77 | HR 72 | Temp 98.2°F | Resp 18

## 2020-07-05 DIAGNOSIS — Z7189 Other specified counseling: Secondary | ICD-10-CM

## 2020-07-05 DIAGNOSIS — D649 Anemia, unspecified: Secondary | ICD-10-CM | POA: Diagnosis not present

## 2020-07-05 DIAGNOSIS — Z5112 Encounter for antineoplastic immunotherapy: Secondary | ICD-10-CM | POA: Insufficient documentation

## 2020-07-05 DIAGNOSIS — C9000 Multiple myeloma not having achieved remission: Secondary | ICD-10-CM | POA: Insufficient documentation

## 2020-07-05 DIAGNOSIS — Z79899 Other long term (current) drug therapy: Secondary | ICD-10-CM | POA: Insufficient documentation

## 2020-07-05 LAB — KAPPA/LAMBDA LIGHT CHAINS
Kappa free light chain: 29.3 mg/L — ABNORMAL HIGH (ref 3.3–19.4)
Kappa, lambda light chain ratio: 12.21 — ABNORMAL HIGH (ref 0.26–1.65)
Lambda free light chains: 2.4 mg/L — ABNORMAL LOW (ref 5.7–26.3)

## 2020-07-05 MED ORDER — SODIUM CHLORIDE 0.9 % IV SOLN
Freq: Once | INTRAVENOUS | Status: AC
  Filled 2020-07-05: qty 250

## 2020-07-05 MED ORDER — HEPARIN SOD (PORK) LOCK FLUSH 100 UNIT/ML IV SOLN
500.0000 [IU] | Freq: Once | INTRAVENOUS | Status: AC | PRN
  Administered 2020-07-05: 500 [IU]
  Filled 2020-07-05: qty 5

## 2020-07-05 MED ORDER — SODIUM CHLORIDE 0.9% FLUSH
10.0000 mL | INTRAVENOUS | Status: DC | PRN
  Administered 2020-07-05: 10 mL
  Filled 2020-07-05: qty 10

## 2020-07-05 MED ORDER — SODIUM CHLORIDE 0.9 % IV SOLN
Freq: Once | INTRAVENOUS | Status: DC
  Filled 2020-07-05: qty 250

## 2020-07-05 MED ORDER — DEXTROSE 5 % IV SOLN
27.0000 mg/m2 | Freq: Once | INTRAVENOUS | Status: AC
  Administered 2020-07-05: 46 mg via INTRAVENOUS
  Filled 2020-07-05: qty 15

## 2020-07-05 MED ORDER — FAMOTIDINE 20 MG PO TABS
ORAL_TABLET | ORAL | Status: AC
  Filled 2020-07-05: qty 1

## 2020-07-05 MED ORDER — DIPHENHYDRAMINE HCL 25 MG PO CAPS
ORAL_CAPSULE | ORAL | Status: AC
  Filled 2020-07-05: qty 1

## 2020-07-05 MED ORDER — ACETAMINOPHEN 500 MG PO TABS
1000.0000 mg | ORAL_TABLET | Freq: Once | ORAL | Status: AC
  Administered 2020-07-05: 1000 mg via ORAL

## 2020-07-05 MED ORDER — SODIUM CHLORIDE 0.9 % IV SOLN
20.0000 mg | Freq: Once | INTRAVENOUS | Status: AC
  Administered 2020-07-05: 20 mg via INTRAVENOUS
  Filled 2020-07-05: qty 20

## 2020-07-05 MED ORDER — DEXAMETHASONE SODIUM PHOSPHATE 4 MG/ML IJ SOLN
20.0000 mg | Freq: Once | INTRAMUSCULAR | Status: DC
  Filled 2020-07-05: qty 5

## 2020-07-05 MED ORDER — DEXAMETHASONE SODIUM PHOSPHATE 10 MG/ML IJ SOLN
INTRAMUSCULAR | Status: AC
  Filled 2020-07-05: qty 2

## 2020-07-05 MED ORDER — FAMOTIDINE 20 MG PO TABS
20.0000 mg | ORAL_TABLET | Freq: Once | ORAL | Status: AC
  Administered 2020-07-05: 20 mg via ORAL

## 2020-07-05 MED ORDER — ACETAMINOPHEN 500 MG PO TABS
ORAL_TABLET | ORAL | Status: AC
  Filled 2020-07-05: qty 2

## 2020-07-05 MED ORDER — DIPHENHYDRAMINE HCL 25 MG PO CAPS
25.0000 mg | ORAL_CAPSULE | Freq: Once | ORAL | Status: AC
Start: 2020-07-05 — End: 2020-07-05
  Administered 2020-07-05: 25 mg via ORAL

## 2020-07-05 NOTE — Patient Instructions (Signed)
[facility] Discharge Instructions for Patients Receiving Chemotherapy  Today you received the following chemotherapy agents kyprolis  To help prevent nausea and vomiting after your treatment, we encourage you to take your nausea medication as directed.  If you develop nausea and vomiting that is not controlled by your nausea medication, call the clinic.   BELOW ARE SYMPTOMS THAT SHOULD BE REPORTED IMMEDIATELY:  *FEVER GREATER THAN 100.5 F  *CHILLS WITH OR WITHOUT FEVER  NAUSEA AND VOMITING THAT IS NOT CONTROLLED WITH YOUR NAUSEA MEDICATION  *UNUSUAL SHORTNESS OF BREATH  *UNUSUAL BRUISING OR BLEEDING  TENDERNESS IN MOUTH AND THROAT WITH OR WITHOUT PRESENCE OF ULCERS  *URINARY PROBLEMS  *BOWEL PROBLEMS  UNUSUAL RASH Items with * indicate a potential emergency and should be followed up as soon as possible.  Feel free to call the clinic should you have any questions or concerns. The clinic phone number is [phone].  Please show the [place] at check-in to the Emergency Department and triage nurse.

## 2020-07-05 NOTE — Telephone Encounter (Signed)
Added provider appointment to schedule per 3/31 los.

## 2020-07-08 LAB — MULTIPLE MYELOMA PANEL, SERUM
Albumin SerPl Elph-Mcnc: 3.6 g/dL (ref 2.9–4.4)
Albumin/Glob SerPl: 1 (ref 0.7–1.7)
Alpha 1: 0.2 g/dL (ref 0.0–0.4)
Alpha2 Glob SerPl Elph-Mcnc: 0.5 g/dL (ref 0.4–1.0)
B-Globulin SerPl Elph-Mcnc: 2.9 g/dL — ABNORMAL HIGH (ref 0.7–1.3)
Gamma Glob SerPl Elph-Mcnc: 0.4 g/dL (ref 0.4–1.8)
Globulin, Total: 4 g/dL — ABNORMAL HIGH (ref 2.2–3.9)
IgA: 3251 mg/dL — ABNORMAL HIGH (ref 87–352)
IgG (Immunoglobin G), Serum: 191 mg/dL — ABNORMAL LOW (ref 586–1602)
IgM (Immunoglobulin M), Srm: 12 mg/dL — ABNORMAL LOW (ref 26–217)
M Protein SerPl Elph-Mcnc: 2.2 g/dL — ABNORMAL HIGH
Total Protein ELP: 7.6 g/dL (ref 6.0–8.5)

## 2020-07-10 NOTE — Progress Notes (Signed)
HEMATOLOGY/ONCOLOGY CONSULTATION NOTE  Date of Service: 07/11/2020  Patient Care Team: [name], MD as PCP - General (Family Medicine) [name], [name], MD as PCP - Cardiology (Cardiology) [name] [name], MD as Consulting Physician (Gastroenterology)  CHIEF COMPLAINTS/PURPOSE OF CONSULTATION:  Multiple Myeloma  HISTORY OF PRESENTING ILLNESS:   Megan Velasquez is a wonderful 60 y.o. female who has been referred to [place] for evaluation and management of multiple myeloma. Pt is accompanied today by her husband for this visit.   Patient was referred to [place] for second opinion regarding management of newly diagnosed multiple myeloma by Dr. Laurena Slimmer MD.  Patient had been referred to to [facility] cancer center by her primary care physician for anemia and elevated serum protein levels.  She also had some increasing fatigue and tingling in her hands and feet.  Noted primarily in the right wrist and right ankle.  B12 levels were noted to be borderline low.  She had a work-up including labs which showed hemoglobin of 9.7 with an MCV of 86.9 WBC count of 6.95k and platelets of 165k. CMP showed a creatinine of 1.06 calcium level of 8.9 total protein of 10.2 with an albumin of 2.7 normal liver function tests and other electrolytes. Serum protein electrophoresis with an M spike of 4.3 with an additional M spike of 0.2 g/dL.  IgA quantitative more than 6400. B12 levels 276, homocystine 15.9 Serum folate 26.5 LDH 96 Beta-2 microglobulin of 2.5  Serum kappa lambda free light chains showed free kappa light chains of 181, free lambda light chains of 3.6 and a free kappa lambda ratio of 50.3.  24-hour UPEP showed no M spike and a total protein of 5.7 mg/dL.  Bone Survey completed on 03/08/2020 with results revealing "No evidence of lytic nor blastic lesions within the appendicular or axial skeleton. Degenerative changes as described above."   On review of systems, pt reports some mild fatigue.  No  focal bone pains.  No chest pain.  No shortness of breath. No evidence of GI bleeding no hematuria no nosebleeds or gum bleeds. No other acute new focal symptoms. Denies any history of known cardiopulmonary disease strokes or heart attacks.  INTERVAL HISTORY  Megan Velasquez is a wonderful 60 y.o. female who is here today for evaluation and management of multiple myeloma. The patient's last visit with [place] was on 07/04/2020. The pt reports that she is doing well overall. She is here for toxicity check prior to [place].  The pt reports no new symptoms or concerns. The pt notes that she experiences some chest pain on the nights following her chemo treatments. She notes this is not worsened by any other factors and does not move. She notes that it typically resolves on own after some time.  Lab results today 07/11/2020 of CBC w/diff and CMP is as follows: all values are WNL except for CO2 of 21, Glucose of 128, BUN of 26, Calcium of 8.4, Anion gap of 16, Hgb of 10.7, HCT of 33.3, RDW of 20.6.  On review of systems, pt reports chest pain and denies fevers, chills, night sweats, difficulty sleeping, skin rashes,  and any other symptoms.  MEDICAL HISTORY:  Past Medical History:  Diagnosis Date  . Cancer ([place])   . GERD (gastroesophageal reflux disease)   . History of kidney stones   . HTN (hypertension)   . Renal disorder     SURGICAL HISTORY: Past Surgical History:  Procedure Laterality Date  . CHOLECYSTECTOMY    .  COLONOSCOPY  01/2015   Dr. Britta Mccreedy: Mild diverticulosis, sessile polyp ranging 3 to 5 mm removed from the proximal transverse colon, semi-pedunculated polyp 5 to 9 mm in size removed from the sigmoid colon.  Sigmoid colon polyp was serrated adenoma, transverse colon polyp was adenomatous.  Patient was told to have another colonoscopy in 3 years.  . COLONOSCOPY WITH PROPOFOL N/A 10/11/2017   Procedure: COLONOSCOPY WITH PROPOFOL;  Surgeon: Daneil Dolin, MD;  Location: [facility];   Service: Endoscopy;  Laterality: N/A;  1:15pm  . IR IMAGING GUIDED PORT INSERTION  06/05/2020  . POLYPECTOMY  10/11/2017   Procedure: POLYPECTOMY;  Surgeon: Daneil Dolin, MD;  Location: [facility];  Service: Endoscopy;;  descending colon polyps cs times 2    SOCIAL HISTORY: Social History   Socioeconomic History  . Marital status: Married    Spouse name: Not on file  . Number of children: Not on file  . Years of education: Not on file  . Highest education level: Not on file  Occupational History  . Not on file  Tobacco Use  . Smoking status: Former Smoker    Packs/day: 1.00    Years: 28.00    Pack years: 28.00    Types: Cigarettes    Quit date: 10/08/2013    Years since quitting: 6.7  . Smokeless tobacco: Never Used  Vaping Use  . Vaping Use: Never used  Substance and Sexual Activity  . Alcohol use: Not Currently  . Drug use: Never  . Sexual activity: Yes    Birth control/protection: Post-menopausal  Other Topics Concern  . Not on file  Social History Narrative  . Not on file   Social Determinants of Health   Financial Resource Strain: Low Risk   . Difficulty of Paying Living Expenses: Not hard at all  Food Insecurity: No Food Insecurity  . Worried About Charity fundraiser in the Last Year: Never true  . Ran Out of Food in the Last Year: Never true  Transportation Needs: No Transportation Needs  . Lack of Transportation (Medical): No  . Lack of Transportation (Non-Medical): No  Physical Activity: Not on file  Stress: No Stress Concern Present  . Feeling of Stress : Only a little  Social Connections: Unknown  . Frequency of Communication with Friends and Family: More than three times a week  . Frequency of Social Gatherings with Friends and Family: More than three times a week  . Attends Religious Services: Not on file  . Active Member of Clubs or Organizations: Not on file  . Attends Archivist Meetings: Not on file  . Marital Status: Married   Human resources officer Violence: Not on file    FAMILY HISTORY: Family History  Problem Relation Age of Onset  . Heart disease Mother   . Diabetes Mother   . Lung cancer Father   . COPD Father   . Colon cancer Neg Hx     ALLERGIES:  has No Known Allergies.  MEDICATIONS:  Current Outpatient Medications  Medication Sig Dispense Refill  . acetaminophen (TYLENOL) 500 MG tablet Take 1,000 mg by mouth every 6 (six) hours as needed for fever or headache.    Marland Kitchen acyclovir (ZOVIRAX) 400 MG tablet TAKE 1 TABLET BY MOUTH TWICE A DAY 30 tablet 2  . apixaban (ELIQUIS) 2.5 MG TABS tablet Take 1 tablet (2.5 mg total) by mouth 2 (two) times daily. 60 tablet 5  . Ascorbic Acid (VITAMIN C) 1000 MG tablet  Take 1,000 mg by mouth every morning.    Marland Kitchen b complex vitamins capsule Take 1 capsule by mouth daily. (Patient taking differently: Take 1 capsule by mouth every morning.) 30 capsule 3  . Cholecalciferol (VITAMIN D3) 5000 units CAPS Take 5,000 Units by mouth every morning.    . Cyanocobalamin (VITAMIN B12) 3000 MCG SUBL Place 3,000 mcg under the tongue every morning.    Marland Kitchen dexamethasone (DECADRON) 4 MG tablet Take 10 tablets (45m) once on day 22. Repeat every 28 days for the first 4 cycles. Take with breakfast. (Patient taking differently: Take 40 mg by mouth See admin instructions. Take 10 tablets (425m once on day 22 after start of chemo.  Repeat every 28 days for the first 4 cycles. Take with breakfast.) 40 tablet 4  . lenalidomide (REVLIMID) 15 MG capsule Take 1 capsule (15 mg total) by mouth See admin instructions. Take one capsule (15 mg) by mouth daily at bedtime for 21 days. Hold 7 days. Repeat every 28 days. (Patient not taking: Reported on 07/04/2020) 21 capsule 0  . lidocaine-prilocaine (EMLA) cream Apply to affected area once (Patient taking differently: Apply 1 application topically once as needed (prior to port access).) 30 g 3  . loratadine (CLARITIN) 10 MG tablet Take 10 mg by mouth every morning.     . Marland KitchenORazepam (ATIVAN) 0.5 MG tablet Take 1 tablet (0.5 mg total) by mouth every 6 (six) hours as needed (Nausea or vomiting). (Patient not taking: Reported on 07/04/2020) 30 tablet 0  . omeprazole (PRILOSEC) 40 MG capsule Take 40 mg by mouth daily.    . ondansetron (ZOFRAN) 8 MG tablet Take 1 tablet (8 mg total) by mouth 2 (two) times daily as needed (Nausea or vomiting). 30 tablet 1  . prochlorperazine (COMPAZINE) 10 MG tablet Take 1 tablet (10 mg total) by mouth every 6 (six) hours as needed (Nausea or vomiting). 30 tablet 1   No current facility-administered medications for this visit.   Facility-Administered Medications Ordered in Other Visits  Medication Dose Route Frequency Provider Last Rate Last Admin  . carfilzomib (KYPROLIS) 60 mg in dextrose 5 % 100 mL chemo infusion  36 mg/m2 (Treatment Plan Recorded) Intravenous Once KaBrunetta GeneraMD 260 mL/hr at 07/11/20 0938 60 mg at 07/11/20 [id number]. heparin lock flush 100 unit/mL  500 Units Intracatheter Once PRN KaBrunetta GeneraMD      . sodium chloride flush (NS) 0.9 % injection 10 mL  10 mL Intracatheter PRN KaBrunetta GeneraMD        REVIEW OF SYSTEMS:   10 Point review of Systems was done is negative except as noted above.  PHYSICAL EXAMINATION: ECOG PERFORMANCE STATUS: 1 - Symptomatic but completely ambulatory VS reviewed Exam was given in infusion. GENERAL:alert, in no acute distress and comfortable SKIN: no acute rashes, no significant lesions EYES: conjunctiva are pink and non-injected, sclera anicteric OROPHARYNX: MMM, no exudates, no oropharyngeal erythema or ulceration NECK: supple, no JVD LYMPH:  no palpable lymphadenopathy in the cervical, axillary or inguinal regions LUNGS: clear to auscultation b/l with normal respiratory effort HEART: regular rate & rhythm ABDOMEN:  normoactive bowel sounds , non tender, not distended. Extremity: no pedal edema PSYCH: alert & oriented x 3 with fluent speech NEURO:  no focal motor/sensory deficits  LABORATORY DATA:  I have reviewed the data as listed  . CBC Latest Ref Rng & Units 07/11/2020 07/04/2020 06/20/2020  WBC 4.0 - 10.5 K/uL 6.8 6.4 5.0  Hemoglobin 12.0 -  15.0 g/dL 10.7(L) 10.3(L) 10.5(L)  Hematocrit 36.0 - 46.0 % 33.3(L) 32.6(L) 32.7(L)  Platelets 150 - 400 K/uL 154 174 297    . CMP Latest Ref Rng & Units 07/11/2020 07/04/2020 06/20/2020  Glucose 70 - 99 mg/dL 128(H) 92 115(H)  BUN 6 - 20 mg/dL 26(H) 25(H) 23(H)  Creatinine 0.44 - 1.00 mg/dL 0.92 0.94 0.88  Sodium 135 - 145 mmol/L 139 140 136  Potassium 3.5 - 5.1 mmol/L 3.7 4.4 4.2  Chloride 98 - 111 mmol/L 102 105 101  CO2 22 - 32 mmol/L 21(L) 24 24  Calcium 8.9 - 10.3 mg/dL 8.4(L) 8.5(L) 8.6(L)  Total Protein 6.5 - 8.1 g/dL 7.4 7.9 9.1(H)  Total Bilirubin 0.3 - 1.2 mg/dL 1.0 0.5 0.4  Alkaline Phos 38 - 126 U/L 86 92 56  AST 15 - 41 U/L 16 13(L) 15  ALT 0 - 44 U/L 11 12 27      Most recent lab results (03/15/2020) of CBC is as follows: all values are WNL except for RBC at 3.66, Hgb at 9.7, HCT at 31.8, MCHC at 30.5, RDW Standard Deviation 58.7, Red Cell Distribution Width At 18.6,  BUN at 19, Creatinine at 1.06, Total Protein at 10.2, Albumin at 2.7, AST at 14,. 03/13/2020 UPEP shows no M-Spike, all values are WNL 03/06/2020 SPEP shows Total Protein at 10.1, Beta Globulin at 5.2, M-Spike at 4.3, Total Globulin at 6.4, A/G Ratio at 0.6 03/06/2020 Free Kappa Light Chains at 181.1, Free Lambda Light Chains Serum at 3.6, K/L ratio at 50.31 03/06/2020 Beta-2 Microglobulin at 2.5           RADIOGRAPHIC STUDIES: I have personally reviewed the radiological images as listed and agreed with the findings in the report. CT ANGIO CHEST PE W OR WO CONTRAST  Result Date: 06/12/2020 CLINICAL DATA:  Shortness of breath for 3 days EXAM: CT ANGIOGRAPHY CHEST WITH CONTRAST TECHNIQUE: Multidetector CT imaging of the chest was performed using the standard protocol during bolus administration of  intravenous contrast. Multiplanar CT image reconstructions and MIPs were obtained to evaluate the vascular anatomy. CONTRAST:  62m OMNIPAQUE IOHEXOL 350 MG/ML SOLN COMPARISON:  Radiograph June 09, 2020 FINDINGS: Cardiovascular: There is a optimal opacification of the pulmonary arteries. There is no central,segmental, or subsegmental filling defects within the pulmonary arteries. The heart is normal in size. No pericardial effusion or thickening. No evidence right heart strain. There is normal three-vessel brachiocephalic anatomy without proximal stenosis. Scattered aortic atherosclerosis is noted. Mediastinum/Nodes: No hilar, mediastinal, or axillary adenopathy. Thyroid gland, trachea, and esophagus demonstrate no significant findings. Right-sided MediPort catheter seen with the tip at the superior cavoatrial junction. Lungs/Pleura: The lungs are clear. No pleural effusion or pneumothorax. No airspace consolidation. Upper Abdomen: No acute abnormalities present in the visualized portions of the upper abdomen. Diffuse low density seen throughout the right Musculoskeletal: No chest wall abnormality. No acute or significant osseous findings. Review of the MIP images confirms the above findings. IMPRESSION: No central, segmental, or subsegmental pulmonary embolism No acute intrathoracic pathology to explain the patient's symptoms Aortic Atherosclerosis (ICD10-I70.0). Electronically Signed   By: BPrudencio PairM.D.   On: 06/12/2020 12:10    ASSESSMENT & PLAN:   60year old female with  #1 Newly diagnosed multiple myeloma -IgA kappa.  Presenting with anemia hemoglobin today 9.4. Creatinine 1.28 No hypercalcemia Bone survey with no obvious skeletal lesions. Baseline M spike of 4.3 g/dL. Beta-2 Microglobulin at 2.5 Mol Cy Monosomy 13 and dup 1q   PLAN: -Discussed pt  labwork today, 07/11/2020; blood counts stable and chemistries stable, mild anemia. -Advised pt that she has already had a partial response  (50%) after just the first cycle. -Recommended Tums or OTC Maalox for the chest pain/heartburn on nights following chemo treatments. -Will restart Revlimid today going forward. Will take two weeks this cycle and then the full three weeks for next cycle.  -Discussed CDC guidelines related to the COVID vaccines. The pt still does not wish to get this. -Discussed Evusheld and pt's eligibility. Will send referral. -Advised pt to take 650 mg Tylenol once home from treatment days. -Recommended pt stay physically active, drink 48-64 oz water daily, and eat healthy. Make sure to get rest. -Will keep Carfilzomib dose at 36 mg/m^2. -Continue 2.5 mg Eliquis BID. -Will see back in in 3 weeks with C3D1.   FOLLOW UP: Please schedule cycle 3 of KRD as per orders.   Port flush and labs on day 1 8 and 15 each cycle MD visit with cycle 3-day 1 in 3 weeks Ambulatory referral for Evusheld    All of the patients questions were answered with apparent satisfaction. The patient knows to call the clinic with any problems, questions or concerns.     The total time spent in the appointment was 30 minutes and more than 50% was on counseling and direct patient cares.    Sullivan Lone MD [place] AAHIVMS [facility] Hematology/Oncology Physician [facility]  (Office):       [phone] (Work cell):  [phone] (Fax):           [phone]  07/11/2020 9:40 AM  I, Reinaldo Raddle, am acting as scribe for Dr. Sullivan Lone, MD.   .I have reviewed the above documentation for accuracy and completeness, and I agree with the above. Brunetta Genera MD

## 2020-07-11 ENCOUNTER — Other Ambulatory Visit: Payer: Self-pay

## 2020-07-11 ENCOUNTER — Inpatient Hospital Stay: Payer: BC Managed Care – PPO

## 2020-07-11 ENCOUNTER — Inpatient Hospital Stay (HOSPITAL_BASED_OUTPATIENT_CLINIC_OR_DEPARTMENT_OTHER): Payer: BC Managed Care – PPO | Admitting: Hematology

## 2020-07-11 VITALS — BP 129/61 | HR 84 | Temp 98.1°F | Resp 16 | Wt 135.5 lb

## 2020-07-11 DIAGNOSIS — C9000 Multiple myeloma not having achieved remission: Secondary | ICD-10-CM

## 2020-07-11 DIAGNOSIS — Z5111 Encounter for antineoplastic chemotherapy: Secondary | ICD-10-CM | POA: Diagnosis not present

## 2020-07-11 DIAGNOSIS — Z7189 Other specified counseling: Secondary | ICD-10-CM

## 2020-07-11 LAB — CBC WITH DIFFERENTIAL/PLATELET
Abs Immature Granulocytes: 0.03 10*3/uL (ref 0.00–0.07)
Basophils Absolute: 0 10*3/uL (ref 0.0–0.1)
Basophils Relative: 0 %
Eosinophils Absolute: 0.1 10*3/uL (ref 0.0–0.5)
Eosinophils Relative: 2 %
HCT: 33.3 % — ABNORMAL LOW (ref 36.0–46.0)
Hemoglobin: 10.7 g/dL — ABNORMAL LOW (ref 12.0–15.0)
Immature Granulocytes: 0 %
Lymphocytes Relative: 29 %
Lymphs Abs: 2 10*3/uL (ref 0.7–4.0)
MCH: 27.6 pg (ref 26.0–34.0)
MCHC: 32.1 g/dL (ref 30.0–36.0)
MCV: 86 fL (ref 80.0–100.0)
Monocytes Absolute: 1.1 10*3/uL — ABNORMAL HIGH (ref 0.1–1.0)
Monocytes Relative: 17 %
Neutro Abs: 3.5 10*3/uL (ref 1.7–7.7)
Neutrophils Relative %: 52 %
Platelets: 154 10*3/uL (ref 150–400)
RBC: 3.87 MIL/uL (ref 3.87–5.11)
RDW: 20.6 % — ABNORMAL HIGH (ref 11.5–15.5)
WBC: 6.8 10*3/uL (ref 4.0–10.5)
nRBC: 0 % (ref 0.0–0.2)

## 2020-07-11 LAB — CMP (CANCER CENTER ONLY)
ALT: 11 U/L (ref 0–44)
AST: 16 U/L (ref 15–41)
Albumin: 3.6 g/dL (ref 3.5–5.0)
Alkaline Phosphatase: 86 U/L (ref 38–126)
Anion gap: 16 — ABNORMAL HIGH (ref 5–15)
BUN: 26 mg/dL — ABNORMAL HIGH (ref 6–20)
CO2: 21 mmol/L — ABNORMAL LOW (ref 22–32)
Calcium: 8.4 mg/dL — ABNORMAL LOW (ref 8.9–10.3)
Chloride: 102 mmol/L (ref 98–111)
Creatinine: 0.92 mg/dL (ref 0.44–1.00)
GFR, Estimated: >60 mL/min (ref >60)
Glucose, Bld: 128 mg/dL — ABNORMAL HIGH (ref 70–99)
Potassium: 3.7 mmol/L (ref 3.5–5.1)
Sodium: 139 mmol/L (ref 135–145)
Total Bilirubin: 1 mg/dL (ref 0.3–1.2)
Total Protein: 7.4 g/dL (ref 6.5–8.1)

## 2020-07-11 MED ORDER — SODIUM CHLORIDE 0.9 % IV SOLN
Freq: Once | INTRAVENOUS | Status: AC
  Filled 2020-07-11: qty 250

## 2020-07-11 MED ORDER — SODIUM CHLORIDE 0.9 % IV SOLN
20.0000 mg | Freq: Once | INTRAVENOUS | Status: AC
  Administered 2020-07-11: 20 mg via INTRAVENOUS
  Filled 2020-07-11: qty 20

## 2020-07-11 MED ORDER — DIPHENHYDRAMINE HCL 25 MG PO CAPS
ORAL_CAPSULE | ORAL | Status: AC
  Filled 2020-07-11: qty 1

## 2020-07-11 MED ORDER — FAMOTIDINE 20 MG PO TABS
20.0000 mg | ORAL_TABLET | Freq: Once | ORAL | Status: AC
  Administered 2020-07-11: 20 mg via ORAL

## 2020-07-11 MED ORDER — ACETAMINOPHEN 500 MG PO TABS
ORAL_TABLET | ORAL | Status: AC
  Filled 2020-07-11: qty 2

## 2020-07-11 MED ORDER — FAMOTIDINE 20 MG PO TABS
ORAL_TABLET | ORAL | Status: AC
  Filled 2020-07-11: qty 1

## 2020-07-11 MED ORDER — SODIUM CHLORIDE 0.9% FLUSH
10.0000 mL | INTRAVENOUS | Status: DC | PRN
  Administered 2020-07-11: 10 mL
  Filled 2020-07-11: qty 10

## 2020-07-11 MED ORDER — HEPARIN SOD (PORK) LOCK FLUSH 100 UNIT/ML IV SOLN
500.0000 [IU] | Freq: Once | INTRAVENOUS | Status: AC | PRN
  Administered 2020-07-11: 500 [IU]
  Filled 2020-07-11: qty 5

## 2020-07-11 MED ORDER — DIPHENHYDRAMINE HCL 25 MG PO CAPS
25.0000 mg | ORAL_CAPSULE | Freq: Once | ORAL | Status: AC
  Administered 2020-07-11: 25 mg via ORAL

## 2020-07-11 MED ORDER — ACETAMINOPHEN 500 MG PO TABS
1000.0000 mg | ORAL_TABLET | Freq: Once | ORAL | Status: AC
  Administered 2020-07-11: 1000 mg via ORAL

## 2020-07-11 MED ORDER — DEXTROSE 5 % IV SOLN
36.0000 mg/m2 | Freq: Once | INTRAVENOUS | Status: AC
  Administered 2020-07-11: 60 mg via INTRAVENOUS
  Filled 2020-07-11: qty 30

## 2020-07-11 NOTE — Patient Instructions (Signed)
[place] Discharge Instructions for Patients Receiving Chemotheapy  Today you received the following chemotherapy agent: Carfilzomib (Kyprolis)  To help prevent nausea and vomiting after your treatment, we encourage you to take your nausea medication as directed by your MD.   If you develop nausea and vomiting that is not controlled by your nausea medication, call the clinic.   BELOW ARE SYMPTOMS THAT SHOULD BE REPORTED IMMEDIATELY:  *FEVER GREATER THAN 100.5 F  *CHILLS WITH OR WITHOUT FEVER  NAUSEA AND VOMITING THAT IS NOT CONTROLLED WITH YOUR NAUSEA MEDICATION  *UNUSUAL SHORTNESS OF BREATH  *UNUSUAL BRUISING OR BLEEDING  TENDERNESS IN MOUTH AND THROAT WITH OR WITHOUT PRESENCE OF ULCERS  *URINARY PROBLEMS  *BOWEL PROBLEMS  UNUSUAL RASH Items with * indicate a potential emergency and should be followed up as soon as possible.  Feel free to call the clinic should you have any questions or concerns. The clinic phone number is [phone].  Please show the [place] at check-in to the Emergency Department and triage nurse.

## 2020-07-12 ENCOUNTER — Other Ambulatory Visit: Payer: Self-pay | Admitting: Hematology

## 2020-07-12 ENCOUNTER — Inpatient Hospital Stay: Payer: BC Managed Care – PPO

## 2020-07-12 VITALS — BP 151/71 | HR 85 | Temp 98.1°F | Resp 16 | Ht 65.0 in | Wt 139.3 lb

## 2020-07-12 DIAGNOSIS — C9000 Multiple myeloma not having achieved remission: Secondary | ICD-10-CM

## 2020-07-12 DIAGNOSIS — Z7189 Other specified counseling: Secondary | ICD-10-CM

## 2020-07-12 MED ORDER — DIPHENHYDRAMINE HCL 25 MG PO CAPS
ORAL_CAPSULE | ORAL | Status: AC
  Filled 2020-07-12: qty 1

## 2020-07-12 MED ORDER — DIPHENHYDRAMINE HCL 25 MG PO CAPS
25.0000 mg | ORAL_CAPSULE | Freq: Once | ORAL | Status: AC
  Administered 2020-07-12: 25 mg via ORAL

## 2020-07-12 MED ORDER — HEPARIN SOD (PORK) LOCK FLUSH 100 UNIT/ML IV SOLN
500.0000 [IU] | Freq: Once | INTRAVENOUS | Status: AC | PRN
  Administered 2020-07-12: 500 [IU]
  Filled 2020-07-12: qty 5

## 2020-07-12 MED ORDER — FAMOTIDINE 20 MG PO TABS
20.0000 mg | ORAL_TABLET | Freq: Once | ORAL | Status: AC
  Administered 2020-07-12: 20 mg via ORAL

## 2020-07-12 MED ORDER — SODIUM CHLORIDE 0.9 % IV SOLN
20.0000 mg | Freq: Once | INTRAVENOUS | Status: AC
  Administered 2020-07-12: 20 mg via INTRAVENOUS
  Filled 2020-07-12: qty 20

## 2020-07-12 MED ORDER — SODIUM CHLORIDE 0.9 % IV SOLN
Freq: Once | INTRAVENOUS | Status: AC
  Filled 2020-07-12: qty 250

## 2020-07-12 MED ORDER — ACETAMINOPHEN 500 MG PO TABS
ORAL_TABLET | ORAL | Status: AC
  Filled 2020-07-12: qty 2

## 2020-07-12 MED ORDER — DEXTROSE 5 % IV SOLN
36.0000 mg/m2 | Freq: Once | INTRAVENOUS | Status: AC
  Administered 2020-07-12: 60 mg via INTRAVENOUS
  Filled 2020-07-12: qty 30

## 2020-07-12 MED ORDER — ACETAMINOPHEN 500 MG PO TABS
1000.0000 mg | ORAL_TABLET | Freq: Once | ORAL | Status: AC
  Administered 2020-07-12: 1000 mg via ORAL

## 2020-07-12 MED ORDER — SODIUM CHLORIDE 0.9% FLUSH
10.0000 mL | INTRAVENOUS | Status: DC | PRN
  Administered 2020-07-12: 10 mL
  Filled 2020-07-12: qty 10

## 2020-07-12 MED ORDER — SODIUM CHLORIDE 0.9 % IV SOLN
Freq: Once | INTRAVENOUS | Status: AC
Start: 2020-07-12 — End: 2020-07-12
  Filled 2020-07-12: qty 250

## 2020-07-12 MED ORDER — FAMOTIDINE 20 MG PO TABS
ORAL_TABLET | ORAL | Status: AC
  Filled 2020-07-12: qty 1

## 2020-07-12 NOTE — Patient Instructions (Signed)
[place] Discharge Instructions for Patients Receiving Chemotherapy  Today you received the following chemotherapy agents: Carfilzomib (Kyprolis)  To help prevent nausea and vomiting after your treatment, we encourage you to take your nausea medication  as prescribed.   If you develop nausea and vomiting that is not controlled by your nausea medication, call the clinic.   BELOW ARE SYMPTOMS THAT SHOULD BE REPORTED IMMEDIATELY:  *FEVER GREATER THAN 100.5 F  *CHILLS WITH OR WITHOUT FEVER  NAUSEA AND VOMITING THAT IS NOT CONTROLLED WITH YOUR NAUSEA MEDICATION  *UNUSUAL SHORTNESS OF BREATH  *UNUSUAL BRUISING OR BLEEDING  TENDERNESS IN MOUTH AND THROAT WITH OR WITHOUT PRESENCE OF ULCERS  *URINARY PROBLEMS  *BOWEL PROBLEMS  UNUSUAL RASH Items with * indicate a potential emergency and should be followed up as soon as possible.  Feel free to call the clinic should you have any questions or concerns. The clinic phone number is [phone].  Please show the [place] at check-in to the Emergency Department and triage nurse.

## 2020-07-14 ENCOUNTER — Telehealth: Payer: Self-pay | Admitting: Physician Assistant

## 2020-07-14 NOTE — Telephone Encounter (Signed)
Called pt about Evusheld (tixagevimab co-packaged with cilgavimab) for pre-exposure prophylaxis for prevention of coronavirus disease 2019 (COVID-19) caused by the SARS-CoV-2 virus. The patient is a candidate for this therapy given increased risk for severe disease caused by immunosuppression.   Pt would like to think about it and call us back.   Angelena Form PA-C  [facility]

## 2020-07-14 NOTE — Telephone Encounter (Signed)
Called pt about Evusheld (tixagevimab co-packaged with cilgavimab) for pre-exposure prophylaxis for prevention of coronavirus disease 2019 (COVID-19) caused by the SARS-CoV-2 virus. The patient is a candidate for this therapy given increased risk for severe disease caused by immunosuppression.    Unable to reach pt- left message to call back   Angelena Form PA-C  [facility]

## 2020-07-17 ENCOUNTER — Telehealth: Payer: Self-pay | Admitting: Hematology

## 2020-07-17 NOTE — Telephone Encounter (Signed)
Left message with follow-up appointments per 4/7 los. Gave option to call back to reschedule if needed.

## 2020-07-18 ENCOUNTER — Ambulatory Visit: Payer: BC Managed Care – PPO

## 2020-07-18 ENCOUNTER — Inpatient Hospital Stay: Payer: BC Managed Care – PPO

## 2020-07-18 ENCOUNTER — Other Ambulatory Visit: Payer: BC Managed Care – PPO

## 2020-07-18 ENCOUNTER — Other Ambulatory Visit: Payer: Self-pay

## 2020-07-18 VITALS — BP 137/67 | HR 79 | Temp 98.4°F | Resp 18 | Ht 65.0 in | Wt 138.3 lb

## 2020-07-18 DIAGNOSIS — C9000 Multiple myeloma not having achieved remission: Secondary | ICD-10-CM

## 2020-07-18 DIAGNOSIS — Z7189 Other specified counseling: Secondary | ICD-10-CM

## 2020-07-18 LAB — CBC WITH DIFFERENTIAL/PLATELET
Abs Immature Granulocytes: 0.09 10*3/uL — ABNORMAL HIGH (ref 0.00–0.07)
Basophils Absolute: 0 10*3/uL (ref 0.0–0.1)
Basophils Relative: 0 %
Eosinophils Absolute: 0.3 10*3/uL (ref 0.0–0.5)
Eosinophils Relative: 4 %
HCT: 32.3 % — ABNORMAL LOW (ref 36.0–46.0)
Hemoglobin: 10.4 g/dL — ABNORMAL LOW (ref 12.0–15.0)
Immature Granulocytes: 2 %
Lymphocytes Relative: 25 %
Lymphs Abs: 1.5 10*3/uL (ref 0.7–4.0)
MCH: 28 pg (ref 26.0–34.0)
MCHC: 32.2 g/dL (ref 30.0–36.0)
MCV: 87.1 fL (ref 80.0–100.0)
Monocytes Absolute: 0.6 10*3/uL (ref 0.1–1.0)
Monocytes Relative: 10 %
Neutro Abs: 3.6 10*3/uL (ref 1.7–7.7)
Neutrophils Relative %: 59 %
Platelets: 160 10*3/uL (ref 150–400)
RBC: 3.71 MIL/uL — ABNORMAL LOW (ref 3.87–5.11)
RDW: 21.4 % — ABNORMAL HIGH (ref 11.5–15.5)
WBC: 6 10*3/uL (ref 4.0–10.5)
nRBC: 0 % (ref 0.0–0.2)

## 2020-07-18 LAB — CMP (CANCER CENTER ONLY)
ALT: 13 U/L (ref 0–44)
AST: 14 U/L — ABNORMAL LOW (ref 15–41)
Albumin: 3.7 g/dL (ref 3.5–5.0)
Alkaline Phosphatase: 74 U/L (ref 38–126)
Anion gap: 15 (ref 5–15)
BUN: 24 mg/dL — ABNORMAL HIGH (ref 6–20)
CO2: 22 mmol/L (ref 22–32)
Calcium: 8.5 mg/dL — ABNORMAL LOW (ref 8.9–10.3)
Chloride: 102 mmol/L (ref 98–111)
Creatinine: 0.84 mg/dL (ref 0.44–1.00)
GFR, Estimated: >60 mL/min (ref >60)
Glucose, Bld: 127 mg/dL — ABNORMAL HIGH (ref 70–99)
Potassium: 4 mmol/L (ref 3.5–5.1)
Sodium: 139 mmol/L (ref 135–145)
Total Bilirubin: 0.8 mg/dL (ref 0.3–1.2)
Total Protein: 6.8 g/dL (ref 6.5–8.1)

## 2020-07-18 MED ORDER — SODIUM CHLORIDE 0.9 % IV SOLN
Freq: Once | INTRAVENOUS | Status: AC
  Filled 2020-07-18: qty 250

## 2020-07-18 MED ORDER — SODIUM CHLORIDE 0.9 % IV SOLN
20.0000 mg | Freq: Once | INTRAVENOUS | Status: AC
  Administered 2020-07-18: 20 mg via INTRAVENOUS
  Filled 2020-07-18: qty 20

## 2020-07-18 MED ORDER — DEXTROSE 5 % IV SOLN
36.0000 mg/m2 | Freq: Once | INTRAVENOUS | Status: AC
  Administered 2020-07-18: 60 mg via INTRAVENOUS
  Filled 2020-07-18: qty 30

## 2020-07-18 MED ORDER — FAMOTIDINE IN NACL 20-0.9 MG/50ML-% IV SOLN
INTRAVENOUS | Status: AC
  Filled 2020-07-18: qty 50

## 2020-07-18 MED ORDER — DIPHENHYDRAMINE HCL 25 MG PO CAPS
25.0000 mg | ORAL_CAPSULE | Freq: Once | ORAL | Status: AC
  Administered 2020-07-18: 25 mg via ORAL

## 2020-07-18 MED ORDER — SODIUM CHLORIDE 0.9% FLUSH
10.0000 mL | INTRAVENOUS | Status: DC | PRN
  Administered 2020-07-18: 10 mL
  Filled 2020-07-18: qty 10

## 2020-07-18 MED ORDER — FAMOTIDINE 20 MG PO TABS
20.0000 mg | ORAL_TABLET | Freq: Once | ORAL | Status: AC
  Administered 2020-07-18: 20 mg via ORAL

## 2020-07-18 MED ORDER — HEPARIN SOD (PORK) LOCK FLUSH 100 UNIT/ML IV SOLN
500.0000 [IU] | Freq: Once | INTRAVENOUS | Status: AC | PRN
Start: 2020-07-18 — End: 2020-07-18
  Administered 2020-07-18: 500 [IU]
  Filled 2020-07-18: qty 5

## 2020-07-18 MED ORDER — ACETAMINOPHEN 500 MG PO TABS
1000.0000 mg | ORAL_TABLET | Freq: Once | ORAL | Status: AC
  Administered 2020-07-18: 1000 mg via ORAL

## 2020-07-18 MED ORDER — ACETAMINOPHEN 500 MG PO TABS
ORAL_TABLET | ORAL | Status: AC
  Filled 2020-07-18: qty 2

## 2020-07-18 MED ORDER — FAMOTIDINE 20 MG PO TABS
ORAL_TABLET | ORAL | Status: AC
  Filled 2020-07-18: qty 1

## 2020-07-18 MED ORDER — DIPHENHYDRAMINE HCL 25 MG PO CAPS
ORAL_CAPSULE | ORAL | Status: AC
  Filled 2020-07-18: qty 1

## 2020-07-18 NOTE — Patient Instructions (Signed)
[place] Discharge Instructions for Patients Receiving Chemotherapy  Today you received the following chemotherapy agents: Carfilzomib (Kyprolis)   To help prevent nausea and vomiting after your treatment, we encourage you to take your nausea medication  as prescribed.    If you develop nausea and vomiting that is not controlled by your nausea medication, call the clinic.   BELOW ARE SYMPTOMS THAT SHOULD BE REPORTED IMMEDIATELY:  *FEVER GREATER THAN 100.5 F  *CHILLS WITH OR WITHOUT FEVER  NAUSEA AND VOMITING THAT IS NOT CONTROLLED WITH YOUR NAUSEA MEDICATION  *UNUSUAL SHORTNESS OF BREATH  *UNUSUAL BRUISING OR BLEEDING  TENDERNESS IN MOUTH AND THROAT WITH OR WITHOUT PRESENCE OF ULCERS  *URINARY PROBLEMS  *BOWEL PROBLEMS  UNUSUAL RASH Items with * indicate a potential emergency and should be followed up as soon as possible.  Feel free to call the clinic should you have any questions or concerns. The clinic phone number is [phone].  Please show the [place] at check-in to the Emergency Department and triage nurse.

## 2020-07-19 ENCOUNTER — Telehealth: Payer: Self-pay

## 2020-07-19 ENCOUNTER — Inpatient Hospital Stay: Payer: BC Managed Care – PPO

## 2020-07-19 VITALS — BP 168/70 | HR 93 | Temp 97.7°F | Resp 18

## 2020-07-19 DIAGNOSIS — Z7189 Other specified counseling: Secondary | ICD-10-CM

## 2020-07-19 DIAGNOSIS — C9000 Multiple myeloma not having achieved remission: Secondary | ICD-10-CM

## 2020-07-19 MED ORDER — HEPARIN SOD (PORK) LOCK FLUSH 100 UNIT/ML IV SOLN
500.0000 [IU] | Freq: Once | INTRAVENOUS | Status: AC | PRN
  Administered 2020-07-19: 500 [IU]
  Filled 2020-07-19: qty 5

## 2020-07-19 MED ORDER — DIPHENHYDRAMINE HCL 25 MG PO CAPS
ORAL_CAPSULE | ORAL | Status: AC
  Filled 2020-07-19: qty 1

## 2020-07-19 MED ORDER — DEXTROSE 5 % IV SOLN
36.0000 mg/m2 | Freq: Once | INTRAVENOUS | Status: AC
  Administered 2020-07-19: 60 mg via INTRAVENOUS
  Filled 2020-07-19: qty 30

## 2020-07-19 MED ORDER — ACETAMINOPHEN 500 MG PO TABS
1000.0000 mg | ORAL_TABLET | Freq: Once | ORAL | Status: AC
  Administered 2020-07-19: 1000 mg via ORAL

## 2020-07-19 MED ORDER — SODIUM CHLORIDE 0.9 % IV SOLN
Freq: Once | INTRAVENOUS | Status: AC
  Filled 2020-07-19: qty 250

## 2020-07-19 MED ORDER — SODIUM CHLORIDE 0.9 % IV SOLN
20.0000 mg | Freq: Once | INTRAVENOUS | Status: AC
  Administered 2020-07-19: 20 mg via INTRAVENOUS
  Filled 2020-07-19: qty 20

## 2020-07-19 MED ORDER — FAMOTIDINE 20 MG PO TABS
ORAL_TABLET | ORAL | Status: AC
  Filled 2020-07-19: qty 1

## 2020-07-19 MED ORDER — DIPHENHYDRAMINE HCL 25 MG PO CAPS
25.0000 mg | ORAL_CAPSULE | Freq: Once | ORAL | Status: AC
  Administered 2020-07-19: 25 mg via ORAL

## 2020-07-19 MED ORDER — SODIUM CHLORIDE 0.9 % IV SOLN
Freq: Once | INTRAVENOUS | Status: DC
  Filled 2020-07-19: qty 250

## 2020-07-19 MED ORDER — SODIUM CHLORIDE 0.9% FLUSH
10.0000 mL | INTRAVENOUS | Status: DC | PRN
  Administered 2020-07-19: 10 mL
  Filled 2020-07-19: qty 10

## 2020-07-19 MED ORDER — FAMOTIDINE 20 MG PO TABS
20.0000 mg | ORAL_TABLET | Freq: Once | ORAL | Status: AC
  Administered 2020-07-19: 20 mg via ORAL

## 2020-07-19 MED ORDER — ACETAMINOPHEN 500 MG PO TABS
ORAL_TABLET | ORAL | Status: AC
  Filled 2020-07-19: qty 2

## 2020-07-19 NOTE — Patient Instructions (Signed)
[place] Discharge Instructions for Patients Receiving Chemotherapy  Today you received the following chemotherapy agents: Carfilzomib (Kyprolis)   To help prevent nausea and vomiting after your treatment, we encourage you to take your nausea medication  as prescribed.    If you develop nausea and vomiting that is not controlled by your nausea medication, call the clinic.   BELOW ARE SYMPTOMS THAT SHOULD BE REPORTED IMMEDIATELY:  *FEVER GREATER THAN 100.5 F  *CHILLS WITH OR WITHOUT FEVER  NAUSEA AND VOMITING THAT IS NOT CONTROLLED WITH YOUR NAUSEA MEDICATION  *UNUSUAL SHORTNESS OF BREATH  *UNUSUAL BRUISING OR BLEEDING  TENDERNESS IN MOUTH AND THROAT WITH OR WITHOUT PRESENCE OF ULCERS  *URINARY PROBLEMS  *BOWEL PROBLEMS  UNUSUAL RASH Items with * indicate a potential emergency and should be followed up as soon as possible.  Feel free to call the clinic should you have any questions or concerns. The clinic phone number is [phone].  Please show the [place] at check-in to the Emergency Department and triage nurse.

## 2020-07-19 NOTE — Progress Notes (Signed)
Update on Revlimid per Dr Grier Mitts note pt was to take 14 days on for this cycle and then 7 days off. Next cycle will go back to 21 days on and 7 off. Pt contacted and informed. Pt acknowledged and verbalized understanding.

## 2020-07-19 NOTE — Telephone Encounter (Signed)
Returned call to pt's husband. Husband had a question about Revlimid. Pt had stopped med per Dr Grier Mitts direction while in the hospital. Med had been restated but they wanted to confirm how to take it.  Instructed to treat this as a new 28 day cycle. Pt's husband verbalized understanding.

## 2020-07-24 ENCOUNTER — Other Ambulatory Visit: Payer: Self-pay | Admitting: Hematology

## 2020-07-24 ENCOUNTER — Other Ambulatory Visit: Payer: Self-pay

## 2020-07-24 DIAGNOSIS — C9000 Multiple myeloma not having achieved remission: Secondary | ICD-10-CM

## 2020-07-24 DIAGNOSIS — Z7189 Other specified counseling: Secondary | ICD-10-CM

## 2020-07-30 ENCOUNTER — Telehealth: Payer: Self-pay | Admitting: *Deleted

## 2020-07-30 NOTE — Telephone Encounter (Signed)
Pt reports she is having "thick, sticky drainage since the start of allergy season. Temp this morning is 99.4"  Take allergy medication every morning. (Loratidine) And is using nasal rinse.  Wants to know what else she can use.

## 2020-07-31 ENCOUNTER — Telehealth: Payer: Self-pay | Admitting: *Deleted

## 2020-07-31 NOTE — Telephone Encounter (Signed)
Contacted patient with Dr. Grier Mitts response to question about thick sticky drainage since beginning of allergy season.LVM with following info: steam inhalation with distilled water and humidifier in the room overnight when sleeping  Advised patient that office will call again on 4/28 to confirm understanding of information

## 2020-07-31 NOTE — Progress Notes (Signed)
HEMATOLOGY/ONCOLOGY CONSULTATION NOTE  Date of Service: 08/01/2020  Patient Care Team: [name], MD as PCP - General (Family Medicine) [name], [name], MD as PCP - Cardiology (Cardiology) [name] [name], MD as Consulting Physician (Gastroenterology)  CHIEF COMPLAINTS/PURPOSE OF CONSULTATION:  Multiple Myeloma  HISTORY OF PRESENTING ILLNESS:   [facility]egan [facility] is a wonderful 60 y.o. female who has been referred to [place] for evaluation and management of multiple myeloma. Pt is accompanied today by her husband for this visit.    Patient was referred to [place] for second opinion regarding management of newly diagnosed multiple myeloma by Dr. Laurena Slimmer MD.  Patient had been referred to to [facility] by her primary care physician for anemia and elevated serum protein levels.  She also had some increasing fatigue and tingling in her hands and feet.  Noted primarily in the right wrist and right ankle.  B12 levels were noted to be borderline low.  She had a work-up including labs which showed hemoglobin of 9.7 with an MCV of 86.9 WBC count of 6.95k and platelets of 165k. CMP showed a creatinine of 1.06 calcium level of 8.9 total protein of 10.2 with an albumin of 2.7 normal liver function tests and other electrolytes. Serum protein electrophoresis with an M spike of 4.3 with an additional M spike of 0.2 g/dL.  IgA quantitative more than 6400. B12 levels 276, homocystine 15.9 Serum folate 26.5 LDH 96 Beta-2 microglobulin of 2.5  Serum kappa lambda free light chains showed free kappa light chains of 181, free lambda light chains of 3.6 and a free kappa lambda ratio of 50.3.  24-hour UPEP showed no M spike and a total protein of 5.7 mg/dL.  Bone Survey completed on 03/08/2020 with results revealing "No evidence of lytic nor blastic lesions within the appendicular or axial skeleton. Degenerative changes as described above."   On review of systems, pt reports some mild fatigue.  No  focal bone pains.  No chest pain.  No shortness of breath. No evidence of GI bleeding no hematuria no nosebleeds or gum bleeds. No other acute new focal symptoms. Denies any history of known cardiopulmonary disease strokes or heart attacks.  INTERVAL HISTORY  Megan [facility]asquez is a wonderful 60 y.o. female who is here today for evaluation and management of multiple myeloma. The patient's last visit with [place] was on 07/04/2020. The pt reports that she is doing well overall. She is here for toxicity check prior to [place].  The pt reports that se has been experiencing some seasonal allergies and related symptoms of sinuses and post nasal drip and sinus pain for a few months. The pt notes that has been controlled by sinus drains prior, but recently has been having drainage of yellow color to her throat causing throat soreness. The pt notes that she has also had intermittent fevers, never above 100.5 and all alleviated with Tylenol. The pt notes no known allergies to any antibiotics. The pt notes that yesterday she had some redness around her Port in the area of the incision. The pt notes that she feels this time it has a small hole after washing it off.  Lab results today 08/01/2020 of CBC w/diff and CMP is as follows: all values are WNL except for WBC of 10.6K, RDW of 18.7, Glucose of 113, AST of 14.  On review of systems, pt reports allergies, post nasal drip, intermittent throat soreness, sinus pain and denies acute skin rashes, high fevers, new bone  pains, chills, SOB, worsening fatigue, decreased appetite, and any other symptoms.  MEDICAL HISTORY:  Past Medical History:  Diagnosis Date  . Cancer ([place])   . GERD (gastroesophageal reflux disease)   . History of kidney stones   . HTN (hypertension)   . Renal disorder     SURGICAL HISTORY: Past Surgical History:  Procedure Laterality Date  . CHOLECYSTECTOMY    . COLONOSCOPY  01/2015   Dr. Britta Mccreedy: Mild diverticulosis, sessile polyp ranging 3 to 5  mm removed from the proximal transverse colon, semi-pedunculated polyp 5 to 9 mm in size removed from the sigmoid colon.  Sigmoid colon polyp was serrated adenoma, transverse colon polyp was adenomatous.  Patient was told to have another colonoscopy in 3 years.  . COLONOSCOPY WITH PROPOFOL N/A 10/11/2017   Procedure: COLONOSCOPY WITH PROPOFOL;  Surgeon: Daneil Dolin, MD;  Location: [facility];  Service: Endoscopy;  Laterality: N/A;  1:15pm  . IR IMAGING GUIDED PORT INSERTION  06/05/2020  . POLYPECTOMY  10/11/2017   Procedure: POLYPECTOMY;  Surgeon: Daneil Dolin, MD;  Location: [facility];  Service: Endoscopy;;  descending colon polyps cs times 2    SOCIAL HISTORY: Social History   Socioeconomic History  . Marital status: Married    Spouse name: Not on file  . Number of children: Not on file  . Years of education: Not on file  . Highest education level: Not on file  Occupational History  . Not on file  Tobacco Use  . Smoking status: Former Smoker    Packs/day: 1.00    Years: 28.00    Pack years: 28.00    Types: Cigarettes    Quit date: 10/08/2013    Years since quitting: 6.8  . Smokeless tobacco: Never Used  Vaping Use  . Vaping Use: Never used  Substance and Sexual Activity  . Alcohol use: Not Currently  . Drug use: Never  . Sexual activity: Yes    Birth control/protection: Post-menopausal  Other Topics Concern  . Not on file  Social History Narrative  . Not on file   Social Determinants of Health   Financial Resource Strain: Low Risk   . Difficulty of Paying Living Expenses: Not hard at all  Food Insecurity: No Food Insecurity  . Worried About Charity fundraiser in the Last Year: Never true  . Ran Out of Food in the Last Year: Never true  Transportation Needs: No Transportation Needs  . Lack of Transportation (Medical): No  . Lack of Transportation (Non-Medical): No  Physical Activity: Not on file  Stress: No Stress Concern Present  . Feeling of Stress : Only  a little  Social Connections: Unknown  . Frequency of Communication with Friends and Family: More than three times a week  . Frequency of Social Gatherings with Friends and Family: More than three times a week  . Attends Religious Services: Not on file  . Active Member of Clubs or Organizations: Not on file  . Attends Archivist Meetings: Not on file  . Marital Status: Married  Human resources officer Violence: Not on file    FAMILY HISTORY: Family History  Problem Relation Age of Onset  . Heart disease Mother   . Diabetes Mother   . Lung cancer Father   . COPD Father   . Colon cancer Neg Hx     ALLERGIES:  has No Known Allergies.  MEDICATIONS:  Current Outpatient Medications  Medication Sig Dispense Refill  . amoxicillin-clavulanate (AUGMENTIN) 875-125  MG tablet Take 1 tablet by mouth 2 (two) times daily for 10 days. 20 tablet 0  . acetaminophen (TYLENOL) 500 MG tablet Take 1,000 mg by mouth every 6 (six) hours as needed for fever or headache.    Marland Kitchen acyclovir (ZOVIRAX) 400 MG tablet TAKE 1 TABLET BY MOUTH TWICE A DAY 30 tablet 2  . apixaban (ELIQUIS) 2.5 MG TABS tablet Take 1 tablet (2.5 mg total) by mouth 2 (two) times daily. 60 tablet 5  . Ascorbic Acid (VITAMIN C) 1000 MG tablet Take 1,000 mg by mouth every morning.    Marland Kitchen b complex vitamins capsule Take 1 capsule by mouth daily. (Patient taking differently: Take 1 capsule by mouth every morning.) 30 capsule 3  . Cholecalciferol (VITAMIN D3) 5000 units CAPS Take 5,000 Units by mouth every morning.    . Cyanocobalamin (VITAMIN B12) 3000 MCG SUBL Place 3,000 mcg under the tongue every morning.    Marland Kitchen dexamethasone (DECADRON) 4 MG tablet Take 10 tablets (53m) once on day 22. Repeat every 28 days for the first 4 cycles. Take with breakfast. (Patient taking differently: Take 40 mg by mouth See admin instructions. Take 10 tablets (479m once on day 22 after start of chemo.  Repeat every 28 days for the first 4 cycles. Take with  breakfast.) 40 tablet 4  . lidocaine-prilocaine (EMLA) cream Apply to affected area once (Patient taking differently: Apply 1 application topically once as needed (prior to port access).) 30 g 3  . loratadine (CLARITIN) 10 MG tablet Take 10 mg by mouth every morning.    . Marland KitchenORazepam (ATIVAN) 0.5 MG tablet Take 1 tablet (0.5 mg total) by mouth every 6 (six) hours as needed (Nausea or vomiting). (Patient not taking: Reported on 07/04/2020) 30 tablet 0  . omeprazole (PRILOSEC) 40 MG capsule Take 40 mg by mouth daily.    . ondansetron (ZOFRAN) 8 MG tablet Take 1 tablet (8 mg total) by mouth 2 (two) times daily as needed (Nausea or vomiting). 30 tablet 1  . prochlorperazine (COMPAZINE) 10 MG tablet Take 1 tablet (10 mg total) by mouth every 6 (six) hours as needed (Nausea or vomiting). 30 tablet 1  . REVLIMID 15 MG capsule TAKE 1 CAPSULE BY MOUTH ONCE DAILY AT BEDTIME FOR 21 DAYS ON AND 7 DAYS OFF. REPEAT EVERY 28 DAYS. 21 capsule 0   No current facility-administered medications for this visit.   Facility-Administered Medications Ordered in Other Visits  Medication Dose Route Frequency Provider Last Rate Last Admin  . sodium chloride flush (NS) 0.9 % injection 10 mL  10 mL Intracatheter PRN KaBrunetta GeneraMD   10 mL at 08/01/20 1539    REVIEW OF SYSTEMS:   10 Point review of Systems was done is negative except as noted above.  PHYSICAL EXAMINATION: ECOG PERFORMANCE STATUS: 1 - Symptomatic but completely ambulatory  Exam was given in infusion.   GENERAL:alert, in no acute distress and comfortable SKIN: no significant lesions. Stitch granuloma around Port incision site. EYES: conjunctiva are pink and non-injected, sclera anicteric OROPHARYNX: MMM, no exudates, no oropharyngeal erythema or ulceration NECK: supple, no JVD LYMPH:  no palpable lymphadenopathy in the cervical, axillary or inguinal regions LUNGS: clear to auscultation b/l with normal respiratory effort HEART: regular rate &  rhythm ABDOMEN:  normoactive bowel sounds , non tender, not distended. Extremity: no pedal edema PSYCH: alert & oriented x 3 with fluent speech NEURO: no focal motor/sensory deficits   LABORATORY DATA:  I have reviewed the data  as listed  . CBC Latest Ref Rng & Units 08/01/2020 07/18/2020 07/11/2020  WBC 4.0 - 10.5 K/uL 10.6(H) 6.0 6.8  Hemoglobin 12.0 - 15.0 g/dL 12.1 10.4(L) 10.7(L)  Hematocrit 36.0 - 46.0 % 37.4 32.3(L) 33.3(L)  Platelets 150 - 400 K/uL 272 160 154    . CMP Latest Ref Rng & Units 08/01/2020 07/18/2020 07/11/2020  Glucose 70 - 99 mg/dL 113(H) 127(H) 128(H)  BUN 6 - 20 mg/dL 20 24(H) 26(H)  Creatinine 0.44 - 1.00 mg/dL 0.78 0.84 0.92  Sodium 135 - 145 mmol/L 139 139 139  Potassium 3.5 - 5.1 mmol/L 4.1 4.0 3.7  Chloride 98 - 111 mmol/L 102 102 102  CO2 22 - 32 mmol/L 25 22 21(L)  Calcium 8.9 - 10.3 mg/dL 9.3 8.5(L) 8.4(L)  Total Protein 6.5 - 8.1 g/dL 7.1 6.8 7.4  Total Bilirubin 0.3 - 1.2 mg/dL 0.5 0.8 1.0  Alkaline Phos 38 - 126 U/L 76 74 86  AST 15 - 41 U/L 14(L) 14(L) 16  ALT 0 - 44 U/L 12 13 11      Most recent lab results (03/15/2020) of CBC is as follows: all values are WNL except for RBC at 3.66, Hgb at 9.7, HCT at 31.8, MCHC at 30.5, RDW Standard Deviation 58.7, Red Cell Distribution Width At 18.6,  BUN at 19, Creatinine at 1.06, Total Protein at 10.2, Albumin at 2.7, AST at 14,. 03/13/2020 UPEP shows no M-Spike, all values are WNL 03/06/2020 SPEP shows Total Protein at 10.1, Beta Globulin at 5.2, M-Spike at 4.3, Total Globulin at 6.4, A/G Ratio at 0.6 03/06/2020 Free Kappa Light Chains at 181.1, Free Lambda Light Chains Serum at 3.6, K/L ratio at 50.31 03/06/2020 Beta-2 Microglobulin at 2.5           RADIOGRAPHIC STUDIES: I have personally reviewed the radiological images as listed and agreed with the findings in the report. No results found.  ASSESSMENT & PLAN:   60 year old female with  #1 Newly diagnosed multiple myeloma -IgA kappa.   Presenting with anemia hemoglobin today 9.4. Creatinine 1.28 No hypercalcemia Bone survey with no obvious skeletal lesions. Baseline M spike of 4.3 g/dL. Beta-2 Microglobulin at 2.5 Mol Cy Monosomy 13 and dup 1q   PLAN: -Discussed pt labwork today, 08/01/2020; blood counts and chemistries stable. -Advised pt her last MMP from 07/04/2020 showed an improved m-protein from 3.0 to 2.2. -Advised pt the red spot around [place] is most likely a formed stitch granuloma. Recommended OTC antibiotic ointment BID as a barrier. Soft gauze with light tape to ensure nothing is rubbing against and bothering this spot. -Advised pt we will start on a short course of antibiotics in case of acute infection due to sinus drainage and yellow color. Will Rx Augmentin. -Recommended OTC Claritin or Zyrtec. The pt currently takes Claritin daily. -Recommended steam inhaler many times daily. -Advised pt that if she tolerates this cycle of 15 mg Revlimid, we will finally adjust to the 25 mg full dosage next cycle. -Advised pt to continue to take 650 mg Tylenol once home from treatment days. -Recommended pt stay physically active, drink 48-64 oz water daily, and eat healthy. Make sure to get rest. -Will keep Carfilzomib dose at 36 mg/m^2. This is the full dosage and the pt is tolerating this well. -Continue 2.5 mg Eliquis BID. -Will send out MMP and Light Chain labs today to observe response following two full cycles.    FOLLOW UP: plz schedule C4 of treatment as per orders. portflush and Labs  on D1,D8 and D15 of treatment Next MD visit with C4D1 of treatment    All of the patients questions were answered with apparent satisfaction. The patient knows to call the clinic with any problems, questions or concerns.     The total time spent in the appointment was 30 minutes and more than 50% was on counseling and direct patient cares, orderng and managementof chemotherapy.    Sullivan Lone MD [place] AAHIVMS [facility]  [facility] Hematology/Oncology Physician [facility]  (Office):       [phone] (Work cell):  [phone] (Fax):           [phone]  08/01/2020 3:47 PM  I, Reinaldo Raddle, am acting as scribe for Dr. Sullivan Lone, MD.   .I have reviewed the above documentation for accuracy and completeness, and I agree with the above. Brunetta Genera MD

## 2020-08-01 ENCOUNTER — Inpatient Hospital Stay (HOSPITAL_BASED_OUTPATIENT_CLINIC_OR_DEPARTMENT_OTHER): Payer: BC Managed Care – PPO | Admitting: Hematology

## 2020-08-01 ENCOUNTER — Inpatient Hospital Stay: Payer: BC Managed Care – PPO

## 2020-08-01 ENCOUNTER — Other Ambulatory Visit: Payer: BC Managed Care – PPO

## 2020-08-01 ENCOUNTER — Other Ambulatory Visit: Payer: Self-pay

## 2020-08-01 VITALS — BP 117/77 | HR 92 | Temp 98.4°F | Resp 18 | Wt 134.5 lb

## 2020-08-01 DIAGNOSIS — C9000 Multiple myeloma not having achieved remission: Secondary | ICD-10-CM

## 2020-08-01 DIAGNOSIS — Z95828 Presence of other vascular implants and grafts: Secondary | ICD-10-CM

## 2020-08-01 DIAGNOSIS — Z7189 Other specified counseling: Secondary | ICD-10-CM

## 2020-08-01 LAB — CBC WITH DIFFERENTIAL/PLATELET
Abs Immature Granulocytes: 0.03 10*3/uL (ref 0.00–0.07)
Basophils Absolute: 0.1 10*3/uL (ref 0.0–0.1)
Basophils Relative: 1 %
Eosinophils Absolute: 0.1 10*3/uL (ref 0.0–0.5)
Eosinophils Relative: 1 %
HCT: 37.4 % (ref 36.0–46.0)
Hemoglobin: 12.1 g/dL (ref 12.0–15.0)
Immature Granulocytes: 0 %
Lymphocytes Relative: 24 %
Lymphs Abs: 2.6 10*3/uL (ref 0.7–4.0)
MCH: 29.4 pg (ref 26.0–34.0)
MCHC: 32.4 g/dL (ref 30.0–36.0)
MCV: 90.8 fL (ref 80.0–100.0)
Monocytes Absolute: 1.4 10*3/uL — ABNORMAL HIGH (ref 0.1–1.0)
Monocytes Relative: 13 %
Neutro Abs: 6.4 10*3/uL (ref 1.7–7.7)
Neutrophils Relative %: 61 %
Platelets: 272 10*3/uL (ref 150–400)
RBC: 4.12 MIL/uL (ref 3.87–5.11)
RDW: 18.7 % — ABNORMAL HIGH (ref 11.5–15.5)
WBC: 10.6 10*3/uL — ABNORMAL HIGH (ref 4.0–10.5)
nRBC: 0 % (ref 0.0–0.2)

## 2020-08-01 LAB — CMP (CANCER CENTER ONLY)
ALT: 12 U/L (ref 0–44)
AST: 14 U/L — ABNORMAL LOW (ref 15–41)
Albumin: 3.9 g/dL (ref 3.5–5.0)
Alkaline Phosphatase: 76 U/L (ref 38–126)
Anion gap: 12 (ref 5–15)
BUN: 20 mg/dL (ref 6–20)
CO2: 25 mmol/L (ref 22–32)
Calcium: 9.3 mg/dL (ref 8.9–10.3)
Chloride: 102 mmol/L (ref 98–111)
Creatinine: 0.78 mg/dL (ref 0.44–1.00)
GFR, Estimated: >60 mL/min (ref >60)
Glucose, Bld: 113 mg/dL — ABNORMAL HIGH (ref 70–99)
Potassium: 4.1 mmol/L (ref 3.5–5.1)
Sodium: 139 mmol/L (ref 135–145)
Total Bilirubin: 0.5 mg/dL (ref 0.3–1.2)
Total Protein: 7.1 g/dL (ref 6.5–8.1)

## 2020-08-01 MED ORDER — DIPHENHYDRAMINE HCL 25 MG PO CAPS
25.0000 mg | ORAL_CAPSULE | Freq: Once | ORAL | Status: AC
  Administered 2020-08-01: 25 mg via ORAL

## 2020-08-01 MED ORDER — SODIUM CHLORIDE 0.9% FLUSH
10.0000 mL | Freq: Once | INTRAVENOUS | Status: AC
Start: 2020-08-01 — End: 2020-08-01
  Administered 2020-08-01: 10 mL
  Filled 2020-08-01: qty 10

## 2020-08-01 MED ORDER — SODIUM CHLORIDE 0.9 % IV SOLN
Freq: Once | INTRAVENOUS | Status: AC
Start: 2020-08-01 — End: 2020-08-01
  Filled 2020-08-01: qty 250

## 2020-08-01 MED ORDER — DEXTROSE 5 % IV SOLN
36.0000 mg/m2 | Freq: Once | INTRAVENOUS | Status: AC
  Administered 2020-08-01: 60 mg via INTRAVENOUS
  Filled 2020-08-01: qty 30

## 2020-08-01 MED ORDER — HEPARIN SOD (PORK) LOCK FLUSH 100 UNIT/ML IV SOLN
500.0000 [IU] | Freq: Once | INTRAVENOUS | Status: AC | PRN
  Administered 2020-08-01: 500 [IU]
  Filled 2020-08-01: qty 5

## 2020-08-01 MED ORDER — DEXAMETHASONE SODIUM PHOSPHATE 100 MG/10ML IJ SOLN
20.0000 mg | Freq: Once | INTRAMUSCULAR | Status: AC
  Administered 2020-08-01: 20 mg via INTRAVENOUS
  Filled 2020-08-01: qty 20

## 2020-08-01 MED ORDER — FAMOTIDINE 20 MG PO TABS
20.0000 mg | ORAL_TABLET | Freq: Once | ORAL | Status: AC
  Administered 2020-08-01: 20 mg via ORAL

## 2020-08-01 MED ORDER — AMOXICILLIN-POT CLAVULANATE 875-125 MG PO TABS: 1.0000 | ORAL_TABLET | Freq: Two times a day (BID) | ORAL | 0 refills | Status: AC

## 2020-08-01 MED ORDER — FAMOTIDINE 20 MG PO TABS
ORAL_TABLET | ORAL | Status: AC
  Filled 2020-08-01: qty 1

## 2020-08-01 MED ORDER — SODIUM CHLORIDE 0.9% FLUSH
10.0000 mL | INTRAVENOUS | Status: DC | PRN
  Administered 2020-08-01: 10 mL
  Filled 2020-08-01: qty 10

## 2020-08-01 MED ORDER — ACETAMINOPHEN 500 MG PO TABS
1000.0000 mg | ORAL_TABLET | Freq: Once | ORAL | Status: AC
  Administered 2020-08-01: 1000 mg via ORAL

## 2020-08-01 MED ORDER — ACETAMINOPHEN 500 MG PO TABS
ORAL_TABLET | ORAL | Status: AC
  Filled 2020-08-01: qty 2

## 2020-08-01 MED ORDER — DIPHENHYDRAMINE HCL 25 MG PO CAPS
ORAL_CAPSULE | ORAL | Status: AC
  Filled 2020-08-01: qty 1

## 2020-08-01 NOTE — Patient Instructions (Signed)
[facility] Discharge Instructions for Patients Receiving Chemotherapy  Today you received the following chemotherapy agents: Carfilzomib (Kyprolis)   To help prevent nausea and vomiting after your treatment, we encourage you to take your nausea medication  as prescribed.    If you develop nausea and vomiting that is not controlled by your nausea medication, call the clinic.   BELOW ARE SYMPTOMS THAT SHOULD BE REPORTED IMMEDIATELY:  *FEVER GREATER THAN 100.5 F  *CHILLS WITH OR WITHOUT FEVER  NAUSEA AND VOMITING THAT IS NOT CONTROLLED WITH YOUR NAUSEA MEDICATION  *UNUSUAL SHORTNESS OF BREATH  *UNUSUAL BRUISING OR BLEEDING  TENDERNESS IN MOUTH AND THROAT WITH OR WITHOUT PRESENCE OF ULCERS  *URINARY PROBLEMS  *BOWEL PROBLEMS  UNUSUAL RASH Items with * indicate a potential emergency and should be followed up as soon as possible.  Feel free to call the clinic should you have any questions or concerns. The clinic phone number is [phone].  Please show the CHEMO ALERT CARD at check-in to the Emergency Department and triage nurse.   

## 2020-08-01 NOTE — Telephone Encounter (Signed)
Spoke with patient's spouse 4/28 AM to confirm receipt of message yesterday and to add that Dr. Irene Limbo recommended use of tylenol PRN for temp elevation over 99. Spouse stated patient was taking tylenol at that time and her temp returns to normal following use. Confirmed with spouse that per Dr. Irene Limbo -  patient should come for appointments today. He verbalized understanding and stated they are on the way

## 2020-08-02 ENCOUNTER — Inpatient Hospital Stay: Payer: BC Managed Care – PPO

## 2020-08-02 VITALS — BP 144/72 | HR 99 | Temp 98.7°F | Resp 18

## 2020-08-02 DIAGNOSIS — Z7189 Other specified counseling: Secondary | ICD-10-CM

## 2020-08-02 DIAGNOSIS — C9000 Multiple myeloma not having achieved remission: Secondary | ICD-10-CM | POA: Diagnosis not present

## 2020-08-02 MED ORDER — SODIUM CHLORIDE 0.9% FLUSH
10.0000 mL | INTRAVENOUS | Status: DC | PRN
  Administered 2020-08-02: 10 mL
  Filled 2020-08-02: qty 10

## 2020-08-02 MED ORDER — FAMOTIDINE IN NACL 20-0.9 MG/50ML-% IV SOLN
INTRAVENOUS | Status: AC
  Filled 2020-08-02: qty 50

## 2020-08-02 MED ORDER — DEXAMETHASONE SODIUM PHOSPHATE 100 MG/10ML IJ SOLN
20.0000 mg | Freq: Once | INTRAMUSCULAR | Status: AC
  Administered 2020-08-02: 20 mg via INTRAVENOUS
  Filled 2020-08-02: qty 20

## 2020-08-02 MED ORDER — SODIUM CHLORIDE 0.9 % IV SOLN
Freq: Once | INTRAVENOUS | Status: DC
  Filled 2020-08-02: qty 250

## 2020-08-02 MED ORDER — DIPHENHYDRAMINE HCL 25 MG PO CAPS
25.0000 mg | ORAL_CAPSULE | Freq: Once | ORAL | Status: AC
  Administered 2020-08-02: 25 mg via ORAL

## 2020-08-02 MED ORDER — DIPHENHYDRAMINE HCL 25 MG PO CAPS
ORAL_CAPSULE | ORAL | Status: AC
  Filled 2020-08-02: qty 1

## 2020-08-02 MED ORDER — ACETAMINOPHEN 500 MG PO TABS
ORAL_TABLET | ORAL | Status: AC
  Filled 2020-08-02: qty 2

## 2020-08-02 MED ORDER — DEXTROSE 5 % IV SOLN
36.0000 mg/m2 | Freq: Once | INTRAVENOUS | Status: AC
  Administered 2020-08-02: 60 mg via INTRAVENOUS
  Filled 2020-08-02: qty 30

## 2020-08-02 MED ORDER — SODIUM CHLORIDE 0.9 % IV SOLN
Freq: Once | INTRAVENOUS | Status: AC
Start: 2020-08-02 — End: 2020-08-02
  Filled 2020-08-02: qty 250

## 2020-08-02 MED ORDER — HEPARIN SOD (PORK) LOCK FLUSH 100 UNIT/ML IV SOLN
500.0000 [IU] | Freq: Once | INTRAVENOUS | Status: AC | PRN
Start: 2020-08-02 — End: 2020-08-02
  Administered 2020-08-02: 500 [IU]
  Filled 2020-08-02: qty 5

## 2020-08-02 MED ORDER — FAMOTIDINE 20 MG PO TABS
ORAL_TABLET | ORAL | Status: AC
  Filled 2020-08-02: qty 1

## 2020-08-02 MED ORDER — ACETAMINOPHEN 500 MG PO TABS
1000.0000 mg | ORAL_TABLET | Freq: Once | ORAL | Status: AC
  Administered 2020-08-02: 1000 mg via ORAL

## 2020-08-02 MED ORDER — FAMOTIDINE 20 MG PO TABS
20.0000 mg | ORAL_TABLET | Freq: Once | ORAL | Status: AC
  Administered 2020-08-02: 20 mg via ORAL

## 2020-08-02 NOTE — Patient Instructions (Signed)
[facility]  Discharge Instructions: Thank you for choosing [facility] to provide your oncology and hematology care.   If you have a lab appointment with the [facility], please go directly to the [facility] and check in at the registration area.   Wear comfortable clothing and clothing appropriate for easy access to any Portacath or PICC line.   We strive to give you quality time with your provider. You may need to reschedule your appointment if you arrive late (15 or more minutes).  Arriving late affects you and other patients whose appointments are after yours.  Also, if you miss three or more appointments without notifying the office, you may be dismissed from the clinic at the provider's discretion.      For prescription refill requests, have your pharmacy contact our office and allow 72 hours for refills to be completed.    Today you received the following chemotherapy and/or immunotherapy agents: Kyprolis    To help prevent nausea and vomiting after your treatment, we encourage you to take your nausea medication as directed.  BELOW ARE SYMPTOMS THAT SHOULD BE REPORTED IMMEDIATELY: . *FEVER GREATER THAN 100.4 F (38 C) OR HIGHER . *CHILLS OR SWEATING . *NAUSEA AND VOMITING THAT IS NOT CONTROLLED WITH YOUR NAUSEA MEDICATION . *UNUSUAL SHORTNESS OF BREATH . *UNUSUAL BRUISING OR BLEEDING . *URINARY PROBLEMS (pain or burning when urinating, or frequent urination) . *BOWEL PROBLEMS (unusual diarrhea, constipation, pain near the anus) . TENDERNESS IN MOUTH AND THROAT WITH OR WITHOUT PRESENCE OF ULCERS (sore throat, sores in mouth, or a toothache) . UNUSUAL RASH, SWELLING OR PAIN  . UNUSUAL VAGINAL DISCHARGE OR ITCHING   Items with * indicate a potential emergency and should be followed up as soon as possible or go to the Emergency Department if any problems should occur.  Please show the CHEMOTHERAPY ALERT CARD or IMMUNOTHERAPY ALERT  CARD at check-in to the Emergency Department and triage nurse.  Should you have questions after your visit or need to cancel or reschedule your appointment, please contact [facility]  Dept: [phone]  and follow the prompts.  Office hours are 8:00 a.m. to 4:30 p.m. Monday - Friday. Please note that voicemails left after 4:00 p.m. may not be returned until the following business day.  We are closed weekends and major holidays. You have access to a nurse at all times for urgent questions. Please call the main number to the clinic Dept: [phone] and follow the prompts.   For any non-urgent questions, you may also contact your provider using MyChart. We now offer e-Visits for anyone 18 and older to request care online for non-urgent symptoms. For details visit [email or web address].   Also download the MyChart app! Go to the app store, search "MyChart", open the app, select [place], and log in with your MyChart username and password.  Due to Covid, a mask is required upon entering the hospital/clinic. If you do not have a mask, one will be given to you upon arrival. For doctor visits, patients may have 1 support person aged 18 or older with them. For treatment visits, patients cannot have anyone with them due to current Covid guidelines and our immunocompromised population.   

## 2020-08-03 ENCOUNTER — Other Ambulatory Visit: Payer: Self-pay | Admitting: Hematology

## 2020-08-03 DIAGNOSIS — C9000 Multiple myeloma not having achieved remission: Secondary | ICD-10-CM

## 2020-08-03 DIAGNOSIS — Z7189 Other specified counseling: Secondary | ICD-10-CM

## 2020-08-08 ENCOUNTER — Other Ambulatory Visit: Payer: Self-pay | Admitting: *Deleted

## 2020-08-08 ENCOUNTER — Other Ambulatory Visit: Payer: Self-pay

## 2020-08-08 ENCOUNTER — Other Ambulatory Visit: Payer: BC Managed Care – PPO

## 2020-08-08 ENCOUNTER — Inpatient Hospital Stay: Payer: BC Managed Care – PPO | Attending: Hematology

## 2020-08-08 ENCOUNTER — Telehealth: Payer: Self-pay | Admitting: Hematology

## 2020-08-08 ENCOUNTER — Inpatient Hospital Stay: Payer: BC Managed Care – PPO

## 2020-08-08 VITALS — BP 129/69 | HR 92 | Temp 98.6°F | Resp 18 | Wt 134.8 lb

## 2020-08-08 DIAGNOSIS — Z79899 Other long term (current) drug therapy: Secondary | ICD-10-CM | POA: Diagnosis not present

## 2020-08-08 DIAGNOSIS — C9000 Multiple myeloma not having achieved remission: Secondary | ICD-10-CM | POA: Diagnosis not present

## 2020-08-08 DIAGNOSIS — Z5112 Encounter for antineoplastic immunotherapy: Secondary | ICD-10-CM | POA: Diagnosis not present

## 2020-08-08 DIAGNOSIS — Z7189 Other specified counseling: Secondary | ICD-10-CM

## 2020-08-08 DIAGNOSIS — Z95828 Presence of other vascular implants and grafts: Secondary | ICD-10-CM

## 2020-08-08 LAB — CMP (CANCER CENTER ONLY)
ALT: 10 U/L (ref 0–44)
AST: 16 U/L (ref 15–41)
Albumin: 3.8 g/dL (ref 3.5–5.0)
Alkaline Phosphatase: 76 U/L (ref 38–126)
Anion gap: 13 (ref 5–15)
BUN: 19 mg/dL (ref 6–20)
CO2: 24 mmol/L (ref 22–32)
Calcium: 9.1 mg/dL (ref 8.9–10.3)
Chloride: 103 mmol/L (ref 98–111)
Creatinine: 0.81 mg/dL (ref 0.44–1.00)
GFR, Estimated: >60 mL/min (ref >60)
Glucose, Bld: 124 mg/dL — ABNORMAL HIGH (ref 70–99)
Potassium: 3.7 mmol/L (ref 3.5–5.1)
Sodium: 140 mmol/L (ref 135–145)
Total Bilirubin: 0.6 mg/dL (ref 0.3–1.2)
Total Protein: 6.6 g/dL (ref 6.5–8.1)

## 2020-08-08 LAB — CBC WITH DIFFERENTIAL/PLATELET
Abs Immature Granulocytes: 0.77 10*3/uL — ABNORMAL HIGH (ref 0.00–0.07)
Basophils Absolute: 0 10*3/uL (ref 0.0–0.1)
Basophils Relative: 0 %
Eosinophils Absolute: 0.3 10*3/uL (ref 0.0–0.5)
Eosinophils Relative: 3 %
HCT: 37.7 % (ref 36.0–46.0)
Hemoglobin: 12.2 g/dL (ref 12.0–15.0)
Immature Granulocytes: 7 %
Lymphocytes Relative: 20 %
Lymphs Abs: 2.3 10*3/uL (ref 0.7–4.0)
MCH: 29 pg (ref 26.0–34.0)
MCHC: 32.4 g/dL (ref 30.0–36.0)
MCV: 89.8 fL (ref 80.0–100.0)
Monocytes Absolute: 0.9 10*3/uL (ref 0.1–1.0)
Monocytes Relative: 8 %
Neutro Abs: 7 10*3/uL (ref 1.7–7.7)
Neutrophils Relative %: 62 %
Platelets: 190 10*3/uL (ref 150–400)
RBC: 4.2 MIL/uL (ref 3.87–5.11)
RDW: 17.7 % — ABNORMAL HIGH (ref 11.5–15.5)
WBC: 11.2 10*3/uL — ABNORMAL HIGH (ref 4.0–10.5)
nRBC: 0 % (ref 0.0–0.2)

## 2020-08-08 MED ORDER — DIPHENHYDRAMINE HCL 25 MG PO CAPS
25.0000 mg | ORAL_CAPSULE | Freq: Once | ORAL | Status: AC
  Administered 2020-08-08: 25 mg via ORAL

## 2020-08-08 MED ORDER — ACETAMINOPHEN 500 MG PO TABS
1000.0000 mg | ORAL_TABLET | Freq: Once | ORAL | Status: AC
Start: 2020-08-08 — End: 2020-08-08
  Administered 2020-08-08: 1000 mg via ORAL

## 2020-08-08 MED ORDER — HEPARIN SOD (PORK) LOCK FLUSH 100 UNIT/ML IV SOLN
500.0000 [IU] | Freq: Once | INTRAVENOUS | Status: AC | PRN
  Administered 2020-08-08: 500 [IU]
  Filled 2020-08-08: qty 5

## 2020-08-08 MED ORDER — SODIUM CHLORIDE 0.9% FLUSH
10.0000 mL | Freq: Once | INTRAVENOUS | Status: AC
  Administered 2020-08-08: 10 mL
  Filled 2020-08-08: qty 10

## 2020-08-08 MED ORDER — SODIUM CHLORIDE 0.9% FLUSH
10.0000 mL | INTRAVENOUS | Status: DC | PRN
  Administered 2020-08-08: 10 mL
  Filled 2020-08-08: qty 10

## 2020-08-08 MED ORDER — DEXTROSE 5 % IV SOLN
36.0000 mg/m2 | Freq: Once | INTRAVENOUS | Status: AC
  Administered 2020-08-08: 60 mg via INTRAVENOUS
  Filled 2020-08-08: qty 30

## 2020-08-08 MED ORDER — FAMOTIDINE 20 MG PO TABS
ORAL_TABLET | ORAL | Status: AC
  Filled 2020-08-08: qty 1

## 2020-08-08 MED ORDER — SODIUM CHLORIDE 0.9 % IV SOLN
Freq: Once | INTRAVENOUS | Status: AC
  Filled 2020-08-08: qty 250

## 2020-08-08 MED ORDER — SODIUM CHLORIDE 0.9 % IV SOLN
20.0000 mg | Freq: Once | INTRAVENOUS | Status: AC
  Administered 2020-08-08: 20 mg via INTRAVENOUS
  Filled 2020-08-08: qty 20

## 2020-08-08 MED ORDER — DIPHENHYDRAMINE HCL 25 MG PO CAPS
ORAL_CAPSULE | ORAL | Status: AC
  Filled 2020-08-08: qty 1

## 2020-08-08 MED ORDER — FAMOTIDINE 20 MG PO TABS
20.0000 mg | ORAL_TABLET | Freq: Once | ORAL | Status: AC
  Administered 2020-08-08: 20 mg via ORAL

## 2020-08-08 MED ORDER — ACETAMINOPHEN 500 MG PO TABS
ORAL_TABLET | ORAL | Status: AC
  Filled 2020-08-08: qty 2

## 2020-08-08 NOTE — Patient Instructions (Signed)
[facility]  Discharge Instructions: Thank you for choosing [facility] to provide your oncology and hematology care.   If you have a lab appointment with the Cancer Center, please go directly to the Cancer Center and check in at the registration area.   Wear comfortable clothing and clothing appropriate for easy access to any Portacath or PICC line.   We strive to give you quality time with your provider. You may need to reschedule your appointment if you arrive late (15 or more minutes).  Arriving late affects you and other patients whose appointments are after yours.  Also, if you miss three or more appointments without notifying the office, you may be dismissed from the clinic at the provider's discretion.      For prescription refill requests, have your pharmacy contact our office and allow 72 hours for refills to be completed.    Today you received the following chemotherapy and/or immunotherapy agents kyprolis      To help prevent nausea and vomiting after your treatment, we encourage you to take your nausea medication as directed.  BELOW ARE SYMPTOMS THAT SHOULD BE REPORTED IMMEDIATELY: . *FEVER GREATER THAN 100.4 F (38 C) OR HIGHER . *CHILLS OR SWEATING . *NAUSEA AND VOMITING THAT IS NOT CONTROLLED WITH YOUR NAUSEA MEDICATION . *UNUSUAL SHORTNESS OF BREATH . *UNUSUAL BRUISING OR BLEEDING . *URINARY PROBLEMS (pain or burning when urinating, or frequent urination) . *BOWEL PROBLEMS (unusual diarrhea, constipation, pain near the anus) . TENDERNESS IN MOUTH AND THROAT WITH OR WITHOUT PRESENCE OF ULCERS (sore throat, sores in mouth, or a toothache) . UNUSUAL RASH, SWELLING OR PAIN  . UNUSUAL VAGINAL DISCHARGE OR ITCHING   Items with * indicate a potential emergency and should be followed up as soon as possible or go to the Emergency Department if any problems should occur.  Please show the CHEMOTHERAPY ALERT CARD or IMMUNOTHERAPY ALERT  CARD at check-in to the Emergency Department and triage nurse.  Should you have questions after your visit or need to cancel or reschedule your appointment, please contact [facility]  Dept: [phone]  and follow the prompts.  Office hours are 8:00 a.m. to 4:30 p.m. Monday - Friday. Please note that voicemails left after 4:00 p.m. may not be returned until the following business day.  We are closed weekends and major holidays. You have access to a nurse at all times for urgent questions. Please call the main number to the clinic Dept: [phone] and follow the prompts.   For any non-urgent questions, you may also contact your provider using MyChart. We now offer e-Visits for anyone 18 and older to request care online for non-urgent symptoms. For details visit [email or web address].   Also download the MyChart app! Go to the app store, search "MyChart", open the app, select [facility], and log in with your MyChart username and password.  Due to Covid, a mask is required upon entering the hospital/clinic. If you do not have a mask, one will be given to you upon arrival. For doctor visits, patients may have 1 support person aged 18 or older with them. For treatment visits, patients cannot have anyone with them due to current Covid guidelines and our immunocompromised population.   

## 2020-08-08 NOTE — Telephone Encounter (Signed)
Scheduled appts per 5/4 sch msg. Updated calendar printed for pt in infusion per RN [place].

## 2020-08-09 ENCOUNTER — Inpatient Hospital Stay: Payer: BC Managed Care – PPO

## 2020-08-09 VITALS — BP 145/72 | HR 89 | Temp 98.6°F | Resp 18

## 2020-08-09 DIAGNOSIS — Z7189 Other specified counseling: Secondary | ICD-10-CM

## 2020-08-09 DIAGNOSIS — C9000 Multiple myeloma not having achieved remission: Secondary | ICD-10-CM

## 2020-08-09 DIAGNOSIS — Z5112 Encounter for antineoplastic immunotherapy: Secondary | ICD-10-CM | POA: Diagnosis not present

## 2020-08-09 LAB — KAPPA/LAMBDA LIGHT CHAINS
Kappa free light chain: 8.2 mg/L (ref 3.3–19.4)
Kappa, lambda light chain ratio: 1.26 (ref 0.26–1.65)
Lambda free light chains: 6.5 mg/L (ref 5.7–26.3)

## 2020-08-09 MED ORDER — FAMOTIDINE 20 MG PO TABS
20.0000 mg | ORAL_TABLET | Freq: Once | ORAL | Status: AC
  Administered 2020-08-09: 20 mg via ORAL

## 2020-08-09 MED ORDER — SODIUM CHLORIDE 0.9 % IV SOLN
20.0000 mg | Freq: Once | INTRAVENOUS | Status: AC
  Administered 2020-08-09: 20 mg via INTRAVENOUS
  Filled 2020-08-09: qty 20

## 2020-08-09 MED ORDER — SODIUM CHLORIDE 0.9 % IV SOLN
Freq: Once | INTRAVENOUS | Status: AC
Start: 2020-08-09 — End: 2020-08-09
  Filled 2020-08-09: qty 250

## 2020-08-09 MED ORDER — FAMOTIDINE 20 MG PO TABS
ORAL_TABLET | ORAL | Status: AC
  Filled 2020-08-09: qty 1

## 2020-08-09 MED ORDER — DEXTROSE 5 % IV SOLN
36.0000 mg/m2 | Freq: Once | INTRAVENOUS | Status: AC
  Administered 2020-08-09: 60 mg via INTRAVENOUS
  Filled 2020-08-09: qty 30

## 2020-08-09 MED ORDER — SODIUM CHLORIDE 0.9% FLUSH
10.0000 mL | INTRAVENOUS | Status: DC | PRN
  Administered 2020-08-09: 10 mL
  Filled 2020-08-09: qty 10

## 2020-08-09 MED ORDER — HEPARIN SOD (PORK) LOCK FLUSH 100 UNIT/ML IV SOLN
500.0000 [IU] | Freq: Once | INTRAVENOUS | Status: AC | PRN
  Administered 2020-08-09: 500 [IU]
  Filled 2020-08-09: qty 5

## 2020-08-09 MED ORDER — DIPHENHYDRAMINE HCL 25 MG PO CAPS
ORAL_CAPSULE | ORAL | Status: AC
  Filled 2020-08-09: qty 1

## 2020-08-09 MED ORDER — DIPHENHYDRAMINE HCL 25 MG PO CAPS
25.0000 mg | ORAL_CAPSULE | Freq: Once | ORAL | Status: AC
  Administered 2020-08-09: 25 mg via ORAL

## 2020-08-09 MED ORDER — ACETAMINOPHEN 500 MG PO TABS
ORAL_TABLET | ORAL | Status: AC
  Filled 2020-08-09: qty 2

## 2020-08-09 MED ORDER — ACETAMINOPHEN 500 MG PO TABS
1000.0000 mg | ORAL_TABLET | Freq: Once | ORAL | Status: AC
  Administered 2020-08-09: 1000 mg via ORAL

## 2020-08-09 MED ORDER — SODIUM CHLORIDE 0.9 % IV SOLN
Freq: Once | INTRAVENOUS | Status: DC
  Filled 2020-08-09: qty 250

## 2020-08-09 NOTE — Patient Instructions (Signed)
[facility]  Discharge Instructions: Thank you for choosing [facility] to provide your oncology and hematology care.   If you have a lab appointment with the Cancer Center, please go directly to the Cancer Center and check in at the registration area.   Wear comfortable clothing and clothing appropriate for easy access to any Portacath or PICC line.   We strive to give you quality time with your provider. You may need to reschedule your appointment if you arrive late (15 or more minutes).  Arriving late affects you and other patients whose appointments are after yours.  Also, if you miss three or more appointments without notifying the office, you may be dismissed from the clinic at the provider's discretion.      For prescription refill requests, have your pharmacy contact our office and allow 72 hours for refills to be completed.    Today you received the following chemotherapy and/or immunotherapy agents kyprolis      To help prevent nausea and vomiting after your treatment, we encourage you to take your nausea medication as directed.  BELOW ARE SYMPTOMS THAT SHOULD BE REPORTED IMMEDIATELY: . *FEVER GREATER THAN 100.4 F (38 C) OR HIGHER . *CHILLS OR SWEATING . *NAUSEA AND VOMITING THAT IS NOT CONTROLLED WITH YOUR NAUSEA MEDICATION . *UNUSUAL SHORTNESS OF BREATH . *UNUSUAL BRUISING OR BLEEDING . *URINARY PROBLEMS (pain or burning when urinating, or frequent urination) . *BOWEL PROBLEMS (unusual diarrhea, constipation, pain near the anus) . TENDERNESS IN MOUTH AND THROAT WITH OR WITHOUT PRESENCE OF ULCERS (sore throat, sores in mouth, or a toothache) . UNUSUAL RASH, SWELLING OR PAIN  . UNUSUAL VAGINAL DISCHARGE OR ITCHING   Items with * indicate a potential emergency and should be followed up as soon as possible or go to the Emergency Department if any problems should occur.  Please show the CHEMOTHERAPY ALERT CARD or IMMUNOTHERAPY ALERT  CARD at check-in to the Emergency Department and triage nurse.  Should you have questions after your visit or need to cancel or reschedule your appointment, please contact [facility]  Dept: [phone]  and follow the prompts.  Office hours are 8:00 a.m. to 4:30 p.m. Monday - Friday. Please note that voicemails left after 4:00 p.m. may not be returned until the following business day.  We are closed weekends and major holidays. You have access to a nurse at all times for urgent questions. Please call the main number to the clinic Dept: [phone] and follow the prompts.   For any non-urgent questions, you may also contact your provider using MyChart. We now offer e-Visits for anyone 18 and older to request care online for non-urgent symptoms. For details visit [email or web address].   Also download the MyChart app! Go to the app store, search "MyChart", open the app, select [facility], and log in with your MyChart username and password.  Due to Covid, a mask is required upon entering the hospital/clinic. If you do not have a mask, one will be given to you upon arrival. For doctor visits, patients may have 1 support person aged 18 or older with them. For treatment visits, patients cannot have anyone with them due to current Covid guidelines and our immunocompromised population.   

## 2020-08-12 LAB — MULTIPLE MYELOMA PANEL, SERUM
Albumin SerPl Elph-Mcnc: 3.6 g/dL (ref 2.9–4.4)
Albumin/Glob SerPl: 1.6 (ref 0.7–1.7)
Alpha 1: 0.2 g/dL (ref 0.0–0.4)
Alpha2 Glob SerPl Elph-Mcnc: 0.8 g/dL (ref 0.4–1.0)
B-Globulin SerPl Elph-Mcnc: 1.1 g/dL (ref 0.7–1.3)
Gamma Glob SerPl Elph-Mcnc: 0.3 g/dL — ABNORMAL LOW (ref 0.4–1.8)
Globulin, Total: 2.4 g/dL (ref 2.2–3.9)
IgA: 542 mg/dL — ABNORMAL HIGH (ref 87–352)
IgG (Immunoglobin G), Serum: 340 mg/dL — ABNORMAL LOW (ref 586–1602)
IgM (Immunoglobulin M), Srm: 16 mg/dL — ABNORMAL LOW (ref 26–217)
M Protein SerPl Elph-Mcnc: 0.4 g/dL — ABNORMAL HIGH
Total Protein ELP: 6 g/dL (ref 6.0–8.5)

## 2020-08-15 ENCOUNTER — Other Ambulatory Visit: Payer: Self-pay

## 2020-08-15 ENCOUNTER — Inpatient Hospital Stay: Payer: BC Managed Care – PPO

## 2020-08-15 ENCOUNTER — Other Ambulatory Visit: Payer: BC Managed Care – PPO

## 2020-08-15 VITALS — BP 140/68 | HR 87 | Temp 97.8°F | Resp 18 | Wt 138.5 lb

## 2020-08-15 DIAGNOSIS — Z95828 Presence of other vascular implants and grafts: Secondary | ICD-10-CM

## 2020-08-15 DIAGNOSIS — C9000 Multiple myeloma not having achieved remission: Secondary | ICD-10-CM

## 2020-08-15 DIAGNOSIS — Z7189 Other specified counseling: Secondary | ICD-10-CM

## 2020-08-15 DIAGNOSIS — Z5112 Encounter for antineoplastic immunotherapy: Secondary | ICD-10-CM | POA: Diagnosis not present

## 2020-08-15 LAB — CMP (CANCER CENTER ONLY)
ALT: 15 U/L (ref 0–44)
AST: 16 U/L (ref 15–41)
Albumin: 3.7 g/dL (ref 3.5–5.0)
Alkaline Phosphatase: 71 U/L (ref 38–126)
Anion gap: 13 (ref 5–15)
BUN: 28 mg/dL — ABNORMAL HIGH (ref 6–20)
CO2: 23 mmol/L (ref 22–32)
Calcium: 8.8 mg/dL — ABNORMAL LOW (ref 8.9–10.3)
Chloride: 104 mmol/L (ref 98–111)
Creatinine: 0.84 mg/dL (ref 0.44–1.00)
GFR, Estimated: >60 mL/min (ref >60)
Glucose, Bld: 117 mg/dL — ABNORMAL HIGH (ref 70–99)
Potassium: 4.1 mmol/L (ref 3.5–5.1)
Sodium: 140 mmol/L (ref 135–145)
Total Bilirubin: 0.5 mg/dL (ref 0.3–1.2)
Total Protein: 6.2 g/dL — ABNORMAL LOW (ref 6.5–8.1)

## 2020-08-15 LAB — CBC WITH DIFFERENTIAL/PLATELET
Abs Immature Granulocytes: 0.27 10*3/uL — ABNORMAL HIGH (ref 0.00–0.07)
Basophils Absolute: 0.1 10*3/uL (ref 0.0–0.1)
Basophils Relative: 1 %
Eosinophils Absolute: 0.4 10*3/uL (ref 0.0–0.5)
Eosinophils Relative: 3 %
HCT: 35.9 % — ABNORMAL LOW (ref 36.0–46.0)
Hemoglobin: 11.6 g/dL — ABNORMAL LOW (ref 12.0–15.0)
Immature Granulocytes: 2 %
Lymphocytes Relative: 18 %
Lymphs Abs: 2.2 10*3/uL (ref 0.7–4.0)
MCH: 29.2 pg (ref 26.0–34.0)
MCHC: 32.3 g/dL (ref 30.0–36.0)
MCV: 90.4 fL (ref 80.0–100.0)
Monocytes Absolute: 1.5 10*3/uL — ABNORMAL HIGH (ref 0.1–1.0)
Monocytes Relative: 12 %
Neutro Abs: 7.7 10*3/uL (ref 1.7–7.7)
Neutrophils Relative %: 64 %
Platelets: 188 10*3/uL (ref 150–400)
RBC: 3.97 MIL/uL (ref 3.87–5.11)
RDW: 17.5 % — ABNORMAL HIGH (ref 11.5–15.5)
WBC: 12.1 10*3/uL — ABNORMAL HIGH (ref 4.0–10.5)
nRBC: 0 % (ref 0.0–0.2)

## 2020-08-15 MED ORDER — DEXTROSE 5 % IV SOLN
36.0000 mg/m2 | Freq: Once | INTRAVENOUS | Status: AC
  Administered 2020-08-15: 60 mg via INTRAVENOUS
  Filled 2020-08-15: qty 30

## 2020-08-15 MED ORDER — FAMOTIDINE 20 MG PO TABS
ORAL_TABLET | ORAL | Status: AC
  Filled 2020-08-15: qty 1

## 2020-08-15 MED ORDER — HEPARIN SOD (PORK) LOCK FLUSH 100 UNIT/ML IV SOLN
500.0000 [IU] | Freq: Once | INTRAVENOUS | Status: AC | PRN
  Administered 2020-08-15: 500 [IU]
  Filled 2020-08-15: qty 5

## 2020-08-15 MED ORDER — SODIUM CHLORIDE 0.9% FLUSH
10.0000 mL | Freq: Once | INTRAVENOUS | Status: AC
  Administered 2020-08-15: 10 mL
  Filled 2020-08-15: qty 10

## 2020-08-15 MED ORDER — DIPHENHYDRAMINE HCL 25 MG PO CAPS
25.0000 mg | ORAL_CAPSULE | Freq: Once | ORAL | Status: AC
  Administered 2020-08-15: 25 mg via ORAL

## 2020-08-15 MED ORDER — SODIUM CHLORIDE 0.9 % IV SOLN
Freq: Once | INTRAVENOUS | Status: AC
  Filled 2020-08-15: qty 250

## 2020-08-15 MED ORDER — FAMOTIDINE 20 MG PO TABS
20.0000 mg | ORAL_TABLET | Freq: Once | ORAL | Status: AC
  Administered 2020-08-15: 20 mg via ORAL

## 2020-08-15 MED ORDER — ACETAMINOPHEN 500 MG PO TABS
ORAL_TABLET | ORAL | Status: AC
  Filled 2020-08-15: qty 2

## 2020-08-15 MED ORDER — DEXAMETHASONE SODIUM PHOSPHATE 100 MG/10ML IJ SOLN
20.0000 mg | Freq: Once | INTRAMUSCULAR | Status: AC
  Administered 2020-08-15: 20 mg via INTRAVENOUS
  Filled 2020-08-15: qty 20

## 2020-08-15 MED ORDER — DIPHENHYDRAMINE HCL 25 MG PO CAPS
ORAL_CAPSULE | ORAL | Status: AC
  Filled 2020-08-15: qty 1

## 2020-08-15 MED ORDER — SODIUM CHLORIDE 0.9% FLUSH
10.0000 mL | INTRAVENOUS | Status: DC | PRN
Start: 2020-08-15 — End: 2020-08-15
  Administered 2020-08-15: 10 mL
  Filled 2020-08-15: qty 10

## 2020-08-15 MED ORDER — ACETAMINOPHEN 500 MG PO TABS
1000.0000 mg | ORAL_TABLET | Freq: Once | ORAL | Status: AC
  Administered 2020-08-15: 1000 mg via ORAL

## 2020-08-15 NOTE — Patient Instructions (Signed)
[facility]  Discharge Instructions: Thank you for choosing [facility] to provide your oncology and hematology care.   If you have a lab appointment with the Cancer Center, please go directly to the Cancer Center and check in at the registration area.   Wear comfortable clothing and clothing appropriate for easy access to any Portacath or PICC line.   We strive to give you quality time with your provider. You may need to reschedule your appointment if you arrive late (15 or more minutes).  Arriving late affects you and other patients whose appointments are after yours.  Also, if you miss three or more appointments without notifying the office, you may be dismissed from the clinic at the provider's discretion.      For prescription refill requests, have your pharmacy contact our office and allow 72 hours for refills to be completed.    Today you received the following chemotherapy and/or immunotherapy agents kyprolis      To help prevent nausea and vomiting after your treatment, we encourage you to take your nausea medication as directed.  BELOW ARE SYMPTOMS THAT SHOULD BE REPORTED IMMEDIATELY: . *FEVER GREATER THAN 100.4 F (38 C) OR HIGHER . *CHILLS OR SWEATING . *NAUSEA AND VOMITING THAT IS NOT CONTROLLED WITH YOUR NAUSEA MEDICATION . *UNUSUAL SHORTNESS OF BREATH . *UNUSUAL BRUISING OR BLEEDING . *URINARY PROBLEMS (pain or burning when urinating, or frequent urination) . *BOWEL PROBLEMS (unusual diarrhea, constipation, pain near the anus) . TENDERNESS IN MOUTH AND THROAT WITH OR WITHOUT PRESENCE OF ULCERS (sore throat, sores in mouth, or a toothache) . UNUSUAL RASH, SWELLING OR PAIN  . UNUSUAL VAGINAL DISCHARGE OR ITCHING   Items with * indicate a potential emergency and should be followed up as soon as possible or go to the Emergency Department if any problems should occur.  Please show the CHEMOTHERAPY ALERT CARD or IMMUNOTHERAPY ALERT  CARD at check-in to the Emergency Department and triage nurse.  Should you have questions after your visit or need to cancel or reschedule your appointment, please contact [facility]  Dept: [phone]  and follow the prompts.  Office hours are 8:00 a.m. to 4:30 p.m. Monday - Friday. Please note that voicemails left after 4:00 p.m. may not be returned until the following business day.  We are closed weekends and major holidays. You have access to a nurse at all times for urgent questions. Please call the main number to the clinic Dept: [phone] and follow the prompts.   For any non-urgent questions, you may also contact your provider using MyChart. We now offer e-Visits for anyone 18 and older to request care online for non-urgent symptoms. For details visit [email or web address].   Also download the MyChart app! Go to the app store, search "MyChart", open the app, select [facility], and log in with your MyChart username and password.  Due to Covid, a mask is required upon entering the hospital/clinic. If you do not have a mask, one will be given to you upon arrival. For doctor visits, patients may have 1 support person aged 18 or older with them. For treatment visits, patients cannot have anyone with them due to current Covid guidelines and our immunocompromised population.   

## 2020-08-16 ENCOUNTER — Inpatient Hospital Stay: Payer: BC Managed Care – PPO

## 2020-08-16 VITALS — BP 153/66 | HR 72 | Temp 98.2°F | Resp 16

## 2020-08-16 DIAGNOSIS — Z7189 Other specified counseling: Secondary | ICD-10-CM

## 2020-08-16 DIAGNOSIS — C9000 Multiple myeloma not having achieved remission: Secondary | ICD-10-CM

## 2020-08-16 DIAGNOSIS — Z5112 Encounter for antineoplastic immunotherapy: Secondary | ICD-10-CM | POA: Diagnosis not present

## 2020-08-16 MED ORDER — DEXAMETHASONE SODIUM PHOSPHATE 100 MG/10ML IJ SOLN
20.0000 mg | Freq: Once | INTRAMUSCULAR | Status: AC
  Administered 2020-08-16: 20 mg via INTRAVENOUS
  Filled 2020-08-16: qty 20

## 2020-08-16 MED ORDER — HEPARIN SOD (PORK) LOCK FLUSH 100 UNIT/ML IV SOLN
500.0000 [IU] | Freq: Once | INTRAVENOUS | Status: AC | PRN
  Administered 2020-08-16: 500 [IU]
  Filled 2020-08-16: qty 5

## 2020-08-16 MED ORDER — SODIUM CHLORIDE 0.9% FLUSH
10.0000 mL | INTRAVENOUS | Status: DC | PRN
  Administered 2020-08-16: 10 mL
  Filled 2020-08-16: qty 10

## 2020-08-16 MED ORDER — FAMOTIDINE 20 MG PO TABS
ORAL_TABLET | ORAL | Status: AC
  Filled 2020-08-16: qty 1

## 2020-08-16 MED ORDER — FAMOTIDINE 20 MG PO TABS
20.0000 mg | ORAL_TABLET | Freq: Once | ORAL | Status: AC
  Administered 2020-08-16: 20 mg via ORAL

## 2020-08-16 MED ORDER — DEXTROSE 5 % IV SOLN
36.0000 mg/m2 | Freq: Once | INTRAVENOUS | Status: AC
  Administered 2020-08-16: 60 mg via INTRAVENOUS
  Filled 2020-08-16: qty 30

## 2020-08-16 MED ORDER — SODIUM CHLORIDE 0.9 % IV SOLN
Freq: Once | INTRAVENOUS | Status: AC
Start: 2020-08-16 — End: 2020-08-16
  Filled 2020-08-16: qty 250

## 2020-08-16 MED ORDER — DIPHENHYDRAMINE HCL 25 MG PO CAPS
25.0000 mg | ORAL_CAPSULE | Freq: Once | ORAL | Status: AC
  Administered 2020-08-16: 25 mg via ORAL

## 2020-08-16 MED ORDER — ACETAMINOPHEN 500 MG PO TABS
ORAL_TABLET | ORAL | Status: AC
  Filled 2020-08-16: qty 2

## 2020-08-16 MED ORDER — ACETAMINOPHEN 500 MG PO TABS
1000.0000 mg | ORAL_TABLET | Freq: Once | ORAL | Status: AC
  Administered 2020-08-16: 1000 mg via ORAL

## 2020-08-16 MED ORDER — DIPHENHYDRAMINE HCL 25 MG PO CAPS
ORAL_CAPSULE | ORAL | Status: AC
  Filled 2020-08-16: qty 1

## 2020-08-16 NOTE — Patient Instructions (Signed)
[facility]  Discharge Instructions: Thank you for choosing [facility] to provide your oncology and hematology care.   If you have a lab appointment with the Cancer Center, please go directly to the Cancer Center and check in at the registration area.   Wear comfortable clothing and clothing appropriate for easy access to any Portacath or PICC line.   We strive to give you quality time with your provider. You may need to reschedule your appointment if you arrive late (15 or more minutes).  Arriving late affects you and other patients whose appointments are after yours.  Also, if you miss three or more appointments without notifying the office, you may be dismissed from the clinic at the provider's discretion.      For prescription refill requests, have your pharmacy contact our office and allow 72 hours for refills to be completed.    Today you received the following chemotherapy and/or immunotherapy agents carfilzomib   To help prevent nausea and vomiting after your treatment, we encourage you to take your nausea medication as directed.  BELOW ARE SYMPTOMS THAT SHOULD BE REPORTED IMMEDIATELY: . *FEVER GREATER THAN 100.4 F (38 C) OR HIGHER . *CHILLS OR SWEATING . *NAUSEA AND VOMITING THAT IS NOT CONTROLLED WITH YOUR NAUSEA MEDICATION . *UNUSUAL SHORTNESS OF BREATH . *UNUSUAL BRUISING OR BLEEDING . *URINARY PROBLEMS (pain or burning when urinating, or frequent urination) . *BOWEL PROBLEMS (unusual diarrhea, constipation, pain near the anus) . TENDERNESS IN MOUTH AND THROAT WITH OR WITHOUT PRESENCE OF ULCERS (sore throat, sores in mouth, or a toothache) . UNUSUAL RASH, SWELLING OR PAIN  . UNUSUAL VAGINAL DISCHARGE OR ITCHING   Items with * indicate a potential emergency and should be followed up as soon as possible or go to the Emergency Department if any problems should occur.  Please show the CHEMOTHERAPY ALERT CARD or IMMUNOTHERAPY ALERT  CARD at check-in to the Emergency Department and triage nurse.  Should you have questions after your visit or need to cancel or reschedule your appointment, please contact [facility]  Dept: [phone]  and follow the prompts.  Office hours are 8:00 a.m. to 4:30 p.m. Monday - Friday. Please note that voicemails left after 4:00 p.m. may not be returned until the following business day.  We are closed weekends and major holidays. You have access to a nurse at all times for urgent questions. Please call the main number to the clinic Dept: [phone] and follow the prompts.   For any non-urgent questions, you may also contact your provider using MyChart. We now offer e-Visits for anyone 18 and older to request care online for non-urgent symptoms. For details visit [email or web address].   Also download the MyChart app! Go to the app store, search "MyChart", open the app, select [facility], and log in with your MyChart username and password.  Due to Covid, a mask is required upon entering the hospital/clinic. If you do not have a mask, one will be given to you upon arrival. For doctor visits, patients may have 1 support person aged 18 or older with them. For treatment visits, patients cannot have anyone with them due to current Covid guidelines and our immunocompromised population.   

## 2020-08-19 ENCOUNTER — Other Ambulatory Visit: Payer: Self-pay | Admitting: Hematology

## 2020-08-19 DIAGNOSIS — Z7189 Other specified counseling: Secondary | ICD-10-CM

## 2020-08-19 DIAGNOSIS — C9000 Multiple myeloma not having achieved remission: Secondary | ICD-10-CM

## 2020-08-26 ENCOUNTER — Encounter: Payer: Self-pay | Admitting: Hematology

## 2020-08-28 NOTE — Progress Notes (Signed)
HEMATOLOGY/ONCOLOGY CONSULTATION NOTE  Date of Service: 08/29/2020  Patient Care Team: [name], MD as PCP - General (Family Medicine) [name], [name], MD as PCP - Cardiology (Cardiology) [name] [name], MD as Consulting Physician (Gastroenterology)  CHIEF COMPLAINTS/PURPOSE OF CONSULTATION:  Multiple Myeloma  HISTORY OF PRESENTING ILLNESS:   Megan Velasquez is a wonderful 60 y.o. female who has been referred to [place] for evaluation and management of multiple myeloma. Pt is accompanied today by her husband for this visit.    Patient was referred to [place] for second opinion regarding management of newly diagnosed multiple myeloma by Dr. Laurena Slimmer MD.  Patient had been referred to to [facility] cancer center by her primary care physician for anemia and elevated serum protein levels.  She also had some increasing fatigue and tingling in her hands and feet.  Noted primarily in the right wrist and right ankle.  B12 levels were noted to be borderline low.  She had a work-up including labs which showed hemoglobin of 9.7 with an MCV of 86.9 WBC count of 6.95k and platelets of 165k. CMP showed a creatinine of 1.06 calcium level of 8.9 total protein of 10.2 with an albumin of 2.7 normal liver function tests and other electrolytes. Serum protein electrophoresis with an M spike of 4.3 with an additional M spike of 0.2 g/dL.  IgA quantitative more than 6400. B12 levels 276, homocystine 15.9 Serum folate 26.5 LDH 96 Beta-2 microglobulin of 2.5  Serum kappa lambda free light chains showed free kappa light chains of 181, free lambda light chains of 3.6 and a free kappa lambda ratio of 50.3.  24-hour UPEP showed no M spike and a total protein of 5.7 mg/dL.  Bone Survey completed on 03/08/2020 with results revealing "No evidence of lytic nor blastic lesions within the appendicular or axial skeleton. Degenerative changes as described above."   On review of systems, pt reports some mild fatigue.  No  focal bone pains.  No chest pain.  No shortness of breath. No evidence of GI bleeding no hematuria no nosebleeds or gum bleeds. No other acute new focal symptoms. Denies any history of known cardiopulmonary disease strokes or heart attacks.  INTERVAL HISTORY  Megan Velasquez is a wonderful 60 y.o. female who is here today for evaluation and management of multiple myeloma. The patient's last visit with [place] was on 08/01/2020. The pt reports that she is doing well overall. She is here for toxicity check prior to [place].  The pt reports no new concerns or symptoms. She has not had any more fevers. She has been sleeping and eating well. She notes that her short term FMLA is over and she is trying to return to work soon. She notes much fatigue in the three week period that she was on antibiotics.  Lab results today 08/29/2020 of CBC w/diff and CMP is as follows: all values are WNL except for RDW of 16.9, Glucose of 134, BUN of 24, Total protein of 6.2, AST of 13.  On review of systems, pt reports leg pain and denies fevers, chills, night sweats, infection issues, difficulty sleeping, decreased appetite, mouth sores, new bone pains, leg swelling, neuropathy, abdominal pain, and any other symptoms.  MEDICAL HISTORY:  Past Medical History:  Diagnosis Date  . Cancer ([place])   . GERD (gastroesophageal reflux disease)   . History of kidney stones   . HTN (hypertension)   . Renal disorder     SURGICAL HISTORY: Past Surgical History:  Procedure Laterality Date  . CHOLECYSTECTOMY    . COLONOSCOPY  01/2015   Dr. Britta Mccreedy: Mild diverticulosis, sessile polyp ranging 3 to 5 mm removed from the proximal transverse colon, semi-pedunculated polyp 5 to 9 mm in size removed from the sigmoid colon.  Sigmoid colon polyp was serrated adenoma, transverse colon polyp was adenomatous.  Patient was told to have another colonoscopy in 3 years.  . COLONOSCOPY WITH PROPOFOL N/A 10/11/2017   Procedure: COLONOSCOPY WITH  PROPOFOL;  Surgeon: Daneil Dolin, MD;  [facility];  Service: Endoscopy;  Laterality: N/A;  1:15pm  . IR IMAGING GUIDED PORT INSERTION  06/05/2020  . POLYPECTOMY  10/11/2017   Procedure: POLYPECTOMY;  Surgeon: Daneil Dolin, MD;  Location: [facility];  Service: Endoscopy;;  descending colon polyps cs times 2    SOCIAL HISTORY: Social History   Socioeconomic History  . Marital status: Married    Spouse name: Not on file  . Number of children: Not on file  . Years of education: Not on file  . Highest education level: Not on file  Occupational History  . Not on file  Tobacco Use  . Smoking status: Former Smoker    Packs/day: 1.00    Years: 28.00    Pack years: 28.00    Types: Cigarettes    Quit date: 10/08/2013    Years since quitting: 6.8  . Smokeless tobacco: Never Used  Vaping Use  . Vaping Use: Never used  Substance and Sexual Activity  . Alcohol use: Not Currently  . Drug use: Never  . Sexual activity: Yes    Birth control/protection: Post-menopausal  Other Topics Concern  . Not on file  Social History Narrative  . Not on file   Social Determinants of Health   Financial Resource Strain: Low Risk   . Difficulty of Paying Living Expenses: Not hard at all  Food Insecurity: No Food Insecurity  . Worried About Charity fundraiser in the Last Year: Never true  . Ran Out of Food in the Last Year: Never true  Transportation Needs: No Transportation Needs  . Lack of Transportation (Medical): No  . Lack of Transportation (Non-Medical): No  Physical Activity: Not on file  Stress: No Stress Concern Present  . Feeling of Stress : Only a little  Social Connections: Unknown  . Frequency of Communication with Friends and Family: More than three times a week  . Frequency of Social Gatherings with Friends and Family: More than three times a week  . Attends Religious Services: Not on file  . Active Member of Clubs or Organizations: Not on file  . Attends Theatre manager Meetings: Not on file  . Marital Status: Married  Human resources officer Violence: Not on file    FAMILY HISTORY: Family History  Problem Relation Age of Onset  . Heart disease Mother   . Diabetes Mother   . Lung cancer Father   . COPD Father   . Colon cancer Neg Hx     ALLERGIES:  has No Known Allergies.  MEDICATIONS:  Current Outpatient Medications  Medication Sig Dispense Refill  . acetaminophen (TYLENOL) 500 MG tablet Take 1,000 mg by mouth every 6 (six) hours as needed for fever or headache.    Marland Kitchen acyclovir (ZOVIRAX) 400 MG tablet TAKE 1 TABLET BY MOUTH TWICE A DAY 30 tablet 2  . apixaban (ELIQUIS) 2.5 MG TABS tablet Take 1 tablet (2.5 mg total) by mouth 2 (two) times daily. 60 tablet  5  . Ascorbic Acid (VITAMIN C) 1000 MG tablet Take 1,000 mg by mouth every morning.    Marland Kitchen b complex vitamins capsule Take 1 capsule by mouth daily. (Patient taking differently: Take 1 capsule by mouth every morning.) 30 capsule 3  . Cholecalciferol (VITAMIN D3) 5000 units CAPS Take 5,000 Units by mouth every morning.    . Cyanocobalamin (VITAMIN B12) 3000 MCG SUBL Place 3,000 mcg under the tongue every morning.    Marland Kitchen dexamethasone (DECADRON) 4 MG tablet Take 10 tablets (42m) once on day 22. Repeat every 28 days for the first 4 cycles. Take with breakfast. (Patient taking differently: Take 40 mg by mouth See admin instructions. Take 10 tablets (466m once on day 22 after start of chemo.  Repeat every 28 days for the first 4 cycles. Take with breakfast.) 40 tablet 4  . lidocaine-prilocaine (EMLA) cream Apply to affected area once (Patient taking differently: Apply 1 application topically once as needed (prior to port access).) 30 g 3  . loratadine (CLARITIN) 10 MG tablet Take 10 mg by mouth every morning.    . Marland KitchenORazepam (ATIVAN) 0.5 MG tablet Take 1 tablet (0.5 mg total) by mouth every 6 (six) hours as needed (Nausea or vomiting). (Patient not taking: Reported on 07/04/2020) 30 tablet 0  .  omeprazole (PRILOSEC) 40 MG capsule Take 40 mg by mouth daily.    . ondansetron (ZOFRAN) 8 MG tablet Take 1 tablet (8 mg total) by mouth 2 (two) times daily as needed (Nausea or vomiting). 30 tablet 1  . prochlorperazine (COMPAZINE) 10 MG tablet Take 1 tablet (10 mg total) by mouth every 6 (six) hours as needed (Nausea or vomiting). 30 tablet 1  . REVLIMID 15 MG capsule TAKE 1 CAPSULE BY MOUTH ONCE DAILY AT BEDTIME FOR 21 DAYS ON AND 7 DAYS OFF 21 capsule 0   No current facility-administered medications for this visit.   Facility-Administered Medications Ordered in Other Visits  Medication Dose Route Frequency Provider Last Rate Last Admin  . carfilzomib (KYPROLIS) 60 mg in dextrose 5 % 100 mL chemo infusion  36 mg/m2 (Treatment Plan Recorded) Intravenous Once KaBrunetta GeneraMD      . dexamethasone (DECADRON) 20 mg in sodium chloride 0.9 % 50 mL IVPB  20 mg Intravenous Once KaBrunetta GeneraMD 208 mL/hr at 08/29/20 0951 20 mg at 08/29/20 0951  . heparin lock flush 100 unit/mL  500 Units Intracatheter Once PRN KaBrunetta GeneraMD      . sodium chloride flush (NS) 0.9 % injection 10 mL  10 mL Intracatheter PRN KaBrunetta GeneraMD        REVIEW OF SYSTEMS:   10 Point review of Systems was done is negative except as noted above.  PHYSICAL EXAMINATION: ECOG PERFORMANCE STATUS: 1 - Symptomatic but completely ambulatory  Exam was given in infusion.   GENERAL:alert, in no acute distress and comfortable SKIN: no significant lesions. Stitch granuloma around Port incision site. EYES: conjunctiva are pink and non-injected, sclera anicteric OROPHARYNX: MMM, no exudates, no oropharyngeal erythema or ulceration NECK: supple, no JVD LYMPH:  no palpable lymphadenopathy in the cervical, axillary or inguinal regions LUNGS: clear to auscultation b/l with normal respiratory effort HEART: regular rate & rhythm ABDOMEN:  normoactive bowel sounds , non tender, not distended. Extremity:  no pedal edema PSYCH: alert & oriented x 3 with fluent speech NEURO: no focal motor/sensory deficits   LABORATORY DATA:  I have reviewed the data as listed  .  CBC Latest Ref Rng & Units 08/29/2020 08/15/2020 08/08/2020  WBC 4.0 - 10.5 K/uL 7.3 12.1(H) 11.2(H)  Hemoglobin 12.0 - 15.0 g/dL 12.7 11.6(L) 12.2  Hematocrit 36.0 - 46.0 % 39.3 35.9(L) 37.7  Platelets 150 - 400 K/uL 277 188 190    . CMP Latest Ref Rng & Units 08/29/2020 08/15/2020 08/08/2020  Glucose 70 - 99 mg/dL 134(H) 117(H) 124(H)  BUN 6 - 20 mg/dL 24(H) 28(H) 19  Creatinine 0.44 - 1.00 mg/dL 0.94 0.84 0.81  Sodium 135 - 145 mmol/L 140 140 140  Potassium 3.5 - 5.1 mmol/L 3.8 4.1 3.7  Chloride 98 - 111 mmol/L 105 104 103  CO2 22 - 32 mmol/L _0 Calcium 8.9 - 10.3 mg/dL 8.9 8.8(L) 9.1  Total Protein 6.5 - 8.1 g/dL 6.2(L) 6.2(L) 6.6  Total Bilirubin 0.3 - 1.2 mg/dL 0.6 0.5 0.6  Alkaline Phos 38 - 126 U/L 64 71 76  AST 15 - 41 U/L 13(L) 16 16  ALT 0 - 44 U/L _1 Most recent lab results (03/15/2020) of CBC is as follows: all values are WNL except for RBC at 3.66, Hgb at 9.7, HCT at 31.8, MCHC at 30.5, RDW Standard Deviation 58.7, Red Cell Distribution Width At 18.6,  BUN at 19, Creatinine at 1.06, Total Protein at 10.2, Albumin at 2.7, AST at 14,. 03/13/2020 UPEP shows no M-Spike, all values are WNL 03/06/2020 SPEP shows Total Protein at 10.1, Beta Globulin at 5.2, M-Spike at 4.3, Total Globulin at 6.4, A/G Ratio at 0.6 03/06/2020 Free Kappa Light Chains at 181.1, Free Lambda Light Chains Serum at 3.6, K/L ratio at 50.31 03/06/2020 Beta-2 Microglobulin at 2.5           RADIOGRAPHIC STUDIES: I have personally reviewed the radiological images as listed and agreed with the findings in the report. No results found.  ASSESSMENT & PLAN:   60 year old female with  #1 Newly diagnosed multiple myeloma -IgA kappa.  Presenting with anemia hemoglobin today 9.4. Creatinine 1.28 No hypercalcemia Bone survey  with no obvious skeletal lesions. Baseline M spike of 4.3 g/dL. Beta-2 Microglobulin at 2.5 Mol Cy Monosomy 13 and dup 1q   PLAN: -Discussed pt labwork today, 08/29/2020; blood counts normal, chemistries stable. -Advised pt her last myeloma panel showed a m-protein of 0.4. She is now in VGPR as her original was 4.7. -Discussed referral for transplant.Advised pt she is younger and does have higher risk genetics so we would recommend this referral.  -Advised pt that blood sugars of 134 is not concerning at this time, especially given steroids. -Recommended again pt receive the COVID vaccinations. The pt is declining COVID-19 vaccinations again despite unequivocal recommendations to receive this despite her high risk of complications from [place]. -Continue masking and aggressive COVID prevention strategies. -Discussed Evusheld again and recommended pt receive this, especially given immunosuppression and not vaccinated. Will send referral. -Advised pt that given her recent infection issues, we will not increase the Revlimid to 25 mg at this time. Will keep dosage at 15 mg. -Recommended pt discuss transplant center option with husband and insurance to see who is preferred and in network. The pt will get back to [place] so we can send referral. -Discussed autologous transplants and standard procedure.  -Recommended pt stay physically active, drink 48-64 oz water daily, and eat healthy. Make sure to get rest. -Will keep Carfilzomib dose at 36 mg/m^2. This is the full dosage and the pt is tolerating this well. -The  pt has no prohibitive toxicities from continuing C4D1 Carfilzomib / Revlimid. -Continue 2.5 mg Eliquis BID. -Will see back in 5 weeks with labs.     FOLLOW UP: plz schedule C5 and C6 of treatment as per orders. portflush and Labs on D1,D8 and D15 of treatment Next MD visit with C5D8 of treatment Referral for Evusheld    All of the patients questions were answered with apparent  satisfaction. The patient knows to call the clinic with any problems, questions or concerns.     The total time spent in the appointment was 30 minutes and more than 50% was on counseling and direct patient cares.     Sullivan Lone MD [place] AAHIVMS [facility] Hematology/Oncology Physician [facility]  (Office):       [phone] (Work cell):  [phone] (Fax):           [phone]  08/29/2020 10:02 AM  I, Reinaldo Raddle, am acting as scribe for Dr. Sullivan Lone, MD. .I have reviewed the above documentation for accuracy and completeness, and I agree with the above. Brunetta Genera MD

## 2020-08-29 ENCOUNTER — Inpatient Hospital Stay: Payer: BC Managed Care – PPO

## 2020-08-29 ENCOUNTER — Other Ambulatory Visit: Payer: Self-pay

## 2020-08-29 ENCOUNTER — Inpatient Hospital Stay (HOSPITAL_BASED_OUTPATIENT_CLINIC_OR_DEPARTMENT_OTHER): Payer: BC Managed Care – PPO | Admitting: Hematology

## 2020-08-29 VITALS — BP 134/73 | HR 77 | Temp 98.0°F | Resp 16 | Wt 136.2 lb

## 2020-08-29 DIAGNOSIS — Z7189 Other specified counseling: Secondary | ICD-10-CM

## 2020-08-29 DIAGNOSIS — C9000 Multiple myeloma not having achieved remission: Secondary | ICD-10-CM

## 2020-08-29 DIAGNOSIS — Z5112 Encounter for antineoplastic immunotherapy: Secondary | ICD-10-CM | POA: Diagnosis not present

## 2020-08-29 DIAGNOSIS — Z95828 Presence of other vascular implants and grafts: Secondary | ICD-10-CM

## 2020-08-29 LAB — CBC WITH DIFFERENTIAL/PLATELET
Abs Immature Granulocytes: 0.06 10*3/uL (ref 0.00–0.07)
Basophils Absolute: 0.1 10*3/uL (ref 0.0–0.1)
Basophils Relative: 2 %
Eosinophils Absolute: 0.2 10*3/uL (ref 0.0–0.5)
Eosinophils Relative: 3 %
HCT: 39.3 % (ref 36.0–46.0)
Hemoglobin: 12.7 g/dL (ref 12.0–15.0)
Immature Granulocytes: 1 %
Lymphocytes Relative: 29 %
Lymphs Abs: 2.1 10*3/uL (ref 0.7–4.0)
MCH: 30.1 pg (ref 26.0–34.0)
MCHC: 32.3 g/dL (ref 30.0–36.0)
MCV: 93.1 fL (ref 80.0–100.0)
Monocytes Absolute: 0.8 10*3/uL (ref 0.1–1.0)
Monocytes Relative: 11 %
Neutro Abs: 4.1 10*3/uL (ref 1.7–7.7)
Neutrophils Relative %: 54 %
Platelets: 277 10*3/uL (ref 150–400)
RBC: 4.22 MIL/uL (ref 3.87–5.11)
RDW: 16.9 % — ABNORMAL HIGH (ref 11.5–15.5)
WBC: 7.3 10*3/uL (ref 4.0–10.5)
nRBC: 0 % (ref 0.0–0.2)

## 2020-08-29 LAB — CMP (CANCER CENTER ONLY)
ALT: 12 U/L (ref 0–44)
AST: 13 U/L — ABNORMAL LOW (ref 15–41)
Albumin: 3.7 g/dL (ref 3.5–5.0)
Alkaline Phosphatase: 64 U/L (ref 38–126)
Anion gap: 13 (ref 5–15)
BUN: 24 mg/dL — ABNORMAL HIGH (ref 6–20)
CO2: 22 mmol/L (ref 22–32)
Calcium: 8.9 mg/dL (ref 8.9–10.3)
Chloride: 105 mmol/L (ref 98–111)
Creatinine: 0.94 mg/dL (ref 0.44–1.00)
GFR, Estimated: >60 mL/min (ref >60)
Glucose, Bld: 134 mg/dL — ABNORMAL HIGH (ref 70–99)
Potassium: 3.8 mmol/L (ref 3.5–5.1)
Sodium: 140 mmol/L (ref 135–145)
Total Bilirubin: 0.6 mg/dL (ref 0.3–1.2)
Total Protein: 6.2 g/dL — ABNORMAL LOW (ref 6.5–8.1)

## 2020-08-29 MED ORDER — SODIUM CHLORIDE 0.9 % IV SOLN
Freq: Once | INTRAVENOUS | Status: AC
  Filled 2020-08-29: qty 250

## 2020-08-29 MED ORDER — FAMOTIDINE 20 MG PO TABS
ORAL_TABLET | ORAL | Status: AC
  Filled 2020-08-29: qty 1

## 2020-08-29 MED ORDER — ACETAMINOPHEN 500 MG PO TABS
ORAL_TABLET | ORAL | Status: AC
  Filled 2020-08-29: qty 2

## 2020-08-29 MED ORDER — HEPARIN SOD (PORK) LOCK FLUSH 100 UNIT/ML IV SOLN
500.0000 [IU] | Freq: Once | INTRAVENOUS | Status: AC | PRN
  Administered 2020-08-29: 500 [IU]
  Filled 2020-08-29: qty 5

## 2020-08-29 MED ORDER — FAMOTIDINE 20 MG PO TABS
20.0000 mg | ORAL_TABLET | Freq: Once | ORAL | Status: AC
  Administered 2020-08-29: 20 mg via ORAL

## 2020-08-29 MED ORDER — SODIUM CHLORIDE 0.9% FLUSH
10.0000 mL | INTRAVENOUS | Status: DC | PRN
  Administered 2020-08-29: 10 mL
  Filled 2020-08-29: qty 10

## 2020-08-29 MED ORDER — SODIUM CHLORIDE 0.9% FLUSH
10.0000 mL | Freq: Once | INTRAVENOUS | Status: AC
Start: 2020-08-29 — End: 2020-08-29
  Administered 2020-08-29: 10 mL
  Filled 2020-08-29: qty 10

## 2020-08-29 MED ORDER — PALONOSETRON HCL INJECTION 0.25 MG/5ML
INTRAVENOUS | Status: AC
  Filled 2020-08-29: qty 5

## 2020-08-29 MED ORDER — DIPHENHYDRAMINE HCL 25 MG PO CAPS
25.0000 mg | ORAL_CAPSULE | Freq: Once | ORAL | Status: AC
  Administered 2020-08-29: 25 mg via ORAL

## 2020-08-29 MED ORDER — DEXTROSE 5 % IV SOLN
36.0000 mg/m2 | Freq: Once | INTRAVENOUS | Status: AC
  Administered 2020-08-29: 60 mg via INTRAVENOUS
  Filled 2020-08-29: qty 30

## 2020-08-29 MED ORDER — ACETAMINOPHEN 325 MG PO TABS
ORAL_TABLET | ORAL | Status: AC
  Filled 2020-08-29: qty 2

## 2020-08-29 MED ORDER — SODIUM CHLORIDE 0.9 % IV SOLN
20.0000 mg | Freq: Once | INTRAVENOUS | Status: AC
  Administered 2020-08-29: 20 mg via INTRAVENOUS
  Filled 2020-08-29: qty 20

## 2020-08-29 MED ORDER — ACETAMINOPHEN 500 MG PO TABS
1000.0000 mg | ORAL_TABLET | Freq: Once | ORAL | Status: AC
  Administered 2020-08-29: 1000 mg via ORAL

## 2020-08-29 MED ORDER — DIPHENHYDRAMINE HCL 25 MG PO CAPS
ORAL_CAPSULE | ORAL | Status: AC
  Filled 2020-08-29: qty 1

## 2020-08-29 NOTE — Patient Instructions (Signed)
[facility] MEDICAL [facility]COLOGY  Discharge Instructions: Thank you for choosing [facility] to provide your oncology and hematology care.   If you have a lab appointment with the Cancer Center, please go directly to the Cancer Center and check in at the registration area.   Wear comfortable clothing and clothing appropriate for easy access to any Portacath or PICC line.   We strive to give you quality time with your provider. You may need to reschedule your appointment if you arrive late (15 or more minutes).  Arriving late affects you and other patients whose appointments are after yours.  Also, if you miss three or more appointments without notifying the office, you may be dismissed from the clinic at the provider's discretion.      For prescription refill requests, have your pharmacy contact our office and allow 72 hours for refills to be completed.    Today you received the following chemotherapy and/or immunotherapy agents carfilzomib   To help prevent nausea and vomiting after your treatment, we encourage you to take your nausea medication as directed.  BELOW ARE SYMPTOMS THAT SHOULD BE REPORTED IMMEDIATELY: . *FEVER GREATER THAN 100.4 F (38 C) OR HIGHER . *CHILLS OR SWEATING . *NAUSEA AND VOMITING THAT IS NOT CONTROLLED WITH YOUR NAUSEA MEDICATION . *UNUSUAL SHORTNESS OF BREATH . *UNUSUAL BRUISING OR BLEEDING . *URINARY PROBLEMS (pain or burning when urinating, or frequent urination) . *BOWEL PROBLEMS (unusual diarrhea, constipation, pain near the anus) . TENDERNESS IN MOUTH AND THROAT WITH OR WITHOUT PRESENCE OF ULCERS (sore throat, sores in mouth, or a toothache) . UNUSUAL RASH, SWELLING OR PAIN  . UNUSUAL VAGINAL DISCHARGE OR ITCHING   Items with * indicate a potential emergency and should be followed up as soon as possible or go to the Emergency Department if any problems should occur.  Please show the CHEMOTHERAPY ALERT CARD or IMMUNOTHERAPY ALERT  CARD at check-in to the Emergency Department and triage nurse.  Should you have questions after your visit or need to cancel or reschedule your appointment, please contact [facility]COL[facility]  Dept: [phone]  and follow the prompts.  Office hours are 8:00 a.m. to 4:30 p.m. Monday - Friday. Please note that voicemails left after 4:00 p.m. may not be returned until the following business day.  We are closed weekends and major holidays. You have access to a nurse at all times for urgent questions. Please call the main number to the clinic Dept: [phone] and follow the prompts.   For any non-urgent questions, you may also contact your provider using MyChart. We now offer e-Visits for anyone 18 and older to request care online for non-urgent symptoms. For details visit [email or web address].   Also download the MyChart app! Go to the app store, search "MyChart", open the app, select , and log in with your MyChart username and password.  Due to Covid, a mask is required upon entering the hospital/clinic. If you do not have a mask, one will be given to you upon arrival. For doctor visits, patients may have 1 support person aged 18 or older with them. For treatment visits, patients cannot have anyone with them due to current Covid guidelines and our immunocompromised population.   

## 2020-08-30 ENCOUNTER — Other Ambulatory Visit: Payer: Self-pay | Admitting: Hematology

## 2020-08-30 ENCOUNTER — Other Ambulatory Visit: Payer: Self-pay

## 2020-08-30 ENCOUNTER — Telehealth: Payer: Self-pay | Admitting: *Deleted

## 2020-08-30 ENCOUNTER — Inpatient Hospital Stay: Payer: BC Managed Care – PPO

## 2020-08-30 VITALS — BP 145/78 | HR 91 | Temp 98.7°F | Resp 18

## 2020-08-30 DIAGNOSIS — C9000 Multiple myeloma not having achieved remission: Secondary | ICD-10-CM

## 2020-08-30 DIAGNOSIS — Z7189 Other specified counseling: Secondary | ICD-10-CM

## 2020-08-30 DIAGNOSIS — Z5112 Encounter for antineoplastic immunotherapy: Secondary | ICD-10-CM | POA: Diagnosis not present

## 2020-08-30 MED ORDER — SODIUM CHLORIDE 0.9 % IV SOLN
Freq: Once | INTRAVENOUS | Status: DC
  Filled 2020-08-30: qty 250

## 2020-08-30 MED ORDER — FAMOTIDINE 20 MG PO TABS
20.0000 mg | ORAL_TABLET | Freq: Once | ORAL | Status: AC
  Administered 2020-08-30: 20 mg via ORAL

## 2020-08-30 MED ORDER — SODIUM CHLORIDE 0.9 % IV SOLN
Freq: Once | INTRAVENOUS | Status: AC
  Filled 2020-08-30: qty 250

## 2020-08-30 MED ORDER — SODIUM CHLORIDE 0.9 % IV SOLN
20.0000 mg | Freq: Once | INTRAVENOUS | Status: AC
  Administered 2020-08-30: 20 mg via INTRAVENOUS
  Filled 2020-08-30: qty 20

## 2020-08-30 MED ORDER — DIPHENHYDRAMINE HCL 25 MG PO CAPS
25.0000 mg | ORAL_CAPSULE | Freq: Once | ORAL | Status: AC
  Administered 2020-08-30: 25 mg via ORAL

## 2020-08-30 MED ORDER — SODIUM CHLORIDE 0.9% FLUSH
10.0000 mL | INTRAVENOUS | Status: DC | PRN
  Administered 2020-08-30: 10 mL
  Filled 2020-08-30: qty 10

## 2020-08-30 MED ORDER — ACETAMINOPHEN 500 MG PO TABS
ORAL_TABLET | ORAL | Status: AC
  Filled 2020-08-30: qty 2

## 2020-08-30 MED ORDER — FAMOTIDINE 20 MG PO TABS
ORAL_TABLET | ORAL | Status: AC
  Filled 2020-08-30: qty 1

## 2020-08-30 MED ORDER — HEPARIN SOD (PORK) LOCK FLUSH 100 UNIT/ML IV SOLN
500.0000 [IU] | Freq: Once | INTRAVENOUS | Status: AC | PRN
  Administered 2020-08-30: 500 [IU]
  Filled 2020-08-30: qty 5

## 2020-08-30 MED ORDER — ACETAMINOPHEN 500 MG PO TABS
1000.0000 mg | ORAL_TABLET | Freq: Once | ORAL | Status: AC
  Administered 2020-08-30: 1000 mg via ORAL

## 2020-08-30 MED ORDER — DEXTROSE 5 % IV SOLN
36.0000 mg/m2 | Freq: Once | INTRAVENOUS | Status: AC
  Administered 2020-08-30: 60 mg via INTRAVENOUS
  Filled 2020-08-30: qty 30

## 2020-08-30 MED ORDER — DIPHENHYDRAMINE HCL 25 MG PO CAPS
ORAL_CAPSULE | ORAL | Status: AC
  Filled 2020-08-30: qty 1

## 2020-08-30 NOTE — Patient Instructions (Signed)
[facility]COL[facility]  Discharge Instructions: Thank you for choosing [facility] to provide your oncology and hematology care.   If you have a lab appointment with the Cancer Center, please go directly to the Cancer Center and check in at the registration area.   Wear comfortable clothing and clothing appropriate for easy access to any Portacath or PICC line.   We strive to give you quality time with your provider. You may need to reschedule your appointment if you arrive late (15 or more minutes).  Arriving late affects you and other patients whose appointments are after yours.  Also, if you miss three or more appointments without notifying the office, you may be dismissed from the clinic at the provider's discretion.      For prescription refill requests, have your pharmacy contact our office and allow 72 hours for refills to be completed.    Today you received the following chemotherapy and/or immunotherapy agents carfilzomib   To help prevent nausea and vomiting after your treatment, we encourage you to take your nausea medication as directed.  BELOW ARE SYMPTOMS THAT SHOULD BE REPORTED IMMEDIATELY: . *FEVER GREATER THAN 100.4 F (38 C) OR HIGHER . *CHILLS OR SWEATING . *NAUSEA AND VOMITING THAT IS NOT CONTROLLED WITH YOUR NAUSEA MEDICATION . *UNUSUAL SHORTNESS OF BREATH . *UNUSUAL BRUISING OR BLEEDING . *URINARY PROBLEMS (pain or burning when urinating, or frequent urination) . *BOWEL PROBLEMS (unusual diarrhea, constipation, pain near the anus) . TENDERNESS IN MOUTH AND THROAT WITH OR WITHOUT PRESENCE OF ULCERS (sore throat, sores in mouth, or a toothache) . UNUSUAL RASH, SWELLING OR PAIN  . UNUSUAL VAGINAL DISCHARGE OR ITCHING   Items with * indicate a potential emergency and should be followed up as soon as possible or go to the Emergency Department if any problems should occur.  Please show the CHEMOTHERAPY ALERT CARD or IMMUNOTHERAPY ALERT  CARD at check-in to the Emergency Department and triage nurse.  Should you have questions after your visit or need to cancel or reschedule your appointment, please contact [facility]COL[facility]  Dept: [phone]  and follow the prompts.  Office hours are 8:00 a.m. to 4:30 p.m. Monday - Friday. Please note that voicemails left after 4:00 p.m. may not be returned until the following business day.  We are closed weekends and major holidays. You have access to a nurse at all times for urgent questions. Please call the main number to the clinic Dept: [phone] and follow the prompts.   For any non-urgent questions, you may also contact your provider using MyChart. We now offer e-Visits for anyone 18 and older to request care online for non-urgent symptoms. For details visit [email or web address].   Also download the MyChart app! Go to the app store, search "MyChart", open the app, select [facility], and log in with your MyChart username and password.  Due to Covid, a mask is required upon entering the hospital/clinic. If you do not have a mask, one will be given to you upon arrival. For doctor visits, patients may have 1 support person aged 18 or older with them. For treatment visits, patients cannot have anyone with them due to current Covid guidelines and our immunocompromised population.   

## 2020-08-30 NOTE — Telephone Encounter (Signed)
Megan Velasquez states she would like a referral to [facility] for possible bone marrow transplant

## 2020-09-04 ENCOUNTER — Encounter: Payer: Self-pay | Admitting: Hematology

## 2020-09-05 ENCOUNTER — Other Ambulatory Visit: Payer: Self-pay

## 2020-09-05 ENCOUNTER — Inpatient Hospital Stay: Payer: BC Managed Care – PPO | Attending: Hematology

## 2020-09-05 ENCOUNTER — Inpatient Hospital Stay: Payer: BC Managed Care – PPO

## 2020-09-05 VITALS — BP 124/57 | HR 76 | Temp 97.9°F | Resp 18

## 2020-09-05 DIAGNOSIS — Z5112 Encounter for antineoplastic immunotherapy: Secondary | ICD-10-CM | POA: Insufficient documentation

## 2020-09-05 DIAGNOSIS — C9000 Multiple myeloma not having achieved remission: Secondary | ICD-10-CM | POA: Diagnosis present

## 2020-09-05 DIAGNOSIS — Z79899 Other long term (current) drug therapy: Secondary | ICD-10-CM | POA: Insufficient documentation

## 2020-09-05 DIAGNOSIS — Z95828 Presence of other vascular implants and grafts: Secondary | ICD-10-CM

## 2020-09-05 DIAGNOSIS — Z7189 Other specified counseling: Secondary | ICD-10-CM

## 2020-09-05 LAB — CMP (CANCER CENTER ONLY)
ALT: 9 U/L (ref 0–44)
AST: 13 U/L — ABNORMAL LOW (ref 15–41)
Albumin: 3.7 g/dL (ref 3.5–5.0)
Alkaline Phosphatase: 77 U/L (ref 38–126)
Anion gap: 12 (ref 5–15)
BUN: 26 mg/dL — ABNORMAL HIGH (ref 6–20)
CO2: 23 mmol/L (ref 22–32)
Calcium: 8.9 mg/dL (ref 8.9–10.3)
Chloride: 104 mmol/L (ref 98–111)
Creatinine: 0.79 mg/dL (ref 0.44–1.00)
GFR, Estimated: >60 mL/min (ref >60)
Glucose, Bld: 134 mg/dL — ABNORMAL HIGH (ref 70–99)
Potassium: 4.3 mmol/L (ref 3.5–5.1)
Sodium: 139 mmol/L (ref 135–145)
Total Bilirubin: 0.5 mg/dL (ref 0.3–1.2)
Total Protein: 6 g/dL — ABNORMAL LOW (ref 6.5–8.1)

## 2020-09-05 LAB — CBC WITH DIFFERENTIAL/PLATELET
Abs Immature Granulocytes: 0.44 10*3/uL — ABNORMAL HIGH (ref 0.00–0.07)
Basophils Absolute: 0.1 10*3/uL (ref 0.0–0.1)
Basophils Relative: 1 %
Eosinophils Absolute: 0.3 10*3/uL (ref 0.0–0.5)
Eosinophils Relative: 4 %
HCT: 37.3 % (ref 36.0–46.0)
Hemoglobin: 12.1 g/dL (ref 12.0–15.0)
Immature Granulocytes: 6 %
Lymphocytes Relative: 22 %
Lymphs Abs: 1.7 10*3/uL (ref 0.7–4.0)
MCH: 30.3 pg (ref 26.0–34.0)
MCHC: 32.4 g/dL (ref 30.0–36.0)
MCV: 93.5 fL (ref 80.0–100.0)
Monocytes Absolute: 0.6 10*3/uL (ref 0.1–1.0)
Monocytes Relative: 8 %
Neutro Abs: 4.8 10*3/uL (ref 1.7–7.7)
Neutrophils Relative %: 59 %
Platelets: 143 10*3/uL — ABNORMAL LOW (ref 150–400)
RBC: 3.99 MIL/uL (ref 3.87–5.11)
RDW: 16.6 % — ABNORMAL HIGH (ref 11.5–15.5)
WBC: 8 10*3/uL (ref 4.0–10.5)
nRBC: 0 % (ref 0.0–0.2)

## 2020-09-05 MED ORDER — ACETAMINOPHEN 500 MG PO TABS
ORAL_TABLET | ORAL | Status: AC
  Filled 2020-09-05: qty 2

## 2020-09-05 MED ORDER — FAMOTIDINE 20 MG PO TABS
ORAL_TABLET | ORAL | Status: AC
  Filled 2020-09-05: qty 1

## 2020-09-05 MED ORDER — SODIUM CHLORIDE 0.9% FLUSH
10.0000 mL | INTRAVENOUS | Status: DC | PRN
Start: 2020-09-05 — End: 2020-09-05
  Administered 2020-09-05: 10 mL
  Filled 2020-09-05: qty 10

## 2020-09-05 MED ORDER — FAMOTIDINE 20 MG PO TABS
20.0000 mg | ORAL_TABLET | Freq: Once | ORAL | Status: AC
  Administered 2020-09-05: 20 mg via ORAL

## 2020-09-05 MED ORDER — SODIUM CHLORIDE 0.9 % IV SOLN
20.0000 mg | Freq: Once | INTRAVENOUS | Status: AC
  Administered 2020-09-05: 20 mg via INTRAVENOUS
  Filled 2020-09-05: qty 20

## 2020-09-05 MED ORDER — DIPHENHYDRAMINE HCL 25 MG PO CAPS
ORAL_CAPSULE | ORAL | Status: AC
  Filled 2020-09-05: qty 1

## 2020-09-05 MED ORDER — DEXTROSE 5 % IV SOLN
36.0000 mg/m2 | Freq: Once | INTRAVENOUS | Status: AC
  Administered 2020-09-05: 60 mg via INTRAVENOUS
  Filled 2020-09-05: qty 30

## 2020-09-05 MED ORDER — SODIUM CHLORIDE 0.9% FLUSH
10.0000 mL | Freq: Once | INTRAVENOUS | Status: AC
  Administered 2020-09-05: 10 mL
  Filled 2020-09-05: qty 10

## 2020-09-05 MED ORDER — SODIUM CHLORIDE 0.9 % IV SOLN
Freq: Once | INTRAVENOUS | Status: AC
  Filled 2020-09-05: qty 250

## 2020-09-05 MED ORDER — HEPARIN SOD (PORK) LOCK FLUSH 100 UNIT/ML IV SOLN
500.0000 [IU] | Freq: Once | INTRAVENOUS | Status: AC | PRN
  Administered 2020-09-05: 500 [IU]
  Filled 2020-09-05: qty 5

## 2020-09-05 MED ORDER — ACETAMINOPHEN 500 MG PO TABS
1000.0000 mg | ORAL_TABLET | Freq: Once | ORAL | Status: AC
  Administered 2020-09-05: 1000 mg via ORAL

## 2020-09-05 MED ORDER — DIPHENHYDRAMINE HCL 25 MG PO CAPS
25.0000 mg | ORAL_CAPSULE | Freq: Once | ORAL | Status: AC
  Administered 2020-09-05: 25 mg via ORAL

## 2020-09-05 NOTE — Progress Notes (Signed)
Referral faxed to Dr Levada [facility]) Fax: [phone]

## 2020-09-05 NOTE — Patient Instructions (Signed)
[facility]  Discharge Instructions: Thank you for choosing [facility] to provide your oncology and hematology care.   If you have a lab appointment with the Cancer Center, please go directly to the Cancer Center and check in at the registration area.   Wear comfortable clothing and clothing appropriate for easy access to any Portacath or PICC line.   We strive to give you quality time with your provider. You may need to reschedule your appointment if you arrive late (15 or more minutes).  Arriving late affects you and other patients whose appointments are after yours.  Also, if you miss three or more appointments without notifying the office, you may be dismissed from the clinic at the provider's discretion.      For prescription refill requests, have your pharmacy contact our office and allow 72 hours for refills to be completed.    Today you received the following chemotherapy and/or immunotherapy agents kyprolis      To help prevent nausea and vomiting after your treatment, we encourage you to take your nausea medication as directed.  BELOW ARE SYMPTOMS THAT SHOULD BE REPORTED IMMEDIATELY: . *FEVER GREATER THAN 100.4 F (38 C) OR HIGHER . *CHILLS OR SWEATING . *NAUSEA AND VOMITING THAT IS NOT CONTROLLED WITH YOUR NAUSEA MEDICATION . *UNUSUAL SHORTNESS OF BREATH . *UNUSUAL BRUISING OR BLEEDING . *URINARY PROBLEMS (pain or burning when urinating, or frequent urination) . *BOWEL PROBLEMS (unusual diarrhea, constipation, pain near the anus) . TENDERNESS IN MOUTH AND THROAT WITH OR WITHOUT PRESENCE OF ULCERS (sore throat, sores in mouth, or a toothache) . UNUSUAL RASH, SWELLING OR PAIN  . UNUSUAL VAGINAL DISCHARGE OR ITCHING   Items with * indicate a potential emergency and should be followed up as soon as possible or go to the Emergency Department if any problems should occur.  Please show the CHEMOTHERAPY ALERT CARD or IMMUNOTHERAPY ALERT  CARD at check-in to the Emergency Department and triage nurse.  Should you have questions after your visit or need to cancel or reschedule your appointment, please contact [facility]  Dept: [phone]  and follow the prompts.  Office hours are 8:00 a.m. to 4:30 p.m. Monday - Friday. Please note that voicemails left after 4:00 p.m. may not be returned until the following business day.  We are closed weekends and major holidays. You have access to a nurse at all times for urgent questions. Please call the main number to the clinic Dept: [phone] and follow the prompts.   For any non-urgent questions, you may also contact your provider using MyChart. We now offer e-Visits for anyone 18 and older to request care online for non-urgent symptoms. For details visit [email or web address].   Also download the MyChart app! Go to the app store, search "MyChart", open the app, select , and log in with your MyChart username and password.  Due to Covid, a mask is required upon entering the hospital/clinic. If you do not have a mask, one will be given to you upon arrival. For doctor visits, patients may have 1 support person aged 18 or older with them. For treatment visits, patients cannot have anyone with them due to current Covid guidelines and our immunocompromised population.   

## 2020-09-06 ENCOUNTER — Inpatient Hospital Stay: Payer: BC Managed Care – PPO

## 2020-09-06 VITALS — BP 132/72 | HR 73 | Temp 97.9°F | Resp 17

## 2020-09-06 DIAGNOSIS — Z5112 Encounter for antineoplastic immunotherapy: Secondary | ICD-10-CM | POA: Diagnosis not present

## 2020-09-06 DIAGNOSIS — Z7189 Other specified counseling: Secondary | ICD-10-CM

## 2020-09-06 DIAGNOSIS — C9000 Multiple myeloma not having achieved remission: Secondary | ICD-10-CM

## 2020-09-06 MED ORDER — ACETAMINOPHEN 500 MG PO TABS
ORAL_TABLET | ORAL | Status: AC
  Filled 2020-09-06: qty 2

## 2020-09-06 MED ORDER — HEPARIN SOD (PORK) LOCK FLUSH 100 UNIT/ML IV SOLN
500.0000 [IU] | Freq: Once | INTRAVENOUS | Status: AC | PRN
Start: 2020-09-06 — End: 2020-09-06
  Administered 2020-09-06: 500 [IU]
  Filled 2020-09-06: qty 5

## 2020-09-06 MED ORDER — DEXTROSE 5 % IV SOLN
36.0000 mg/m2 | Freq: Once | INTRAVENOUS | Status: AC
  Administered 2020-09-06: 60 mg via INTRAVENOUS
  Filled 2020-09-06: qty 30

## 2020-09-06 MED ORDER — DIPHENHYDRAMINE HCL 25 MG PO CAPS
ORAL_CAPSULE | ORAL | Status: AC
  Filled 2020-09-06: qty 1

## 2020-09-06 MED ORDER — SODIUM CHLORIDE 0.9 % IV SOLN
Freq: Once | INTRAVENOUS | Status: AC
  Filled 2020-09-06: qty 250

## 2020-09-06 MED ORDER — DEXAMETHASONE SODIUM PHOSPHATE 100 MG/10ML IJ SOLN
20.0000 mg | Freq: Once | INTRAMUSCULAR | Status: AC
  Administered 2020-09-06: 20 mg via INTRAVENOUS
  Filled 2020-09-06: qty 20

## 2020-09-06 MED ORDER — FAMOTIDINE 20 MG PO TABS
ORAL_TABLET | ORAL | Status: AC
  Filled 2020-09-06: qty 1

## 2020-09-06 MED ORDER — SODIUM CHLORIDE 0.9% FLUSH
10.0000 mL | INTRAVENOUS | Status: DC | PRN
  Administered 2020-09-06: 10 mL
  Filled 2020-09-06: qty 10

## 2020-09-06 MED ORDER — FAMOTIDINE 20 MG PO TABS
20.0000 mg | ORAL_TABLET | Freq: Once | ORAL | Status: AC
  Administered 2020-09-06: 20 mg via ORAL

## 2020-09-06 MED ORDER — ACETAMINOPHEN 500 MG PO TABS
1000.0000 mg | ORAL_TABLET | Freq: Once | ORAL | Status: AC
  Administered 2020-09-06: 1000 mg via ORAL

## 2020-09-06 MED ORDER — DIPHENHYDRAMINE HCL 25 MG PO CAPS
25.0000 mg | ORAL_CAPSULE | Freq: Once | ORAL | Status: AC
  Administered 2020-09-06: 25 mg via ORAL

## 2020-09-06 NOTE — Patient Instructions (Signed)
[facility]  Discharge Instructions: Thank you for choosing [facility] to provide your oncology and hematology care.   If you have a lab appointment with the Cancer Center, please go directly to the Cancer Center and check in at the registration area.   Wear comfortable clothing and clothing appropriate for easy access to any Portacath or PICC line.   We strive to give you quality time with your provider. You may need to reschedule your appointment if you arrive late (15 or more minutes).  Arriving late affects you and other patients whose appointments are after yours.  Also, if you miss three or more appointments without notifying the office, you may be dismissed from the clinic at the provider's discretion.      For prescription refill requests, have your pharmacy contact our office and allow 72 hours for refills to be completed.    Today you received the following chemotherapy and/or immunotherapy agents: Kyprolis    To help prevent nausea and vomiting after your treatment, we encourage you to take your nausea medication as directed.  BELOW ARE SYMPTOMS THAT SHOULD BE REPORTED IMMEDIATELY: . *FEVER GREATER THAN 100.4 F (38 C) OR HIGHER . *CHILLS OR SWEATING . *NAUSEA AND VOMITING THAT IS NOT CONTROLLED WITH YOUR NAUSEA MEDICATION . *UNUSUAL SHORTNESS OF BREATH . *UNUSUAL BRUISING OR BLEEDING . *URINARY PROBLEMS (pain or burning when urinating, or frequent urination) . *BOWEL PROBLEMS (unusual diarrhea, constipation, pain near the anus) . TENDERNESS IN MOUTH AND THROAT WITH OR WITHOUT PRESENCE OF ULCERS (sore throat, sores in mouth, or a toothache) . UNUSUAL RASH, SWELLING OR PAIN  . UNUSUAL VAGINAL DISCHARGE OR ITCHING   Items with * indicate a potential emergency and should be followed up as soon as possible or go to the Emergency Department if any problems should occur.  Please show the CHEMOTHERAPY ALERT CARD or IMMUNOTHERAPY ALERT  CARD at check-in to the Emergency Department and triage nurse.  Should you have questions after your visit or need to cancel or reschedule your appointment, please contact [facility]  Dept: [phone]  and follow the prompts.  Office hours are 8:00 a.m. to 4:30 p.m. Monday - Friday. Please note that voicemails left after 4:00 p.m. may not be returned until the following business day.  We are closed weekends and major holidays. You have access to a nurse at all times for urgent questions. Please call the main number to the clinic Dept: [phone] and follow the prompts.   For any non-urgent questions, you may also contact your provider using MyChart. We now offer e-Visits for anyone 18 and older to request care online for non-urgent symptoms. For details visit [email or web address].   Also download the MyChart app! Go to the app store, search "MyChart", open the app, select [facility], and log in with your MyChart username and password.  Due to Covid, a mask is required upon entering the hospital/clinic. If you do not have a mask, one will be given to you upon arrival. For doctor visits, patients may have 1 support person aged 18 or older with them. For treatment visits, patients cannot have anyone with them due to current Covid guidelines and our immunocompromised population.   

## 2020-09-09 ENCOUNTER — Telehealth: Payer: Self-pay | Admitting: Hematology

## 2020-09-09 NOTE — Telephone Encounter (Signed)
Scheduled per 05/26 los, patient has been called and notified of upcoming appointments.

## 2020-09-12 ENCOUNTER — Inpatient Hospital Stay: Payer: BC Managed Care – PPO

## 2020-09-12 ENCOUNTER — Other Ambulatory Visit: Payer: Self-pay

## 2020-09-12 ENCOUNTER — Telehealth: Payer: Self-pay | Admitting: Hematology

## 2020-09-12 VITALS — BP 143/71 | HR 91 | Temp 97.7°F | Resp 16 | Wt 137.8 lb

## 2020-09-12 DIAGNOSIS — Z7189 Other specified counseling: Secondary | ICD-10-CM

## 2020-09-12 DIAGNOSIS — C9000 Multiple myeloma not having achieved remission: Secondary | ICD-10-CM

## 2020-09-12 DIAGNOSIS — Z95828 Presence of other vascular implants and grafts: Secondary | ICD-10-CM

## 2020-09-12 DIAGNOSIS — Z5112 Encounter for antineoplastic immunotherapy: Secondary | ICD-10-CM | POA: Diagnosis not present

## 2020-09-12 LAB — CMP (CANCER CENTER ONLY)
ALT: 16 U/L (ref 0–44)
AST: 18 U/L (ref 15–41)
Albumin: 3.6 g/dL (ref 3.5–5.0)
Alkaline Phosphatase: 63 U/L (ref 38–126)
Anion gap: 12 (ref 5–15)
BUN: 29 mg/dL — ABNORMAL HIGH (ref 6–20)
CO2: 24 mmol/L (ref 22–32)
Calcium: 8.8 mg/dL — ABNORMAL LOW (ref 8.9–10.3)
Chloride: 105 mmol/L (ref 98–111)
Creatinine: 0.81 mg/dL (ref 0.44–1.00)
GFR, Estimated: >60 mL/min (ref >60)
Glucose, Bld: 116 mg/dL — ABNORMAL HIGH (ref 70–99)
Potassium: 3.9 mmol/L (ref 3.5–5.1)
Sodium: 141 mmol/L (ref 135–145)
Total Bilirubin: 0.5 mg/dL (ref 0.3–1.2)
Total Protein: 5.9 g/dL — ABNORMAL LOW (ref 6.5–8.1)

## 2020-09-12 LAB — CBC WITH DIFFERENTIAL/PLATELET
Abs Immature Granulocytes: 0.23 10*3/uL — ABNORMAL HIGH (ref 0.00–0.07)
Basophils Absolute: 0.1 10*3/uL (ref 0.0–0.1)
Basophils Relative: 0 %
Eosinophils Absolute: 0.7 10*3/uL — ABNORMAL HIGH (ref 0.0–0.5)
Eosinophils Relative: 6 %
HCT: 36.5 % (ref 36.0–46.0)
Hemoglobin: 12.2 g/dL (ref 12.0–15.0)
Immature Granulocytes: 2 %
Lymphocytes Relative: 14 %
Lymphs Abs: 1.6 10*3/uL (ref 0.7–4.0)
MCH: 30.9 pg (ref 26.0–34.0)
MCHC: 33.4 g/dL (ref 30.0–36.0)
MCV: 92.4 fL (ref 80.0–100.0)
Monocytes Absolute: 1.2 10*3/uL — ABNORMAL HIGH (ref 0.1–1.0)
Monocytes Relative: 11 %
Neutro Abs: 7.4 10*3/uL (ref 1.7–7.7)
Neutrophils Relative %: 67 %
Platelets: 141 10*3/uL — ABNORMAL LOW (ref 150–400)
RBC: 3.95 MIL/uL (ref 3.87–5.11)
RDW: 16 % — ABNORMAL HIGH (ref 11.5–15.5)
WBC: 11.2 10*3/uL — ABNORMAL HIGH (ref 4.0–10.5)
nRBC: 0 % (ref 0.0–0.2)

## 2020-09-12 MED ORDER — ACETAMINOPHEN 500 MG PO TABS
ORAL_TABLET | ORAL | Status: AC
  Filled 2020-09-12: qty 2

## 2020-09-12 MED ORDER — DIPHENHYDRAMINE HCL 25 MG PO CAPS
25.0000 mg | ORAL_CAPSULE | Freq: Once | ORAL | Status: AC
  Administered 2020-09-12: 25 mg via ORAL

## 2020-09-12 MED ORDER — DEXAMETHASONE SODIUM PHOSPHATE 100 MG/10ML IJ SOLN
20.0000 mg | Freq: Once | INTRAMUSCULAR | Status: AC
  Administered 2020-09-12: 20 mg via INTRAVENOUS
  Filled 2020-09-12: qty 20

## 2020-09-12 MED ORDER — FAMOTIDINE 20 MG PO TABS
ORAL_TABLET | ORAL | Status: AC
  Filled 2020-09-12: qty 1

## 2020-09-12 MED ORDER — ACETAMINOPHEN 500 MG PO TABS
1000.0000 mg | ORAL_TABLET | Freq: Once | ORAL | Status: AC
  Administered 2020-09-12: 1000 mg via ORAL

## 2020-09-12 MED ORDER — DEXTROSE 5 % IV SOLN
36.0000 mg/m2 | Freq: Once | INTRAVENOUS | Status: AC
  Administered 2020-09-12: 60 mg via INTRAVENOUS
  Filled 2020-09-12: qty 30

## 2020-09-12 MED ORDER — HEPARIN SOD (PORK) LOCK FLUSH 100 UNIT/ML IV SOLN
500.0000 [IU] | Freq: Once | INTRAVENOUS | Status: AC | PRN
  Administered 2020-09-12: 500 [IU]
  Filled 2020-09-12: qty 5

## 2020-09-12 MED ORDER — SODIUM CHLORIDE 0.9% FLUSH
10.0000 mL | Freq: Once | INTRAVENOUS | Status: AC
  Administered 2020-09-12: 10 mL
  Filled 2020-09-12: qty 10

## 2020-09-12 MED ORDER — DIPHENHYDRAMINE HCL 25 MG PO CAPS
ORAL_CAPSULE | ORAL | Status: AC
  Filled 2020-09-12: qty 1

## 2020-09-12 MED ORDER — SODIUM CHLORIDE 0.9% FLUSH
10.0000 mL | INTRAVENOUS | Status: DC | PRN
  Administered 2020-09-12: 10 mL
  Filled 2020-09-12: qty 10

## 2020-09-12 MED ORDER — FAMOTIDINE 20 MG PO TABS
20.0000 mg | ORAL_TABLET | Freq: Once | ORAL | Status: AC
  Administered 2020-09-12: 20 mg via ORAL

## 2020-09-12 MED ORDER — SODIUM CHLORIDE 0.9 % IV SOLN
Freq: Once | INTRAVENOUS | Status: AC
  Filled 2020-09-12: qty 250

## 2020-09-12 NOTE — Telephone Encounter (Signed)
Rescheduled 06/23 appointment times per patient's request. Patient will receive new updated calender.

## 2020-09-12 NOTE — Patient Instructions (Signed)
[facility]  Discharge Instructions: Thank you for choosing [facility] to provide your oncology and hematology care.   If you have a lab appointment with the Cancer Center, please go directly to the Cancer Center and check in at the registration area.   Wear comfortable clothing and clothing appropriate for easy access to any Portacath or PICC line.   We strive to give you quality time with your provider. You may need to reschedule your appointment if you arrive late (15 or more minutes).  Arriving late affects you and other patients whose appointments are after yours.  Also, if you miss three or more appointments without notifying the office, you may be dismissed from the clinic at the provider's discretion.      For prescription refill requests, have your pharmacy contact our office and allow 72 hours for refills to be completed.    Today you received the following chemotherapy and/or immunotherapy agents carfilzomib   To help prevent nausea and vomiting after your treatment, we encourage you to take your nausea medication as directed.  BELOW ARE SYMPTOMS THAT SHOULD BE REPORTED IMMEDIATELY: . *FEVER GREATER THAN 100.4 F (38 C) OR HIGHER . *CHILLS OR SWEATING . *NAUSEA AND VOMITING THAT IS NOT CONTROLLED WITH YOUR NAUSEA MEDICATION . *UNUSUAL SHORTNESS OF BREATH . *UNUSUAL BRUISING OR BLEEDING . *URINARY PROBLEMS (pain or burning when urinating, or frequent urination) . *BOWEL PROBLEMS (unusual diarrhea, constipation, pain near the anus) . TENDERNESS IN MOUTH AND THROAT WITH OR WITHOUT PRESENCE OF ULCERS (sore throat, sores in mouth, or a toothache) . UNUSUAL RASH, SWELLING OR PAIN  . UNUSUAL VAGINAL DISCHARGE OR ITCHING   Items with * indicate a potential emergency and should be followed up as soon as possible or go to the Emergency Department if any problems should occur.  Please show the CHEMOTHERAPY ALERT CARD or IMMUNOTHERAPY ALERT  CARD at check-in to the Emergency Department and triage nurse.  Should you have questions after your visit or need to cancel or reschedule your appointment, please contact [facility]  Dept: [phone]  and follow the prompts.  Office hours are 8:00 a.m. to 4:30 p.m. Monday - Friday. Please note that voicemails left after 4:00 p.m. may not be returned until the following business day.  We are closed weekends and major holidays. You have access to a nurse at all times for urgent questions. Please call the main number to the clinic Dept: [phone] and follow the prompts.   For any non-urgent questions, you may also contact your provider using MyChart. We now offer e-Visits for anyone 18 and older to request care online for non-urgent symptoms. For details visit [email or web address].   Also download the MyChart app! Go to the app store, search "MyChart", open the app, select [place], and log in with your MyChart username and password.  Due to Covid, a mask is required upon entering the hospital/clinic. If you do not have a mask, one will be given to you upon arrival. For doctor visits, patients may have 1 support person aged 18 or older with them. For treatment visits, patients cannot have anyone with them due to current Covid guidelines and our immunocompromised population.   

## 2020-09-13 ENCOUNTER — Inpatient Hospital Stay: Payer: BC Managed Care – PPO

## 2020-09-13 ENCOUNTER — Other Ambulatory Visit: Payer: Self-pay | Admitting: Hematology

## 2020-09-13 VITALS — BP 143/66 | HR 82 | Temp 97.7°F

## 2020-09-13 DIAGNOSIS — C9000 Multiple myeloma not having achieved remission: Secondary | ICD-10-CM

## 2020-09-13 DIAGNOSIS — Z7189 Other specified counseling: Secondary | ICD-10-CM

## 2020-09-13 DIAGNOSIS — Z5112 Encounter for antineoplastic immunotherapy: Secondary | ICD-10-CM | POA: Diagnosis not present

## 2020-09-13 MED ORDER — FAMOTIDINE 20 MG PO TABS
ORAL_TABLET | ORAL | Status: AC
  Filled 2020-09-13: qty 1

## 2020-09-13 MED ORDER — HEPARIN SOD (PORK) LOCK FLUSH 100 UNIT/ML IV SOLN
500.0000 [IU] | Freq: Once | INTRAVENOUS | Status: AC | PRN
  Administered 2020-09-13: 500 [IU]
  Filled 2020-09-13: qty 5

## 2020-09-13 MED ORDER — SODIUM CHLORIDE 0.9 % IV SOLN
Freq: Once | INTRAVENOUS | Status: AC
  Filled 2020-09-13: qty 250

## 2020-09-13 MED ORDER — DIPHENHYDRAMINE HCL 25 MG PO CAPS
ORAL_CAPSULE | ORAL | Status: AC
  Filled 2020-09-13: qty 1

## 2020-09-13 MED ORDER — FAMOTIDINE 20 MG PO TABS
20.0000 mg | ORAL_TABLET | Freq: Once | ORAL | Status: AC
  Administered 2020-09-13: 20 mg via ORAL

## 2020-09-13 MED ORDER — DIPHENHYDRAMINE HCL 25 MG PO CAPS
25.0000 mg | ORAL_CAPSULE | Freq: Once | ORAL | Status: AC
  Administered 2020-09-13: 25 mg via ORAL

## 2020-09-13 MED ORDER — ACETAMINOPHEN 500 MG PO TABS
ORAL_TABLET | ORAL | Status: AC
  Filled 2020-09-13: qty 2

## 2020-09-13 MED ORDER — SODIUM CHLORIDE 0.9% FLUSH
10.0000 mL | INTRAVENOUS | Status: DC | PRN
  Administered 2020-09-13: 10 mL
  Filled 2020-09-13: qty 10

## 2020-09-13 MED ORDER — DEXAMETHASONE SODIUM PHOSPHATE 100 MG/10ML IJ SOLN
20.0000 mg | Freq: Once | INTRAMUSCULAR | Status: AC
  Administered 2020-09-13: 20 mg via INTRAVENOUS
  Filled 2020-09-13: qty 20

## 2020-09-13 MED ORDER — DEXTROSE 5 % IV SOLN
36.0000 mg/m2 | Freq: Once | INTRAVENOUS | Status: AC
  Administered 2020-09-13: 60 mg via INTRAVENOUS
  Filled 2020-09-13: qty 30

## 2020-09-13 MED ORDER — ACETAMINOPHEN 500 MG PO TABS
1000.0000 mg | ORAL_TABLET | Freq: Once | ORAL | Status: AC
  Administered 2020-09-13: 1000 mg via ORAL

## 2020-09-13 NOTE — Patient Instructions (Signed)
[facility]  Discharge Instructions: Thank you for choosing [facility] to provide your oncology and hematology care.   If you have a lab appointment with the [facility], please go directly to the [facility] and check in at the registration area.   Wear comfortable clothing and clothing appropriate for easy access to any Portacath or PICC line.   We strive to give you quality time with your provider. You may need to reschedule your appointment if you arrive late (15 or more minutes).  Arriving late affects you and other patients whose appointments are after yours.  Also, if you miss three or more appointments without notifying the office, you may be dismissed from the clinic at the provider's discretion.      For prescription refill requests, have your pharmacy contact our office and allow 72 hours for refills to be completed.    Today you received the following chemotherapy and/or immunotherapy agents: carfilzomib.     To help prevent nausea and vomiting after your treatment, we encourage you to take your nausea medication as directed.  BELOW ARE SYMPTOMS THAT SHOULD BE REPORTED IMMEDIATELY: *FEVER GREATER THAN 100.4 F (38 C) OR HIGHER *CHILLS OR SWEATING *NAUSEA AND VOMITING THAT IS NOT CONTROLLED WITH YOUR NAUSEA MEDICATION *UNUSUAL SHORTNESS OF BREATH *UNUSUAL BRUISING OR BLEEDING *URINARY PROBLEMS (pain or burning when urinating, or frequent urination) *BOWEL PROBLEMS (unusual diarrhea, constipation, pain near the anus) TENDERNESS IN MOUTH AND THROAT WITH OR WITHOUT PRESENCE OF ULCERS (sore throat, sores in mouth, or a toothache) UNUSUAL RASH, SWELLING OR PAIN  UNUSUAL VAGINAL DISCHARGE OR ITCHING   Items with * indicate a potential emergency and should be followed up as soon as possible or go to the Emergency Department if any problems should occur.  Please show the CHEMOTHERAPY ALERT CARD or IMMUNOTHERAPY ALERT CARD at check-in  to the Emergency Department and triage nurse.  Should you have questions after your visit or need to cancel or reschedule your appointment, please contact [facility]  Dept: [phone]  and follow the prompts.  Office hours are 8:00 a.m. to 4:30 p.m. Monday - Friday. Please note that voicemails left after 4:00 p.m. may not be returned until the following business day.  We are closed weekends and major holidays. You have access to a nurse at all times for urgent questions. Please call the main number to the clinic Dept: [phone] and follow the prompts.   For any non-urgent questions, you may also contact your provider using MyChart. We now offer e-Visits for anyone 22 and older to request care online for non-urgent symptoms. For details visit [email or web address].   Also download the MyChart app! Go to the app store, search "MyChart", open the app, select [facility], and log in with your MyChart username and password.  Due to Covid, a mask is required upon entering the hospital/clinic. If you do not have a mask, one will be given to you upon arrival. For doctor visits, patients may have 1 support person aged 57 or older with them. For treatment visits, patients cannot have anyone with them due to current Covid guidelines and our immunocompromised population.     To help prevent nausea and vomiting after your treatment, we encourage you to take your nausea medication as directed.  BELOW ARE SYMPTOMS THAT SHOULD BE REPORTED IMMEDIATELY: *FEVER GREATER THAN 100.4 F (38 C) OR HIGHER *CHILLS OR SWEATING *NAUSEA AND VOMITING THAT IS NOT CONTROLLED WITH  YOUR NAUSEA MEDICATION *UNUSUAL SHORTNESS OF BREATH *UNUSUAL BRUISING OR BLEEDING *URINARY PROBLEMS (pain or burning when urinating, or frequent urination) *BOWEL PROBLEMS (unusual diarrhea, constipation, pain near the anus) TENDERNESS IN MOUTH AND THROAT WITH OR WITHOUT PRESENCE OF ULCERS (sore throat, sores in  mouth, or a toothache) UNUSUAL RASH, SWELLING OR PAIN  UNUSUAL VAGINAL DISCHARGE OR ITCHING   Items with * indicate a potential emergency and should be followed up as soon as possible or go to the Emergency Department if any problems should occur.  Please show the CHEMOTHERAPY ALERT CARD or IMMUNOTHERAPY ALERT CARD at check-in to the Emergency Department and triage nurse.  Should you have questions after your visit or need to cancel or reschedule your appointment, please contact [facility]  Dept: [phone]  and follow the prompts.  Office hours are 8:00 a.m. to 4:30 p.m. Monday - Friday. Please note that voicemails left after 4:00 p.m. may not be returned until the following business day.  We are closed weekends and major holidays. You have access to a nurse at all times for urgent questions. Please call the main number to the clinic Dept: [phone] and follow the prompts.   For any non-urgent questions, you may also contact your provider using MyChart. We now offer e-Visits for anyone 51 and older to request care online for non-urgent symptoms. For details visit [email or web address].   Also download the MyChart app! Go to the app store, search "MyChart", open the app, select [facility], and log in with your MyChart username and password.  Due to Covid, a mask is required upon entering the hospital/clinic. If you do not have a mask, one will be given to you upon arrival. For doctor visits, patients may have 1 support person aged 36 or older with them. For treatment visits, patients cannot have anyone with them due to current Covid guidelines and our immunocompromised population.

## 2020-09-16 LAB — MULTIPLE MYELOMA PANEL, SERUM
Albumin SerPl Elph-Mcnc: 3.6 g/dL (ref 2.9–4.4)
Albumin/Glob SerPl: 1.9 — ABNORMAL HIGH (ref 0.7–1.7)
Alpha 1: 0.2 g/dL (ref 0.0–0.4)
Alpha2 Glob SerPl Elph-Mcnc: 0.7 g/dL (ref 0.4–1.0)
B-Globulin SerPl Elph-Mcnc: 0.7 g/dL (ref 0.7–1.3)
Gamma Glob SerPl Elph-Mcnc: 0.3 g/dL — ABNORMAL LOW (ref 0.4–1.8)
Globulin, Total: 1.9 g/dL — ABNORMAL LOW (ref 2.2–3.9)
IgA: 105 mg/dL (ref 87–352)
IgG (Immunoglobin G), Serum: 288 mg/dL — ABNORMAL LOW (ref 586–1602)
IgM (Immunoglobulin M), Srm: 17 mg/dL — ABNORMAL LOW (ref 26–217)
M Protein SerPl Elph-Mcnc: 0.2 g/dL — ABNORMAL HIGH
Total Protein ELP: 5.5 g/dL — ABNORMAL LOW (ref 6.0–8.5)

## 2020-09-17 ENCOUNTER — Other Ambulatory Visit: Payer: Self-pay

## 2020-09-17 ENCOUNTER — Other Ambulatory Visit: Payer: Self-pay | Admitting: Hematology

## 2020-09-17 DIAGNOSIS — Z7189 Other specified counseling: Secondary | ICD-10-CM

## 2020-09-17 DIAGNOSIS — C9000 Multiple myeloma not having achieved remission: Secondary | ICD-10-CM

## 2020-09-26 ENCOUNTER — Other Ambulatory Visit: Payer: BC Managed Care – PPO

## 2020-09-26 ENCOUNTER — Other Ambulatory Visit: Payer: Self-pay

## 2020-09-26 ENCOUNTER — Ambulatory Visit: Payer: BC Managed Care – PPO

## 2020-09-26 ENCOUNTER — Inpatient Hospital Stay: Payer: BC Managed Care – PPO

## 2020-09-26 VITALS — BP 131/63 | HR 75 | Temp 97.8°F | Resp 18 | Ht 65.0 in | Wt 137.5 lb

## 2020-09-26 DIAGNOSIS — C9000 Multiple myeloma not having achieved remission: Secondary | ICD-10-CM

## 2020-09-26 DIAGNOSIS — Z5112 Encounter for antineoplastic immunotherapy: Secondary | ICD-10-CM | POA: Diagnosis not present

## 2020-09-26 DIAGNOSIS — Z7189 Other specified counseling: Secondary | ICD-10-CM

## 2020-09-26 DIAGNOSIS — Z95828 Presence of other vascular implants and grafts: Secondary | ICD-10-CM

## 2020-09-26 LAB — CBC WITH DIFFERENTIAL/PLATELET
Abs Immature Granulocytes: 0.11 10*3/uL — ABNORMAL HIGH (ref 0.00–0.07)
Basophils Absolute: 0.1 10*3/uL (ref 0.0–0.1)
Basophils Relative: 1 %
Eosinophils Absolute: 0.1 10*3/uL (ref 0.0–0.5)
Eosinophils Relative: 2 %
HCT: 37.4 % (ref 36.0–46.0)
Hemoglobin: 12.4 g/dL (ref 12.0–15.0)
Immature Granulocytes: 2 %
Lymphocytes Relative: 33 %
Lymphs Abs: 2.3 10*3/uL (ref 0.7–4.0)
MCH: 31.4 pg (ref 26.0–34.0)
MCHC: 33.2 g/dL (ref 30.0–36.0)
MCV: 94.7 fL (ref 80.0–100.0)
Monocytes Absolute: 0.8 10*3/uL (ref 0.1–1.0)
Monocytes Relative: 11 %
Neutro Abs: 3.6 10*3/uL (ref 1.7–7.7)
Neutrophils Relative %: 51 %
Platelets: 282 10*3/uL (ref 150–400)
RBC: 3.95 MIL/uL (ref 3.87–5.11)
RDW: 16.2 % — ABNORMAL HIGH (ref 11.5–15.5)
WBC: 7 10*3/uL (ref 4.0–10.5)
nRBC: 0 % (ref 0.0–0.2)

## 2020-09-26 LAB — CMP (CANCER CENTER ONLY)
ALT: 15 U/L (ref 0–44)
AST: 15 U/L (ref 15–41)
Albumin: 3.8 g/dL (ref 3.5–5.0)
Alkaline Phosphatase: 63 U/L (ref 38–126)
Anion gap: 11 (ref 5–15)
BUN: 26 mg/dL — ABNORMAL HIGH (ref 6–20)
CO2: 22 mmol/L (ref 22–32)
Calcium: 8.9 mg/dL (ref 8.9–10.3)
Chloride: 106 mmol/L (ref 98–111)
Creatinine: 0.88 mg/dL (ref 0.44–1.00)
GFR, Estimated: >60 mL/min (ref >60)
Glucose, Bld: 117 mg/dL — ABNORMAL HIGH (ref 70–99)
Potassium: 4 mmol/L (ref 3.5–5.1)
Sodium: 139 mmol/L (ref 135–145)
Total Bilirubin: 0.6 mg/dL (ref 0.3–1.2)
Total Protein: 6 g/dL — ABNORMAL LOW (ref 6.5–8.1)

## 2020-09-26 MED ORDER — DIPHENHYDRAMINE HCL 25 MG PO CAPS
ORAL_CAPSULE | ORAL | Status: AC
  Filled 2020-09-26: qty 1

## 2020-09-26 MED ORDER — SODIUM CHLORIDE 0.9 % IV SOLN
Freq: Once | INTRAVENOUS | Status: AC
  Filled 2020-09-26: qty 250

## 2020-09-26 MED ORDER — ACETAMINOPHEN 500 MG PO TABS
ORAL_TABLET | ORAL | Status: AC
  Filled 2020-09-26: qty 2

## 2020-09-26 MED ORDER — HEPARIN SOD (PORK) LOCK FLUSH 100 UNIT/ML IV SOLN
500.0000 [IU] | Freq: Once | INTRAVENOUS | Status: AC | PRN
  Administered 2020-09-26: 500 [IU]
  Filled 2020-09-26: qty 5

## 2020-09-26 MED ORDER — ACETAMINOPHEN 500 MG PO TABS
1000.0000 mg | ORAL_TABLET | Freq: Once | ORAL | Status: AC
  Administered 2020-09-26: 1000 mg via ORAL

## 2020-09-26 MED ORDER — SODIUM CHLORIDE 0.9% FLUSH
10.0000 mL | Freq: Once | INTRAVENOUS | Status: AC
  Administered 2020-09-26: 10 mL
  Filled 2020-09-26: qty 10

## 2020-09-26 MED ORDER — FAMOTIDINE 20 MG PO TABS
ORAL_TABLET | ORAL | Status: AC
  Filled 2020-09-26: qty 1

## 2020-09-26 MED ORDER — DIPHENHYDRAMINE HCL 25 MG PO CAPS
25.0000 mg | ORAL_CAPSULE | Freq: Once | ORAL | Status: AC
  Administered 2020-09-26: 25 mg via ORAL

## 2020-09-26 MED ORDER — SODIUM CHLORIDE 0.9 % IV SOLN
20.0000 mg | Freq: Once | INTRAVENOUS | Status: AC
  Administered 2020-09-26: 20 mg via INTRAVENOUS
  Filled 2020-09-26: qty 20

## 2020-09-26 MED ORDER — FAMOTIDINE 20 MG PO TABS
20.0000 mg | ORAL_TABLET | Freq: Once | ORAL | Status: AC
  Administered 2020-09-26: 20 mg via ORAL

## 2020-09-26 MED ORDER — SODIUM CHLORIDE 0.9% FLUSH
10.0000 mL | INTRAVENOUS | Status: DC | PRN
  Administered 2020-09-26: 10 mL
  Filled 2020-09-26: qty 10

## 2020-09-26 MED ORDER — DEXTROSE 5 % IV SOLN
36.0000 mg/m2 | Freq: Once | INTRAVENOUS | Status: AC
  Administered 2020-09-26: 60 mg via INTRAVENOUS
  Filled 2020-09-26: qty 30

## 2020-09-26 NOTE — Patient Instructions (Signed)
[facility]   Discharge Instructions: Thank you for choosing [facility] to provide your oncology and hematology care.   If you have a lab appointment with the Cancer Center, please go directly to the Cancer Center and check in at the registration area.   Wear comfortable clothing and clothing appropriate for easy access to any Portacath or PICC line.   We strive to give you quality time with your provider. You may need to reschedule your appointment if you arrive late (15 or more minutes).  Arriving late affects you and other patients whose appointments are after yours.  Also, if you miss three or more appointments without notifying the office, you may be dismissed from the clinic at the provider's discretion.      For prescription refill requests, have your pharmacy contact our office and allow 72 hours for refills to be completed.    Today you received the following chemotherapy and/or immunotherapy agents: Carfilzomib (Kyprolis)     To help prevent nausea and vomiting after your treatment, we encourage you to take your nausea medication as directed.  BELOW ARE SYMPTOMS THAT SHOULD BE REPORTED IMMEDIATELY: *FEVER GREATER THAN 100.4 F (38 C) OR HIGHER *CHILLS OR SWEATING *NAUSEA AND VOMITING THAT IS NOT CONTROLLED WITH YOUR NAUSEA MEDICATION *UNUSUAL SHORTNESS OF BREATH *UNUSUAL BRUISING OR BLEEDING *URINARY PROBLEMS (pain or burning when urinating, or frequent urination) *BOWEL PROBLEMS (unusual diarrhea, constipation, pain near the anus) TENDERNESS IN MOUTH AND THROAT WITH OR WITHOUT PRESENCE OF ULCERS (sore throat, sores in mouth, or a toothache) UNUSUAL RASH, SWELLING OR PAIN  UNUSUAL VAGINAL DISCHARGE OR ITCHING   Items with * indicate a potential emergency and should be followed up as soon as possible or go to the Emergency Department if any problems should occur.  Please show the CHEMOTHERAPY ALERT CARD or IMMUNOTHERAPY ALERT CARD  at check-in to the Emergency Department and triage nurse.  Should you have questions after your visit or need to cancel or reschedule your appointment, please contact [facility]  Dept: [phone]  and follow the prompts.  Office hours are 8:00 a.m. to 4:30 p.m. Monday - Friday. Please note that voicemails left after 4:00 p.m. may not be returned until the following business day.  We are closed weekends and major holidays. You have access to a nurse at all times for urgent questions. Please call the main number to the clinic Dept: [phone] and follow the prompts.   For any non-urgent questions, you may also contact your provider using MyChart. We now offer e-Visits for anyone 18 and older to request care online for non-urgent symptoms. For details visit [email or web address].   Also download the MyChart app! Go to the app store, search "MyChart", open the app, select [facility], and log in with your MyChart username and password.  Due to Covid, a mask is required upon entering the hospital/clinic. If you do not have a mask, one will be given to you upon arrival. For doctor visits, patients may have 1 support person aged 18 or older with them. For treatment visits, patients cannot have anyone with them due to current Covid guidelines and our immunocompromised population.  

## 2020-09-27 ENCOUNTER — Ambulatory Visit: Payer: BC Managed Care – PPO

## 2020-09-27 ENCOUNTER — Inpatient Hospital Stay: Payer: BC Managed Care – PPO

## 2020-09-27 VITALS — BP 127/62 | HR 70 | Temp 99.1°F | Resp 16

## 2020-09-27 DIAGNOSIS — Z5112 Encounter for antineoplastic immunotherapy: Secondary | ICD-10-CM | POA: Diagnosis not present

## 2020-09-27 DIAGNOSIS — Z7189 Other specified counseling: Secondary | ICD-10-CM

## 2020-09-27 DIAGNOSIS — C9000 Multiple myeloma not having achieved remission: Secondary | ICD-10-CM

## 2020-09-27 MED ORDER — SODIUM CHLORIDE 0.9 % IV SOLN
Freq: Once | INTRAVENOUS | Status: AC
  Filled 2020-09-27: qty 250

## 2020-09-27 MED ORDER — SODIUM CHLORIDE 0.9 % IV SOLN
20.0000 mg | Freq: Once | INTRAVENOUS | Status: AC
  Administered 2020-09-27: 20 mg via INTRAVENOUS
  Filled 2020-09-27: qty 20

## 2020-09-27 MED ORDER — ACETAMINOPHEN 500 MG PO TABS
1000.0000 mg | ORAL_TABLET | Freq: Once | ORAL | Status: AC
  Administered 2020-09-27: 1000 mg via ORAL

## 2020-09-27 MED ORDER — DEXTROSE 5 % IV SOLN
36.0000 mg/m2 | Freq: Once | INTRAVENOUS | Status: AC
  Administered 2020-09-27: 60 mg via INTRAVENOUS
  Filled 2020-09-27: qty 30

## 2020-09-27 MED ORDER — SODIUM CHLORIDE 0.9% FLUSH
10.0000 mL | INTRAVENOUS | Status: DC | PRN
  Administered 2020-09-27: 10 mL
  Filled 2020-09-27: qty 10

## 2020-09-27 MED ORDER — FAMOTIDINE 20 MG PO TABS
20.0000 mg | ORAL_TABLET | Freq: Once | ORAL | Status: AC
  Administered 2020-09-27: 20 mg via ORAL

## 2020-09-27 MED ORDER — HEPARIN SOD (PORK) LOCK FLUSH 100 UNIT/ML IV SOLN
500.0000 [IU] | Freq: Once | INTRAVENOUS | Status: AC | PRN
  Administered 2020-09-27: 500 [IU]
  Filled 2020-09-27: qty 5

## 2020-09-27 MED ORDER — DIPHENHYDRAMINE HCL 25 MG PO CAPS
25.0000 mg | ORAL_CAPSULE | Freq: Once | ORAL | Status: AC
  Administered 2020-09-27: 25 mg via ORAL

## 2020-09-27 MED ORDER — DIPHENHYDRAMINE HCL 25 MG PO CAPS
ORAL_CAPSULE | ORAL | Status: AC
  Filled 2020-09-27: qty 1

## 2020-09-27 MED ORDER — ACETAMINOPHEN 500 MG PO TABS
ORAL_TABLET | ORAL | Status: AC
  Filled 2020-09-27: qty 2

## 2020-09-27 MED ORDER — FAMOTIDINE 20 MG PO TABS
ORAL_TABLET | ORAL | Status: AC
  Filled 2020-09-27: qty 1

## 2020-09-27 NOTE — Patient Instructions (Signed)
[facility]   Discharge Instructions: Thank you for choosing [facility] to provide your oncology and hematology care.   If you have a lab appointment with the Cancer Center, please go directly to the Cancer Center and check in at the registration area.   Wear comfortable clothing and clothing appropriate for easy access to any Portacath or PICC line.   We strive to give you quality time with your provider. You may need to reschedule your appointment if you arrive late (15 or more minutes).  Arriving late affects you and other patients whose appointments are after yours.  Also, if you miss three or more appointments without notifying the office, you may be dismissed from the clinic at the provider's discretion.      For prescription refill requests, have your pharmacy contact our office and allow 72 hours for refills to be completed.    Today you received the following chemotherapy and/or immunotherapy agents: Carfilzomib (Kyprolis)     To help prevent nausea and vomiting after your treatment, we encourage you to take your nausea medication as directed.  BELOW ARE SYMPTOMS THAT SHOULD BE REPORTED IMMEDIATELY: *FEVER GREATER THAN 100.4 F (38 C) OR HIGHER *CHILLS OR SWEATING *NAUSEA AND VOMITING THAT IS NOT CONTROLLED WITH YOUR NAUSEA MEDICATION *UNUSUAL SHORTNESS OF BREATH *UNUSUAL BRUISING OR BLEEDING *URINARY PROBLEMS (pain or burning when urinating, or frequent urination) *BOWEL PROBLEMS (unusual diarrhea, constipation, pain near the anus) TENDERNESS IN MOUTH AND THROAT WITH OR WITHOUT PRESENCE OF ULCERS (sore throat, sores in mouth, or a toothache) UNUSUAL RASH, SWELLING OR PAIN  UNUSUAL VAGINAL DISCHARGE OR ITCHING   Items with * indicate a potential emergency and should be followed up as soon as possible or go to the Emergency Department if any problems should occur.  Please show the CHEMOTHERAPY ALERT CARD or IMMUNOTHERAPY ALERT CARD  at check-in to the Emergency Department and triage nurse.  Should you have questions after your visit or need to cancel or reschedule your appointment, please contact [facility]  Dept: [phone]  and follow the prompts.  Office hours are 8:00 a.m. to 4:30 p.m. Monday - Friday. Please note that voicemails left after 4:00 p.m. may not be returned until the following business day.  We are closed weekends and major holidays. You have access to a nurse at all times for urgent questions. Please call the main number to the clinic Dept: [phone] and follow the prompts.   For any non-urgent questions, you may also contact your provider using MyChart. We now offer e-Visits for anyone 18 and older to request care online for non-urgent symptoms. For details visit [email or web address].   Also download the MyChart app! Go to the app store, search "MyChart", open the app, select [facility], and log in with your MyChart username and password.  Due to Covid, a mask is required upon entering the hospital/clinic. If you do not have a mask, one will be given to you upon arrival. For doctor visits, patients may have 1 support person aged 18 or older with them. For treatment visits, patients cannot have anyone with them due to current Covid guidelines and our immunocompromised population.  

## 2020-09-28 ENCOUNTER — Other Ambulatory Visit: Payer: Self-pay | Admitting: Hematology

## 2020-09-28 DIAGNOSIS — C9000 Multiple myeloma not having achieved remission: Secondary | ICD-10-CM

## 2020-09-28 DIAGNOSIS — Z7189 Other specified counseling: Secondary | ICD-10-CM

## 2020-10-01 ENCOUNTER — Telehealth: Payer: Self-pay | Admitting: Hematology

## 2020-10-01 NOTE — Telephone Encounter (Signed)
Scheduled follow-up appointment. Patient is aware. 

## 2020-10-01 NOTE — Telephone Encounter (Signed)
Rescheduled upcoming appointments due to provider out of office. Patient is aware of changes.

## 2020-10-03 ENCOUNTER — Other Ambulatory Visit: Payer: BC Managed Care – PPO

## 2020-10-03 ENCOUNTER — Other Ambulatory Visit: Payer: Self-pay

## 2020-10-03 ENCOUNTER — Ambulatory Visit: Payer: BC Managed Care – PPO | Admitting: Hematology

## 2020-10-03 ENCOUNTER — Inpatient Hospital Stay: Payer: BC Managed Care – PPO

## 2020-10-03 VITALS — BP 136/66 | HR 86 | Temp 98.1°F | Resp 18 | Ht 65.0 in | Wt 139.0 lb

## 2020-10-03 DIAGNOSIS — Z7189 Other specified counseling: Secondary | ICD-10-CM

## 2020-10-03 DIAGNOSIS — C9000 Multiple myeloma not having achieved remission: Secondary | ICD-10-CM

## 2020-10-03 DIAGNOSIS — Z5112 Encounter for antineoplastic immunotherapy: Secondary | ICD-10-CM | POA: Diagnosis not present

## 2020-10-03 DIAGNOSIS — Z95828 Presence of other vascular implants and grafts: Secondary | ICD-10-CM

## 2020-10-03 LAB — CBC WITH DIFFERENTIAL/PLATELET
Abs Immature Granulocytes: 0.39 10*3/uL — ABNORMAL HIGH (ref 0.00–0.07)
Basophils Absolute: 0.1 10*3/uL (ref 0.0–0.1)
Basophils Relative: 1 %
Eosinophils Absolute: 0.2 10*3/uL (ref 0.0–0.5)
Eosinophils Relative: 3 %
HCT: 35.7 % — ABNORMAL LOW (ref 36.0–46.0)
Hemoglobin: 11.9 g/dL — ABNORMAL LOW (ref 12.0–15.0)
Immature Granulocytes: 5 %
Lymphocytes Relative: 23 %
Lymphs Abs: 1.8 10*3/uL (ref 0.7–4.0)
MCH: 32.2 pg (ref 26.0–34.0)
MCHC: 33.3 g/dL (ref 30.0–36.0)
MCV: 96.5 fL (ref 80.0–100.0)
Monocytes Absolute: 0.6 10*3/uL (ref 0.1–1.0)
Monocytes Relative: 8 %
Neutro Abs: 4.5 10*3/uL (ref 1.7–7.7)
Neutrophils Relative %: 60 %
Platelets: 146 10*3/uL — ABNORMAL LOW (ref 150–400)
RBC: 3.7 MIL/uL — ABNORMAL LOW (ref 3.87–5.11)
RDW: 15.8 % — ABNORMAL HIGH (ref 11.5–15.5)
WBC: 7.5 10*3/uL (ref 4.0–10.5)
nRBC: 0 % (ref 0.0–0.2)

## 2020-10-03 LAB — CMP (CANCER CENTER ONLY)
ALT: 11 U/L (ref 0–44)
AST: 13 U/L — ABNORMAL LOW (ref 15–41)
Albumin: 3.6 g/dL (ref 3.5–5.0)
Alkaline Phosphatase: 56 U/L (ref 38–126)
Anion gap: 9 (ref 5–15)
BUN: 24 mg/dL — ABNORMAL HIGH (ref 6–20)
CO2: 24 mmol/L (ref 22–32)
Calcium: 8.7 mg/dL — ABNORMAL LOW (ref 8.9–10.3)
Chloride: 107 mmol/L (ref 98–111)
Creatinine: 1.05 mg/dL — ABNORMAL HIGH (ref 0.44–1.00)
GFR, Estimated: >60 mL/min (ref >60)
Glucose, Bld: 112 mg/dL — ABNORMAL HIGH (ref 70–99)
Potassium: 3.9 mmol/L (ref 3.5–5.1)
Sodium: 140 mmol/L (ref 135–145)
Total Bilirubin: 0.7 mg/dL (ref 0.3–1.2)
Total Protein: 5.8 g/dL — ABNORMAL LOW (ref 6.5–8.1)

## 2020-10-03 MED ORDER — DEXTROSE 5 % IV SOLN
36.0000 mg/m2 | Freq: Once | INTRAVENOUS | Status: AC
  Administered 2020-10-03: 60 mg via INTRAVENOUS
  Filled 2020-10-03: qty 30

## 2020-10-03 MED ORDER — SODIUM CHLORIDE 0.9% FLUSH
10.0000 mL | Freq: Once | INTRAVENOUS | Status: AC
  Administered 2020-10-03: 10 mL
  Filled 2020-10-03: qty 10

## 2020-10-03 MED ORDER — ACETAMINOPHEN 500 MG PO TABS
1000.0000 mg | ORAL_TABLET | Freq: Once | ORAL | Status: AC
  Administered 2020-10-03: 1000 mg via ORAL

## 2020-10-03 MED ORDER — SODIUM CHLORIDE 0.9 % IV SOLN
20.0000 mg | Freq: Once | INTRAVENOUS | Status: AC
  Administered 2020-10-03: 20 mg via INTRAVENOUS
  Filled 2020-10-03: qty 20

## 2020-10-03 MED ORDER — DIPHENHYDRAMINE HCL 25 MG PO CAPS
25.0000 mg | ORAL_CAPSULE | Freq: Once | ORAL | Status: AC
  Administered 2020-10-03: 25 mg via ORAL

## 2020-10-03 MED ORDER — FAMOTIDINE 20 MG PO TABS
20.0000 mg | ORAL_TABLET | Freq: Once | ORAL | Status: AC
Start: 2020-10-03 — End: 2020-10-03
  Administered 2020-10-03: 20 mg via ORAL

## 2020-10-03 MED ORDER — SODIUM CHLORIDE 0.9% FLUSH
10.0000 mL | INTRAVENOUS | Status: DC | PRN
  Administered 2020-10-03: 10 mL
  Filled 2020-10-03: qty 10

## 2020-10-03 MED ORDER — SODIUM CHLORIDE 0.9 % IV SOLN
Freq: Once | INTRAVENOUS | Status: DC
  Filled 2020-10-03: qty 250

## 2020-10-03 MED ORDER — SODIUM CHLORIDE 0.9 % IV SOLN
Freq: Once | INTRAVENOUS | Status: AC
  Filled 2020-10-03: qty 250

## 2020-10-03 MED ORDER — ACETAMINOPHEN 500 MG PO TABS
ORAL_TABLET | ORAL | Status: AC
  Filled 2020-10-03: qty 2

## 2020-10-03 MED ORDER — HEPARIN SOD (PORK) LOCK FLUSH 100 UNIT/ML IV SOLN
500.0000 [IU] | Freq: Once | INTRAVENOUS | Status: AC | PRN
  Administered 2020-10-03: 500 [IU]
  Filled 2020-10-03: qty 5

## 2020-10-03 MED ORDER — FAMOTIDINE 20 MG PO TABS
ORAL_TABLET | ORAL | Status: AC
  Filled 2020-10-03: qty 1

## 2020-10-03 MED ORDER — DIPHENHYDRAMINE HCL 25 MG PO CAPS
ORAL_CAPSULE | ORAL | Status: AC
  Filled 2020-10-03: qty 1

## 2020-10-03 NOTE — Progress Notes (Signed)
HEMATOLOGY/ONCOLOGY CONSULTATION NOTE  Date of Service: 10/04/2020  Patient Care Team: Megan Kaufmann, MD as PCP - General (Family Medicine) Megan Velasquez, [name], MD as PCP - Cardiology (Cardiology) Megan Velasquez Megan Estimable, MD as Consulting Physician (Gastroenterology)  CHIEF COMPLAINTS/PURPOSE OF CONSULTATION:  Multiple Myeloma  HISTORY OF PRESENTING ILLNESS:   Megan Velasquez is a wonderful 60 y.o. female who has been referred to [place] for evaluation and management of multiple myeloma. Pt is accompanied today by her husband for this visit.    Patient was referred to [place] for second opinion regarding management of newly diagnosed multiple myeloma by Dr. Laurena Slimmer MD.  Patient had been referred to to [facility] center by her primary care physician for anemia and elevated serum protein levels.  She also had some increasing fatigue and tingling in her hands and feet.  Noted primarily in the right wrist and right ankle.  B12 levels were noted to be borderline low.  She had a work-up including labs which showed hemoglobin of 9.7 with an MCV of 86.9 WBC count of 6.95k and platelets of 165k. CMP showed a creatinine of 1.06 calcium level of 8.9 total protein of 10.2 with an albumin of 2.7 normal liver function tests and other electrolytes. Serum protein electrophoresis with an M spike of 4.3 with an additional M spike of 0.2 g/dL.  IgA quantitative more than 6400. B12 levels 276, homocystine 15.9 Serum folate 26.5 LDH 96 Beta-2 microglobulin of 2.5  Serum kappa lambda free light chains showed free kappa light chains of 181, free lambda light chains of 3.6 and a free kappa lambda ratio of 50.3.  24-hour UPEP showed no M spike and a total protein of 5.7 mg/dL.  Bone Survey completed on 03/08/2020 with results revealing "No evidence of lytic nor blastic lesions within the appendicular or axial skeleton. Degenerative changes as described above."   On review of systems, pt reports some mild fatigue.  No  focal bone pains.  No chest pain.  No shortness of breath. No evidence of GI bleeding no hematuria no nosebleeds or gum bleeds. No other acute new focal symptoms. Denies any history of known cardiopulmonary disease strokes or heart attacks.  INTERVAL HISTORY  I connected with Lerry Liner on 10/04/2020 by telephone and verified that I am speaking with the correct person using two identifiers.   I discussed the limitations of evaluation and management by telemedicine. The patient expressed understanding and agreed to proceed.   Other persons participating in the visit and their role in the encounter:                                                         - Megan Velasquez, Medical Scribe     Patient's location: Home Provider's location: Home  Megan Velasquez is a wonderful 60 y.o. female who is here today for evaluation and management of multiple myeloma. The patient's last visit with [place] was on 09/01/2020. The pt reports that she is doing well overall. She is here for toxicity check prior to [place].  The pt reports that she is doing well since the last visit with no new concerns. She has been referred to [facility] and Dr. Effie Berkshire. She continues to tolerate the treatment well with some intermittent diarrhea. She is taking Imodium around 2-3x  in a month. The patient notes she has an appointment with Dr. Amalia Hailey on July 5.  Lab results 10/03/2020 of CBC w/diff and CMP is as follows: all values are WNL except for Glucose of 112, BUN of 24, Creatinine of 1.05, Calcium of 8.7, Total protein of 5.8, AST of 13, RBC of 3.70, Hgb of 11.9, HCT of 35.7, RDW of 15.8, Plt of 146K.  On review of systems, pt reports intermittent diarrhea and denies fevers, chills, night sweats, abdominal pain, constipation, and any other symptoms.    MEDICAL HISTORY:  Past Medical History:  Diagnosis Date   Cancer ([place])    GERD (gastroesophageal reflux disease)    History of kidney stones    HTN (hypertension)    Renal  disorder     SURGICAL HISTORY: Past Surgical History:  Procedure Laterality Date   CHOLECYSTECTOMY     COLONOSCOPY  01/2015   Dr. Britta Mccreedy: Mild diverticulosis, sessile polyp ranging 3 to 5 mm removed from the proximal transverse colon, semi-pedunculated polyp 5 to 9 mm in size removed from the sigmoid colon.  Sigmoid colon polyp was serrated adenoma, transverse colon polyp was adenomatous.  Patient was told to have another colonoscopy in 3 years.   COLONOSCOPY WITH PROPOFOL N/A 10/11/2017   Procedure: COLONOSCOPY WITH PROPOFOL;  Surgeon: Daneil Dolin, MD;  [facility];  Service: Endoscopy;  Laterality: N/A;  1:15pm   IR IMAGING GUIDED PORT INSERTION  06/05/2020   POLYPECTOMY  10/11/2017   Procedure: POLYPECTOMY;  Surgeon: Daneil Dolin, MD;  Location: [facility];  Service: Endoscopy;;  descending colon polyps cs times 2    SOCIAL HISTORY: Social History   Socioeconomic History   Marital status: Married    Spouse name: Not on file   Number of children: Not on file   Years of education: Not on file   Highest education level: Not on file  Occupational History   Not on file  Tobacco Use   Smoking status: Former    Packs/day: 1.00    Years: 28.00    Pack years: 28.00    Types: Cigarettes    Quit date: 10/08/2013    Years since quitting: 6.9   Smokeless tobacco: Never  Vaping Use   Vaping Use: Never used  Substance and Sexual Activity   Alcohol use: Not Currently   Drug use: Never   Sexual activity: Yes    Birth control/protection: Post-menopausal  Other Topics Concern   Not on file  Social History Narrative   Not on file   Social Determinants of Health   Financial Resource Strain: Low Risk    Difficulty of Paying Living Expenses: Not hard at all  Food Insecurity: No Food Insecurity   Worried About Charity fundraiser in the Last Year: Never true   [place] in the Last Year: Never true  Transportation Needs: No Transportation Needs   Lack of  Transportation (Medical): No   Lack of Transportation (Non-Medical): No  Physical Activity: Not on file  Stress: No Stress Concern Present   Feeling of Stress : Only a little  Social Connections: Unknown   Frequency of Communication with Friends and Family: More than three times a week   Frequency of Social Gatherings with Friends and Family: More than three times a week   Attends Religious Services: Not on file   Active Member of Clubs or Organizations: Not on file   Attends Archivist Meetings: Not on file  Marital Status: Married  Human resources officer Violence: Not on file    FAMILY HISTORY: Family History  Problem Relation Age of Onset   Heart disease Mother    Diabetes Mother    Lung cancer Father    COPD Father    Colon cancer Neg Hx     ALLERGIES:  has No Known Allergies.  MEDICATIONS:  Current Outpatient Medications  Medication Sig Dispense Refill   REVLIMID 15 MG capsule TAKE 1 CAPSULE BY MOUTH ONCE DAILY AT BEDTIME FOR 21 DAYS ON AND 7 DAYS OFF 21 capsule 0   acetaminophen (TYLENOL) 500 MG tablet Take 1,000 mg by mouth every 6 (six) hours as needed for fever or headache.     acyclovir (ZOVIRAX) 400 MG tablet TAKE 1 TABLET BY MOUTH TWICE A DAY 30 tablet 2   apixaban (ELIQUIS) 2.5 MG TABS tablet Take 1 tablet (2.5 mg total) by mouth 2 (two) times daily. 60 tablet 5   Ascorbic Acid (VITAMIN C) 1000 MG tablet Take 1,000 mg by mouth every morning.     b complex vitamins capsule Take 1 capsule by mouth daily. (Patient taking differently: Take 1 capsule by mouth every morning.) 30 capsule 3   Cholecalciferol (VITAMIN D3) 5000 units CAPS Take 5,000 Units by mouth every morning.     Cyanocobalamin (VITAMIN B12) 3000 MCG SUBL Place 3,000 mcg under the tongue every morning.     dexamethasone (DECADRON) 4 MG tablet Take 10 tablets (72m) once on day 22. Repeat every 28 days for the first 4 cycles. Take with breakfast. (Patient taking differently: Take 40 mg by mouth See  admin instructions. Take 10 tablets (440m once on day 22 after start of chemo.  Repeat every 28 days for the first 4 cycles. Take with breakfast.) 40 tablet 4   lidocaine-prilocaine (EMLA) cream Apply to affected area once (Patient taking differently: Apply 1 application topically once as needed (prior to port access).) 30 g 3   loratadine (CLARITIN) 10 MG tablet Take 10 mg by mouth every morning.     LORazepam (ATIVAN) 0.5 MG tablet Take 1 tablet (0.5 mg total) by mouth every 6 (six) hours as needed (Nausea or vomiting). (Patient not taking: Reported on 07/04/2020) 30 tablet 0   omeprazole (PRILOSEC) 40 MG capsule Take 40 mg by mouth daily.     ondansetron (ZOFRAN) 8 MG tablet Take 1 tablet (8 mg total) by mouth 2 (two) times daily as needed (Nausea or vomiting). 30 tablet 1   prochlorperazine (COMPAZINE) 10 MG tablet Take 1 tablet (10 mg total) by mouth every 6 (six) hours as needed (Nausea or vomiting). 30 tablet 1   No current facility-administered medications for this visit.    REVIEW OF SYSTEMS:   10 Point review of Systems was done is negative except as noted above.  PHYSICAL EXAMINATION: ECOG PERFORMANCE STATUS: 1 - Symptomatic but completely ambulatory  Telehealth Visit.   LABORATORY DATA:  I have reviewed the data as listed  . CBC Latest Ref Rng & Units 10/03/2020 09/26/2020 09/12/2020  WBC 4.0 - 10.5 K/uL 7.5 7.0 11.2(H)  Hemoglobin 12.0 - 15.0 g/dL 11.9(L) 12.4 12.2  Hematocrit 36.0 - 46.0 % 35.7(L) 37.4 36.5  Platelets 150 - 400 K/uL 146(L) 282 141(L)    . CMP Latest Ref Rng & Units 10/03/2020 09/26/2020 09/12/2020  Glucose 70 - 99 mg/dL 112(H) 117(H) 116(H)  BUN 6 - 20 mg/dL 24(H) 26(H) 29(H)  Creatinine 0.44 - 1.00 mg/dL 1.05(H) 0.88 0.81  Sodium 135 -  145 mmol/L 140 139 141  Potassium 3.5 - 5.1 mmol/L 3.9 4.0 3.9  Chloride 98 - 111 mmol/L 107 106 105  CO2 22 - 32 mmol/L _0 Calcium 8.9 - 10.3 mg/dL 8.7(L) 8.9 8.8(L)  Total Protein 6.5 - 8.1 g/dL 5.8(L) 6.0(L)  5.9(L)  Total Bilirubin 0.3 - 1.2 mg/dL 0.7 0.6 0.5  Alkaline Phos 38 - 126 U/L 56 63 63  AST 15 - 41 U/L 13(L) 15 18  ALT 0 - 44 U/L _1 Most recent lab results (03/15/2020) of CBC is as follows: all values are WNL except for RBC at 3.66, Hgb at 9.7, HCT at 31.8, MCHC at 30.5, RDW Standard Deviation 58.7, Red Cell Distribution Width At 18.6,  BUN at 19, Creatinine at 1.06, Total Protein at 10.2, Albumin at 2.7, AST at 14,. 03/13/2020 UPEP shows no M-Spike, all values are WNL 03/06/2020 SPEP shows Total Protein at 10.1, Beta Globulin at 5.2, M-Spike at 4.3, Total Globulin at 6.4, A/G Ratio at 0.6 03/06/2020 Free Kappa Light Chains at 181.1, Free Lambda Light Chains Serum at 3.6, K/L ratio at 50.31 03/06/2020 Beta-2 Microglobulin at 2.5           RADIOGRAPHIC STUDIES: I have personally reviewed the radiological images as listed and agreed with the findings in the report. No results found.  ASSESSMENT & PLAN:   60 year old female with  #1 Newly diagnosed multiple myeloma -IgA kappa.  Presenting with anemia hemoglobin today 9.4. Creatinine 1.28 No hypercalcemia Bone survey with no obvious skeletal lesions. Baseline M spike of 4.3 g/dL. Beta-2 Microglobulin at 2.5 Mol Cy Monosomy 13 and dup 1q   PLAN: -Discussed pt labwork, 10/03/2020; blood counts stable and chemistries stable. -Discussed of 09/12/2020 MMP-- m-protein is now down to 0.2 from original 4.7. She has had nearly 95% reduction. -Will tentatively schedule pt for cycle 6. Will hear what Dr. Amalia Hailey says regarding transplant and timing on that. -Advised pt we will fill out all needed forms for disability benefits. -Discussed staging in myeloma. -Recommended again pt receive the COVID vaccinations. The pt is declining COVID-19 vaccinations again despite unequivocal recommendations to receive this despite her high risk of complications from [place]. -Continue masking and aggressive COVID prevention  strategies. -Will keep Carfilzomib dose at 36 mg/m^2. This is the full dosage and the pt is tolerating this well. -The pt has no prohibitive toxicities from continuing C5D9 Carfilzomib / Revlimid. -Continue 2.5 mg Eliquis BID. -Will see back in C6D2.    FOLLOW UP: Plz schedule C6 of KRD as ordered. Portflush , labs on D1 ,8 and 15 of treatment MD visit with C6D2 of treatment    All of the patients questions were answered with apparent satisfaction. The patient knows to call the clinic with any problems, questions or concerns.     The total time spent in the appointment was 30 minutes and more than 50% was on counseling and direct patient cares,ordering and mx of chemotherapy     Sullivan Lone MD MS AAHIVMS [facility] Hematology/Oncology Physician [facility]  (Office):       [phone] (Work cell):  [phone] (Fax):           [phone]  10/04/2020 11:40 AM  I, Megan Velasquez, am acting as scribe for Dr. Sullivan Lone, MD. .I have reviewed the above documentation for accuracy and completeness, and I agree with the above. Brunetta Genera MD

## 2020-10-03 NOTE — Patient Instructions (Signed)
[facility] MEDICAL ONCOLOGY   Discharge Instructions: Thank you for choosing [facility] to provide your oncology and hematology care.   If you have a lab appointment with the Cancer Center, please go directly to the Cancer Center and check in at the registration area.   Wear comfortable clothing and clothing appropriate for easy access to any Portacath or PICC line.   We strive to give you quality time with your provider. You may need to reschedule your appointment if you arrive late (15 or more minutes).  Arriving late affects you and other patients whose appointments are after yours.  Also, if you miss three or more appointments without notifying the office, you may be dismissed from the clinic at the provider's discretion.      For prescription refill requests, have your pharmacy contact our office and allow 72 hours for refills to be completed.    Today you received the following chemotherapy and/or immunotherapy agents: Carfilzomib (Kyprolis)     To help prevent nausea and vomiting after your treatment, we encourage you to take your nausea medication as directed.  BELOW ARE SYMPTOMS THAT SHOULD BE REPORTED IMMEDIATELY: *FEVER GREATER THAN 100.4 F (38 C) OR HIGHER *CHILLS OR SWEATING *NAUSEA AND VOMITING THAT IS NOT CONTROLLED WITH YOUR NAUSEA MEDICATION *UNUSUAL SHORTNESS OF BREATH *UNUSUAL BRUISING OR BLEEDING *URINARY PROBLEMS (pain or burning when urinating, or frequent urination) *BOWEL PROBLEMS (unusual diarrhea, constipation, pain near the anus) TENDERNESS IN MOUTH AND THROAT WITH OR WITHOUT PRESENCE OF ULCERS (sore throat, sores in mouth, or a toothache) UNUSUAL RASH, SWELLING OR PAIN  UNUSUAL VAGINAL DISCHARGE OR ITCHING   Items with * indicate a potential emergency and should be followed up as soon as possible or go to the Emergency Department if any problems should occur.  Please show the CHEMOTHERAPY ALERT CARD or IMMUNOTHERAPY ALERT CARD  at check-in to the Emergency Department and triage nurse.  Should you have questions after your visit or need to cancel or reschedule your appointment, please contact [facility] MEDICAL ONCOLOGY  Dept: [phone]  and follow the prompts.  Office hours are 8:00 a.m. to 4:30 p.m. Monday - Friday. Please note that voicemails left after 4:00 p.m. may not be returned until the following business day.  We are closed weekends and major holidays. You have access to a nurse at all times for urgent questions. Please call the main number to the clinic Dept: [phone] and follow the prompts.   For any non-urgent questions, you may also contact your provider using MyChart. We now offer e-Visits for anyone 18 and older to request care online for non-urgent symptoms. For details visit [email or web address].   Also download the MyChart app! Go to the app store, search "MyChart", open the app, select [place], and log in with your MyChart username and password.  Due to Covid, a mask is required upon entering the hospital/clinic. If you do not have a mask, one will be given to you upon arrival. For doctor visits, patients may have 1 support person aged 18 or older with them. For treatment visits, patients cannot have anyone with them due to current Covid guidelines and our immunocompromised population.  

## 2020-10-04 ENCOUNTER — Ambulatory Visit: Payer: BC Managed Care – PPO

## 2020-10-04 ENCOUNTER — Inpatient Hospital Stay: Payer: BC Managed Care – PPO | Admitting: Hematology

## 2020-10-04 ENCOUNTER — Encounter: Payer: Self-pay | Admitting: Hematology

## 2020-10-04 ENCOUNTER — Inpatient Hospital Stay: Payer: BC Managed Care – PPO | Attending: Hematology

## 2020-10-04 VITALS — BP 136/64 | HR 90 | Temp 98.2°F | Resp 17 | Wt 142.0 lb

## 2020-10-04 DIAGNOSIS — C9000 Multiple myeloma not having achieved remission: Secondary | ICD-10-CM | POA: Diagnosis present

## 2020-10-04 DIAGNOSIS — Z5111 Encounter for antineoplastic chemotherapy: Secondary | ICD-10-CM

## 2020-10-04 DIAGNOSIS — Z7189 Other specified counseling: Secondary | ICD-10-CM

## 2020-10-04 DIAGNOSIS — Z79899 Other long term (current) drug therapy: Secondary | ICD-10-CM | POA: Diagnosis not present

## 2020-10-04 DIAGNOSIS — Z5112 Encounter for antineoplastic immunotherapy: Secondary | ICD-10-CM | POA: Insufficient documentation

## 2020-10-04 MED ORDER — HEPARIN SOD (PORK) LOCK FLUSH 100 UNIT/ML IV SOLN
500.0000 [IU] | Freq: Once | INTRAVENOUS | Status: AC | PRN
  Administered 2020-10-04: 500 [IU]
  Filled 2020-10-04: qty 5

## 2020-10-04 MED ORDER — SODIUM CHLORIDE 0.9 % IV SOLN
Freq: Once | INTRAVENOUS | Status: AC
  Filled 2020-10-04: qty 250

## 2020-10-04 MED ORDER — ACETAMINOPHEN 500 MG PO TABS
1000.0000 mg | ORAL_TABLET | Freq: Once | ORAL | Status: AC
  Administered 2020-10-04: 1000 mg via ORAL

## 2020-10-04 MED ORDER — ACETAMINOPHEN 500 MG PO TABS
ORAL_TABLET | ORAL | Status: AC
  Filled 2020-10-04: qty 2

## 2020-10-04 MED ORDER — FAMOTIDINE 20 MG PO TABS
20.0000 mg | ORAL_TABLET | Freq: Once | ORAL | Status: AC
  Administered 2020-10-04: 20 mg via ORAL

## 2020-10-04 MED ORDER — FAMOTIDINE 20 MG PO TABS
ORAL_TABLET | ORAL | Status: AC
  Filled 2020-10-04: qty 1

## 2020-10-04 MED ORDER — DIPHENHYDRAMINE HCL 25 MG PO CAPS
25.0000 mg | ORAL_CAPSULE | Freq: Once | ORAL | Status: AC
  Administered 2020-10-04: 25 mg via ORAL

## 2020-10-04 MED ORDER — DIPHENHYDRAMINE HCL 25 MG PO CAPS
ORAL_CAPSULE | ORAL | Status: AC
  Filled 2020-10-04: qty 1

## 2020-10-04 MED ORDER — SODIUM CHLORIDE 0.9% FLUSH
10.0000 mL | INTRAVENOUS | Status: DC | PRN
  Administered 2020-10-04: 10 mL
  Filled 2020-10-04: qty 10

## 2020-10-04 MED ORDER — DEXTROSE 5 % IV SOLN
36.0000 mg/m2 | Freq: Once | INTRAVENOUS | Status: AC
  Administered 2020-10-04: 60 mg via INTRAVENOUS
  Filled 2020-10-04: qty 30

## 2020-10-04 MED ORDER — SODIUM CHLORIDE 0.9 % IV SOLN
20.0000 mg | Freq: Once | INTRAVENOUS | Status: AC
  Administered 2020-10-04: 20 mg via INTRAVENOUS
  Filled 2020-10-04: qty 20

## 2020-10-04 NOTE — Patient Instructions (Signed)
[facility] MEDICAL ONCOLOGY  Discharge Instructions: Thank you for choosing [facility] to provide your oncology and hematology care.   If you have a lab appointment with the Cancer Center, please go directly to the Cancer Center and check in at the registration area.   Wear comfortable clothing and clothing appropriate for easy access to any Portacath or PICC line.   We strive to give you quality time with your provider. You may need to reschedule your appointment if you arrive late (15 or more minutes).  Arriving late affects you and other patients whose appointments are after yours.  Also, if you miss three or more appointments without notifying the office, you may be dismissed from the clinic at the provider's discretion.      For prescription refill requests, have your pharmacy contact our office and allow 72 hours for refills to be completed.    Today you received the following chemotherapy and/or immunotherapy agents: Kyprolis    To help prevent nausea and vomiting after your treatment, we encourage you to take your nausea medication as directed.  BELOW ARE SYMPTOMS THAT SHOULD BE REPORTED IMMEDIATELY: . *FEVER GREATER THAN 100.4 F (38 C) OR HIGHER . *CHILLS OR SWEATING . *NAUSEA AND VOMITING THAT IS NOT CONTROLLED WITH YOUR NAUSEA MEDICATION . *UNUSUAL SHORTNESS OF BREATH . *UNUSUAL BRUISING OR BLEEDING . *URINARY PROBLEMS (pain or burning when urinating, or frequent urination) . *BOWEL PROBLEMS (unusual diarrhea, constipation, pain near the anus) . TENDERNESS IN MOUTH AND THROAT WITH OR WITHOUT PRESENCE OF ULCERS (sore throat, sores in mouth, or a toothache) . UNUSUAL RASH, SWELLING OR PAIN  . UNUSUAL VAGINAL DISCHARGE OR ITCHING   Items with * indicate a potential emergency and should be followed up as soon as possible or go to the Emergency Department if any problems should occur.  Please show the CHEMOTHERAPY ALERT CARD or IMMUNOTHERAPY ALERT  CARD at check-in to the Emergency Department and triage nurse.  Should you have questions after your visit or need to cancel or reschedule your appointment, please contact [facility]COL[facility]  Dept: [phone]  and follow the prompts.  Office hours are 8:00 a.m. to 4:30 p.m. Monday - Friday. Please note that voicemails left after 4:00 p.m. may not be returned until the following business day.  We are closed weekends and major holidays. You have access to a nurse at all times for urgent questions. Please call the main number to the clinic Dept: [phone] and follow the prompts.   For any non-urgent questions, you may also contact your provider using MyChart. We now offer e-Visits for anyone 18 and older to request care online for non-urgent symptoms. For details visit [email or web address].   Also download the MyChart app! Go to the app store, search "MyChart", open the app, select [place], and log in with your MyChart username and password.  Due to Covid, a mask is required upon entering the hospital/clinic. If you do not have a mask, one will be given to you upon arrival. For doctor visits, patients may have 1 support person aged 18 or older with them. For treatment visits, patients cannot have anyone with them due to current Covid guidelines and our immunocompromised population.   

## 2020-10-08 ENCOUNTER — Telehealth: Payer: Self-pay | Admitting: Hematology

## 2020-10-08 ENCOUNTER — Telehealth: Payer: Self-pay | Admitting: *Deleted

## 2020-10-08 DIAGNOSIS — T451X5A Adverse effect of antineoplastic and immunosuppressive drugs, initial encounter: Secondary | ICD-10-CM | POA: Insufficient documentation

## 2020-10-08 DIAGNOSIS — J309 Allergic rhinitis, unspecified: Secondary | ICD-10-CM | POA: Insufficient documentation

## 2020-10-08 DIAGNOSIS — D801 Nonfamilial hypogammaglobulinemia: Secondary | ICD-10-CM | POA: Insufficient documentation

## 2020-10-08 NOTE — Telephone Encounter (Signed)
Faxed signed physical capacity evaluation to Ms. Thibodoaux at number noted. Fax confirmation received.

## 2020-10-08 NOTE — Telephone Encounter (Signed)
Left message with follow-up appointments per 7/1 los. 

## 2020-10-10 ENCOUNTER — Other Ambulatory Visit: Payer: Self-pay

## 2020-10-10 ENCOUNTER — Inpatient Hospital Stay: Payer: BC Managed Care – PPO

## 2020-10-10 ENCOUNTER — Ambulatory Visit: Payer: BC Managed Care – PPO

## 2020-10-10 ENCOUNTER — Other Ambulatory Visit: Payer: BC Managed Care – PPO

## 2020-10-10 ENCOUNTER — Ambulatory Visit: Payer: BC Managed Care – PPO | Admitting: Hematology

## 2020-10-10 ENCOUNTER — Encounter: Payer: Self-pay | Admitting: Hematology

## 2020-10-10 VITALS — BP 141/69 | HR 89 | Temp 98.2°F | Resp 18 | Wt 139.2 lb

## 2020-10-10 DIAGNOSIS — Z7189 Other specified counseling: Secondary | ICD-10-CM

## 2020-10-10 DIAGNOSIS — C9000 Multiple myeloma not having achieved remission: Secondary | ICD-10-CM

## 2020-10-10 DIAGNOSIS — Z95828 Presence of other vascular implants and grafts: Secondary | ICD-10-CM

## 2020-10-10 LAB — CBC WITH DIFFERENTIAL/PLATELET
Abs Immature Granulocytes: 0.28 10*3/uL — ABNORMAL HIGH (ref 0.00–0.07)
Basophils Absolute: 0.1 10*3/uL (ref 0.0–0.1)
Basophils Relative: 1 %
Eosinophils Absolute: 0.5 10*3/uL (ref 0.0–0.5)
Eosinophils Relative: 5 %
HCT: 35 % — ABNORMAL LOW (ref 36.0–46.0)
Hemoglobin: 11.6 g/dL — ABNORMAL LOW (ref 12.0–15.0)
Immature Granulocytes: 3 %
Lymphocytes Relative: 19 %
Lymphs Abs: 1.7 10*3/uL (ref 0.7–4.0)
MCH: 32.1 pg (ref 26.0–34.0)
MCHC: 33.1 g/dL (ref 30.0–36.0)
MCV: 97 fL (ref 80.0–100.0)
Monocytes Absolute: 1.1 10*3/uL — ABNORMAL HIGH (ref 0.1–1.0)
Monocytes Relative: 13 %
Neutro Abs: 5.2 10*3/uL (ref 1.7–7.7)
Neutrophils Relative %: 59 %
Platelets: 149 10*3/uL — ABNORMAL LOW (ref 150–400)
RBC: 3.61 MIL/uL — ABNORMAL LOW (ref 3.87–5.11)
RDW: 15.9 % — ABNORMAL HIGH (ref 11.5–15.5)
WBC: 8.7 10*3/uL (ref 4.0–10.5)
nRBC: 0 % (ref 0.0–0.2)

## 2020-10-10 LAB — CMP (CANCER CENTER ONLY)
ALT: 16 U/L (ref 0–44)
AST: 18 U/L (ref 15–41)
Albumin: 3.6 g/dL (ref 3.5–5.0)
Alkaline Phosphatase: 58 U/L (ref 38–126)
Anion gap: 10 (ref 5–15)
BUN: 15 mg/dL (ref 6–20)
CO2: 24 mmol/L (ref 22–32)
Calcium: 8.9 mg/dL (ref 8.9–10.3)
Chloride: 104 mmol/L (ref 98–111)
Creatinine: 0.81 mg/dL (ref 0.44–1.00)
GFR, Estimated: >60 mL/min (ref >60)
Glucose, Bld: 105 mg/dL — ABNORMAL HIGH (ref 70–99)
Potassium: 3.8 mmol/L (ref 3.5–5.1)
Sodium: 138 mmol/L (ref 135–145)
Total Bilirubin: 0.8 mg/dL (ref 0.3–1.2)
Total Protein: 5.9 g/dL — ABNORMAL LOW (ref 6.5–8.1)

## 2020-10-10 MED ORDER — ACETAMINOPHEN 500 MG PO TABS
ORAL_TABLET | ORAL | Status: AC
  Filled 2020-10-10: qty 2

## 2020-10-10 MED ORDER — HEPARIN SOD (PORK) LOCK FLUSH 100 UNIT/ML IV SOLN
500.0000 [IU] | Freq: Once | INTRAVENOUS | Status: AC | PRN
  Administered 2020-10-10: 500 [IU]
  Filled 2020-10-10: qty 5

## 2020-10-10 MED ORDER — SODIUM CHLORIDE 0.9 % IV SOLN
Freq: Once | INTRAVENOUS | Status: DC
  Filled 2020-10-10: qty 250

## 2020-10-10 MED ORDER — SODIUM CHLORIDE 0.9% FLUSH
10.0000 mL | Freq: Once | INTRAVENOUS | Status: AC
  Administered 2020-10-10: 10 mL
  Filled 2020-10-10: qty 10

## 2020-10-10 MED ORDER — DIPHENHYDRAMINE HCL 25 MG PO CAPS
ORAL_CAPSULE | ORAL | Status: AC
  Filled 2020-10-10: qty 1

## 2020-10-10 MED ORDER — SODIUM CHLORIDE 0.9 % IV SOLN
Freq: Once | INTRAVENOUS | Status: AC
  Filled 2020-10-10: qty 250

## 2020-10-10 MED ORDER — ACETAMINOPHEN 500 MG PO TABS
1000.0000 mg | ORAL_TABLET | Freq: Once | ORAL | Status: AC
  Administered 2020-10-10: 1000 mg via ORAL

## 2020-10-10 MED ORDER — DIPHENHYDRAMINE HCL 25 MG PO CAPS
25.0000 mg | ORAL_CAPSULE | Freq: Once | ORAL | Status: AC
  Administered 2020-10-10: 25 mg via ORAL

## 2020-10-10 MED ORDER — FAMOTIDINE 20 MG PO TABS
ORAL_TABLET | ORAL | Status: AC
  Filled 2020-10-10: qty 1

## 2020-10-10 MED ORDER — SODIUM CHLORIDE 0.9 % IV SOLN
20.0000 mg | Freq: Once | INTRAVENOUS | Status: AC
  Administered 2020-10-10: 20 mg via INTRAVENOUS
  Filled 2020-10-10: qty 20

## 2020-10-10 MED ORDER — DEXTROSE 5 % IV SOLN
36.0000 mg/m2 | Freq: Once | INTRAVENOUS | Status: AC
  Administered 2020-10-10: 60 mg via INTRAVENOUS
  Filled 2020-10-10: qty 30

## 2020-10-10 MED ORDER — SODIUM CHLORIDE 0.9% FLUSH
10.0000 mL | INTRAVENOUS | Status: DC | PRN
  Administered 2020-10-10: 10 mL
  Filled 2020-10-10: qty 10

## 2020-10-10 MED ORDER — FAMOTIDINE 20 MG PO TABS
20.0000 mg | ORAL_TABLET | Freq: Once | ORAL | Status: AC
  Administered 2020-10-10: 20 mg via ORAL

## 2020-10-10 MED ORDER — DEXTROSE 5 % IV SOLN: 36.0000 mg/m2 | Freq: Once | INTRAVENOUS | Status: DC

## 2020-10-10 NOTE — Patient Instructions (Signed)
[facility] MEDICAL [facility]COLOGY  Discharge Instructions: Thank you for choosing [facility] to provide your oncology and hematology care.   If you have a lab appointment with the Cancer Center, please go directly to the Cancer Center and check in at the registration area.   Wear comfortable clothing and clothing appropriate for easy access to any Portacath or PICC line.   We strive to give you quality time with your provider. You may need to reschedule your appointment if you arrive late (15 or more minutes).  Arriving late affects you and other patients whose appointments are after yours.  Also, if you miss three or more appointments without notifying the office, you may be dismissed from the clinic at the provider's discretion.      For prescription refill requests, have your pharmacy contact our office and allow 72 hours for refills to be completed.    Today you received the following chemotherapy and/or immunotherapy agents: Kyprolis    To help prevent nausea and vomiting after your treatment, we encourage you to take your nausea medication as directed.  BELOW ARE SYMPTOMS THAT SHOULD BE REPORTED IMMEDIATELY: . *FEVER GREATER THAN 100.4 F (38 C) OR HIGHER . *CHILLS OR SWEATING . *NAUSEA AND VOMITING THAT IS NOT CONTROLLED WITH YOUR NAUSEA MEDICATION . *UNUSUAL SHORTNESS OF BREATH . *UNUSUAL BRUISING OR BLEEDING . *URINARY PROBLEMS (pain or burning when urinating, or frequent urination) . *BOWEL PROBLEMS (unusual diarrhea, constipation, pain near the anus) . TENDERNESS IN MOUTH AND THROAT WITH OR WITHOUT PRESENCE OF ULCERS (sore throat, sores in mouth, or a toothache) . UNUSUAL RASH, SWELLING OR PAIN  . UNUSUAL VAGINAL DISCHARGE OR ITCHING   Items with * indicate a potential emergency and should be followed up as soon as possible or go to the Emergency Department if any problems should occur.  Please show the CHEMOTHERAPY ALERT CARD or IMMUNOTHERAPY ALERT  CARD at check-in to the Emergency Department and triage nurse.  Should you have questions after your visit or need to cancel or reschedule your appointment, please contact [facility]COL[facility]  Dept: [phone]  and follow the prompts.  Office hours are 8:00 a.m. to 4:30 p.m. Monday - Friday. Please note that voicemails left after 4:00 p.m. may not be returned until the following business day.  We are closed weekends and major holidays. You have access to a nurse at all times for urgent questions. Please call the main number to the clinic Dept: [phone] and follow the prompts.   For any non-urgent questions, you may also contact your provider using MyChart. We now offer e-Visits for anyone 18 and older to request care online for non-urgent symptoms. For details visit [email or web address].   Also download the MyChart app! Go to the app store, search "MyChart", open the app, select [place], and log in with your MyChart username and password.  Due to Covid, a mask is required upon entering the hospital/clinic. If you do not have a mask, one will be given to you upon arrival. For doctor visits, patients may have 1 support person aged 18 or older with them. For treatment visits, patients cannot have anyone with them due to current Covid guidelines and our immunocompromised population.   

## 2020-10-11 ENCOUNTER — Ambulatory Visit: Payer: BC Managed Care – PPO

## 2020-10-11 ENCOUNTER — Inpatient Hospital Stay: Payer: BC Managed Care – PPO

## 2020-10-11 VITALS — BP 143/63 | HR 87 | Temp 98.6°F | Resp 20 | Wt 141.8 lb

## 2020-10-11 DIAGNOSIS — C9000 Multiple myeloma not having achieved remission: Secondary | ICD-10-CM

## 2020-10-11 DIAGNOSIS — Z7189 Other specified counseling: Secondary | ICD-10-CM

## 2020-10-11 MED ORDER — DEXTROSE 5 % IV SOLN
36.0000 mg/m2 | Freq: Once | INTRAVENOUS | Status: AC
  Administered 2020-10-11: 60 mg via INTRAVENOUS
  Filled 2020-10-11: qty 30

## 2020-10-11 MED ORDER — SODIUM CHLORIDE 0.9 % IV SOLN
Freq: Once | INTRAVENOUS | Status: AC
  Filled 2020-10-11: qty 250

## 2020-10-11 MED ORDER — FAMOTIDINE 20 MG PO TABS
ORAL_TABLET | ORAL | Status: AC
  Filled 2020-10-11: qty 1

## 2020-10-11 MED ORDER — SODIUM CHLORIDE 0.9% FLUSH
10.0000 mL | INTRAVENOUS | Status: DC | PRN
  Administered 2020-10-11: 10 mL
  Filled 2020-10-11: qty 10

## 2020-10-11 MED ORDER — ACETAMINOPHEN 325 MG PO TABS
ORAL_TABLET | ORAL | Status: AC
  Filled 2020-10-11: qty 2

## 2020-10-11 MED ORDER — SODIUM CHLORIDE 0.9 % IV SOLN
20.0000 mg | Freq: Once | INTRAVENOUS | Status: AC
  Administered 2020-10-11: 20 mg via INTRAVENOUS
  Filled 2020-10-11: qty 20

## 2020-10-11 MED ORDER — ACETAMINOPHEN 500 MG PO TABS
ORAL_TABLET | ORAL | Status: AC
  Filled 2020-10-11: qty 2

## 2020-10-11 MED ORDER — DIPHENHYDRAMINE HCL 25 MG PO CAPS
ORAL_CAPSULE | ORAL | Status: AC
  Filled 2020-10-11: qty 1

## 2020-10-11 MED ORDER — ACETAMINOPHEN 500 MG PO TABS
1000.0000 mg | ORAL_TABLET | Freq: Once | ORAL | Status: AC
  Administered 2020-10-11: 1000 mg via ORAL

## 2020-10-11 MED ORDER — DIPHENHYDRAMINE HCL 25 MG PO CAPS
25.0000 mg | ORAL_CAPSULE | Freq: Once | ORAL | Status: AC
  Administered 2020-10-11: 25 mg via ORAL

## 2020-10-11 MED ORDER — HEPARIN SOD (PORK) LOCK FLUSH 100 UNIT/ML IV SOLN
500.0000 [IU] | Freq: Once | INTRAVENOUS | Status: AC | PRN
  Administered 2020-10-11: 500 [IU]
  Filled 2020-10-11: qty 5

## 2020-10-11 MED ORDER — FAMOTIDINE 20 MG PO TABS
20.0000 mg | ORAL_TABLET | Freq: Once | ORAL | Status: AC
  Administered 2020-10-11: 20 mg via ORAL

## 2020-10-11 NOTE — Patient Instructions (Signed)
[facility]  Discharge Instructions: Thank you for choosing [facility] to provide your oncology and hematology care.   If you have a lab appointment with the Cancer Center, please go directly to the Cancer Center and check in at the registration area.   Wear comfortable clothing and clothing appropriate for easy access to any Portacath or PICC line.   We strive to give you quality time with your provider. You may need to reschedule your appointment if you arrive late (15 or more minutes).  Arriving late affects you and other patients whose appointments are after yours.  Also, if you miss three or more appointments without notifying the office, you may be dismissed from the clinic at the provider's discretion.      For prescription refill requests, have your pharmacy contact our office and allow 72 hours for refills to be completed.    Today you received the following chemotherapy and/or immunotherapy agent: Carfilzomib (Kyprolis).    To help prevent nausea and vomiting after your treatment, we encourage you to take your nausea medication as directed.  BELOW ARE SYMPTOMS THAT SHOULD BE REPORTED IMMEDIATELY: *FEVER GREATER THAN 100.4 F (38 C) OR HIGHER *CHILLS OR SWEATING *NAUSEA AND VOMITING THAT IS NOT CONTROLLED WITH YOUR NAUSEA MEDICATION *UNUSUAL SHORTNESS OF BREATH *UNUSUAL BRUISING OR BLEEDING *URINARY PROBLEMS (pain or burning when urinating, or frequent urination) *BOWEL PROBLEMS (unusual diarrhea, constipation, pain near the anus) TENDERNESS IN MOUTH AND THROAT WITH OR WITHOUT PRESENCE OF ULCERS (sore throat, sores in mouth, or a toothache) UNUSUAL RASH, SWELLING OR PAIN  UNUSUAL VAGINAL DISCHARGE OR ITCHING   Items with * indicate a potential emergency and should be followed up as soon as possible or go to the Emergency Department if any problems should occur.  Please show the CHEMOTHERAPY ALERT CARD or IMMUNOTHERAPY ALERT CARD at  check-in to the Emergency Department and triage nurse.  Should you have questions after your visit or need to cancel or reschedule your appointment, please contact [facility]  Dept: [phone]  and follow the prompts.  Office hours are 8:00 a.m. to 4:30 p.m. Monday - Friday. Please note that voicemails left after 4:00 p.m. may not be returned until the following business day.  We are closed weekends and major holidays. You have access to a nurse at all times for urgent questions. Please call the main number to the clinic Dept: [phone] and follow the prompts.   For any non-urgent questions, you may also contact your provider using MyChart. We now offer e-Visits for anyone 18 and older to request care online for non-urgent symptoms. For details visit [email or web address].   Also download the MyChart app! Go to the app store, search "MyChart", open the app, select , and log in with your MyChart username and password.  Due to Covid, a mask is required upon entering the hospital/clinic. If you do not have a mask, one will be given to you upon arrival. For doctor visits, patients may have 1 support person aged 18 or older with them. For treatment visits, patients cannot have anyone with them due to current Covid guidelines and our immunocompromised population.   

## 2020-10-15 ENCOUNTER — Other Ambulatory Visit: Payer: Self-pay | Admitting: Hematology

## 2020-10-15 ENCOUNTER — Other Ambulatory Visit: Payer: Self-pay

## 2020-10-15 DIAGNOSIS — C9000 Multiple myeloma not having achieved remission: Secondary | ICD-10-CM

## 2020-10-15 DIAGNOSIS — Z7189 Other specified counseling: Secondary | ICD-10-CM

## 2020-10-16 NOTE — Progress Notes (Signed)
Pt called to report PCP was concerned about pt's high B12 level. PCP wanted pt to check with DR Irene Limbo about possibly stopping oral B12. Per Dr Irene Limbo pt to continue B12 but to cut back to 3 times a week. Pt needs B12 due to possible neuropathy side effect of current chemo regimen. Pt verbalized understanding.

## 2020-10-17 ENCOUNTER — Other Ambulatory Visit: Payer: Self-pay | Admitting: Pharmacist

## 2020-10-17 DIAGNOSIS — Z7189 Other specified counseling: Secondary | ICD-10-CM

## 2020-10-17 DIAGNOSIS — C9000 Multiple myeloma not having achieved remission: Secondary | ICD-10-CM

## 2020-10-17 MED ORDER — REVLIMID 15 MG PO CAPS: ORAL_CAPSULE | ORAL | 0 refills | Status: DC

## 2020-10-17 NOTE — Progress Notes (Signed)
Oral Oncology Pharmacist Encounter  Received call from [facility] that they can no longer fill prescription for Revlimid for patient due to change in insurance. Prescription redirected to [facility]. New insurance information shared with pharmacy.   Leron Croak, PharmD, BCPS Hematology/Oncology Clinical Pharmacist [facility] [phone] 10/17/2020 3:42 PM

## 2020-10-21 ENCOUNTER — Other Ambulatory Visit: Payer: Self-pay

## 2020-10-21 ENCOUNTER — Other Ambulatory Visit: Payer: Self-pay | Admitting: Pharmacist

## 2020-10-21 DIAGNOSIS — Z7189 Other specified counseling: Secondary | ICD-10-CM

## 2020-10-21 DIAGNOSIS — C9000 Multiple myeloma not having achieved remission: Secondary | ICD-10-CM

## 2020-10-21 MED ORDER — REVLIMID 15 MG PO CAPS: ORAL_CAPSULE | ORAL | 0 refills | Status: DC

## 2020-10-21 NOTE — Progress Notes (Signed)
Oral Oncology Pharmacist Encounter  Notified by [facility] that they are not contracted to fill patient's Revlimid Prescription. Patient's insurance requires Revlimid now be filled through [facility]. Pharmacy added to patient's profile and prescription redirected to [facility] for dispensing.  Leron Croak, PharmD, BCPS Hematology/Oncology Clinical Pharmacist [facility] [phone] 10/21/2020 9:48 AM

## 2020-10-24 ENCOUNTER — Telehealth: Payer: Self-pay

## 2020-10-24 ENCOUNTER — Inpatient Hospital Stay: Payer: BC Managed Care – PPO

## 2020-10-24 ENCOUNTER — Other Ambulatory Visit: Payer: Self-pay

## 2020-10-24 ENCOUNTER — Encounter: Payer: Self-pay | Admitting: Hematology

## 2020-10-24 ENCOUNTER — Other Ambulatory Visit (HOSPITAL_COMMUNITY): Payer: Self-pay

## 2020-10-24 ENCOUNTER — Other Ambulatory Visit: Payer: Self-pay | Admitting: Hematology

## 2020-10-24 VITALS — BP 148/70 | HR 93 | Temp 97.8°F | Resp 18 | Wt 135.5 lb

## 2020-10-24 DIAGNOSIS — C9000 Multiple myeloma not having achieved remission: Secondary | ICD-10-CM | POA: Diagnosis not present

## 2020-10-24 DIAGNOSIS — Z7189 Other specified counseling: Secondary | ICD-10-CM

## 2020-10-24 DIAGNOSIS — Z95828 Presence of other vascular implants and grafts: Secondary | ICD-10-CM

## 2020-10-24 LAB — CBC WITH DIFFERENTIAL/PLATELET
Abs Immature Granulocytes: 0.09 10*3/uL — ABNORMAL HIGH (ref 0.00–0.07)
Basophils Absolute: 0.1 10*3/uL (ref 0.0–0.1)
Basophils Relative: 1 %
Eosinophils Absolute: 0.1 10*3/uL (ref 0.0–0.5)
Eosinophils Relative: 1 %
HCT: 38.8 % (ref 36.0–46.0)
Hemoglobin: 13 g/dL (ref 12.0–15.0)
Immature Granulocytes: 1 %
Lymphocytes Relative: 20 %
Lymphs Abs: 1.4 10*3/uL (ref 0.7–4.0)
MCH: 32.8 pg (ref 26.0–34.0)
MCHC: 33.5 g/dL (ref 30.0–36.0)
MCV: 98 fL (ref 80.0–100.0)
Monocytes Absolute: 0.9 10*3/uL (ref 0.1–1.0)
Monocytes Relative: 13 %
Neutro Abs: 4.5 10*3/uL (ref 1.7–7.7)
Neutrophils Relative %: 64 %
Platelets: 262 10*3/uL (ref 150–400)
RBC: 3.96 MIL/uL (ref 3.87–5.11)
RDW: 15 % (ref 11.5–15.5)
WBC: 7.1 10*3/uL (ref 4.0–10.5)
nRBC: 0 % (ref 0.0–0.2)

## 2020-10-24 LAB — CMP (CANCER CENTER ONLY)
ALT: 16 U/L (ref 0–44)
AST: 18 U/L (ref 15–41)
Albumin: 4 g/dL (ref 3.5–5.0)
Alkaline Phosphatase: 53 U/L (ref 38–126)
Anion gap: 7 (ref 5–15)
BUN: 20 mg/dL (ref 6–20)
CO2: 27 mmol/L (ref 22–32)
Calcium: 9 mg/dL (ref 8.9–10.3)
Chloride: 104 mmol/L (ref 98–111)
Creatinine: 0.89 mg/dL (ref 0.44–1.00)
GFR, Estimated: >60 mL/min (ref >60)
Glucose, Bld: 106 mg/dL — ABNORMAL HIGH (ref 70–99)
Potassium: 3.9 mmol/L (ref 3.5–5.1)
Sodium: 138 mmol/L (ref 135–145)
Total Bilirubin: 0.8 mg/dL (ref 0.3–1.2)
Total Protein: 6.1 g/dL — ABNORMAL LOW (ref 6.5–8.1)

## 2020-10-24 MED ORDER — HEPARIN SOD (PORK) LOCK FLUSH 100 UNIT/ML IV SOLN
500.0000 [IU] | Freq: Once | INTRAVENOUS | Status: DC | PRN
  Filled 2020-10-24: qty 5

## 2020-10-24 MED ORDER — DIPHENHYDRAMINE HCL 25 MG PO CAPS
ORAL_CAPSULE | ORAL | Status: AC
  Filled 2020-10-24: qty 1

## 2020-10-24 MED ORDER — SODIUM CHLORIDE 0.9 % IV SOLN
20.0000 mg | Freq: Once | INTRAVENOUS | Status: AC
  Administered 2020-10-24: 20 mg via INTRAVENOUS
  Filled 2020-10-24: qty 20

## 2020-10-24 MED ORDER — FAMOTIDINE 20 MG PO TABS
ORAL_TABLET | ORAL | Status: AC
  Filled 2020-10-24: qty 1

## 2020-10-24 MED ORDER — SODIUM CHLORIDE 0.9 % IV SOLN
Freq: Once | INTRAVENOUS | Status: AC
  Filled 2020-10-24: qty 250

## 2020-10-24 MED ORDER — FAMOTIDINE 20 MG PO TABS
20.0000 mg | ORAL_TABLET | Freq: Once | ORAL | Status: AC
  Administered 2020-10-24: 20 mg via ORAL

## 2020-10-24 MED ORDER — ACETAMINOPHEN 500 MG PO TABS
ORAL_TABLET | ORAL | Status: AC
  Filled 2020-10-24: qty 2

## 2020-10-24 MED ORDER — SODIUM CHLORIDE 0.9% FLUSH
10.0000 mL | Freq: Once | INTRAVENOUS | Status: AC
  Administered 2020-10-24: 10 mL
  Filled 2020-10-24: qty 10

## 2020-10-24 MED ORDER — DEXTROSE 5 % IV SOLN
36.0000 mg/m2 | Freq: Once | INTRAVENOUS | Status: AC
  Administered 2020-10-24: 60 mg via INTRAVENOUS
  Filled 2020-10-24: qty 30

## 2020-10-24 MED ORDER — ACETAMINOPHEN 500 MG PO TABS
1000.0000 mg | ORAL_TABLET | Freq: Once | ORAL | Status: AC
  Administered 2020-10-24: 1000 mg via ORAL

## 2020-10-24 MED ORDER — SODIUM CHLORIDE 0.9% FLUSH
10.0000 mL | INTRAVENOUS | Status: DC | PRN
  Filled 2020-10-24: qty 10

## 2020-10-24 MED ORDER — DIPHENHYDRAMINE HCL 25 MG PO CAPS
25.0000 mg | ORAL_CAPSULE | Freq: Once | ORAL | Status: AC
  Administered 2020-10-24: 25 mg via ORAL

## 2020-10-24 NOTE — Progress Notes (Signed)
HEMATOLOGY/ONCOLOGY CONSULTATION NOTE  Date of Service: 10/24/2020  Patient Care Team: Megan Kaufmann, MD as PCP - General (Family Medicine) Megan Velasquez, [name], MD as PCP - Cardiology (Cardiology) Megan Romney [name], MD as Consulting Physician (Gastroenterology)  CHIEF COMPLAINTS/PURPOSE OF CONSULTATION:  Multiple Myeloma  HISTORY OF PRESENTING ILLNESS:   Megan Velasquez is a wonderful 60 y.o. female who has been referred to [place] for evaluation and management of multiple myeloma. Pt is accompanied today by her husband for this visit.    Patient was referred to [place] for second opinion regarding management of newly diagnosed multiple myeloma by Dr. Laurena Slimmer MD.  Patient had been referred to to [facility] cancer center by her primary care physician for anemia and elevated serum protein levels.  She also had some increasing fatigue and tingling in her hands and feet.  Noted primarily in the right wrist and right ankle.  B12 levels were noted to be borderline low.  She had a work-up including labs which showed hemoglobin of 9.7 with an MCV of 86.9 WBC count of 6.95k and platelets of 165k. CMP showed a creatinine of 1.06 calcium level of 8.9 total protein of 10.2 with an albumin of 2.7 normal liver function tests and other electrolytes. Serum protein electrophoresis with an M spike of 4.3 with an additional M spike of 0.2 g/dL.  IgA quantitative more than 6400. B12 levels 276, homocystine 15.9 Serum folate 26.5 LDH 96 Beta-2 microglobulin of 2.5  Serum kappa lambda free light chains showed free kappa light chains of 181, free lambda light chains of 3.6 and a free kappa lambda ratio of 50.3.  24-hour UPEP showed no M spike and a total protein of 5.7 mg/dL.  Bone Survey completed on 03/08/2020 with results revealing "No evidence of lytic nor blastic lesions within the appendicular or axial skeleton. Degenerative changes as described above."   On review of systems, pt reports some mild fatigue.  No  focal bone pains.  No chest pain.  No shortness of breath. No evidence of GI bleeding no hematuria no nosebleeds or gum bleeds. No other acute new focal symptoms. Denies any history of known cardiopulmonary disease strokes or heart attacks.  INTERVAL HISTORY  Megan Velasquez is a wonderful 59 y.o. female who is here today for evaluation and management of multiple myeloma. The patient's last visit with [place] was on 10/04/2020. The pt reports that she is doing well overall. She is here for toxicity check prior to [place].  The pt reports no acute new symptoms. She has been evaluated at [facility] by Dr Amalia Hailey and offered option for transplant,  Mild intermittent hand cramping from Revlimid.  Still disinclined to consider Covid 19 vaccine and Evusheld.  Lab results today 10/25/2020 of CBC w/diff and CMP -reviewed wth patient . Has had VGPR.  On review of systems, pt reports no infection issues or other acute issues.   MEDICAL HISTORY:  Past Medical History:  Diagnosis Date   Cancer ([facility])    GERD (gastroesophageal reflux disease)    History of kidney stones    HTN (hypertension)    Renal disorder     SURGICAL HISTORY: Past Surgical History:  Procedure Laterality Date   CHOLECYSTECTOMY     COLONOSCOPY  01/2015   Dr. Britta Mccreedy: Mild diverticulosis, sessile polyp ranging 3 to 5 mm removed from the proximal transverse colon, semi-pedunculated polyp 5 to 9 mm in size removed from the sigmoid colon.  Sigmoid colon polyp was serrated  adenoma, transverse colon polyp was adenomatous.  Patient was told to have another colonoscopy in 3 years.   COLONOSCOPY WITH PROPOFOL N/A 10/11/2017   Procedure: COLONOSCOPY WITH PROPOFOL;  Surgeon: Daneil Dolin, MD;  [facility]: [facility];  Service: Endoscopy;  Laterality: N/A;  1:15pm   IR IMAGING GUIDED PORT INSERTION  06/05/2020   POLYPECTOMY  10/11/2017   Procedure: POLYPECTOMY;  Surgeon: Daneil Dolin, MD;  Location: [facility];  Service:  Endoscopy;;  descending colon polyps cs times 2    SOCIAL HISTORY: Social History   Socioeconomic History   Marital status: Married    Spouse name: Not on file   Number of children: Not on file   Years of education: Not on file   Highest education level: Not on file  Occupational History   Not on file  Tobacco Use   Smoking status: Former    Packs/day: 1.00    Years: 28.00    Pack years: 28.00    Types: Cigarettes    Quit date: 10/08/2013    Years since quitting: 7.0   Smokeless tobacco: Never  Vaping Use   Vaping Use: Never used  Substance and Sexual Activity   Alcohol use: Not Currently   Drug use: Never   Sexual activity: Yes    Birth control/protection: Post-menopausal  Other Topics Concern   Not on file  Social History Narrative   Not on file   Social Determinants of Health   Financial Resource Strain: Low Risk    Difficulty of Paying Living Expenses: Not hard at all  Food Insecurity: No Food Insecurity   Worried About Charity fundraiser in the Last Year: Never true   [place] in the Last Year: Never true  Transportation Needs: No Transportation Needs   Lack of Transportation (Medical): No   Lack of Transportation (Non-Medical): No  Physical Activity: Not on file  Stress: No Stress Concern Present   Feeling of Stress : Only a little  Social Connections: Unknown   Frequency of Communication with Friends and Family: More than three times a week   Frequency of Social Gatherings with Friends and Family: More than three times a week   Attends Religious Services: Not on Electrical engineer or Organizations: Not on file   Attends Archivist Meetings: Not on file   Marital Status: Married  Human resources officer Violence: Not on file    FAMILY HISTORY: Family History  Problem Relation Age of Onset   Heart disease Mother    Diabetes Mother    Lung cancer Father    COPD Father    Colon cancer Neg Hx     ALLERGIES:  has No Known  Allergies.  MEDICATIONS:  Current Outpatient Medications  Medication Sig Dispense Refill   acetaminophen (TYLENOL) 500 MG tablet Take 1,000 mg by mouth every 6 (six) hours as needed for fever or headache.     acyclovir (ZOVIRAX) 400 MG tablet TAKE 1 TABLET BY MOUTH TWICE A DAY 30 tablet 2   apixaban (ELIQUIS) 2.5 MG TABS tablet Take 1 tablet (2.5 mg total) by mouth 2 (two) times daily. 60 tablet 5   Ascorbic Acid (VITAMIN C) 1000 MG tablet Take 1,000 mg by mouth every morning.     b complex vitamins capsule Take 1 capsule by mouth daily. (Patient taking differently: Take 1 capsule by mouth every morning.) 30 capsule 3   Cholecalciferol (VITAMIN D3) 5000 units CAPS Take  5,000 Units by mouth every morning.     Cyanocobalamin (VITAMIN B12) 3000 MCG SUBL Place 3,000 mcg under the tongue every morning.     dexamethasone (DECADRON) 4 MG tablet Take 10 tablets (5m) once on day 22. Repeat every 28 days for the first 4 cycles. Take with breakfast. (Patient taking differently: Take 40 mg by mouth See admin instructions. Take 10 tablets (444m once on day 22 after start of chemo.  Repeat every 28 days for the first 4 cycles. Take with breakfast.) 40 tablet 4   lidocaine-prilocaine (EMLA) cream Apply to affected area once (Patient taking differently: Apply 1 application topically once as needed (prior to port access).) 30 g 3   loratadine (CLARITIN) 10 MG tablet Take 10 mg by mouth every morning.     LORazepam (ATIVAN) 0.5 MG tablet Take 1 tablet (0.5 mg total) by mouth every 6 (six) hours as needed (Nausea or vomiting). (Patient not taking: Reported on 07/04/2020) 30 tablet 0   omeprazole (PRILOSEC) 40 MG capsule Take 40 mg by mouth daily.     ondansetron (ZOFRAN) 8 MG tablet Take 1 tablet (8 mg total) by mouth 2 (two) times daily as needed (Nausea or vomiting). 30 tablet 1   prochlorperazine (COMPAZINE) 10 MG tablet Take 1 tablet (10 mg total) by mouth every 6 (six) hours as needed (Nausea or vomiting). 30  tablet 1   REVLIMID 15 MG capsule TAKE 1 CAPSULE BY MOUTH ONCE DAILY AT BEDTIME FOR 21 DAYS ON AND 7 DAYS OFF 21 capsule 0   No current facility-administered medications for this visit.    REVIEW OF SYSTEMS:   10 Point review of Systems was done is negative except as noted above.  PHYSICAL EXAMINATION: ECOG PERFORMANCE STATUS: 1 - Symptomatic but completely ambulatory  NAD GENERAL:alert, in no acute distress and comfortable SKIN: no acute rashes, no significant lesions EYES: conjunctiva are pink and non-injected, sclera anicteric OROPHARYNX: MMM, no exudates, no oropharyngeal erythema or ulceration NECK: supple, no JVD LYMPH:  no palpable lymphadenopathy in the cervical, axillary or inguinal regions LUNGS: clear to auscultation b/l with normal respiratory effort HEART: regular rate & rhythm ABDOMEN:  normoactive bowel sounds , non tender, not distended. Extremity: no pedal edema PSYCH: alert & oriented x 3 with fluent speech NEURO: no focal motor/sensory deficits   LABORATORY DATA:  I have reviewed the data as listed  . CBC Latest Ref Rng & Units 10/24/2020 10/10/2020 10/03/2020  WBC 4.0 - 10.5 K/uL 7.1 8.7 7.5  Hemoglobin 12.0 - 15.0 g/dL 13.0 11.6(L) 11.9(L)  Hematocrit 36.0 - 46.0 % 38.8 35.0(L) 35.7(L)  Platelets 150 - 400 K/uL 262 149(L) 146(L)    . CMP Latest Ref Rng & Units 10/24/2020 10/10/2020 10/03/2020  Glucose 70 - 99 mg/dL 106(H) 105(H) 112(H)  BUN 6 - 20 mg/dL 20 15 24(H)  Creatinine 0.44 - 1.00 mg/dL 0.89 0.81 1.05(H)  Sodium 135 - 145 mmol/L 138 138 140  Potassium 3.5 - 5.1 mmol/L 3.9 3.8 3.9  Chloride 98 - 111 mmol/L 104 104 107  CO2 22 - 32 mmol/L _0 Calcium 8.9 - 10.3 mg/dL 9.0 8.9 8.7(L)  Total Protein 6.5 - 8.1 g/dL 6.1(L) 5.9(L) 5.8(L)  Total Bilirubin 0.3 - 1.2 mg/dL 0.8 0.8 0.7  Alkaline Phos 38 - 126 U/L 53 58 56  AST 15 - 41 U/L 18 18 13(L)  ALT 0 - 44 U/L _1 Most recent lab results (03/15/2020) of CBC  is as follows: all  values are WNL except for RBC at 3.66, Hgb at 9.7, HCT at 31.8, MCHC at 30.5, RDW Standard Deviation 58.7, Red Cell Distribution Width At 18.6,  BUN at 19, Creatinine at 1.06, Total Protein at 10.2, Albumin at 2.7, AST at 14,. 03/13/2020 UPEP shows no M-Spike, all values are WNL 03/06/2020 SPEP shows Total Protein at 10.1, Beta Globulin at 5.2, M-Spike at 4.3, Total Globulin at 6.4, A/G Ratio at 0.6 03/06/2020 Free Kappa Light Chains at 181.1, Free Lambda Light Chains Serum at 3.6, K/L ratio at 50.31 03/06/2020 Beta-2 Microglobulin at 2.5           RADIOGRAPHIC STUDIES: I have personally reviewed the radiological images as listed and agreed with the findings in the report. No results found.  ASSESSMENT & PLAN:   60 year old female with  #1 Multiple myeloma -IgA kappa.  Presenting with anemia hemoglobin 9.4. Creatinine 1.28 No hypercalcemia Bone survey with no obvious skeletal lesions. Baseline M spike of 4.3 g/dL. Beta-2 Microglobulin at 2.5 Mol Cy Monosomy 13 and dup 1q   PLAN: -Discussed pt labwork today, 10/25/2020; anemia resolved hgb 12, creatinine 1.06, no hypercalcemia --Continue masking and aggressive COVID prevention strategies. -Will keep Carfilzomib dose at 36 mg/m^2. This is the full dosage and the pt is tolerating this well -mild hand cramps from Revlimid continue @ 56m -The pt has no prohibitive toxicities from continuing C6D2 Carfilzomib / Revlimid -reviewed input from Dr TAmalia Hailey. We shall need to discontinue her treatment when a timeline for her transplant is noted. -Continue 2.5 mg Eliquis BID.  FOLLOW UP: Plz schedule C7 of KRD as ordered. Portflush , labs on D1 ,8 and 15 of treatment MD visit with C7D15 of treatment      All of the patients questions were answered with apparent satisfaction. The patient knows to call the clinic with any problems, questions or concerns.    The total time spent in the appointment was 30 minutes and more than 50%  was on counseling and direct patient cares.    GSullivan LoneMD MDanforthAAHIVMS SWest Park Surgery CenterCCedar Crest HospitalHematology/Oncology Physician CProvidence - Park [facility] (Office):       [phone](Work cell):  [phone](Fax):           [phone] 10/24/2020 8:52 PM  I, RReinaldo Raddle am acting as scribe for Dr. GSullivan Lone MD.  .I have reviewed the above documentation for accuracy and completeness, and I agree with the above. .Brunetta GeneraMD

## 2020-10-24 NOTE — Telephone Encounter (Signed)
Oral Oncology Patient Advocate Encounter   Received notification from [facility] that prior authorization for Revlimid is required.   PA submitted by fax [facility] [id number] Status is pending   Oral Oncology Clinic will continue to follow.  [place] Patient [place] Phone [phone] Fax [phone] 10/24/2020 3:20 PM

## 2020-10-24 NOTE — Progress Notes (Signed)
Per Dr. Irene Limbo OK to proceed with out CMP results.

## 2020-10-24 NOTE — Telephone Encounter (Signed)
Oral Oncology Patient Advocate Encounter  Prior Authorization for Revlimid has been approved.    PA# [id number] Effective dates: 10/24/20 through 01/24/21  Oral Oncology Clinic will continue to follow.   [place] Patient [place] Phone [phone] Fax [phone] 10/24/2020 3:21 PM

## 2020-10-24 NOTE — Patient Instructions (Signed)
[facility]  Discharge Instructions: Thank you for choosing [facility] to provide your oncology and hematology care.   If you have a lab appointment with the Cancer Center, please go directly to the Cancer Center and check in at the registration area.   Wear comfortable clothing and clothing appropriate for easy access to any Portacath or PICC line.   We strive to give you quality time with your provider. You may need to reschedule your appointment if you arrive late (15 or more minutes).  Arriving late affects you and other patients whose appointments are after yours.  Also, if you miss three or more appointments without notifying the office, you may be dismissed from the clinic at the provider's discretion.      For prescription refill requests, have your pharmacy contact our office and allow 72 hours for refills to be completed.    Today you received the following chemotherapy and/or immunotherapy agent: Carfilzomib (Kyprolis).    To help prevent nausea and vomiting after your treatment, we encourage you to take your nausea medication as directed.  BELOW ARE SYMPTOMS THAT SHOULD BE REPORTED IMMEDIATELY: *FEVER GREATER THAN 100.4 F (38 C) OR HIGHER *CHILLS OR SWEATING *NAUSEA AND VOMITING THAT IS NOT CONTROLLED WITH YOUR NAUSEA MEDICATION *UNUSUAL SHORTNESS OF BREATH *UNUSUAL BRUISING OR BLEEDING *URINARY PROBLEMS (pain or burning when urinating, or frequent urination) *BOWEL PROBLEMS (unusual diarrhea, constipation, pain near the anus) TENDERNESS IN MOUTH AND THROAT WITH OR WITHOUT PRESENCE OF ULCERS (sore throat, sores in mouth, or a toothache) UNUSUAL RASH, SWELLING OR PAIN  UNUSUAL VAGINAL DISCHARGE OR ITCHING   Items with * indicate a potential emergency and should be followed up as soon as possible or go to the Emergency Department if any problems should occur.  Please show the CHEMOTHERAPY ALERT CARD or IMMUNOTHERAPY ALERT CARD at  check-in to the Emergency Department and triage nurse.  Should you have questions after your visit or need to cancel or reschedule your appointment, please contact [facility]  Dept: [phone]  and follow the prompts.  Office hours are 8:00 a.m. to 4:30 p.m. Monday - Friday. Please note that voicemails left after 4:00 p.m. may not be returned until the following business day.  We are closed weekends and major holidays. You have access to a nurse at all times for urgent questions. Please call the main number to the clinic Dept: [phone] and follow the prompts.   For any non-urgent questions, you may also contact your provider using MyChart. We now offer e-Visits for anyone 18 and older to request care online for non-urgent symptoms. For details visit [email or web address].   Also download the MyChart app! Go to the app store, search "MyChart", open the app, select , and log in with your MyChart username and password.  Due to Covid, a mask is required upon entering the hospital/clinic. If you do not have a mask, one will be given to you upon arrival. For doctor visits, patients may have 1 support person aged 18 or older with them. For treatment visits, patients cannot have anyone with them due to current Covid guidelines and our immunocompromised population.   

## 2020-10-25 ENCOUNTER — Inpatient Hospital Stay: Payer: BC Managed Care – PPO | Admitting: Hematology

## 2020-10-25 ENCOUNTER — Inpatient Hospital Stay: Payer: BC Managed Care – PPO

## 2020-10-25 VITALS — BP 127/67 | HR 77 | Temp 98.2°F | Resp 19 | Ht 65.0 in | Wt 139.0 lb

## 2020-10-25 DIAGNOSIS — C9000 Multiple myeloma not having achieved remission: Secondary | ICD-10-CM

## 2020-10-25 DIAGNOSIS — Z5111 Encounter for antineoplastic chemotherapy: Secondary | ICD-10-CM

## 2020-10-25 DIAGNOSIS — Z7189 Other specified counseling: Secondary | ICD-10-CM

## 2020-10-25 MED ORDER — DIPHENHYDRAMINE HCL 25 MG PO CAPS
ORAL_CAPSULE | ORAL | Status: AC
  Filled 2020-10-25: qty 1

## 2020-10-25 MED ORDER — SODIUM CHLORIDE 0.9% FLUSH
10.0000 mL | INTRAVENOUS | Status: DC | PRN
  Filled 2020-10-25: qty 10

## 2020-10-25 MED ORDER — FAMOTIDINE 20 MG PO TABS
20.0000 mg | ORAL_TABLET | Freq: Once | ORAL | Status: AC
  Administered 2020-10-25: 20 mg via ORAL

## 2020-10-25 MED ORDER — DIPHENHYDRAMINE HCL 25 MG PO CAPS
25.0000 mg | ORAL_CAPSULE | Freq: Once | ORAL | Status: AC
  Administered 2020-10-25: 25 mg via ORAL

## 2020-10-25 MED ORDER — FAMOTIDINE 20 MG PO TABS
ORAL_TABLET | ORAL | Status: AC
  Filled 2020-10-25: qty 1

## 2020-10-25 MED ORDER — HEPARIN SOD (PORK) LOCK FLUSH 100 UNIT/ML IV SOLN
500.0000 [IU] | Freq: Once | INTRAVENOUS | Status: DC | PRN
  Filled 2020-10-25: qty 5

## 2020-10-25 MED ORDER — SODIUM CHLORIDE 0.9 % IV SOLN
20.0000 mg | Freq: Once | INTRAVENOUS | Status: AC
  Administered 2020-10-25: 20 mg via INTRAVENOUS
  Filled 2020-10-25: qty 20

## 2020-10-25 MED ORDER — SODIUM CHLORIDE 0.9 % IV SOLN
Freq: Once | INTRAVENOUS | Status: DC
  Filled 2020-10-25: qty 250

## 2020-10-25 MED ORDER — DEXTROSE 5 % IV SOLN
36.0000 mg/m2 | Freq: Once | INTRAVENOUS | Status: AC
  Administered 2020-10-25: 60 mg via INTRAVENOUS
  Filled 2020-10-25: qty 30

## 2020-10-25 MED ORDER — ACETAMINOPHEN 500 MG PO TABS
ORAL_TABLET | ORAL | Status: AC
  Filled 2020-10-25: qty 2

## 2020-10-25 MED ORDER — ACETAMINOPHEN 500 MG PO TABS
1000.0000 mg | ORAL_TABLET | Freq: Once | ORAL | Status: AC
  Administered 2020-10-25: 1000 mg via ORAL

## 2020-10-25 MED ORDER — SODIUM CHLORIDE 0.9 % IV SOLN
Freq: Once | INTRAVENOUS | Status: AC
  Filled 2020-10-25: qty 250

## 2020-10-25 MED ORDER — FAMOTIDINE 20 MG IN NS 100 ML IVPB
INTRAVENOUS | Status: AC
  Filled 2020-10-25: qty 100

## 2020-10-25 NOTE — Patient Instructions (Signed)
[facility]  Discharge Instructions: Thank you for choosing [facility] to provide your oncology and hematology care.   If you have a lab appointment with the Cancer Center, please go directly to the Cancer Center and check in at the registration area.   Wear comfortable clothing and clothing appropriate for easy access to any Portacath or PICC line.   We strive to give you quality time with your provider. You may need to reschedule your appointment if you arrive late (15 or more minutes).  Arriving late affects you and other patients whose appointments are after yours.  Also, if you miss three or more appointments without notifying the office, you may be dismissed from the clinic at the provider's discretion.      For prescription refill requests, have your pharmacy contact our office and allow 72 hours for refills to be completed.    Today you received the following chemotherapy and/or immunotherapy agent: Carfilzomib (Kyprolis).    To help prevent nausea and vomiting after your treatment, we encourage you to take your nausea medication as directed.  BELOW ARE SYMPTOMS THAT SHOULD BE REPORTED IMMEDIATELY: *FEVER GREATER THAN 100.4 F (38 C) OR HIGHER *CHILLS OR SWEATING *NAUSEA AND VOMITING THAT IS NOT CONTROLLED WITH YOUR NAUSEA MEDICATION *UNUSUAL SHORTNESS OF BREATH *UNUSUAL BRUISING OR BLEEDING *URINARY PROBLEMS (pain or burning when urinating, or frequent urination) *BOWEL PROBLEMS (unusual diarrhea, constipation, pain near the anus) TENDERNESS IN MOUTH AND THROAT WITH OR WITHOUT PRESENCE OF ULCERS (sore throat, sores in mouth, or a toothache) UNUSUAL RASH, SWELLING OR PAIN  UNUSUAL VAGINAL DISCHARGE OR ITCHING   Items with * indicate a potential emergency and should be followed up as soon as possible or go to the Emergency Department if any problems should occur.  Please show the CHEMOTHERAPY ALERT CARD or IMMUNOTHERAPY ALERT CARD at  check-in to the Emergency Department and triage nurse.  Should you have questions after your visit or need to cancel or reschedule your appointment, please contact [facility]  Dept: [phone]  and follow the prompts.  Office hours are 8:00 a.m. to 4:30 p.m. Monday - Friday. Please note that voicemails left after 4:00 p.m. may not be returned until the following business day.  We are closed weekends and major holidays. You have access to a nurse at all times for urgent questions. Please call the main number to the clinic Dept: [phone] and follow the prompts.   For any non-urgent questions, you may also contact your provider using MyChart. We now offer e-Visits for anyone 18 and older to request care online for non-urgent symptoms. For details visit [email or web address].   Also download the MyChart app! Go to the app store, search "MyChart", open the app, select [facility], and log in with your MyChart username and password.  Due to Covid, a mask is required upon entering the hospital/clinic. If you do not have a mask, one will be given to you upon arrival. For doctor visits, patients may have 1 support person aged 18 or older with them. For treatment visits, patients cannot have anyone with them due to current Covid guidelines and our immunocompromised population.   

## 2020-10-28 ENCOUNTER — Telehealth: Payer: Self-pay | Admitting: Hematology

## 2020-10-28 NOTE — Telephone Encounter (Signed)
Scheduled follow-up appointment per 7/22 los. Patient is aware.

## 2020-10-31 ENCOUNTER — Other Ambulatory Visit: Payer: Self-pay

## 2020-10-31 ENCOUNTER — Encounter: Payer: Self-pay | Admitting: Hematology

## 2020-10-31 ENCOUNTER — Inpatient Hospital Stay: Payer: BC Managed Care – PPO

## 2020-10-31 VITALS — BP 128/62 | HR 77 | Temp 98.3°F | Resp 16

## 2020-10-31 DIAGNOSIS — C9000 Multiple myeloma not having achieved remission: Secondary | ICD-10-CM | POA: Diagnosis not present

## 2020-10-31 DIAGNOSIS — Z7189 Other specified counseling: Secondary | ICD-10-CM

## 2020-10-31 DIAGNOSIS — Z95828 Presence of other vascular implants and grafts: Secondary | ICD-10-CM

## 2020-10-31 LAB — CBC WITH DIFFERENTIAL/PLATELET
Abs Immature Granulocytes: 0.47 10*3/uL — ABNORMAL HIGH (ref 0.00–0.07)
Basophils Absolute: 0.1 10*3/uL (ref 0.0–0.1)
Basophils Relative: 1 %
Eosinophils Absolute: 0.2 10*3/uL (ref 0.0–0.5)
Eosinophils Relative: 2 %
HCT: 35 % — ABNORMAL LOW (ref 36.0–46.0)
Hemoglobin: 12 g/dL (ref 12.0–15.0)
Immature Granulocytes: 5 %
Lymphocytes Relative: 18 %
Lymphs Abs: 1.6 10*3/uL (ref 0.7–4.0)
MCH: 33.4 pg (ref 26.0–34.0)
MCHC: 34.3 g/dL (ref 30.0–36.0)
MCV: 97.5 fL (ref 80.0–100.0)
Monocytes Absolute: 0.8 10*3/uL (ref 0.1–1.0)
Monocytes Relative: 9 %
Neutro Abs: 5.7 10*3/uL (ref 1.7–7.7)
Neutrophils Relative %: 65 %
Platelets: 131 10*3/uL — ABNORMAL LOW (ref 150–400)
RBC: 3.59 MIL/uL — ABNORMAL LOW (ref 3.87–5.11)
RDW: 14.6 % (ref 11.5–15.5)
WBC: 8.9 10*3/uL (ref 4.0–10.5)
nRBC: 0 % (ref 0.0–0.2)

## 2020-10-31 LAB — CMP (CANCER CENTER ONLY)
ALT: 11 U/L (ref 0–44)
AST: 13 U/L — ABNORMAL LOW (ref 15–41)
Albumin: 3.6 g/dL (ref 3.5–5.0)
Alkaline Phosphatase: 55 U/L (ref 38–126)
Anion gap: 7 (ref 5–15)
BUN: 17 mg/dL (ref 6–20)
CO2: 26 mmol/L (ref 22–32)
Calcium: 8.8 mg/dL — ABNORMAL LOW (ref 8.9–10.3)
Chloride: 107 mmol/L (ref 98–111)
Creatinine: 1.06 mg/dL — ABNORMAL HIGH (ref 0.44–1.00)
GFR, Estimated: >60 mL/min (ref >60)
Glucose, Bld: 105 mg/dL — ABNORMAL HIGH (ref 70–99)
Potassium: 4.3 mmol/L (ref 3.5–5.1)
Sodium: 140 mmol/L (ref 135–145)
Total Bilirubin: 0.8 mg/dL (ref 0.3–1.2)
Total Protein: 5.7 g/dL — ABNORMAL LOW (ref 6.5–8.1)

## 2020-10-31 MED ORDER — SODIUM CHLORIDE 0.9 % IV SOLN
Freq: Once | INTRAVENOUS | Status: AC
  Filled 2020-10-31: qty 250

## 2020-10-31 MED ORDER — FAMOTIDINE 20 MG PO TABS
20.0000 mg | ORAL_TABLET | Freq: Once | ORAL | Status: AC
  Administered 2020-10-31: 20 mg via ORAL

## 2020-10-31 MED ORDER — SODIUM CHLORIDE 0.9% FLUSH
10.0000 mL | Freq: Once | INTRAVENOUS | Status: AC
  Administered 2020-10-31: 10 mL
  Filled 2020-10-31: qty 10

## 2020-10-31 MED ORDER — SODIUM CHLORIDE 0.9% FLUSH
10.0000 mL | INTRAVENOUS | Status: DC | PRN
  Administered 2020-10-31: 10 mL
  Filled 2020-10-31: qty 10

## 2020-10-31 MED ORDER — ACETAMINOPHEN 500 MG PO TABS
ORAL_TABLET | ORAL | Status: AC
  Filled 2020-10-31: qty 2

## 2020-10-31 MED ORDER — DEXTROSE 5 % IV SOLN
36.0000 mg/m2 | Freq: Once | INTRAVENOUS | Status: AC
  Administered 2020-10-31: 60 mg via INTRAVENOUS
  Filled 2020-10-31: qty 30

## 2020-10-31 MED ORDER — DIPHENHYDRAMINE HCL 25 MG PO CAPS
ORAL_CAPSULE | ORAL | Status: AC
  Filled 2020-10-31: qty 1

## 2020-10-31 MED ORDER — SODIUM CHLORIDE 0.9 % IV SOLN
20.0000 mg | Freq: Once | INTRAVENOUS | Status: AC
  Administered 2020-10-31: 20 mg via INTRAVENOUS
  Filled 2020-10-31: qty 20

## 2020-10-31 MED ORDER — FAMOTIDINE 20 MG PO TABS
ORAL_TABLET | ORAL | Status: AC
  Filled 2020-10-31: qty 1

## 2020-10-31 MED ORDER — HEPARIN SOD (PORK) LOCK FLUSH 100 UNIT/ML IV SOLN
500.0000 [IU] | Freq: Once | INTRAVENOUS | Status: AC | PRN
  Administered 2020-10-31: 500 [IU]
  Filled 2020-10-31: qty 5

## 2020-10-31 MED ORDER — DIPHENHYDRAMINE HCL 25 MG PO CAPS
25.0000 mg | ORAL_CAPSULE | Freq: Once | ORAL | Status: AC
  Administered 2020-10-31: 25 mg via ORAL

## 2020-10-31 MED ORDER — ACETAMINOPHEN 500 MG PO TABS
1000.0000 mg | ORAL_TABLET | Freq: Once | ORAL | Status: AC
  Administered 2020-10-31: 1000 mg via ORAL

## 2020-11-01 ENCOUNTER — Inpatient Hospital Stay: Payer: BC Managed Care – PPO

## 2020-11-01 VITALS — BP 138/60 | HR 62 | Temp 98.0°F | Resp 16 | Wt 141.4 lb

## 2020-11-01 DIAGNOSIS — C9000 Multiple myeloma not having achieved remission: Secondary | ICD-10-CM

## 2020-11-01 DIAGNOSIS — Z7189 Other specified counseling: Secondary | ICD-10-CM

## 2020-11-01 MED ORDER — FAMOTIDINE 20 MG PO TABS
20.0000 mg | ORAL_TABLET | Freq: Once | ORAL | Status: AC
  Administered 2020-11-01: 20 mg via ORAL

## 2020-11-01 MED ORDER — SODIUM CHLORIDE 0.9 % IV SOLN
Freq: Once | INTRAVENOUS | Status: AC
  Filled 2020-11-01: qty 250

## 2020-11-01 MED ORDER — HEPARIN SOD (PORK) LOCK FLUSH 100 UNIT/ML IV SOLN
500.0000 [IU] | Freq: Once | INTRAVENOUS | Status: AC | PRN
  Administered 2020-11-01: 500 [IU]
  Filled 2020-11-01: qty 5

## 2020-11-01 MED ORDER — DIPHENHYDRAMINE HCL 25 MG PO CAPS
ORAL_CAPSULE | ORAL | Status: AC
  Filled 2020-11-01: qty 1

## 2020-11-01 MED ORDER — SODIUM CHLORIDE 0.9 % IV SOLN
20.0000 mg | Freq: Once | INTRAVENOUS | Status: AC
  Administered 2020-11-01: 20 mg via INTRAVENOUS
  Filled 2020-11-01: qty 20

## 2020-11-01 MED ORDER — FAMOTIDINE 20 MG PO TABS
ORAL_TABLET | ORAL | Status: AC
  Filled 2020-11-01: qty 1

## 2020-11-01 MED ORDER — DIPHENHYDRAMINE HCL 25 MG PO CAPS
25.0000 mg | ORAL_CAPSULE | Freq: Once | ORAL | Status: AC
  Administered 2020-11-01: 25 mg via ORAL

## 2020-11-01 MED ORDER — DEXTROSE 5 % IV SOLN
36.0000 mg/m2 | Freq: Once | INTRAVENOUS | Status: AC
  Administered 2020-11-01: 60 mg via INTRAVENOUS
  Filled 2020-11-01: qty 30

## 2020-11-01 MED ORDER — ACETAMINOPHEN 500 MG PO TABS
ORAL_TABLET | ORAL | Status: AC
  Filled 2020-11-01: qty 2

## 2020-11-01 MED ORDER — ACETAMINOPHEN 500 MG PO TABS
1000.0000 mg | ORAL_TABLET | Freq: Once | ORAL | Status: AC
  Administered 2020-11-01: 1000 mg via ORAL

## 2020-11-01 MED ORDER — SODIUM CHLORIDE 0.9% FLUSH
10.0000 mL | INTRAVENOUS | Status: DC | PRN
  Administered 2020-11-01: 10 mL
  Filled 2020-11-01: qty 10

## 2020-11-01 NOTE — Patient Instructions (Signed)
[facility]  Discharge Instructions: Thank you for choosing [facility] to provide your oncology and hematology care.   If you have a lab appointment with the Cancer Center, please go directly to the Cancer Center and check in at the registration area.   Wear comfortable clothing and clothing appropriate for easy access to any Portacath or PICC line.   We strive to give you quality time with your provider. You may need to reschedule your appointment if you arrive late (15 or more minutes).  Arriving late affects you and other patients whose appointments are after yours.  Also, if you miss three or more appointments without notifying the office, you may be dismissed from the clinic at the provider's discretion.      For prescription refill requests, have your pharmacy contact our office and allow 72 hours for refills to be completed.    Today you received the following chemotherapy and/or immunotherapy agents: Carfilzomib.      To help prevent nausea and vomiting after your treatment, we encourage you to take your nausea medication as directed.  BELOW ARE SYMPTOMS THAT SHOULD BE REPORTED IMMEDIATELY: *FEVER GREATER THAN 100.4 F (38 C) OR HIGHER *CHILLS OR SWEATING *NAUSEA AND VOMITING THAT IS NOT CONTROLLED WITH YOUR NAUSEA MEDICATION *UNUSUAL SHORTNESS OF BREATH *UNUSUAL BRUISING OR BLEEDING *URINARY PROBLEMS (pain or burning when urinating, or frequent urination) *BOWEL PROBLEMS (unusual diarrhea, constipation, pain near the anus) TENDERNESS IN MOUTH AND THROAT WITH OR WITHOUT PRESENCE OF ULCERS (sore throat, sores in mouth, or a toothache) UNUSUAL RASH, SWELLING OR PAIN  UNUSUAL VAGINAL DISCHARGE OR ITCHING   Items with * indicate a potential emergency and should be followed up as soon as possible or go to the Emergency Department if any problems should occur.  Please show the CHEMOTHERAPY ALERT CARD or IMMUNOTHERAPY ALERT CARD at check-in  to the Emergency Department and triage nurse.  Should you have questions after your visit or need to cancel or reschedule your appointment, please contact [facility]  Dept: [phone]  and follow the prompts.  Office hours are 8:00 a.m. to 4:30 p.m. Monday - Friday. Please note that voicemails left after 4:00 p.m. may not be returned until the following business day.  We are closed weekends and major holidays. You have access to a nurse at all times for urgent questions. Please call the main number to the clinic Dept: [phone] and follow the prompts.   For any non-urgent questions, you may also contact your provider using MyChart. We now offer e-Visits for anyone 18 and older to request care online for non-urgent symptoms. For details visit [email or web address].   Also download the MyChart app! Go to the app store, search "MyChart", open the app, select , and log in with your MyChart username and password.  Due to Covid, a mask is required upon entering the hospital/clinic. If you do not have a mask, one will be given to you upon arrival. For doctor visits, patients may have 1 support person aged 18 or older with them. For treatment visits, patients cannot have anyone with them due to current Covid guidelines and our immunocompromised population.   

## 2020-11-02 ENCOUNTER — Other Ambulatory Visit: Payer: Self-pay | Admitting: Hematology

## 2020-11-02 DIAGNOSIS — C9000 Multiple myeloma not having achieved remission: Secondary | ICD-10-CM

## 2020-11-02 DIAGNOSIS — Z7189 Other specified counseling: Secondary | ICD-10-CM

## 2020-11-03 ENCOUNTER — Encounter: Payer: Self-pay | Admitting: Hematology

## 2020-11-07 ENCOUNTER — Other Ambulatory Visit: Payer: Self-pay

## 2020-11-07 ENCOUNTER — Inpatient Hospital Stay: Payer: BC Managed Care – PPO | Attending: Hematology

## 2020-11-07 ENCOUNTER — Inpatient Hospital Stay: Payer: BC Managed Care – PPO

## 2020-11-07 VITALS — BP 117/64 | HR 90 | Temp 98.4°F | Resp 16 | Wt 136.2 lb

## 2020-11-07 DIAGNOSIS — Z79899 Other long term (current) drug therapy: Secondary | ICD-10-CM | POA: Diagnosis not present

## 2020-11-07 DIAGNOSIS — C9000 Multiple myeloma not having achieved remission: Secondary | ICD-10-CM | POA: Diagnosis present

## 2020-11-07 DIAGNOSIS — Z5112 Encounter for antineoplastic immunotherapy: Secondary | ICD-10-CM | POA: Insufficient documentation

## 2020-11-07 DIAGNOSIS — Z95828 Presence of other vascular implants and grafts: Secondary | ICD-10-CM

## 2020-11-07 DIAGNOSIS — Z7189 Other specified counseling: Secondary | ICD-10-CM

## 2020-11-07 LAB — CBC WITH DIFFERENTIAL/PLATELET
Abs Immature Granulocytes: 0.19 10*3/uL — ABNORMAL HIGH (ref 0.00–0.07)
Basophils Absolute: 0 10*3/uL (ref 0.0–0.1)
Basophils Relative: 0 %
Eosinophils Absolute: 0.5 10*3/uL (ref 0.0–0.5)
Eosinophils Relative: 5 %
HCT: 35.6 % — ABNORMAL LOW (ref 36.0–46.0)
Hemoglobin: 12 g/dL (ref 12.0–15.0)
Immature Granulocytes: 2 %
Lymphocytes Relative: 17 %
Lymphs Abs: 1.5 10*3/uL (ref 0.7–4.0)
MCH: 33.8 pg (ref 26.0–34.0)
MCHC: 33.7 g/dL (ref 30.0–36.0)
MCV: 100.3 fL — ABNORMAL HIGH (ref 80.0–100.0)
Monocytes Absolute: 1.3 10*3/uL — ABNORMAL HIGH (ref 0.1–1.0)
Monocytes Relative: 15 %
Neutro Abs: 5.4 10*3/uL (ref 1.7–7.7)
Neutrophils Relative %: 61 %
Platelets: 144 10*3/uL — ABNORMAL LOW (ref 150–400)
RBC: 3.55 MIL/uL — ABNORMAL LOW (ref 3.87–5.11)
RDW: 14.4 % (ref 11.5–15.5)
WBC: 9 10*3/uL (ref 4.0–10.5)
nRBC: 0 % (ref 0.0–0.2)

## 2020-11-07 LAB — CMP (CANCER CENTER ONLY)
ALT: 16 U/L (ref 0–44)
AST: 15 U/L (ref 15–41)
Albumin: 3.7 g/dL (ref 3.5–5.0)
Alkaline Phosphatase: 47 U/L (ref 38–126)
Anion gap: 11 (ref 5–15)
BUN: 21 mg/dL — ABNORMAL HIGH (ref 6–20)
CO2: 24 mmol/L (ref 22–32)
Calcium: 8.8 mg/dL — ABNORMAL LOW (ref 8.9–10.3)
Chloride: 105 mmol/L (ref 98–111)
Creatinine: 1.12 mg/dL — ABNORMAL HIGH (ref 0.44–1.00)
GFR, Estimated: 56 mL/min — ABNORMAL LOW (ref >60)
Glucose, Bld: 106 mg/dL — ABNORMAL HIGH (ref 70–99)
Potassium: 4.1 mmol/L (ref 3.5–5.1)
Sodium: 140 mmol/L (ref 135–145)
Total Bilirubin: 0.9 mg/dL (ref 0.3–1.2)
Total Protein: 5.7 g/dL — ABNORMAL LOW (ref 6.5–8.1)

## 2020-11-07 MED ORDER — DEXTROSE 5 % IV SOLN
36.0000 mg/m2 | Freq: Once | INTRAVENOUS | Status: AC
  Administered 2020-11-07: 60 mg via INTRAVENOUS
  Filled 2020-11-07: qty 30

## 2020-11-07 MED ORDER — FAMOTIDINE 20 MG PO TABS
ORAL_TABLET | ORAL | Status: AC
  Filled 2020-11-07: qty 1

## 2020-11-07 MED ORDER — SODIUM CHLORIDE 0.9 % IV SOLN
Freq: Once | INTRAVENOUS | Status: AC
  Filled 2020-11-07: qty 250

## 2020-11-07 MED ORDER — FAMOTIDINE 20 MG PO TABS
20.0000 mg | ORAL_TABLET | Freq: Once | ORAL | Status: AC
  Administered 2020-11-07: 20 mg via ORAL

## 2020-11-07 MED ORDER — HEPARIN SOD (PORK) LOCK FLUSH 100 UNIT/ML IV SOLN
500.0000 [IU] | Freq: Once | INTRAVENOUS | Status: AC | PRN
Start: 2020-11-07 — End: 2020-11-07
  Administered 2020-11-07: 500 [IU]
  Filled 2020-11-07: qty 5

## 2020-11-07 MED ORDER — SODIUM CHLORIDE 0.9% FLUSH
10.0000 mL | Freq: Once | INTRAVENOUS | Status: AC
  Administered 2020-11-07: 10 mL
  Filled 2020-11-07: qty 10

## 2020-11-07 MED ORDER — SODIUM CHLORIDE 0.9% FLUSH
10.0000 mL | INTRAVENOUS | Status: DC | PRN
  Administered 2020-11-07: 10 mL
  Filled 2020-11-07: qty 10

## 2020-11-07 MED ORDER — DIPHENHYDRAMINE HCL 25 MG PO CAPS
ORAL_CAPSULE | ORAL | Status: AC
  Filled 2020-11-07: qty 1

## 2020-11-07 MED ORDER — SODIUM CHLORIDE 0.9 % IV SOLN
20.0000 mg | Freq: Once | INTRAVENOUS | Status: AC
  Administered 2020-11-07: 20 mg via INTRAVENOUS
  Filled 2020-11-07: qty 20

## 2020-11-07 MED ORDER — DIPHENHYDRAMINE HCL 25 MG PO CAPS
25.0000 mg | ORAL_CAPSULE | Freq: Once | ORAL | Status: AC
  Administered 2020-11-07: 25 mg via ORAL

## 2020-11-07 MED ORDER — ACETAMINOPHEN 500 MG PO TABS
1000.0000 mg | ORAL_TABLET | Freq: Once | ORAL | Status: AC
  Administered 2020-11-07: 1000 mg via ORAL

## 2020-11-07 MED ORDER — ACETAMINOPHEN 500 MG PO TABS
ORAL_TABLET | ORAL | Status: AC
  Filled 2020-11-07: qty 2

## 2020-11-07 NOTE — Patient Instructions (Signed)
[facility]  Discharge Instructions: Thank you for choosing [facility] to provide your oncology and hematology care.   If you have a lab appointment with the Cancer Center, please go directly to the Cancer Center and check in at the registration area.   Wear comfortable clothing and clothing appropriate for easy access to any Portacath or PICC line.   We strive to give you quality time with your provider. You may need to reschedule your appointment if you arrive late (15 or more minutes).  Arriving late affects you and other patients whose appointments are after yours.  Also, if you miss three or more appointments without notifying the office, you may be dismissed from the clinic at the provider's discretion.      For prescription refill requests, have your pharmacy contact our office and allow 72 hours for refills to be completed.    Today you received the following chemotherapy and/or immunotherapy agents: Kyprolis    To help prevent nausea and vomiting after your treatment, we encourage you to take your nausea medication as directed.  BELOW ARE SYMPTOMS THAT SHOULD BE REPORTED IMMEDIATELY: . *FEVER GREATER THAN 100.4 F (38 C) OR HIGHER . *CHILLS OR SWEATING . *NAUSEA AND VOMITING THAT IS NOT CONTROLLED WITH YOUR NAUSEA MEDICATION . *UNUSUAL SHORTNESS OF BREATH . *UNUSUAL BRUISING OR BLEEDING . *URINARY PROBLEMS (pain or burning when urinating, or frequent urination) . *BOWEL PROBLEMS (unusual diarrhea, constipation, pain near the anus) . TENDERNESS IN MOUTH AND THROAT WITH OR WITHOUT PRESENCE OF ULCERS (sore throat, sores in mouth, or a toothache) . UNUSUAL RASH, SWELLING OR PAIN  . UNUSUAL VAGINAL DISCHARGE OR ITCHING   Items with * indicate a potential emergency and should be followed up as soon as possible or go to the Emergency Department if any problems should occur.  Please show the CHEMOTHERAPY ALERT CARD or IMMUNOTHERAPY ALERT  CARD at check-in to the Emergency Department and triage nurse.  Should you have questions after your visit or need to cancel or reschedule your appointment, please contact [facility]  Dept: [phone]  and follow the prompts.  Office hours are 8:00 a.m. to 4:30 p.m. Monday - Friday. Please note that voicemails left after 4:00 p.m. may not be returned until the following business day.  We are closed weekends and major holidays. You have access to a nurse at all times for urgent questions. Please call the main number to the clinic Dept: [phone] and follow the prompts.   For any non-urgent questions, you may also contact your provider using MyChart. We now offer e-Visits for anyone 18 and older to request care online for non-urgent symptoms. For details visit [email or web address].   Also download the MyChart app! Go to the app store, search "MyChart", open the app, select [facility], and log in with your MyChart username and password.  Due to Covid, a mask is required upon entering the hospital/clinic. If you do not have a mask, one will be given to you upon arrival. For doctor visits, patients may have 1 support person aged 18 or older with them. For treatment visits, patients cannot have anyone with them due to current Covid guidelines and our immunocompromised population.   

## 2020-11-07 NOTE — Progress Notes (Signed)
Patient stated that diarrhea has been worse since last chemo with up to 4 episodes a day. She is having to take more Imodium. No other symptoms or complaints and VSS. MD made aware

## 2020-11-08 ENCOUNTER — Inpatient Hospital Stay: Payer: BC Managed Care – PPO

## 2020-11-08 VITALS — BP 137/70 | HR 68 | Temp 98.1°F | Resp 16

## 2020-11-08 DIAGNOSIS — Z5112 Encounter for antineoplastic immunotherapy: Secondary | ICD-10-CM | POA: Diagnosis not present

## 2020-11-08 DIAGNOSIS — Z7189 Other specified counseling: Secondary | ICD-10-CM

## 2020-11-08 DIAGNOSIS — C9000 Multiple myeloma not having achieved remission: Secondary | ICD-10-CM

## 2020-11-08 MED ORDER — HEPARIN SOD (PORK) LOCK FLUSH 100 UNIT/ML IV SOLN
500.0000 [IU] | Freq: Once | INTRAVENOUS | Status: AC | PRN
  Administered 2020-11-08: 500 [IU]
  Filled 2020-11-08: qty 5

## 2020-11-08 MED ORDER — DIPHENHYDRAMINE HCL 25 MG PO CAPS
ORAL_CAPSULE | ORAL | Status: AC
  Filled 2020-11-08: qty 1

## 2020-11-08 MED ORDER — SODIUM CHLORIDE 0.9 % IV SOLN
20.0000 mg | Freq: Once | INTRAVENOUS | Status: AC
  Administered 2020-11-08: 20 mg via INTRAVENOUS
  Filled 2020-11-08: qty 20

## 2020-11-08 MED ORDER — SODIUM CHLORIDE 0.9% FLUSH
10.0000 mL | INTRAVENOUS | Status: DC | PRN
  Administered 2020-11-08: 10 mL
  Filled 2020-11-08: qty 10

## 2020-11-08 MED ORDER — FAMOTIDINE 20 MG PO TABS
ORAL_TABLET | ORAL | Status: AC
  Filled 2020-11-08: qty 1

## 2020-11-08 MED ORDER — ACETAMINOPHEN 500 MG PO TABS
1000.0000 mg | ORAL_TABLET | Freq: Once | ORAL | Status: AC
  Administered 2020-11-08: 1000 mg via ORAL

## 2020-11-08 MED ORDER — FAMOTIDINE 20 MG PO TABS
20.0000 mg | ORAL_TABLET | Freq: Once | ORAL | Status: AC
  Administered 2020-11-08: 20 mg via ORAL

## 2020-11-08 MED ORDER — DIPHENHYDRAMINE HCL 25 MG PO CAPS
25.0000 mg | ORAL_CAPSULE | Freq: Once | ORAL | Status: AC
  Administered 2020-11-08: 25 mg via ORAL

## 2020-11-08 MED ORDER — ACETAMINOPHEN 500 MG PO TABS
ORAL_TABLET | ORAL | Status: AC
  Filled 2020-11-08: qty 2

## 2020-11-08 MED ORDER — DEXTROSE 5 % IV SOLN
36.0000 mg/m2 | Freq: Once | INTRAVENOUS | Status: AC
  Administered 2020-11-08: 60 mg via INTRAVENOUS
  Filled 2020-11-08: qty 30

## 2020-11-08 MED ORDER — SODIUM CHLORIDE 0.9 % IV SOLN
Freq: Once | INTRAVENOUS | Status: AC
  Filled 2020-11-08: qty 250

## 2020-11-08 NOTE — Patient Instructions (Signed)
[facility]  Discharge Instructions: Thank you for choosing [facility] to provide your oncology and hematology care.   If you have a lab appointment with the Cancer Center, please go directly to the Cancer Center and check in at the registration area.   Wear comfortable clothing and clothing appropriate for easy access to any Portacath or PICC line.   We strive to give you quality time with your provider. You may need to reschedule your appointment if you arrive late (15 or more minutes).  Arriving late affects you and other patients whose appointments are after yours.  Also, if you miss three or more appointments without notifying the office, you may be dismissed from the clinic at the provider's discretion.      For prescription refill requests, have your pharmacy contact our office and allow 72 hours for refills to be completed.    Today you received the following chemotherapy and/or immunotherapy agents carfilzomib   To help prevent nausea and vomiting after your treatment, we encourage you to take your nausea medication as directed.  BELOW ARE SYMPTOMS THAT SHOULD BE REPORTED IMMEDIATELY: . *FEVER GREATER THAN 100.4 F (38 C) OR HIGHER . *CHILLS OR SWEATING . *NAUSEA AND VOMITING THAT IS NOT CONTROLLED WITH YOUR NAUSEA MEDICATION . *UNUSUAL SHORTNESS OF BREATH . *UNUSUAL BRUISING OR BLEEDING . *URINARY PROBLEMS (pain or burning when urinating, or frequent urination) . *BOWEL PROBLEMS (unusual diarrhea, constipation, pain near the anus) . TENDERNESS IN MOUTH AND THROAT WITH OR WITHOUT PRESENCE OF ULCERS (sore throat, sores in mouth, or a toothache) . UNUSUAL RASH, SWELLING OR PAIN  . UNUSUAL VAGINAL DISCHARGE OR ITCHING   Items with * indicate a potential emergency and should be followed up as soon as possible or go to the Emergency Department if any problems should occur.  Please show the CHEMOTHERAPY ALERT CARD or IMMUNOTHERAPY ALERT  CARD at check-in to the Emergency Department and triage nurse.  Should you have questions after your visit or need to cancel or reschedule your appointment, please contact [facility]  Dept: [phone]  and follow the prompts.  Office hours are 8:00 a.m. to 4:30 p.m. Monday - Friday. Please note that voicemails left after 4:00 p.m. may not be returned until the following business day.  We are closed weekends and major holidays. You have access to a nurse at all times for urgent questions. Please call the main number to the clinic Dept: [phone] and follow the prompts.   For any non-urgent questions, you may also contact your provider using MyChart. We now offer e-Visits for anyone 18 and older to request care online for non-urgent symptoms. For details visit [email or web address].   Also download the MyChart app! Go to the app store, search "MyChart", open the app, select [facility], and log in with your MyChart username and password.  Due to Covid, a mask is required upon entering the hospital/clinic. If you do not have a mask, one will be given to you upon arrival. For doctor visits, patients may have 1 support person aged 18 or older with them. For treatment visits, patients cannot have anyone with them due to current Covid guidelines and our immunocompromised population.   

## 2020-11-15 ENCOUNTER — Other Ambulatory Visit: Payer: Self-pay | Admitting: Hematology

## 2020-11-15 DIAGNOSIS — C9000 Multiple myeloma not having achieved remission: Secondary | ICD-10-CM

## 2020-11-15 DIAGNOSIS — Z7189 Other specified counseling: Secondary | ICD-10-CM

## 2020-11-18 ENCOUNTER — Encounter: Payer: Self-pay | Admitting: Hematology

## 2020-11-18 ENCOUNTER — Other Ambulatory Visit: Payer: Self-pay | Admitting: Hematology

## 2020-11-18 ENCOUNTER — Telehealth: Payer: Self-pay | Admitting: Hematology

## 2020-11-18 DIAGNOSIS — C9000 Multiple myeloma not having achieved remission: Secondary | ICD-10-CM

## 2020-11-18 DIAGNOSIS — Z7189 Other specified counseling: Secondary | ICD-10-CM

## 2020-11-18 NOTE — Telephone Encounter (Signed)
Moved appts times per [facility]. Pt aware.

## 2020-11-21 ENCOUNTER — Inpatient Hospital Stay: Payer: BC Managed Care – PPO

## 2020-11-21 ENCOUNTER — Ambulatory Visit: Payer: BC Managed Care – PPO

## 2020-11-21 ENCOUNTER — Other Ambulatory Visit: Payer: BC Managed Care – PPO

## 2020-11-21 ENCOUNTER — Other Ambulatory Visit: Payer: Self-pay

## 2020-11-21 ENCOUNTER — Other Ambulatory Visit: Payer: Self-pay | Admitting: Hematology

## 2020-11-21 VITALS — BP 139/78 | HR 83 | Temp 98.3°F | Resp 16 | Wt 135.0 lb

## 2020-11-21 DIAGNOSIS — Z7189 Other specified counseling: Secondary | ICD-10-CM

## 2020-11-21 DIAGNOSIS — Z95828 Presence of other vascular implants and grafts: Secondary | ICD-10-CM

## 2020-11-21 DIAGNOSIS — Z5112 Encounter for antineoplastic immunotherapy: Secondary | ICD-10-CM | POA: Diagnosis not present

## 2020-11-21 DIAGNOSIS — C9000 Multiple myeloma not having achieved remission: Secondary | ICD-10-CM

## 2020-11-21 LAB — CMP (CANCER CENTER ONLY)
ALT: 15 U/L (ref 0–44)
AST: 15 U/L (ref 15–41)
Albumin: 3.9 g/dL (ref 3.5–5.0)
Alkaline Phosphatase: 57 U/L (ref 38–126)
Anion gap: 10 (ref 5–15)
BUN: 13 mg/dL (ref 6–20)
CO2: 24 mmol/L (ref 22–32)
Calcium: 9 mg/dL (ref 8.9–10.3)
Chloride: 105 mmol/L (ref 98–111)
Creatinine: 0.82 mg/dL (ref 0.44–1.00)
GFR, Estimated: >60 mL/min (ref >60)
Glucose, Bld: 112 mg/dL — ABNORMAL HIGH (ref 70–99)
Potassium: 3.5 mmol/L (ref 3.5–5.1)
Sodium: 139 mmol/L (ref 135–145)
Total Bilirubin: 0.8 mg/dL (ref 0.3–1.2)
Total Protein: 6 g/dL — ABNORMAL LOW (ref 6.5–8.1)

## 2020-11-21 LAB — CBC WITH DIFFERENTIAL/PLATELET
Abs Immature Granulocytes: 0.06 10*3/uL (ref 0.00–0.07)
Basophils Absolute: 0.1 10*3/uL (ref 0.0–0.1)
Basophils Relative: 2 %
Eosinophils Absolute: 0.1 10*3/uL (ref 0.0–0.5)
Eosinophils Relative: 2 %
HCT: 36 % (ref 36.0–46.0)
Hemoglobin: 12.3 g/dL (ref 12.0–15.0)
Immature Granulocytes: 1 %
Lymphocytes Relative: 21 %
Lymphs Abs: 1.6 10*3/uL (ref 0.7–4.0)
MCH: 33.6 pg (ref 26.0–34.0)
MCHC: 34.2 g/dL (ref 30.0–36.0)
MCV: 98.4 fL (ref 80.0–100.0)
Monocytes Absolute: 1 10*3/uL (ref 0.1–1.0)
Monocytes Relative: 14 %
Neutro Abs: 4.7 10*3/uL (ref 1.7–7.7)
Neutrophils Relative %: 60 %
Platelets: 242 10*3/uL (ref 150–400)
RBC: 3.66 MIL/uL — ABNORMAL LOW (ref 3.87–5.11)
RDW: 14.1 % (ref 11.5–15.5)
WBC: 7.5 10*3/uL (ref 4.0–10.5)
nRBC: 0 % (ref 0.0–0.2)

## 2020-11-21 MED ORDER — SODIUM CHLORIDE 0.9 % IV SOLN: Freq: Once | INTRAVENOUS | Status: AC

## 2020-11-21 MED ORDER — CARFILZOMIB CHEMO INJECTION 60 MG
36.0000 mg/m2 | Freq: Once | INTRAVENOUS | Status: AC
  Administered 2020-11-21: 60 mg via INTRAVENOUS
  Filled 2020-11-21: qty 30

## 2020-11-21 MED ORDER — SODIUM CHLORIDE 0.9% FLUSH
10.0000 mL | INTRAVENOUS | Status: DC | PRN
  Administered 2020-11-21: 10 mL

## 2020-11-21 MED ORDER — SODIUM CHLORIDE 0.9% FLUSH
10.0000 mL | Freq: Once | INTRAVENOUS | Status: AC
  Administered 2020-11-21: 10 mL

## 2020-11-21 MED ORDER — HEPARIN SOD (PORK) LOCK FLUSH 100 UNIT/ML IV SOLN
500.0000 [IU] | Freq: Once | INTRAVENOUS | Status: AC | PRN
Start: 2020-11-21 — End: 2020-11-21
  Administered 2020-11-21: 500 [IU]

## 2020-11-21 MED ORDER — FAMOTIDINE 20 MG PO TABS
20.0000 mg | ORAL_TABLET | Freq: Once | ORAL | Status: AC
  Administered 2020-11-21: 20 mg via ORAL
  Filled 2020-11-21: qty 1

## 2020-11-21 MED ORDER — DIPHENHYDRAMINE HCL 25 MG PO CAPS
25.0000 mg | ORAL_CAPSULE | Freq: Once | ORAL | Status: AC
  Administered 2020-11-21: 25 mg via ORAL
  Filled 2020-11-21: qty 1

## 2020-11-21 MED ORDER — SODIUM CHLORIDE 0.9 % IV SOLN: Freq: Once | INTRAVENOUS | Status: DC

## 2020-11-21 MED ORDER — ACETAMINOPHEN 500 MG PO TABS
1000.0000 mg | ORAL_TABLET | Freq: Once | ORAL | Status: AC
  Administered 2020-11-21: 1000 mg via ORAL
  Filled 2020-11-21: qty 2

## 2020-11-21 MED ORDER — SODIUM CHLORIDE 0.9 % IV SOLN
20.0000 mg | Freq: Once | INTRAVENOUS | Status: AC
  Administered 2020-11-21: 20 mg via INTRAVENOUS
  Filled 2020-11-21: qty 20

## 2020-11-21 NOTE — Patient Instructions (Signed)
[facility]  Discharge Instructions: Thank you for choosing [facility] to provide your oncology and hematology care.   If you have a lab appointment with the Cancer Center, please go directly to the Cancer Center and check in at the registration area.   Wear comfortable clothing and clothing appropriate for easy access to any Portacath or PICC line.   We strive to give you quality time with your provider. You may need to reschedule your appointment if you arrive late (15 or more minutes).  Arriving late affects you and other patients whose appointments are after yours.  Also, if you miss three or more appointments without notifying the office, you may be dismissed from the clinic at the provider's discretion.      For prescription refill requests, have your pharmacy contact our office and allow 72 hours for refills to be completed.    Today you received the following chemotherapy and/or immunotherapy agents carfilzomib   To help prevent nausea and vomiting after your treatment, we encourage you to take your nausea medication as directed.  BELOW ARE SYMPTOMS THAT SHOULD BE REPORTED IMMEDIATELY: . *FEVER GREATER THAN 100.4 F (38 C) OR HIGHER . *CHILLS OR SWEATING . *NAUSEA AND VOMITING THAT IS NOT CONTROLLED WITH YOUR NAUSEA MEDICATION . *UNUSUAL SHORTNESS OF BREATH . *UNUSUAL BRUISING OR BLEEDING . *URINARY PROBLEMS (pain or burning when urinating, or frequent urination) . *BOWEL PROBLEMS (unusual diarrhea, constipation, pain near the anus) . TENDERNESS IN MOUTH AND THROAT WITH OR WITHOUT PRESENCE OF ULCERS (sore throat, sores in mouth, or a toothache) . UNUSUAL RASH, SWELLING OR PAIN  . UNUSUAL VAGINAL DISCHARGE OR ITCHING   Items with * indicate a potential emergency and should be followed up as soon as possible or go to the Emergency Department if any problems should occur.  Please show the CHEMOTHERAPY ALERT CARD or IMMUNOTHERAPY ALERT  CARD at check-in to the Emergency Department and triage nurse.  Should you have questions after your visit or need to cancel or reschedule your appointment, please contact [facility]  Dept: [phone]  and follow the prompts.  Office hours are 8:00 a.m. to 4:30 p.m. Monday - Friday. Please note that voicemails left after 4:00 p.m. may not be returned until the following business day.  We are closed weekends and major holidays. You have access to a nurse at all times for urgent questions. Please call the main number to the clinic Dept: [phone] and follow the prompts.   For any non-urgent questions, you may also contact your provider using MyChart. We now offer e-Visits for anyone 18 and older to request care online for non-urgent symptoms. For details visit [email or web address].   Also download the MyChart app! Go to the app store, search "MyChart", open the app, select , and log in with your MyChart username and password.  Due to Covid, a mask is required upon entering the hospital/clinic. If you do not have a mask, one will be given to you upon arrival. For doctor visits, patients may have 1 support person aged 18 or older with them. For treatment visits, patients cannot have anyone with them due to current Covid guidelines and our immunocompromised population.   

## 2020-11-22 ENCOUNTER — Inpatient Hospital Stay: Payer: BC Managed Care – PPO

## 2020-11-22 VITALS — BP 142/65 | HR 82 | Temp 98.5°F | Resp 16

## 2020-11-22 DIAGNOSIS — C9000 Multiple myeloma not having achieved remission: Secondary | ICD-10-CM

## 2020-11-22 DIAGNOSIS — Z7189 Other specified counseling: Secondary | ICD-10-CM

## 2020-11-22 DIAGNOSIS — Z5112 Encounter for antineoplastic immunotherapy: Secondary | ICD-10-CM | POA: Diagnosis not present

## 2020-11-22 MED ORDER — DIPHENHYDRAMINE HCL 25 MG PO CAPS
25.0000 mg | ORAL_CAPSULE | Freq: Once | ORAL | Status: AC
  Administered 2020-11-22: 25 mg via ORAL
  Filled 2020-11-22: qty 1

## 2020-11-22 MED ORDER — FAMOTIDINE 20 MG PO TABS
20.0000 mg | ORAL_TABLET | Freq: Once | ORAL | Status: AC
  Administered 2020-11-22: 20 mg via ORAL
  Filled 2020-11-22: qty 1

## 2020-11-22 MED ORDER — SODIUM CHLORIDE 0.9 % IV SOLN: Freq: Once | INTRAVENOUS | Status: AC

## 2020-11-22 MED ORDER — HEPARIN SOD (PORK) LOCK FLUSH 100 UNIT/ML IV SOLN
500.0000 [IU] | Freq: Once | INTRAVENOUS | Status: AC | PRN
  Administered 2020-11-22: 500 [IU]

## 2020-11-22 MED ORDER — SODIUM CHLORIDE 0.9 % IV SOLN
20.0000 mg | Freq: Once | INTRAVENOUS | Status: AC
  Administered 2020-11-22: 20 mg via INTRAVENOUS
  Filled 2020-11-22: qty 20

## 2020-11-22 MED ORDER — SODIUM CHLORIDE 0.9% FLUSH
10.0000 mL | INTRAVENOUS | Status: DC | PRN
  Administered 2020-11-22: 10 mL

## 2020-11-22 MED ORDER — ACETAMINOPHEN 500 MG PO TABS
1000.0000 mg | ORAL_TABLET | Freq: Once | ORAL | Status: AC
  Administered 2020-11-22: 1000 mg via ORAL
  Filled 2020-11-22: qty 2

## 2020-11-22 MED ORDER — DEXTROSE 5 % IV SOLN
36.0000 mg/m2 | Freq: Once | INTRAVENOUS | Status: AC
  Administered 2020-11-22: 60 mg via INTRAVENOUS
  Filled 2020-11-22: qty 30

## 2020-11-22 NOTE — Patient Instructions (Signed)
[facility]   Discharge Instructions: Thank you for choosing [facility] to provide your oncology and hematology care.   If you have a lab appointment with the Cancer Center, please go directly to the Cancer Center and check in at the registration area.   Wear comfortable clothing and clothing appropriate for easy access to any Portacath or PICC line.   We strive to give you quality time with your provider. You may need to reschedule your appointment if you arrive late (15 or more minutes).  Arriving late affects you and other patients whose appointments are after yours.  Also, if you miss three or more appointments without notifying the office, you may be dismissed from the clinic at the provider's discretion.      For prescription refill requests, have your pharmacy contact our office and allow 72 hours for refills to be completed.    Today you received the following chemotherapy and/or immunotherapy agents: Carfilzomib (Kyprolis)     To help prevent nausea and vomiting after your treatment, we encourage you to take your nausea medication as directed.  BELOW ARE SYMPTOMS THAT SHOULD BE REPORTED IMMEDIATELY: *FEVER GREATER THAN 100.4 F (38 C) OR HIGHER *CHILLS OR SWEATING *NAUSEA AND VOMITING THAT IS NOT CONTROLLED WITH YOUR NAUSEA MEDICATION *UNUSUAL SHORTNESS OF BREATH *UNUSUAL BRUISING OR BLEEDING *URINARY PROBLEMS (pain or burning when urinating, or frequent urination) *BOWEL PROBLEMS (unusual diarrhea, constipation, pain near the anus) TENDERNESS IN MOUTH AND THROAT WITH OR WITHOUT PRESENCE OF ULCERS (sore throat, sores in mouth, or a toothache) UNUSUAL RASH, SWELLING OR PAIN  UNUSUAL VAGINAL DISCHARGE OR ITCHING   Items with * indicate a potential emergency and should be followed up as soon as possible or go to the Emergency Department if any problems should occur.  Please show the CHEMOTHERAPY ALERT CARD or IMMUNOTHERAPY ALERT CARD  at check-in to the Emergency Department and triage nurse.  Should you have questions after your visit or need to cancel or reschedule your appointment, please contact [facility]  Dept: [phone]  and follow the prompts.  Office hours are 8:00 a.m. to 4:30 p.m. Monday - Friday. Please note that voicemails left after 4:00 p.m. may not be returned until the following business day.  We are closed weekends and major holidays. You have access to a nurse at all times for urgent questions. Please call the main number to the clinic Dept: [phone] and follow the prompts.   For any non-urgent questions, you may also contact your provider using MyChart. We now offer e-Visits for anyone 18 and older to request care online for non-urgent symptoms. For details visit [email or web address].   Also download the MyChart app! Go to the app store, search "MyChart", open the app, select [place], and log in with your MyChart username and password.  Due to Covid, a mask is required upon entering the hospital/clinic. If you do not have a mask, one will be given to you upon arrival. For doctor visits, patients may have 1 support person aged 18 or older with them. For treatment visits, patients cannot have anyone with them due to current Covid guidelines and our immunocompromised population.  

## 2020-11-28 ENCOUNTER — Ambulatory Visit: Payer: BC Managed Care – PPO

## 2020-11-28 ENCOUNTER — Other Ambulatory Visit: Payer: Self-pay

## 2020-11-28 ENCOUNTER — Inpatient Hospital Stay: Payer: BC Managed Care – PPO

## 2020-11-28 ENCOUNTER — Other Ambulatory Visit: Payer: BC Managed Care – PPO

## 2020-11-28 VITALS — BP 124/61 | HR 76 | Temp 97.7°F | Resp 18 | Wt 134.8 lb

## 2020-11-28 DIAGNOSIS — Z7189 Other specified counseling: Secondary | ICD-10-CM

## 2020-11-28 DIAGNOSIS — C9000 Multiple myeloma not having achieved remission: Secondary | ICD-10-CM

## 2020-11-28 DIAGNOSIS — Z5112 Encounter for antineoplastic immunotherapy: Secondary | ICD-10-CM | POA: Diagnosis not present

## 2020-11-28 DIAGNOSIS — Z95828 Presence of other vascular implants and grafts: Secondary | ICD-10-CM

## 2020-11-28 LAB — CMP (CANCER CENTER ONLY)
ALT: 15 U/L (ref 0–44)
AST: 12 U/L — ABNORMAL LOW (ref 15–41)
Albumin: 3.7 g/dL (ref 3.5–5.0)
Alkaline Phosphatase: 57 U/L (ref 38–126)
Anion gap: 8 (ref 5–15)
BUN: 19 mg/dL (ref 6–20)
CO2: 26 mmol/L (ref 22–32)
Calcium: 8.7 mg/dL — ABNORMAL LOW (ref 8.9–10.3)
Chloride: 105 mmol/L (ref 98–111)
Creatinine: 1.11 mg/dL — ABNORMAL HIGH (ref 0.44–1.00)
GFR, Estimated: 57 mL/min — ABNORMAL LOW (ref >60)
Glucose, Bld: 121 mg/dL — ABNORMAL HIGH (ref 70–99)
Potassium: 4.1 mmol/L (ref 3.5–5.1)
Sodium: 139 mmol/L (ref 135–145)
Total Bilirubin: 0.8 mg/dL (ref 0.3–1.2)
Total Protein: 5.6 g/dL — ABNORMAL LOW (ref 6.5–8.1)

## 2020-11-28 LAB — CBC WITH DIFFERENTIAL/PLATELET
Abs Immature Granulocytes: 0.33 10*3/uL — ABNORMAL HIGH (ref 0.00–0.07)
Basophils Absolute: 0 10*3/uL (ref 0.0–0.1)
Basophils Relative: 0 %
Eosinophils Absolute: 0.3 10*3/uL (ref 0.0–0.5)
Eosinophils Relative: 4 %
HCT: 34.4 % — ABNORMAL LOW (ref 36.0–46.0)
Hemoglobin: 11.5 g/dL — ABNORMAL LOW (ref 12.0–15.0)
Immature Granulocytes: 5 %
Lymphocytes Relative: 20 %
Lymphs Abs: 1.2 10*3/uL (ref 0.7–4.0)
MCH: 33.8 pg (ref 26.0–34.0)
MCHC: 33.4 g/dL (ref 30.0–36.0)
MCV: 101.2 fL — ABNORMAL HIGH (ref 80.0–100.0)
Monocytes Absolute: 0.6 10*3/uL (ref 0.1–1.0)
Monocytes Relative: 9 %
Neutro Abs: 3.9 10*3/uL (ref 1.7–7.7)
Neutrophils Relative %: 62 %
Platelets: 106 10*3/uL — ABNORMAL LOW (ref 150–400)
RBC: 3.4 MIL/uL — ABNORMAL LOW (ref 3.87–5.11)
RDW: 14.3 % (ref 11.5–15.5)
WBC: 6.3 10*3/uL (ref 4.0–10.5)
nRBC: 0 % (ref 0.0–0.2)

## 2020-11-28 MED ORDER — DIPHENHYDRAMINE HCL 25 MG PO CAPS
25.0000 mg | ORAL_CAPSULE | Freq: Once | ORAL | Status: AC
  Administered 2020-11-28: 25 mg via ORAL
  Filled 2020-11-28: qty 1

## 2020-11-28 MED ORDER — SODIUM CHLORIDE 0.9% FLUSH
10.0000 mL | Freq: Once | INTRAVENOUS | Status: AC
  Administered 2020-11-28: 10 mL

## 2020-11-28 MED ORDER — SODIUM CHLORIDE 0.9 % IV SOLN: Freq: Once | INTRAVENOUS | Status: AC

## 2020-11-28 MED ORDER — SODIUM CHLORIDE 0.9 % IV SOLN
20.0000 mg | Freq: Once | INTRAVENOUS | Status: AC
  Administered 2020-11-28: 20 mg via INTRAVENOUS
  Filled 2020-11-28: qty 20

## 2020-11-28 MED ORDER — SODIUM CHLORIDE 0.9% FLUSH
10.0000 mL | INTRAVENOUS | Status: DC | PRN
  Administered 2020-11-28: 10 mL

## 2020-11-28 MED ORDER — DEXTROSE 5 % IV SOLN
36.0000 mg/m2 | Freq: Once | INTRAVENOUS | Status: AC
  Administered 2020-11-28: 60 mg via INTRAVENOUS
  Filled 2020-11-28: qty 30

## 2020-11-28 MED ORDER — FAMOTIDINE 20 MG PO TABS
20.0000 mg | ORAL_TABLET | Freq: Once | ORAL | Status: AC
  Administered 2020-11-28: 20 mg via ORAL
  Filled 2020-11-28: qty 1

## 2020-11-28 MED ORDER — REVLIMID 15 MG PO CAPS: ORAL_CAPSULE | ORAL | 0 refills | Status: DC

## 2020-11-28 MED ORDER — ACETAMINOPHEN 500 MG PO TABS
1000.0000 mg | ORAL_TABLET | Freq: Once | ORAL | Status: AC
  Administered 2020-11-28: 1000 mg via ORAL
  Filled 2020-11-28: qty 2

## 2020-11-28 MED ORDER — HEPARIN SOD (PORK) LOCK FLUSH 100 UNIT/ML IV SOLN
500.0000 [IU] | Freq: Once | INTRAVENOUS | Status: AC | PRN
  Administered 2020-11-28: 500 [IU]

## 2020-11-28 NOTE — Patient Instructions (Signed)
[facility]  Discharge Instructions: Thank you for choosing [facility] to provide your oncology and hematology care.   If you have a lab appointment with the Cancer Center, please go directly to the Cancer Center and check in at the registration area.   Wear comfortable clothing and clothing appropriate for easy access to any Portacath or PICC line.   We strive to give you quality time with your provider. You may need to reschedule your appointment if you arrive late (15 or more minutes).  Arriving late affects you and other patients whose appointments are after yours.  Also, if you miss three or more appointments without notifying the office, you may be dismissed from the clinic at the provider's discretion.      For prescription refill requests, have your pharmacy contact our office and allow 72 hours for refills to be completed.    Today you received the following chemotherapy and/or immunotherapy agents kyprolis      To help prevent nausea and vomiting after your treatment, we encourage you to take your nausea medication as directed.  BELOW ARE SYMPTOMS THAT SHOULD BE REPORTED IMMEDIATELY: . *FEVER GREATER THAN 100.4 F (38 C) OR HIGHER . *CHILLS OR SWEATING . *NAUSEA AND VOMITING THAT IS NOT CONTROLLED WITH YOUR NAUSEA MEDICATION . *UNUSUAL SHORTNESS OF BREATH . *UNUSUAL BRUISING OR BLEEDING . *URINARY PROBLEMS (pain or burning when urinating, or frequent urination) . *BOWEL PROBLEMS (unusual diarrhea, constipation, pain near the anus) . TENDERNESS IN MOUTH AND THROAT WITH OR WITHOUT PRESENCE OF ULCERS (sore throat, sores in mouth, or a toothache) . UNUSUAL RASH, SWELLING OR PAIN  . UNUSUAL VAGINAL DISCHARGE OR ITCHING   Items with * indicate a potential emergency and should be followed up as soon as possible or go to the Emergency Department if any problems should occur.  Please show the CHEMOTHERAPY ALERT CARD or IMMUNOTHERAPY ALERT  CARD at check-in to the Emergency Department and triage nurse.  Should you have questions after your visit or need to cancel or reschedule your appointment, please contact [facility]  Dept: [phone]  and follow the prompts.  Office hours are 8:00 a.m. to 4:30 p.m. Monday - Friday. Please note that voicemails left after 4:00 p.m. may not be returned until the following business day.  We are closed weekends and major holidays. You have access to a nurse at all times for urgent questions. Please call the main number to the clinic Dept: [phone] and follow the prompts.   For any non-urgent questions, you may also contact your provider using MyChart. We now offer e-Visits for anyone 18 and older to request care online for non-urgent symptoms. For details visit [email or web address].   Also download the MyChart app! Go to the app store, search "MyChart", open the app, select [facility], and log in with your MyChart username and password.  Due to Covid, a mask is required upon entering the hospital/clinic. If you do not have a mask, one will be given to you upon arrival. For doctor visits, patients may have 1 support person aged 18 or older with them. For treatment visits, patients cannot have anyone with them due to current Covid guidelines and our immunocompromised population.   

## 2020-11-29 ENCOUNTER — Other Ambulatory Visit: Payer: Self-pay | Admitting: Hematology

## 2020-11-29 ENCOUNTER — Inpatient Hospital Stay: Payer: BC Managed Care – PPO

## 2020-11-29 VITALS — BP 150/68 | HR 75 | Temp 98.0°F | Resp 18

## 2020-11-29 DIAGNOSIS — C9000 Multiple myeloma not having achieved remission: Secondary | ICD-10-CM

## 2020-11-29 DIAGNOSIS — Z7189 Other specified counseling: Secondary | ICD-10-CM

## 2020-11-29 DIAGNOSIS — Z5112 Encounter for antineoplastic immunotherapy: Secondary | ICD-10-CM | POA: Diagnosis not present

## 2020-11-29 MED ORDER — DEXTROSE 5 % IV SOLN
36.0000 mg/m2 | Freq: Once | INTRAVENOUS | Status: AC
  Administered 2020-11-29: 60 mg via INTRAVENOUS
  Filled 2020-11-29: qty 30

## 2020-11-29 MED ORDER — SODIUM CHLORIDE 0.9 % IV SOLN
20.0000 mg | Freq: Once | INTRAVENOUS | Status: AC
  Administered 2020-11-29: 20 mg via INTRAVENOUS
  Filled 2020-11-29: qty 20

## 2020-11-29 MED ORDER — DIPHENHYDRAMINE HCL 25 MG PO CAPS
25.0000 mg | ORAL_CAPSULE | Freq: Once | ORAL | Status: AC
  Administered 2020-11-29: 25 mg via ORAL
  Filled 2020-11-29: qty 1

## 2020-11-29 MED ORDER — FAMOTIDINE 20 MG PO TABS
20.0000 mg | ORAL_TABLET | Freq: Once | ORAL | Status: AC
  Administered 2020-11-29: 20 mg via ORAL
  Filled 2020-11-29: qty 1

## 2020-11-29 MED ORDER — HEPARIN SOD (PORK) LOCK FLUSH 100 UNIT/ML IV SOLN
500.0000 [IU] | Freq: Once | INTRAVENOUS | Status: AC | PRN
  Administered 2020-11-29: 500 [IU]

## 2020-11-29 MED ORDER — SODIUM CHLORIDE 0.9 % IV SOLN: Freq: Once | INTRAVENOUS | Status: DC

## 2020-11-29 MED ORDER — SODIUM CHLORIDE 0.9 % IV SOLN: Freq: Once | INTRAVENOUS | Status: AC

## 2020-11-29 MED ORDER — ACETAMINOPHEN 500 MG PO TABS
1000.0000 mg | ORAL_TABLET | Freq: Once | ORAL | Status: AC
  Administered 2020-11-29: 1000 mg via ORAL
  Filled 2020-11-29: qty 2

## 2020-11-29 MED ORDER — SODIUM CHLORIDE 0.9% FLUSH
10.0000 mL | INTRAVENOUS | Status: DC | PRN
  Administered 2020-11-29: 10 mL

## 2020-11-29 NOTE — Patient Instructions (Signed)
[facility]  Discharge Instructions: Thank you for choosing [facility] to provide your oncology and hematology care.   If you have a lab appointment with the Cancer Center, please go directly to the Cancer Center and check in at the registration area.   Wear comfortable clothing and clothing appropriate for easy access to any Portacath or PICC line.   We strive to give you quality time with your provider. You may need to reschedule your appointment if you arrive late (15 or more minutes).  Arriving late affects you and other patients whose appointments are after yours.  Also, if you miss three or more appointments without notifying the office, you may be dismissed from the clinic at the provider's discretion.      For prescription refill requests, have your pharmacy contact our office and allow 72 hours for refills to be completed.    Today you received the following chemotherapy and/or immunotherapy agents kyprolis      To help prevent nausea and vomiting after your treatment, we encourage you to take your nausea medication as directed.  BELOW ARE SYMPTOMS THAT SHOULD BE REPORTED IMMEDIATELY: . *FEVER GREATER THAN 100.4 F (38 C) OR HIGHER . *CHILLS OR SWEATING . *NAUSEA AND VOMITING THAT IS NOT CONTROLLED WITH YOUR NAUSEA MEDICATION . *UNUSUAL SHORTNESS OF BREATH . *UNUSUAL BRUISING OR BLEEDING . *URINARY PROBLEMS (pain or burning when urinating, or frequent urination) . *BOWEL PROBLEMS (unusual diarrhea, constipation, pain near the anus) . TENDERNESS IN MOUTH AND THROAT WITH OR WITHOUT PRESENCE OF ULCERS (sore throat, sores in mouth, or a toothache) . UNUSUAL RASH, SWELLING OR PAIN  . UNUSUAL VAGINAL DISCHARGE OR ITCHING   Items with * indicate a potential emergency and should be followed up as soon as possible or go to the Emergency Department if any problems should occur.  Please show the CHEMOTHERAPY ALERT CARD or IMMUNOTHERAPY ALERT  CARD at check-in to the Emergency Department and triage nurse.  Should you have questions after your visit or need to cancel or reschedule your appointment, please contact [facility]  Dept: [phone]  and follow the prompts.  Office hours are 8:00 a.m. to 4:30 p.m. Monday - Friday. Please note that voicemails left after 4:00 p.m. may not be returned until the following business day.  We are closed weekends and major holidays. You have access to a nurse at all times for urgent questions. Please call the main number to the clinic Dept: [phone] and follow the prompts.   For any non-urgent questions, you may also contact your provider using MyChart. We now offer e-Visits for anyone 18 and older to request care online for non-urgent symptoms. For details visit [email or web address].   Also download the MyChart app! Go to the app store, search "MyChart", open the app, select [facility], and log in with your MyChart username and password.  Due to Covid, a mask is required upon entering the hospital/clinic. If you do not have a mask, one will be given to you upon arrival. For doctor visits, patients may have 1 support person aged 18 or older with them. For treatment visits, patients cannot have anyone with them due to current Covid guidelines and our immunocompromised population.   

## 2020-12-05 ENCOUNTER — Inpatient Hospital Stay: Payer: BC Managed Care – PPO

## 2020-12-05 ENCOUNTER — Inpatient Hospital Stay: Payer: BC Managed Care – PPO | Admitting: Hematology

## 2020-12-05 ENCOUNTER — Other Ambulatory Visit: Payer: Self-pay

## 2020-12-05 ENCOUNTER — Inpatient Hospital Stay: Payer: BC Managed Care – PPO | Attending: Hematology

## 2020-12-05 VITALS — BP 107/69 | HR 85 | Temp 99.0°F | Resp 20 | Wt 137.1 lb

## 2020-12-05 DIAGNOSIS — Z7189 Other specified counseling: Secondary | ICD-10-CM

## 2020-12-05 DIAGNOSIS — C9000 Multiple myeloma not having achieved remission: Secondary | ICD-10-CM | POA: Diagnosis not present

## 2020-12-05 DIAGNOSIS — Z79899 Other long term (current) drug therapy: Secondary | ICD-10-CM | POA: Insufficient documentation

## 2020-12-05 DIAGNOSIS — Z95828 Presence of other vascular implants and grafts: Secondary | ICD-10-CM

## 2020-12-05 DIAGNOSIS — Z87891 Personal history of nicotine dependence: Secondary | ICD-10-CM | POA: Diagnosis not present

## 2020-12-05 DIAGNOSIS — Z5112 Encounter for antineoplastic immunotherapy: Secondary | ICD-10-CM | POA: Diagnosis not present

## 2020-12-05 DIAGNOSIS — Z5111 Encounter for antineoplastic chemotherapy: Secondary | ICD-10-CM

## 2020-12-05 LAB — CBC WITH DIFFERENTIAL/PLATELET
Abs Immature Granulocytes: 0.26 10*3/uL — ABNORMAL HIGH (ref 0.00–0.07)
Basophils Absolute: 0 10*3/uL (ref 0.0–0.1)
Basophils Relative: 1 %
Eosinophils Absolute: 0.4 10*3/uL (ref 0.0–0.5)
Eosinophils Relative: 5 %
HCT: 33.7 % — ABNORMAL LOW (ref 36.0–46.0)
Hemoglobin: 11.7 g/dL — ABNORMAL LOW (ref 12.0–15.0)
Immature Granulocytes: 3 %
Lymphocytes Relative: 16 %
Lymphs Abs: 1.3 10*3/uL (ref 0.7–4.0)
MCH: 34.5 pg — ABNORMAL HIGH (ref 26.0–34.0)
MCHC: 34.7 g/dL (ref 30.0–36.0)
MCV: 99.4 fL (ref 80.0–100.0)
Monocytes Absolute: 1.2 10*3/uL — ABNORMAL HIGH (ref 0.1–1.0)
Monocytes Relative: 15 %
Neutro Abs: 5 10*3/uL (ref 1.7–7.7)
Neutrophils Relative %: 60 %
Platelets: 149 10*3/uL — ABNORMAL LOW (ref 150–400)
RBC: 3.39 MIL/uL — ABNORMAL LOW (ref 3.87–5.11)
RDW: 14.4 % (ref 11.5–15.5)
WBC: 8.2 10*3/uL (ref 4.0–10.5)
nRBC: 0 % (ref 0.0–0.2)

## 2020-12-05 LAB — CMP (CANCER CENTER ONLY)
ALT: 17 U/L (ref 0–44)
AST: 15 U/L (ref 15–41)
Albumin: 3.6 g/dL (ref 3.5–5.0)
Alkaline Phosphatase: 57 U/L (ref 38–126)
Anion gap: 11 (ref 5–15)
BUN: 20 mg/dL (ref 6–20)
CO2: 24 mmol/L (ref 22–32)
Calcium: 8.9 mg/dL (ref 8.9–10.3)
Chloride: 105 mmol/L (ref 98–111)
Creatinine: 1.1 mg/dL — ABNORMAL HIGH (ref 0.44–1.00)
GFR, Estimated: 58 mL/min — ABNORMAL LOW (ref >60)
Glucose, Bld: 102 mg/dL — ABNORMAL HIGH (ref 70–99)
Potassium: 4.3 mmol/L (ref 3.5–5.1)
Sodium: 140 mmol/L (ref 135–145)
Total Bilirubin: 0.7 mg/dL (ref 0.3–1.2)
Total Protein: 5.8 g/dL — ABNORMAL LOW (ref 6.5–8.1)

## 2020-12-05 MED ORDER — SODIUM CHLORIDE 0.9% FLUSH
10.0000 mL | INTRAVENOUS | Status: DC | PRN
  Administered 2020-12-05: 10 mL

## 2020-12-05 MED ORDER — HEPARIN SOD (PORK) LOCK FLUSH 100 UNIT/ML IV SOLN
500.0000 [IU] | Freq: Once | INTRAVENOUS | Status: AC | PRN
  Administered 2020-12-05: 500 [IU]

## 2020-12-05 MED ORDER — CARFILZOMIB CHEMO INJECTION 60 MG
36.0000 mg/m2 | Freq: Once | INTRAVENOUS | Status: AC
  Administered 2020-12-05: 60 mg via INTRAVENOUS
  Filled 2020-12-05: qty 30

## 2020-12-05 MED ORDER — SODIUM CHLORIDE 0.9% FLUSH
10.0000 mL | Freq: Once | INTRAVENOUS | Status: AC
  Administered 2020-12-05: 10 mL

## 2020-12-05 MED ORDER — SODIUM CHLORIDE 0.9 % IV SOLN: Freq: Once | INTRAVENOUS | Status: AC

## 2020-12-05 MED ORDER — SODIUM CHLORIDE 0.9 % IV SOLN
20.0000 mg | Freq: Once | INTRAVENOUS | Status: AC
  Administered 2020-12-05: 20 mg via INTRAVENOUS
  Filled 2020-12-05: qty 20

## 2020-12-05 MED ORDER — ACETAMINOPHEN 500 MG PO TABS
1000.0000 mg | ORAL_TABLET | Freq: Once | ORAL | Status: AC
  Administered 2020-12-05: 1000 mg via ORAL
  Filled 2020-12-05: qty 2

## 2020-12-05 MED ORDER — DIPHENHYDRAMINE HCL 25 MG PO CAPS
25.0000 mg | ORAL_CAPSULE | Freq: Once | ORAL | Status: AC
  Administered 2020-12-05: 25 mg via ORAL
  Filled 2020-12-05: qty 1

## 2020-12-05 MED ORDER — FAMOTIDINE 20 MG PO TABS
20.0000 mg | ORAL_TABLET | Freq: Once | ORAL | Status: AC
  Administered 2020-12-05: 20 mg via ORAL
  Filled 2020-12-05: qty 1

## 2020-12-05 NOTE — Patient Instructions (Signed)
[facility] MEDICAL ONCOLOGY  Discharge Instructions: Thank you for choosing [facility] to provide your oncology and hematology care.   If you have a lab appointment with the Cancer Center, please go directly to the Cancer Center and check in at the registration area.   Wear comfortable clothing and clothing appropriate for easy access to any Portacath or PICC line.   We strive to give you quality time with your provider. You may need to reschedule your appointment if you arrive late (15 or more minutes).  Arriving late affects you and other patients whose appointments are after yours.  Also, if you miss three or more appointments without notifying the office, you may be dismissed from the clinic at the provider's discretion.      For prescription refill requests, have your pharmacy contact our office and allow 72 hours for refills to be completed.    Today you received the following chemotherapy and/or immunotherapy agents: Kyprolis    To help prevent nausea and vomiting after your treatment, we encourage you to take your nausea medication as directed.  BELOW ARE SYMPTOMS THAT SHOULD BE REPORTED IMMEDIATELY: . *FEVER GREATER THAN 100.4 F (38 C) OR HIGHER . *CHILLS OR SWEATING . *NAUSEA AND VOMITING THAT IS NOT CONTROLLED WITH YOUR NAUSEA MEDICATION . *UNUSUAL SHORTNESS OF BREATH . *UNUSUAL BRUISING OR BLEEDING . *URINARY PROBLEMS (pain or burning when urinating, or frequent urination) . *BOWEL PROBLEMS (unusual diarrhea, constipation, pain near the anus) . TENDERNESS IN MOUTH AND THROAT WITH OR WITHOUT PRESENCE OF ULCERS (sore throat, sores in mouth, or a toothache) . UNUSUAL RASH, SWELLING OR PAIN  . UNUSUAL VAGINAL DISCHARGE OR ITCHING   Items with * indicate a potential emergency and should be followed up as soon as possible or go to the Emergency Department if any problems should occur.  Please show the CHEMOTHERAPY ALERT CARD or IMMUNOTHERAPY ALERT  CARD at check-in to the Emergency Department and triage nurse.  Should you have questions after your visit or need to cancel or reschedule your appointment, please contact [facility]  Dept: [phone]  and follow the prompts.  Office hours are 8:00 a.m. to 4:30 p.m. Monday - Friday. Please note that voicemails left after 4:00 p.m. may not be returned until the following business day.  We are closed weekends and major holidays. You have access to a nurse at all times for urgent questions. Please call the main number to the clinic Dept: [phone] and follow the prompts.   For any non-urgent questions, you may also contact your provider using MyChart. We now offer e-Visits for anyone 18 and older to request care online for non-urgent symptoms. For details visit [email or web address].   Also download the MyChart app! Go to the app store, search "MyChart", open the app, select [facility], and log in with your MyChart username and password.  Due to Covid, a mask is required upon entering the hospital/clinic. If you do not have a mask, one will be given to you upon arrival. For doctor visits, patients may have 1 support person aged 18 or older with them. For treatment visits, patients cannot have anyone with them due to current Covid guidelines and our immunocompromised population.   

## 2020-12-06 ENCOUNTER — Inpatient Hospital Stay: Payer: BC Managed Care – PPO

## 2020-12-06 ENCOUNTER — Telehealth: Payer: Self-pay | Admitting: Hematology

## 2020-12-06 VITALS — BP 124/64 | HR 66 | Temp 98.3°F | Resp 18

## 2020-12-06 DIAGNOSIS — Z7189 Other specified counseling: Secondary | ICD-10-CM

## 2020-12-06 DIAGNOSIS — Z5112 Encounter for antineoplastic immunotherapy: Secondary | ICD-10-CM | POA: Diagnosis not present

## 2020-12-06 DIAGNOSIS — C9000 Multiple myeloma not having achieved remission: Secondary | ICD-10-CM

## 2020-12-06 MED ORDER — SODIUM CHLORIDE 0.9 % IV SOLN
20.0000 mg | Freq: Once | INTRAVENOUS | Status: AC
  Administered 2020-12-06: 20 mg via INTRAVENOUS
  Filled 2020-12-06: qty 20

## 2020-12-06 MED ORDER — SODIUM CHLORIDE 0.9% FLUSH
10.0000 mL | INTRAVENOUS | Status: DC | PRN
  Administered 2020-12-06: 10 mL

## 2020-12-06 MED ORDER — FAMOTIDINE 20 MG PO TABS
20.0000 mg | ORAL_TABLET | Freq: Once | ORAL | Status: AC
  Administered 2020-12-06: 20 mg via ORAL
  Filled 2020-12-06: qty 1

## 2020-12-06 MED ORDER — DEXTROSE 5 % IV SOLN
36.0000 mg/m2 | Freq: Once | INTRAVENOUS | Status: AC
  Administered 2020-12-06: 60 mg via INTRAVENOUS
  Filled 2020-12-06: qty 30

## 2020-12-06 MED ORDER — DIPHENHYDRAMINE HCL 25 MG PO CAPS
25.0000 mg | ORAL_CAPSULE | Freq: Once | ORAL | Status: AC
  Administered 2020-12-06: 25 mg via ORAL
  Filled 2020-12-06: qty 1

## 2020-12-06 MED ORDER — SODIUM CHLORIDE 0.9 % IV SOLN: Freq: Once | INTRAVENOUS | Status: AC

## 2020-12-06 MED ORDER — HEPARIN SOD (PORK) LOCK FLUSH 100 UNIT/ML IV SOLN
500.0000 [IU] | Freq: Once | INTRAVENOUS | Status: AC | PRN
  Administered 2020-12-06: 500 [IU]

## 2020-12-06 MED ORDER — ACETAMINOPHEN 500 MG PO TABS
1000.0000 mg | ORAL_TABLET | Freq: Once | ORAL | Status: AC
  Administered 2020-12-06: 1000 mg via ORAL
  Filled 2020-12-06: qty 2

## 2020-12-06 NOTE — Telephone Encounter (Signed)
Scheduled follow-up appointments per 9/1 los. Patient is aware. 

## 2020-12-06 NOTE — Patient Instructions (Signed)
[facility]COL[facility]  Discharge Instructions: Thank you for choosing [facility] to provide your oncology and hematology care.   If you have a lab appointment with the Cancer Center, please go directly to the Cancer Center and check in at the registration area.   Wear comfortable clothing and clothing appropriate for easy access to any Portacath or PICC line.   We strive to give you quality time with your provider. You may need to reschedule your appointment if you arrive late (15 or more minutes).  Arriving late affects you and other patients whose appointments are after yours.  Also, if you miss three or more appointments without notifying the office, you may be dismissed from the clinic at the provider's discretion.      For prescription refill requests, have your pharmacy contact our office and allow 72 hours for refills to be completed.    Today you received the following chemotherapy and/or immunotherapy agents kyprolis      To help prevent nausea and vomiting after your treatment, we encourage you to take your nausea medication as directed.  BELOW ARE SYMPTOMS THAT SHOULD BE REPORTED IMMEDIATELY: . *FEVER GREATER THAN 100.4 F (38 C) OR HIGHER . *CHILLS OR SWEATING . *NAUSEA AND VOMITING THAT IS NOT CONTROLLED WITH YOUR NAUSEA MEDICATION . *UNUSUAL SHORTNESS OF BREATH . *UNUSUAL BRUISING OR BLEEDING . *URINARY PROBLEMS (pain or burning when urinating, or frequent urination) . *BOWEL PROBLEMS (unusual diarrhea, constipation, pain near the anus) . TENDERNESS IN MOUTH AND THROAT WITH OR WITHOUT PRESENCE OF ULCERS (sore throat, sores in mouth, or a toothache) . UNUSUAL RASH, SWELLING OR PAIN  . UNUSUAL VAGINAL DISCHARGE OR ITCHING   Items with * indicate a potential emergency and should be followed up as soon as possible or go to the Emergency Department if any problems should occur.  Please show the CHEMOTHERAPY ALERT CARD or IMMUNOTHERAPY ALERT  CARD at check-in to the Emergency Department and triage nurse.  Should you have questions after your visit or need to cancel or reschedule your appointment, please contact [facility]COL[facility]  Dept: [phone]  and follow the prompts.  Office hours are 8:00 a.m. to 4:30 p.m. Monday - Friday. Please note that voicemails left after 4:00 p.m. may not be returned until the following business day.  We are closed weekends and major holidays. You have access to a nurse at all times for urgent questions. Please call the main number to the clinic Dept: [phone] and follow the prompts.   For any non-urgent questions, you may also contact your provider using MyChart. We now offer e-Visits for anyone 18 and older to request care online for non-urgent symptoms. For details visit [email or web address].   Also download the MyChart app! Go to the app store, search "MyChart", open the app, select [facility] [facility], and log in with your MyChart username and password.  Due to Covid, a mask is required upon entering the hospital/clinic. If you do not have a mask, one will be given to you upon arrival. For doctor visits, patients may have 1 support person aged 18 or older with them. For treatment visits, patients cannot have anyone with them due to current Covid guidelines and our immunocompromised population.   

## 2020-12-10 ENCOUNTER — Telehealth: Payer: Self-pay | Admitting: Hematology

## 2020-12-10 NOTE — Telephone Encounter (Signed)
Scheduled follow-up appointments per 9/1 los. Patient is aware. 

## 2020-12-11 ENCOUNTER — Encounter: Payer: Self-pay | Admitting: Hematology

## 2020-12-11 NOTE — Progress Notes (Signed)
HEMATOLOGY/ONCOLOGY CONSULTATION NOTE  Date of Service: 12/11/2020  Patient Care Team: [name], MD as PCP - General (Family Medicine) [name], [name], MD as PCP - Cardiology (Cardiology) [name] [name], MD as Consulting Physician (Gastroenterology)  CHIEF COMPLAINTS/PURPOSE OF CONSULTATION:  Multiple Myeloma  HISTORY OF PRESENTING ILLNESS:   Megan Velasquez is a wonderful 60 y.o. female who has been referred to [place] for evaluation and management of multiple myeloma. Pt is accompanied today by her husband for this visit.    Patient was referred to [place] for second opinion regarding management of newly diagnosed multiple myeloma by Dr. Laurena Slimmer MD.  Patient had been referred to to [facility] by her primary care physician for anemia and elevated serum protein levels.  She also had some increasing fatigue and tingling in her hands and feet.  Noted primarily in the right wrist and right ankle.  B12 levels were noted to be borderline low.  She had a work-up including labs which showed hemoglobin of 9.7 with an MCV of 86.9 WBC count of 6.95k and platelets of 165k. CMP showed a creatinine of 1.06 calcium level of 8.9 total protein of 10.2 with an albumin of 2.7 normal liver function tests and other electrolytes. Serum protein electrophoresis with an M spike of 4.3 with an additional M spike of 0.2 g/dL.  IgA quantitative more than 6400. B12 levels 276, homocystine 15.9 Serum folate 26.5 LDH 96 Beta-2 microglobulin of 2.5  Serum kappa lambda free light chains showed free kappa light chains of 181, free lambda light chains of 3.6 and a free kappa lambda ratio of 50.3.  24-hour UPEP showed no M spike and a total protein of 5.7 mg/dL.  Bone Survey completed on 03/08/2020 with results revealing "No evidence of lytic nor blastic lesions within the appendicular or axial skeleton. Degenerative changes as described above."   On review of systems, pt reports some mild fatigue.  No  focal bone pains.  No chest pain.  No shortness of breath. No evidence of GI bleeding no hematuria no nosebleeds or gum bleeds. No other acute new focal symptoms. Denies any history of known cardiopulmonary disease strokes or heart attacks.  INTERVAL HISTORY  Megan Velasquez is a wonderful 59 y.o. female who is here today for evaluation and management of multiple myeloma. The patient's last visit with [place] was on 10/25/2020. The pt reports that she is doing well overall.   She is here for follow-up prior to her continued management of myeloma with KRD.  She has been evaluated at [facility] with Dr. Amalia Hailey and the transplant team and is in the process of deciding whether she will be proceeding for a transplant.  She has additional questions about the transplant which I try to answer and also recommended she get in touch with Dr. Amalia Hailey to help make a final decision.  Her last myeloma panel at [facility] on 12/03/2020 showed unmeasurable M spike with possible positive IFE Mild intermittent hand cramping from Revlimid.  Still disinclined to consider Covid 19 vaccine and Evusheld.  Lab results today were reviewed with the patient  On review of systems, pt reports no infection issues or other acute issues.   MEDICAL HISTORY:  Past Medical History:  Diagnosis Date   Cancer ([place])    GERD (gastroesophageal reflux disease)    History of kidney stones    HTN (hypertension)    Renal disorder     SURGICAL HISTORY: Past Surgical History:  Procedure Laterality Date   CHOLECYSTECTOMY     COLONOSCOPY  01/2015   Dr. Britta Mccreedy: Mild diverticulosis, sessile polyp ranging 3 to 5 mm removed from the proximal transverse colon, semi-pedunculated polyp 5 to 9 mm in size removed from the sigmoid colon.  Sigmoid colon polyp was serrated adenoma, transverse colon polyp was adenomatous.  Patient was told to have another colonoscopy in 3 years.   COLONOSCOPY WITH PROPOFOL N/A 10/11/2017   Procedure:  COLONOSCOPY WITH PROPOFOL;  Surgeon: Daneil Dolin, MD;  Location: [facility];  Service: Endoscopy;  Laterality: N/A;  1:15pm   IR IMAGING GUIDED PORT INSERTION  06/05/2020   POLYPECTOMY  10/11/2017   Procedure: POLYPECTOMY;  Surgeon: Daneil Dolin, MD;  Location: [facility];  Service: Endoscopy;;  descending colon polyps cs times 2    SOCIAL HISTORY: Social History   Socioeconomic History   Marital status: Married    Spouse name: Not on file   Number of children: Not on file   Years of education: Not on file   Highest education level: Not on file  Occupational History   Not on file  Tobacco Use   Smoking status: Former    Packs/day: 1.00    Years: 28.00    Pack years: 28.00    Types: Cigarettes    Quit date: 10/08/2013    Years since quitting: 7.1   Smokeless tobacco: Never  Vaping Use   Vaping Use: Never used  Substance and Sexual Activity   Alcohol use: Not Currently   Drug use: Never   Sexual activity: Yes    Birth control/protection: Post-menopausal  Other Topics Concern   Not on file  Social History Narrative   Not on file   Social Determinants of Health   Financial Resource Strain: Low Risk    Difficulty of Paying Living Expenses: Not hard at all  Food Insecurity: No Food Insecurity   Worried About Charity fundraiser in the Last Year: Never true   [place] in the Last Year: Never true  Transportation Needs: No Transportation Needs   Lack of Transportation (Medical): No   Lack of Transportation (Non-Medical): No  Physical Activity: Not on file  Stress: No Stress Concern Present   Feeling of Stress : Only a little  Social Connections: Unknown   Frequency of Communication with Friends and Family: More than three times a week   Frequency of Social Gatherings with Friends and Family: More than three times a week   Attends Religious Services: Not on Electrical engineer or Organizations: Not on file   Attends Archivist Meetings:  Not on file   Marital Status: Married  Human resources officer Violence: Not on file    FAMILY HISTORY: Family History  Problem Relation Age of Onset   Heart disease Mother    Diabetes Mother    Lung cancer Father    COPD Father    Colon cancer Neg Hx     ALLERGIES:  has No Known Allergies.  MEDICATIONS:  Current Outpatient Medications  Medication Sig Dispense Refill   acetaminophen (TYLENOL) 500 MG tablet Take 1,000 mg by mouth every 6 (six) hours as needed for fever or headache.     acyclovir (ZOVIRAX) 400 MG tablet TAKE 1 TABLET BY MOUTH TWICE A DAY 30 tablet 2   Ascorbic Acid (VITAMIN C) 1000 MG tablet Take 1,000 mg by mouth every morning.     b complex vitamins capsule  Take 1 capsule by mouth daily. (Patient taking differently: Take 1 capsule by mouth every morning.) 30 capsule 3   chlorhexidine (PERIDEX) 0.12 % solution SMARTSIG:By Mouth     Cholecalciferol (VITAMIN D3) 5000 units CAPS Take 5,000 Units by mouth every morning.     Cyanocobalamin (VITAMIN B12) 3000 MCG SUBL Place 3,000 mcg under the tongue every morning.     dexamethasone (DECADRON) 4 MG tablet TAKE 5 TABLETS BY MOUTH ONCE A WEEK. 40 tablet 2   ELIQUIS 2.5 MG TABS tablet TAKE 1 TABLET BY MOUTH TWICE A DAY 60 tablet 5   lidocaine-prilocaine (EMLA) cream Apply to affected area once (Patient taking differently: Apply 1 application topically once as needed (prior to port access).) 30 g 3   loratadine (CLARITIN) 10 MG tablet Take 10 mg by mouth every morning.     LORazepam (ATIVAN) 0.5 MG tablet Take 1 tablet (0.5 mg total) by mouth every 6 (six) hours as needed (Nausea or vomiting). (Patient not taking: Reported on 07/04/2020) 30 tablet 0   omeprazole (PRILOSEC) 40 MG capsule Take 40 mg by mouth daily.     ondansetron (ZOFRAN) 8 MG tablet Take 1 tablet (8 mg total) by mouth 2 (two) times daily as needed (Nausea or vomiting). 30 tablet 1   prochlorperazine (COMPAZINE) 10 MG tablet Take 1 tablet (10 mg total) by mouth every 6  (six) hours as needed (Nausea or vomiting). 30 tablet 1   REVLIMID 15 MG capsule TAKE 1 CAPSULE BY MOUTH ONCE DAILY AT BEDTIME FOR 21 DAYS ON AND 7 DAYS OFF 21 capsule 0   No current facility-administered medications for this visit.    REVIEW OF SYSTEMS:   .10 Point review of Systems was done is negative except as noted above.   PHYSICAL EXAMINATION: ECOG PERFORMANCE STATUS: 1 - Symptomatic but completely ambulatory  NAD . GENERAL:alert, in no acute distress and comfortable SKIN: no acute rashes, no significant lesions EYES: conjunctiva are pink and non-injected, sclera anicteric OROPHARYNX: MMM, no exudates, no oropharyngeal erythema or ulceration NECK: supple, no JVD LYMPH:  no palpable lymphadenopathy in the cervical, axillary or inguinal regions LUNGS: clear to auscultation b/l with normal respiratory effort HEART: regular rate & rhythm ABDOMEN:  normoactive bowel sounds , non tender, not distended. Extremity: no pedal edema PSYCH: alert & oriented x 3 with fluent speech NEURO: no focal motor/sensory deficits   LABORATORY DATA:  I have reviewed the data as listed  . CBC Latest Ref Rng & Units 12/05/2020 11/28/2020 11/21/2020  WBC 4.0 - 10.5 K/uL 8.2 6.3 7.5  Hemoglobin 12.0 - 15.0 g/dL 11.7(L) 11.5(L) 12.3  Hematocrit 36.0 - 46.0 % 33.7(L) 34.4(L) 36.0  Platelets 150 - 400 K/uL 149(L) 106(L) 242    . CMP Latest Ref Rng & Units 12/05/2020 11/28/2020 11/21/2020  Glucose 70 - 99 mg/dL 102(H) 121(H) 112(H)  BUN 6 - 20 mg/dL _0 Creatinine 0.44 - 1.00 mg/dL 1.10(H) 1.11(H) 0.82  Sodium 135 - 145 mmol/L 140 139 139  Potassium 3.5 - 5.1 mmol/L 4.3 4.1 3.5  Chloride 98 - 111 mmol/L 105 105 105  CO2 22 - 32 mmol/L _1 Calcium 8.9 - 10.3 mg/dL 8.9 8.7(L) 9.0  Total Protein 6.5 - 8.1 g/dL 5.8(L) 5.6(L) 6.0(L)  Total Bilirubin 0.3 - 1.2 mg/dL 0.7 0.8 0.8  Alkaline Phos 38 - 126 U/L 57 57 57  AST 15 - 41 U/L 15 12(L) 15  ALT 0 - 44 U/L 17 15 15  Most recent lab  results (03/15/2020) of CBC is as follows: all values are WNL except for RBC at 3.66, Hgb at 9.7, HCT at 31.8, MCHC at 30.5, RDW Standard Deviation 58.7, Red Cell Distribution Width At 18.6,  BUN at 19, Creatinine at 1.06, Total Protein at 10.2, Albumin at 2.7, AST at 14,. 03/13/2020 UPEP shows no M-Spike, all values are WNL 03/06/2020 SPEP shows Total Protein at 10.1, Beta Globulin at 5.2, M-Spike at 4.3, Total Globulin at 6.4, A/G Ratio at 0.6 03/06/2020 Free Kappa Light Chains at 181.1, Free Lambda Light Chains Serum at 3.6, K/L ratio at 50.31 03/06/2020 Beta-2 Microglobulin at 2.5           RADIOGRAPHIC STUDIES: I have personally reviewed the radiological images as listed and agreed with the findings in the report. No results found.  ASSESSMENT & PLAN:   60 year old female with  #1 Multiple myeloma -IgA kappa.  Presenting with anemia hemoglobin 9.4. Creatinine 1.28 No hypercalcemia Bone survey with no obvious skeletal lesions. Baseline M spike of 4.3 g/dL. Beta-2 Microglobulin at 2.5 Mol Cy Monosomy 13 and dup 1q   PLAN: -Discussed pt labwork today, 12/05/2020; CBC and CMP stable -Recent myeloma panel on 12/03/2020 at [facility] showed no measurable M spike but positive IFE -She is in discussions with Dr. Amalia Hailey at the transplant team at [facility] prior to making a final decision regarding whether she will proceed for consolidation with an autologous bone marrow transplant --Continue masking and aggressive COVID prevention strategies. -Will keep Carfilzomib dose at 36 mg/m^2. T -mild hand cramps from Revlimid continue @ 74m -The pt has no prohibitive toxicities from continuing C6D2 Carfilzomib / Revlimid -Continue 2.5 mg Eliquis BID.  FOLLOW UP: Please schedule cycle 8 of KRD as per orders. Port flush and labs on day 1 8 and 15 of each treatment cycle. MD visit with Dr. KIrene Limboon cycle 8-day 8   . The total time spent in the appointment was 32 minutes and more than 50%  was on counseling and direct patient cares.    GSullivan LoneMD MBerwynAAHIVMS SKindred Hospital - San Antonio CentralCAshley Valley Medical CenterHematology/Oncology Physician CDoctors Medical [facility] (Office):       [phone](Work cell):  [phone](Fax):           [phone]

## 2020-12-13 ENCOUNTER — Other Ambulatory Visit: Payer: Self-pay | Admitting: Hematology

## 2020-12-13 DIAGNOSIS — C9000 Multiple myeloma not having achieved remission: Secondary | ICD-10-CM

## 2020-12-13 DIAGNOSIS — Z7189 Other specified counseling: Secondary | ICD-10-CM

## 2020-12-19 ENCOUNTER — Other Ambulatory Visit: Payer: Self-pay

## 2020-12-19 ENCOUNTER — Inpatient Hospital Stay: Payer: BC Managed Care – PPO

## 2020-12-19 ENCOUNTER — Other Ambulatory Visit: Payer: Self-pay | Admitting: Hematology

## 2020-12-19 VITALS — BP 148/76 | HR 73 | Temp 98.1°F | Resp 18 | Wt 138.0 lb

## 2020-12-19 DIAGNOSIS — C9000 Multiple myeloma not having achieved remission: Secondary | ICD-10-CM

## 2020-12-19 DIAGNOSIS — Z5112 Encounter for antineoplastic immunotherapy: Secondary | ICD-10-CM | POA: Diagnosis not present

## 2020-12-19 DIAGNOSIS — Z7189 Other specified counseling: Secondary | ICD-10-CM

## 2020-12-19 DIAGNOSIS — Z95828 Presence of other vascular implants and grafts: Secondary | ICD-10-CM

## 2020-12-19 LAB — CBC WITH DIFFERENTIAL/PLATELET
Abs Immature Granulocytes: 0.06 10*3/uL (ref 0.00–0.07)
Basophils Absolute: 0.1 10*3/uL (ref 0.0–0.1)
Basophils Relative: 2 %
Eosinophils Absolute: 0.2 10*3/uL (ref 0.0–0.5)
Eosinophils Relative: 3 %
HCT: 35.3 % — ABNORMAL LOW (ref 36.0–46.0)
Hemoglobin: 11.7 g/dL — ABNORMAL LOW (ref 12.0–15.0)
Immature Granulocytes: 1 %
Lymphocytes Relative: 20 %
Lymphs Abs: 1.4 10*3/uL (ref 0.7–4.0)
MCH: 33.2 pg (ref 26.0–34.0)
MCHC: 33.1 g/dL (ref 30.0–36.0)
MCV: 100.3 fL — ABNORMAL HIGH (ref 80.0–100.0)
Monocytes Absolute: 0.8 10*3/uL (ref 0.1–1.0)
Monocytes Relative: 12 %
Neutro Abs: 4.5 10*3/uL (ref 1.7–7.7)
Neutrophils Relative %: 62 %
Platelets: 210 10*3/uL (ref 150–400)
RBC: 3.52 MIL/uL — ABNORMAL LOW (ref 3.87–5.11)
RDW: 14.2 % (ref 11.5–15.5)
WBC: 7.1 10*3/uL (ref 4.0–10.5)
nRBC: 0 % (ref 0.0–0.2)

## 2020-12-19 LAB — CMP (CANCER CENTER ONLY)
ALT: 16 U/L (ref 0–44)
AST: 14 U/L — ABNORMAL LOW (ref 15–41)
Albumin: 3.9 g/dL (ref 3.5–5.0)
Alkaline Phosphatase: 65 U/L (ref 38–126)
Anion gap: 12 (ref 5–15)
BUN: 23 mg/dL — ABNORMAL HIGH (ref 6–20)
CO2: 22 mmol/L (ref 22–32)
Calcium: 8.9 mg/dL (ref 8.9–10.3)
Chloride: 105 mmol/L (ref 98–111)
Creatinine: 0.88 mg/dL (ref 0.44–1.00)
GFR, Estimated: >60 mL/min (ref >60)
Glucose, Bld: 131 mg/dL — ABNORMAL HIGH (ref 70–99)
Potassium: 3.6 mmol/L (ref 3.5–5.1)
Sodium: 139 mmol/L (ref 135–145)
Total Bilirubin: 0.7 mg/dL (ref 0.3–1.2)
Total Protein: 6 g/dL — ABNORMAL LOW (ref 6.5–8.1)

## 2020-12-19 MED ORDER — DEXTROSE 5 % IV SOLN
36.0000 mg/m2 | Freq: Once | INTRAVENOUS | Status: AC
  Administered 2020-12-19: 60 mg via INTRAVENOUS
  Filled 2020-12-19: qty 30

## 2020-12-19 MED ORDER — SODIUM CHLORIDE 0.9% FLUSH
10.0000 mL | INTRAVENOUS | Status: DC | PRN
  Administered 2020-12-19: 10 mL

## 2020-12-19 MED ORDER — SODIUM CHLORIDE 0.9 % IV SOLN: Freq: Once | INTRAVENOUS | Status: AC

## 2020-12-19 MED ORDER — SODIUM CHLORIDE 0.9 % IV SOLN
20.0000 mg | Freq: Once | INTRAVENOUS | Status: AC
  Administered 2020-12-19: 20 mg via INTRAVENOUS
  Filled 2020-12-19: qty 20

## 2020-12-19 MED ORDER — ACETAMINOPHEN 500 MG PO TABS
1000.0000 mg | ORAL_TABLET | Freq: Once | ORAL | Status: AC
  Administered 2020-12-19: 1000 mg via ORAL
  Filled 2020-12-19: qty 2

## 2020-12-19 MED ORDER — SODIUM CHLORIDE 0.9% FLUSH
10.0000 mL | Freq: Once | INTRAVENOUS | Status: AC
  Administered 2020-12-19: 10 mL

## 2020-12-19 MED ORDER — FAMOTIDINE 20 MG PO TABS
20.0000 mg | ORAL_TABLET | Freq: Once | ORAL | Status: AC
  Administered 2020-12-19: 20 mg via ORAL
  Filled 2020-12-19: qty 1

## 2020-12-19 MED ORDER — HEPARIN SOD (PORK) LOCK FLUSH 100 UNIT/ML IV SOLN
500.0000 [IU] | Freq: Once | INTRAVENOUS | Status: AC | PRN
  Administered 2020-12-19: 500 [IU]

## 2020-12-19 MED ORDER — DIPHENHYDRAMINE HCL 25 MG PO CAPS
25.0000 mg | ORAL_CAPSULE | Freq: Once | ORAL | Status: AC
  Administered 2020-12-19: 25 mg via ORAL
  Filled 2020-12-19: qty 1

## 2020-12-19 NOTE — Patient Instructions (Signed)
[facility]COL[facility]  Discharge Instructions: Thank you for choosing [facility] to provide your oncology and hematology care.   If you have a lab appointment with the Cancer Center, please go directly to the Cancer Center and check in at the registration area.   Wear comfortable clothing and clothing appropriate for easy access to any Portacath or PICC line.   We strive to give you quality time with your provider. You may need to reschedule your appointment if you arrive late (15 or more minutes).  Arriving late affects you and other patients whose appointments are after yours.  Also, if you miss three or more appointments without notifying the office, you may be dismissed from the clinic at the provider's discretion.      For prescription refill requests, have your pharmacy contact our office and allow 72 hours for refills to be completed.    Today you received the following chemotherapy and/or immunotherapy agents kyprolis      To help prevent nausea and vomiting after your treatment, we encourage you to take your nausea medication as directed.  BELOW ARE SYMPTOMS THAT SHOULD BE REPORTED IMMEDIATELY: . *FEVER GREATER THAN 100.4 F (38 C) OR HIGHER . *CHILLS OR SWEATING . *NAUSEA AND VOMITING THAT IS NOT CONTROLLED WITH YOUR NAUSEA MEDICATION . *UNUSUAL SHORTNESS OF BREATH . *UNUSUAL BRUISING OR BLEEDING . *URINARY PROBLEMS (pain or burning when urinating, or frequent urination) . *BOWEL PROBLEMS (unusual diarrhea, constipation, pain near the anus) . TENDERNESS IN MOUTH AND THROAT WITH OR WITHOUT PRESENCE OF ULCERS (sore throat, sores in mouth, or a toothache) . UNUSUAL RASH, SWELLING OR PAIN  . UNUSUAL VAGINAL DISCHARGE OR ITCHING   Items with * indicate a potential emergency and should be followed up as soon as possible or go to the Emergency Department if any problems should occur.  Please show the CHEMOTHERAPY ALERT CARD or IMMUNOTHERAPY ALERT  CARD at check-in to the Emergency Department and triage nurse.  Should you have questions after your visit or need to cancel or reschedule your appointment, please contact [facility]  Dept: [phone]  and follow the prompts.  Office hours are 8:00 a.m. to 4:30 p.m. Monday - Friday. Please note that voicemails left after 4:00 p.m. may not be returned until the following business day.  We are closed weekends and major holidays. You have access to a nurse at all times for urgent questions. Please call the main number to the clinic Dept: [phone] and follow the prompts.   For any non-urgent questions, you may also contact your provider using MyChart. We now offer e-Visits for anyone 18 and older to request care online for non-urgent symptoms. For details visit [email or web address].   Also download the MyChart app! Go to the app store, search "MyChart", open the app, select [place], and log in with your MyChart username and password.  Due to Covid, a mask is required upon entering the hospital/clinic. If you do not have a mask, one will be given to you upon arrival. For doctor visits, patients may have 1 support person aged 18 or older with them. For treatment visits, patients cannot have anyone with them due to current Covid guidelines and our immunocompromised population.   

## 2020-12-20 ENCOUNTER — Inpatient Hospital Stay: Payer: BC Managed Care – PPO

## 2020-12-20 VITALS — BP 108/78 | HR 73 | Temp 98.8°F | Resp 16

## 2020-12-20 DIAGNOSIS — Z7189 Other specified counseling: Secondary | ICD-10-CM

## 2020-12-20 DIAGNOSIS — C9000 Multiple myeloma not having achieved remission: Secondary | ICD-10-CM

## 2020-12-20 DIAGNOSIS — Z5112 Encounter for antineoplastic immunotherapy: Secondary | ICD-10-CM | POA: Diagnosis not present

## 2020-12-20 MED ORDER — DIPHENHYDRAMINE HCL 25 MG PO CAPS
25.0000 mg | ORAL_CAPSULE | Freq: Once | ORAL | Status: AC
  Administered 2020-12-20: 25 mg via ORAL
  Filled 2020-12-20: qty 1

## 2020-12-20 MED ORDER — SODIUM CHLORIDE 0.9% FLUSH
10.0000 mL | INTRAVENOUS | Status: DC | PRN
  Administered 2020-12-20: 10 mL

## 2020-12-20 MED ORDER — SODIUM CHLORIDE 0.9 % IV SOLN
20.0000 mg | Freq: Once | INTRAVENOUS | Status: AC
  Administered 2020-12-20: 20 mg via INTRAVENOUS
  Filled 2020-12-20: qty 20

## 2020-12-20 MED ORDER — FAMOTIDINE 20 MG PO TABS
20.0000 mg | ORAL_TABLET | Freq: Once | ORAL | Status: AC
  Administered 2020-12-20: 20 mg via ORAL
  Filled 2020-12-20: qty 1

## 2020-12-20 MED ORDER — DEXTROSE 5 % IV SOLN
36.0000 mg/m2 | Freq: Once | INTRAVENOUS | Status: AC
  Administered 2020-12-20: 60 mg via INTRAVENOUS
  Filled 2020-12-20: qty 30

## 2020-12-20 MED ORDER — HEPARIN SOD (PORK) LOCK FLUSH 100 UNIT/ML IV SOLN
500.0000 [IU] | Freq: Once | INTRAVENOUS | Status: AC | PRN
  Administered 2020-12-20: 500 [IU]

## 2020-12-20 MED ORDER — DEXTROSE 5 % IV SOLN: 36.0000 mg/m2 | Freq: Once | INTRAVENOUS | Status: DC

## 2020-12-20 MED ORDER — SODIUM CHLORIDE 0.9 % IV SOLN: Freq: Once | INTRAVENOUS | Status: AC

## 2020-12-20 MED ORDER — ACETAMINOPHEN 500 MG PO TABS
1000.0000 mg | ORAL_TABLET | Freq: Once | ORAL | Status: AC
  Administered 2020-12-20: 1000 mg via ORAL
  Filled 2020-12-20: qty 2

## 2020-12-20 NOTE — Patient Instructions (Signed)
[facility]ICAL [facility]COLOG[facility]  Discharge Instructions: Thank you for choosing [facility] to provide your oncology and hematology care.   If you have a lab appointment with the Cancer Center, please go directly to the Cancer Center and check in at the registration area.   Wear comfortable clothing and clothing appropriate for easy access to any Portacath or PICC line.   We strive to give you quality time with your provider. You may need to reschedule your appointment if you arrive late (15 or more minutes).  Arriving late affects you and other patients whose appointments are after yours.  Also, if you miss three or more appointments without notifying the office, you may be dismissed from the clinic at the provider's discretion.      For prescription refill requests, have your pharmacy contact our office and allow 72 hours for refills to be completed.    Today you received the following chemotherapy and/or immunotherapy agents carfilzomib   To help prevent nausea and vomiting after your treatment, we encourage you to take your nausea medication as directed.  BELOW ARE SYMPTOMS THAT SHOULD BE REPORTED IMMEDIATELY: . *FEVER GREATER THAN 100.4 F (38 C) OR HIGHER . *CHILLS OR SWEATING . *NAUSEA AND VOMITING THAT IS NOT CONTROLLED WITH YOUR NAUSEA MEDICATION . *UNUSUAL SHORTNESS OF BREATH . *UNUSUAL BRUISING OR BLEEDING . *URINARY PROBLEMS (pain or burning when urinating, or frequent urination) . *BOWEL PROBLEMS (unusual diarrhea, constipation, pain near the anus) . TENDERNESS IN MOUTH AND THROAT WITH OR WITHOUT PRESENCE OF ULCERS (sore throat, sores in mouth, or a toothache) . UNUSUAL RASH, SWELLING OR PAIN  . UNUSUAL VAGINAL DISCHARGE OR ITCHING   Items with * indicate a potential emergency and should be followed up as soon as possible or go to the Emergency Department if any problems should occur.  Please show the CHEMOTHERAPY ALERT CARD or IMMUNOTHERAPY ALERT  CARD at check-in to the Emergency Department and triage nurse.  Should you have questions after your visit or need to cancel or reschedule your appointment, please contact [facility]  Dept: [phone]  and follow the prompts.  Office hours are 8:00 a.m. to 4:30 p.m. Monday - Friday. Please note that voicemails left after 4:00 p.m. may not be returned until the following business day.  We are closed weekends and major holidays. You have access to a nurse at all times for urgent questions. Please call the main number to the clinic Dept: [phone] and follow the prompts.   For any non-urgent questions, you may also contact your provider using MyChart. We now offer e-Visits for anyone 18 and older to request care online for non-urgent symptoms. For details visit [email or web address].   Also download the MyChart app! Go to the app store, search "MyChart", open the app, select [facility], and log in with your MyChart username and password.  Due to Covid, a mask is required upon entering the hospital/clinic. If you do not have a mask, one will be given to you upon arrival. For doctor visits, patients may have 1 support person aged 18 or older with them. For treatment visits, patients cannot have anyone with them due to current Covid guidelines and our immunocompromised population.   

## 2020-12-24 ENCOUNTER — Other Ambulatory Visit: Payer: Self-pay

## 2020-12-24 DIAGNOSIS — C9000 Multiple myeloma not having achieved remission: Secondary | ICD-10-CM

## 2020-12-24 DIAGNOSIS — Z7189 Other specified counseling: Secondary | ICD-10-CM

## 2020-12-24 MED ORDER — REVLIMID 15 MG PO CAPS: ORAL_CAPSULE | ORAL | 0 refills | Status: DC

## 2020-12-25 MED FILL — Dexamethasone Sodium Phosphate Inj 100 MG/10ML: INTRAMUSCULAR | Qty: 2 | Status: AC

## 2020-12-26 ENCOUNTER — Inpatient Hospital Stay: Payer: BC Managed Care – PPO

## 2020-12-26 ENCOUNTER — Other Ambulatory Visit: Payer: Self-pay

## 2020-12-26 ENCOUNTER — Inpatient Hospital Stay (HOSPITAL_BASED_OUTPATIENT_CLINIC_OR_DEPARTMENT_OTHER): Payer: BC Managed Care – PPO | Admitting: Hematology

## 2020-12-26 VITALS — BP 130/75 | HR 81 | Temp 98.7°F | Resp 20 | Wt 138.9 lb

## 2020-12-26 DIAGNOSIS — Z7189 Other specified counseling: Secondary | ICD-10-CM

## 2020-12-26 DIAGNOSIS — Z5112 Encounter for antineoplastic immunotherapy: Secondary | ICD-10-CM | POA: Diagnosis not present

## 2020-12-26 DIAGNOSIS — Z5111 Encounter for antineoplastic chemotherapy: Secondary | ICD-10-CM

## 2020-12-26 DIAGNOSIS — C9000 Multiple myeloma not having achieved remission: Secondary | ICD-10-CM

## 2020-12-26 DIAGNOSIS — Z95828 Presence of other vascular implants and grafts: Secondary | ICD-10-CM

## 2020-12-26 LAB — CBC WITH DIFFERENTIAL/PLATELET
Abs Immature Granulocytes: 0.24 10*3/uL — ABNORMAL HIGH (ref 0.00–0.07)
Basophils Absolute: 0 10*3/uL (ref 0.0–0.1)
Basophils Relative: 1 %
Eosinophils Absolute: 0.3 10*3/uL (ref 0.0–0.5)
Eosinophils Relative: 5 %
HCT: 34 % — ABNORMAL LOW (ref 36.0–46.0)
Hemoglobin: 11.5 g/dL — ABNORMAL LOW (ref 12.0–15.0)
Immature Granulocytes: 4 %
Lymphocytes Relative: 22 %
Lymphs Abs: 1.2 10*3/uL (ref 0.7–4.0)
MCH: 34.1 pg — ABNORMAL HIGH (ref 26.0–34.0)
MCHC: 33.8 g/dL (ref 30.0–36.0)
MCV: 100.9 fL — ABNORMAL HIGH (ref 80.0–100.0)
Monocytes Absolute: 0.6 10*3/uL (ref 0.1–1.0)
Monocytes Relative: 11 %
Neutro Abs: 3.1 10*3/uL (ref 1.7–7.7)
Neutrophils Relative %: 57 %
Platelets: 97 10*3/uL — ABNORMAL LOW (ref 150–400)
RBC: 3.37 MIL/uL — ABNORMAL LOW (ref 3.87–5.11)
RDW: 14.7 % (ref 11.5–15.5)
WBC: 5.4 10*3/uL (ref 4.0–10.5)
nRBC: 0 % (ref 0.0–0.2)

## 2020-12-26 LAB — CMP (CANCER CENTER ONLY)
ALT: 14 U/L (ref 0–44)
AST: 16 U/L (ref 15–41)
Albumin: 3.8 g/dL (ref 3.5–5.0)
Alkaline Phosphatase: 62 U/L (ref 38–126)
Anion gap: 9 (ref 5–15)
BUN: 24 mg/dL — ABNORMAL HIGH (ref 6–20)
CO2: 23 mmol/L (ref 22–32)
Calcium: 8.9 mg/dL (ref 8.9–10.3)
Chloride: 107 mmol/L (ref 98–111)
Creatinine: 0.9 mg/dL (ref 0.44–1.00)
GFR, Estimated: >60 mL/min (ref >60)
Glucose, Bld: 104 mg/dL — ABNORMAL HIGH (ref 70–99)
Potassium: 4.2 mmol/L (ref 3.5–5.1)
Sodium: 139 mmol/L (ref 135–145)
Total Bilirubin: 0.7 mg/dL (ref 0.3–1.2)
Total Protein: 5.9 g/dL — ABNORMAL LOW (ref 6.5–8.1)

## 2020-12-26 MED ORDER — SODIUM CHLORIDE 0.9 % IV SOLN: Freq: Once | INTRAVENOUS | Status: AC

## 2020-12-26 MED ORDER — SODIUM CHLORIDE 0.9% FLUSH
10.0000 mL | INTRAVENOUS | Status: DC | PRN
  Administered 2020-12-26: 10 mL

## 2020-12-26 MED ORDER — HEPARIN SOD (PORK) LOCK FLUSH 100 UNIT/ML IV SOLN
500.0000 [IU] | Freq: Once | INTRAVENOUS | Status: AC | PRN
  Administered 2020-12-26: 500 [IU]

## 2020-12-26 MED ORDER — DIPHENHYDRAMINE HCL 25 MG PO CAPS
25.0000 mg | ORAL_CAPSULE | Freq: Once | ORAL | Status: AC
  Administered 2020-12-26: 25 mg via ORAL
  Filled 2020-12-26: qty 1

## 2020-12-26 MED ORDER — DEXTROSE 5 % IV SOLN
36.0000 mg/m2 | Freq: Once | INTRAVENOUS | Status: AC
  Administered 2020-12-26: 60 mg via INTRAVENOUS
  Filled 2020-12-26: qty 30

## 2020-12-26 MED ORDER — FAMOTIDINE 20 MG PO TABS
20.0000 mg | ORAL_TABLET | Freq: Once | ORAL | Status: AC
  Administered 2020-12-26: 20 mg via ORAL
  Filled 2020-12-26: qty 1

## 2020-12-26 MED ORDER — SODIUM CHLORIDE 0.9 % IV SOLN
20.0000 mg | Freq: Once | INTRAVENOUS | Status: AC
  Administered 2020-12-26: 20 mg via INTRAVENOUS
  Filled 2020-12-26: qty 20

## 2020-12-26 MED ORDER — SODIUM CHLORIDE 0.9% FLUSH
10.0000 mL | Freq: Once | INTRAVENOUS | Status: AC
  Administered 2020-12-26: 10 mL

## 2020-12-26 MED ORDER — LENALIDOMIDE 10 MG PO CAPS: ORAL_CAPSULE | ORAL | 0 refills | Status: DC

## 2020-12-26 MED ORDER — ACETAMINOPHEN 500 MG PO TABS
1000.0000 mg | ORAL_TABLET | Freq: Once | ORAL | Status: AC
  Administered 2020-12-26: 1000 mg via ORAL
  Filled 2020-12-26: qty 2

## 2020-12-26 MED FILL — Dexamethasone Sodium Phosphate Inj 100 MG/10ML: INTRAMUSCULAR | Qty: 2 | Status: AC

## 2020-12-26 NOTE — Patient Instructions (Signed)
[facility]  Discharge Instructions: Thank you for choosing [facility] to provide your oncology and hematology care.   If you have a lab appointment with the Cancer Center, please go directly to the Cancer Center and check in at the registration area.   Wear comfortable clothing and clothing appropriate for easy access to any Portacath or PICC line.   We strive to give you quality time with your provider. You may need to reschedule your appointment if you arrive late (15 or more minutes).  Arriving late affects you and other patients whose appointments are after yours.  Also, if you miss three or more appointments without notifying the office, you may be dismissed from the clinic at the provider's discretion.      For prescription refill requests, have your pharmacy contact our office and allow 72 hours for refills to be completed.    Today you received the following chemotherapy and/or immunotherapy agents kyprolis      To help prevent nausea and vomiting after your treatment, we encourage you to take your nausea medication as directed.  BELOW ARE SYMPTOMS THAT SHOULD BE REPORTED IMMEDIATELY: . *FEVER GREATER THAN 100.4 F (38 C) OR HIGHER . *CHILLS OR SWEATING . *NAUSEA AND VOMITING THAT IS NOT CONTROLLED WITH YOUR NAUSEA MEDICATION . *UNUSUAL SHORTNESS OF BREATH . *UNUSUAL BRUISING OR BLEEDING . *URINARY PROBLEMS (pain or burning when urinating, or frequent urination) . *BOWEL PROBLEMS (unusual diarrhea, constipation, pain near the anus) . TENDERNESS IN MOUTH AND THROAT WITH OR WITHOUT PRESENCE OF ULCERS (sore throat, sores in mouth, or a toothache) . UNUSUAL RASH, SWELLING OR PAIN  . UNUSUAL VAGINAL DISCHARGE OR ITCHING   Items with * indicate a potential emergency and should be followed up as soon as possible or go to the Emergency Department if any problems should occur.  Please show the CHEMOTHERAPY ALERT CARD or IMMUNOTHERAPY ALERT  CARD at check-in to the Emergency Department and triage nurse.  Should you have questions after your visit or need to cancel or reschedule your appointment, please contact [facility]  Dept: [phone]  and follow the prompts.  Office hours are 8:00 a.m. to 4:30 p.m. Monday - Friday. Please note that voicemails left after 4:00 p.m. may not be returned until the following business day.  We are closed weekends and major holidays. You have access to a nurse at all times for urgent questions. Please call the main number to the clinic Dept: [phone] and follow the prompts.   For any non-urgent questions, you may also contact your provider using MyChart. We now offer e-Visits for anyone 18 and older to request care online for non-urgent symptoms. For details visit [email or web address].   Also download the MyChart app! Go to the app store, search "MyChart", open the app, select [facility], and log in with your MyChart username and password.  Due to Covid, a mask is required upon entering the hospital/clinic. If you do not have a mask, one will be given to you upon arrival. For doctor visits, patients may have 1 support person aged 18 or older with them. For treatment visits, patients cannot have anyone with them due to current Covid guidelines and our immunocompromised population.   

## 2020-12-26 NOTE — Progress Notes (Signed)
Per Dr. Irene Limbo, ok to treat with decreased platelets.

## 2020-12-27 ENCOUNTER — Other Ambulatory Visit: Payer: Self-pay | Admitting: Hematology

## 2020-12-27 ENCOUNTER — Inpatient Hospital Stay: Payer: BC Managed Care – PPO

## 2020-12-27 VITALS — BP 157/74 | HR 82 | Temp 98.4°F | Resp 17

## 2020-12-27 DIAGNOSIS — Z7189 Other specified counseling: Secondary | ICD-10-CM

## 2020-12-27 DIAGNOSIS — Z5112 Encounter for antineoplastic immunotherapy: Secondary | ICD-10-CM | POA: Diagnosis not present

## 2020-12-27 DIAGNOSIS — C9000 Multiple myeloma not having achieved remission: Secondary | ICD-10-CM

## 2020-12-27 MED ORDER — ACETAMINOPHEN 500 MG PO TABS
1000.0000 mg | ORAL_TABLET | Freq: Once | ORAL | Status: AC
  Administered 2020-12-27: 1000 mg via ORAL
  Filled 2020-12-27: qty 2

## 2020-12-27 MED ORDER — DIPHENHYDRAMINE HCL 25 MG PO CAPS
25.0000 mg | ORAL_CAPSULE | Freq: Once | ORAL | Status: AC
  Administered 2020-12-27: 25 mg via ORAL
  Filled 2020-12-27: qty 1

## 2020-12-27 MED ORDER — SODIUM CHLORIDE 0.9% FLUSH
10.0000 mL | INTRAVENOUS | Status: DC | PRN
  Administered 2020-12-27: 10 mL

## 2020-12-27 MED ORDER — SODIUM CHLORIDE 0.9 % IV SOLN: Freq: Once | INTRAVENOUS | Status: DC

## 2020-12-27 MED ORDER — DEXTROSE 5 % IV SOLN
36.0000 mg/m2 | Freq: Once | INTRAVENOUS | Status: AC
  Administered 2020-12-27: 60 mg via INTRAVENOUS
  Filled 2020-12-27: qty 30

## 2020-12-27 MED ORDER — SODIUM CHLORIDE 0.9 % IV SOLN
20.0000 mg | Freq: Once | INTRAVENOUS | Status: AC
  Administered 2020-12-27: 20 mg via INTRAVENOUS
  Filled 2020-12-27: qty 20

## 2020-12-27 MED ORDER — SODIUM CHLORIDE 0.9 % IV SOLN: Freq: Once | INTRAVENOUS | Status: AC

## 2020-12-27 MED ORDER — HEPARIN SOD (PORK) LOCK FLUSH 100 UNIT/ML IV SOLN
500.0000 [IU] | Freq: Once | INTRAVENOUS | Status: AC | PRN
  Administered 2020-12-27: 500 [IU]

## 2020-12-27 MED ORDER — FAMOTIDINE 20 MG PO TABS
20.0000 mg | ORAL_TABLET | Freq: Once | ORAL | Status: AC
  Administered 2020-12-27: 20 mg via ORAL
  Filled 2020-12-27: qty 1

## 2021-01-01 MED FILL — Dexamethasone Sodium Phosphate Inj 100 MG/10ML: INTRAMUSCULAR | Qty: 2 | Status: AC

## 2021-01-01 NOTE — Progress Notes (Signed)
HEMATOLOGY/ONCOLOGY CLINIC NOTE  Date of Service: 01/01/2021  Patient Care Team: [name], MD as PCP - General (Family Medicine) [name], [name], MD as PCP - Cardiology (Cardiology) [name] [name], MD as Consulting Physician (Gastroenterology)  CHIEF COMPLAINTS/PURPOSE OF CONSULTATION:  Multiple Myeloma  HISTORY OF PRESENTING ILLNESS:   Megan Velasquez is a wonderful 60 y.o. female who has been referred to [place] for evaluation and management of multiple myeloma. Pt is accompanied today by her husband for this visit.    Patient was referred to [place] for second opinion regarding management of newly diagnosed multiple myeloma by Dr. Laurena Slimmer MD.  Patient had been referred to to [facility] by her primary care physician for anemia and elevated serum protein levels.  She also had some increasing fatigue and tingling in her hands and feet.  Noted primarily in the right wrist and right ankle.  B12 levels were noted to be borderline low.  She had a work-up including labs which showed hemoglobin of 9.7 with an MCV of 86.9 WBC count of 6.95k and platelets of 165k. CMP showed a creatinine of 1.06 calcium level of 8.9 total protein of 10.2 with an albumin of 2.7 normal liver function tests and other electrolytes. Serum protein electrophoresis with an M spike of 4.3 with an additional M spike of 0.2 g/dL.  IgA quantitative more than 6400. B12 levels 276, homocystine 15.9 Serum folate 26.5 LDH 96 Beta-2 microglobulin of 2.5  Serum kappa lambda free light chains showed free kappa light chains of 181, free lambda light chains of 3.6 and a free kappa lambda ratio of 50.3.  24-hour UPEP showed no M spike and a total protein of 5.7 mg/dL.  Bone Survey completed on 03/08/2020 with results revealing "No evidence of lytic nor blastic lesions within the appendicular or axial skeleton. Degenerative changes as described above."   On review of systems, pt reports some mild fatigue.  No focal  bone pains.  No chest pain.  No shortness of breath. No evidence of GI bleeding no hematuria no nosebleeds or gum bleeds. No other acute new focal symptoms. Denies any history of known cardiopulmonary disease strokes or heart attacks.  INTERVAL HISTORY  Megan Velasquez is a wonderful 60 y.o. female who is here today for evaluation and management of multiple myeloma. The patient's last visit with [place] was on 12/03/2020. The pt reports that she is doing well overall.   She is here for follow-up prior to her continued management of myeloma with [facility].  Patient reports that she has had a detailed discussion with her physicians at [facility] and has spent a significant period time personally thinking about and discussing with her family and has decided to not pursue autologous bone marrow transplant.  She notes she will discuss with the transplant team if she decides to have her bone marrow stem cells mobilized and saved for consideration of bone marrow transplantation in the future.  Her last myeloma panel at [facility] on 12/03/2020 showed unmeasurable M spike with possible positive IFE Mild intermittent hand cramping from Revlimid.  Still disinclined to consider Covid 19 vaccine and Evusheld.  Lab results today were reviewed with the patient  On review of systems, pt reports no infection issues or other acute issues.   MEDICAL HISTORY:  Past Medical History:  Diagnosis Date   Cancer ([place])    GERD (gastroesophageal reflux disease)    History of kidney stones    HTN (hypertension)  Renal disorder     SURGICAL HISTORY: Past Surgical History:  Procedure Laterality Date   CHOLECYSTECTOMY     COLONOSCOPY  01/2015   Dr. Britta Mccreedy: Mild diverticulosis, sessile polyp ranging 3 to 5 mm removed from the proximal transverse colon, semi-pedunculated polyp 5 to 9 mm in size removed from the sigmoid colon.  Sigmoid colon polyp was serrated adenoma, transverse colon polyp was adenomatous.   Patient was told to have another colonoscopy in 3 years.   COLONOSCOPY WITH PROPOFOL N/A 10/11/2017   Procedure: COLONOSCOPY WITH PROPOFOL;  Surgeon: Daneil Dolin, MD;  [facility]: [facility];  Service: Endoscopy;  Laterality: N/A;  1:15pm   IR IMAGING GUIDED PORT INSERTION  06/05/2020   POLYPECTOMY  10/11/2017   Procedure: POLYPECTOMY;  Surgeon: Daneil Dolin, MD;  Location: [facility];  Service: Endoscopy;;  descending colon polyps cs times 2    SOCIAL HISTORY: Social History   Socioeconomic History   Marital status: Married    Spouse name: Not on file   Number of children: Not on file   Years of education: Not on file   Highest education level: Not on file  Occupational History   Not on file  Tobacco Use   Smoking status: Former    Packs/day: 1.00    Years: 28.00    Pack years: 28.00    Types: Cigarettes    Quit date: 10/08/2013    Years since quitting: 7.2   Smokeless tobacco: Never  Vaping Use   Vaping Use: Never used  Substance and Sexual Activity   Alcohol use: Not Currently   Drug use: Never   Sexual activity: Yes    Birth control/protection: Post-menopausal  Other Topics Concern   Not on file  Social History Narrative   Not on file   Social Determinants of Health   Financial Resource Strain: Low Risk    Difficulty of Paying Living Expenses: Not hard at all  Food Insecurity: No Food Insecurity   Worried About Charity fundraiser in the Last Year: Never true   [place] in the Last Year: Never true  Transportation Needs: No Transportation Needs   Lack of Transportation (Medical): No   Lack of Transportation (Non-Medical): No  Physical Activity: Not on file  Stress: No Stress Concern Present   Feeling of Stress : Only a little  Social Connections: Unknown   Frequency of Communication with Friends and Family: More than three times a week   Frequency of Social Gatherings with Friends and Family: More than three times a week   Attends Religious  Services: Not on Electrical engineer or Organizations: Not on file   Attends Archivist Meetings: Not on file   Marital Status: Married  Human resources officer Violence: Not on file    FAMILY HISTORY: Family History  Problem Relation Age of Onset   Heart disease Mother    Diabetes Mother    Lung cancer Father    COPD Father    Colon cancer Neg Hx     ALLERGIES:  has No Known Allergies.  MEDICATIONS:  Current Outpatient Medications  Medication Sig Dispense Refill   acetaminophen (TYLENOL) 500 MG tablet Take 1,000 mg by mouth every 6 (six) hours as needed for fever or headache.     acyclovir (ZOVIRAX) 400 MG tablet TAKE 1 TABLET BY MOUTH TWICE A DAY 30 tablet 2   Ascorbic Acid (VITAMIN C) 1000 MG tablet Take 1,000 mg  by mouth every morning.     b complex vitamins capsule Take 1 capsule by mouth daily. (Patient taking differently: Take 1 capsule by mouth every morning.) 30 capsule 3   chlorhexidine (PERIDEX) 0.12 % solution SMARTSIG:By Mouth     Cholecalciferol (VITAMIN D3) 5000 units CAPS Take 5,000 Units by mouth every morning.     Cyanocobalamin (VITAMIN B12) 3000 MCG SUBL Place 3,000 mcg under the tongue every morning.     dexamethasone (DECADRON) 4 MG tablet TAKE 5 TABLETS BY MOUTH ONCE A WEEK. 40 tablet 2   ELIQUIS 2.5 MG TABS tablet TAKE 1 TABLET BY MOUTH TWICE A DAY 60 tablet 5   lenalidomide (REVLIMID) 10 MG capsule Take 1 capsule by mouth daily at bedtime for 21 days on ans 7 days off 21 capsule 0   lidocaine-prilocaine (EMLA) cream Apply to affected area once (Patient taking differently: Apply 1 application topically once as needed (prior to port access).) 30 g 3   loratadine (CLARITIN) 10 MG tablet Take 10 mg by mouth every morning.     LORazepam (ATIVAN) 0.5 MG tablet Take 1 tablet (0.5 mg total) by mouth every 6 (six) hours as needed (Nausea or vomiting). (Patient not taking: Reported on 07/04/2020) 30 tablet 0   omeprazole (PRILOSEC) 40 MG capsule Take 40  mg by mouth daily.     ondansetron (ZOFRAN) 8 MG tablet Take 1 tablet (8 mg total) by mouth 2 (two) times daily as needed (Nausea or vomiting). 30 tablet 1   prochlorperazine (COMPAZINE) 10 MG tablet Take 1 tablet (10 mg total) by mouth every 6 (six) hours as needed (Nausea or vomiting). 30 tablet 1   REVLIMID 15 MG capsule TAKE 1 CAPSULE BY MOUTH ONCE DAILY AT BEDTIME FOR 21 DAYS ON AND 7 DAYS OFF 21 capsule 0   No current facility-administered medications for this visit.    REVIEW OF SYSTEMS:   .10 Point review of Systems was done is negative except as noted above.    PHYSICAL EXAMINATION: ECOG PERFORMANCE STATUS: 1 - Symptomatic but completely ambulatory  NAD .Marland Kitchen GENERAL:alert, in no acute distress and comfortable SKIN: no acute rashes, no significant lesions EYES: conjunctiva are pink and non-injected, sclera anicteric OROPHARYNX: MMM, no exudates, no oropharyngeal erythema or ulceration NECK: supple, no JVD LYMPH:  no palpable lymphadenopathy in the cervical, axillary or inguinal regions LUNGS: clear to auscultation b/l with normal respiratory effort HEART: regular rate & rhythm ABDOMEN:  normoactive bowel sounds , non tender, not distended. Extremity: no pedal edema PSYCH: alert & oriented x 3 with fluent speech NEURO: no focal motor/sensory deficits    LABORATORY DATA:  I have reviewed the data as listed  . CBC Latest Ref Rng & Units 12/26/2020 12/19/2020 12/05/2020  WBC 4.0 - 10.5 K/uL 5.4 7.1 8.2  Hemoglobin 12.0 - 15.0 g/dL 11.5(L) 11.7(L) 11.7(L)  Hematocrit 36.0 - 46.0 % 34.0(L) 35.3(L) 33.7(L)  Platelets 150 - 400 K/uL 97(L) 210 149(L)    . CMP Latest Ref Rng & Units 12/26/2020 12/19/2020 12/05/2020  Glucose 70 - 99 mg/dL 104(H) 131(H) 102(H)  BUN 6 - 20 mg/dL 24(H) 23(H) 20  Creatinine 0.44 - 1.00 mg/dL 0.90 0.88 1.10(H)  Sodium 135 - 145 mmol/L 139 139 140  Potassium 3.5 - 5.1 mmol/L 4.2 3.6 4.3  Chloride 98 - 111 mmol/L 107 105 105  CO2 22 - 32 mmol/L 23 22  24   Calcium 8.9 - 10.3 mg/dL 8.9 8.9 8.9  Total Protein 6.5 - 8.1 g/dL  5.9(L) 6.0(L) 5.8(L)  Total Bilirubin 0.3 - 1.2 mg/dL 0.7 0.7 0.7  Alkaline Phos 38 - 126 U/L 62 65 57  AST 15 - 41 U/L 16 14(L) 15  ALT 0 - 44 U/L 14 16 17      Most recent lab results (03/15/2020) of CBC is as follows: all values are WNL except for RBC at 3.66, Hgb at 9.7, HCT at 31.8, MCHC at 30.5, RDW Standard Deviation 58.7, Red Cell Distribution Width At 18.6,  BUN at 19, Creatinine at 1.06, Total Protein at 10.2, Albumin at 2.7, AST at 14,. 03/13/2020 UPEP shows no M-Spike, all values are WNL 03/06/2020 SPEP shows Total Protein at 10.1, Beta Globulin at 5.2, M-Spike at 4.3, Total Globulin at 6.4, A/G Ratio at 0.6 03/06/2020 Free Kappa Light Chains at 181.1, Free Lambda Light Chains Serum at 3.6, K/L ratio at 50.31 03/06/2020 Beta-2 Microglobulin at 2.5           RADIOGRAPHIC STUDIES: I have personally reviewed the radiological images as listed and agreed with the findings in the report. No results found.  ASSESSMENT & PLAN:   60 year old female with  #1 Multiple myeloma -IgA kappa.  Presenting with anemia hemoglobin 9.4. Creatinine 1.28 No hypercalcemia Bone survey with no obvious skeletal lesions. Baseline M spike of 4.3 g/dL. Beta-2 Microglobulin at 2.5 Mol Cy Monosomy 13 and dup 1q   PLAN: -Discussed pt labwork today, 12/26/2020; CBC and CMP stable -Recent myeloma panel on 12/03/2020 at [facility] showed no measurable M spike but positive IFE -She notes that after talking to the transplant team at [facility] she has decided not to pursue consolidation with an autologous bone marrow transplant --Continue masking and aggressive COVID prevention strategies. -Will keep Carfilzomib dose at 36 mg/m^2.  To complete this cycle of treatment. -Patient will transition to maintenance Revlimid at 10 mg 3 weeks on 1 week off after completing this last cycle of induction treatment. -Continue 2.5  mg Eliquis BID. -Will need to set up patient for maintenance bisphosphonates every 2 months will discuss and set up after next clinic visit.  FOLLOW UP: RTC with Dr Irene Limbo with labs in 6 weeks  . The total time spent in the appointment was 30 minutes and more than 50% was on counseling and direct patient cares.    Sullivan Lone MD [place] AAHIVMS [facility] Hematology/Oncology Physician [facility]

## 2021-01-02 ENCOUNTER — Other Ambulatory Visit: Payer: Self-pay

## 2021-01-02 ENCOUNTER — Inpatient Hospital Stay: Payer: BC Managed Care – PPO

## 2021-01-02 ENCOUNTER — Encounter: Payer: Self-pay | Admitting: Hematology

## 2021-01-02 ENCOUNTER — Other Ambulatory Visit (HOSPITAL_COMMUNITY): Payer: Self-pay

## 2021-01-02 VITALS — BP 144/73 | HR 81 | Temp 98.5°F | Resp 17 | Wt 137.0 lb

## 2021-01-02 DIAGNOSIS — Z7189 Other specified counseling: Secondary | ICD-10-CM

## 2021-01-02 DIAGNOSIS — C9000 Multiple myeloma not having achieved remission: Secondary | ICD-10-CM

## 2021-01-02 DIAGNOSIS — Z95828 Presence of other vascular implants and grafts: Secondary | ICD-10-CM

## 2021-01-02 DIAGNOSIS — Z5112 Encounter for antineoplastic immunotherapy: Secondary | ICD-10-CM | POA: Diagnosis not present

## 2021-01-02 LAB — CMP (CANCER CENTER ONLY)
ALT: 19 U/L (ref 0–44)
AST: 13 U/L — ABNORMAL LOW (ref 15–41)
Albumin: 3.8 g/dL (ref 3.5–5.0)
Alkaline Phosphatase: 54 U/L (ref 38–126)
Anion gap: 13 (ref 5–15)
BUN: 20 mg/dL (ref 6–20)
CO2: 21 mmol/L — ABNORMAL LOW (ref 22–32)
Calcium: 8.7 mg/dL — ABNORMAL LOW (ref 8.9–10.3)
Chloride: 105 mmol/L (ref 98–111)
Creatinine: 1.03 mg/dL — ABNORMAL HIGH (ref 0.44–1.00)
GFR, Estimated: >60 mL/min (ref >60)
Glucose, Bld: 109 mg/dL — ABNORMAL HIGH (ref 70–99)
Potassium: 4.2 mmol/L (ref 3.5–5.1)
Sodium: 139 mmol/L (ref 135–145)
Total Bilirubin: 0.9 mg/dL (ref 0.3–1.2)
Total Protein: 5.8 g/dL — ABNORMAL LOW (ref 6.5–8.1)

## 2021-01-02 LAB — CBC WITH DIFFERENTIAL/PLATELET
Abs Immature Granulocytes: 0.2 10*3/uL — ABNORMAL HIGH (ref 0.00–0.07)
Basophils Absolute: 0.1 10*3/uL (ref 0.0–0.1)
Basophils Relative: 1 %
Eosinophils Absolute: 0.6 10*3/uL — ABNORMAL HIGH (ref 0.0–0.5)
Eosinophils Relative: 7 %
HCT: 35.2 % — ABNORMAL LOW (ref 36.0–46.0)
Hemoglobin: 11.9 g/dL — ABNORMAL LOW (ref 12.0–15.0)
Immature Granulocytes: 2 %
Lymphocytes Relative: 15 %
Lymphs Abs: 1.3 10*3/uL (ref 0.7–4.0)
MCH: 33.6 pg (ref 26.0–34.0)
MCHC: 33.8 g/dL (ref 30.0–36.0)
MCV: 99.4 fL (ref 80.0–100.0)
Monocytes Absolute: 1.2 10*3/uL — ABNORMAL HIGH (ref 0.1–1.0)
Monocytes Relative: 13 %
Neutro Abs: 5.7 10*3/uL (ref 1.7–7.7)
Neutrophils Relative %: 62 %
Platelets: 132 10*3/uL — ABNORMAL LOW (ref 150–400)
RBC: 3.54 MIL/uL — ABNORMAL LOW (ref 3.87–5.11)
RDW: 14.5 % (ref 11.5–15.5)
WBC: 9 10*3/uL (ref 4.0–10.5)
nRBC: 0 % (ref 0.0–0.2)

## 2021-01-02 MED ORDER — ACETAMINOPHEN 500 MG PO TABS
1000.0000 mg | ORAL_TABLET | Freq: Once | ORAL | Status: AC
  Administered 2021-01-02: 1000 mg via ORAL
  Filled 2021-01-02: qty 2

## 2021-01-02 MED ORDER — SODIUM CHLORIDE 0.9% FLUSH
10.0000 mL | INTRAVENOUS | Status: DC | PRN
Start: 2021-01-02 — End: 2021-01-02
  Administered 2021-01-02: 10 mL

## 2021-01-02 MED ORDER — DEXTROSE 5 % IV SOLN
36.0000 mg/m2 | Freq: Once | INTRAVENOUS | Status: AC
  Administered 2021-01-02: 60 mg via INTRAVENOUS
  Filled 2021-01-02: qty 30

## 2021-01-02 MED ORDER — SODIUM CHLORIDE 0.9 % IV SOLN: Freq: Once | INTRAVENOUS | Status: AC

## 2021-01-02 MED ORDER — SODIUM CHLORIDE 0.9% FLUSH
10.0000 mL | Freq: Once | INTRAVENOUS | Status: AC
  Administered 2021-01-02: 10 mL

## 2021-01-02 MED ORDER — DIPHENHYDRAMINE HCL 25 MG PO CAPS
25.0000 mg | ORAL_CAPSULE | Freq: Once | ORAL | Status: AC
  Administered 2021-01-02: 25 mg via ORAL
  Filled 2021-01-02: qty 1

## 2021-01-02 MED ORDER — FAMOTIDINE 20 MG PO TABS
20.0000 mg | ORAL_TABLET | Freq: Once | ORAL | Status: AC
  Administered 2021-01-02: 20 mg via ORAL
  Filled 2021-01-02: qty 1

## 2021-01-02 MED ORDER — HEPARIN SOD (PORK) LOCK FLUSH 100 UNIT/ML IV SOLN
500.0000 [IU] | Freq: Once | INTRAVENOUS | Status: AC | PRN
  Administered 2021-01-02: 500 [IU]

## 2021-01-02 MED ORDER — SODIUM CHLORIDE 0.9 % IV SOLN
20.0000 mg | Freq: Once | INTRAVENOUS | Status: AC
  Administered 2021-01-02: 20 mg via INTRAVENOUS
  Filled 2021-01-02: qty 20

## 2021-01-02 MED FILL — Dexamethasone Sodium Phosphate Inj 100 MG/10ML: INTRAMUSCULAR | Qty: 2 | Status: AC

## 2021-01-02 NOTE — Patient Instructions (Signed)
[facility]  Discharge Instructions: Thank you for choosing [facility] to provide your oncology and hematology care.   If you have a lab appointment with the Cancer Center, please go directly to the Cancer Center and check in at the registration area.   Wear comfortable clothing and clothing appropriate for easy access to any Portacath or PICC line.   We strive to give you quality time with your provider. You may need to reschedule your appointment if you arrive late (15 or more minutes).  Arriving late affects you and other patients whose appointments are after yours.  Also, if you miss three or more appointments without notifying the office, you may be dismissed from the clinic at the provider's discretion.      For prescription refill requests, have your pharmacy contact our office and allow 72 hours for refills to be completed.    Today you received the following chemotherapy and/or immunotherapy agents: Kyprolis    To help prevent nausea and vomiting after your treatment, we encourage you to take your nausea medication as directed.  BELOW ARE SYMPTOMS THAT SHOULD BE REPORTED IMMEDIATELY: . *FEVER GREATER THAN 100.4 F (38 C) OR HIGHER . *CHILLS OR SWEATING . *NAUSEA AND VOMITING THAT IS NOT CONTROLLED WITH YOUR NAUSEA MEDICATION . *UNUSUAL SHORTNESS OF BREATH . *UNUSUAL BRUISING OR BLEEDING . *URINARY PROBLEMS (pain or burning when urinating, or frequent urination) . *BOWEL PROBLEMS (unusual diarrhea, constipation, pain near the anus) . TENDERNESS IN MOUTH AND THROAT WITH OR WITHOUT PRESENCE OF ULCERS (sore throat, sores in mouth, or a toothache) . UNUSUAL RASH, SWELLING OR PAIN  . UNUSUAL VAGINAL DISCHARGE OR ITCHING   Items with * indicate a potential emergency and should be followed up as soon as possible or go to the Emergency Department if any problems should occur.  Please show the CHEMOTHERAPY ALERT CARD or IMMUNOTHERAPY ALERT  CARD at check-in to the Emergency Department and triage nurse.  Should you have questions after your visit or need to cancel or reschedule your appointment, please contact [facility]  Dept: [phone]  and follow the prompts.  Office hours are 8:00 a.m. to 4:30 p.m. Monday - Friday. Please note that voicemails left after 4:00 p.m. may not be returned until the following business day.  We are closed weekends and major holidays. You have access to a nurse at all times for urgent questions. Please call the main number to the clinic Dept: [phone] and follow the prompts.   For any non-urgent questions, you may also contact your provider using MyChart. We now offer e-Visits for anyone 18 and older to request care online for non-urgent symptoms. For details visit [email or web address].   Also download the MyChart app! Go to the app store, search "MyChart", open the app, select [facility], and log in with your MyChart username and password.  Due to Covid, a mask is required upon entering the hospital/clinic. If you do not have a mask, one will be given to you upon arrival. For doctor visits, patients may have 1 support person aged 18 or older with them. For treatment visits, patients cannot have anyone with them due to current Covid guidelines and our immunocompromised population.   

## 2021-01-03 ENCOUNTER — Inpatient Hospital Stay: Payer: BC Managed Care – PPO

## 2021-01-03 ENCOUNTER — Other Ambulatory Visit (HOSPITAL_COMMUNITY): Payer: Self-pay

## 2021-01-03 VITALS — BP 151/70 | HR 81 | Temp 97.9°F | Resp 18

## 2021-01-03 DIAGNOSIS — Z7189 Other specified counseling: Secondary | ICD-10-CM

## 2021-01-03 DIAGNOSIS — Z5112 Encounter for antineoplastic immunotherapy: Secondary | ICD-10-CM | POA: Diagnosis not present

## 2021-01-03 DIAGNOSIS — C9000 Multiple myeloma not having achieved remission: Secondary | ICD-10-CM

## 2021-01-03 MED ORDER — FAMOTIDINE 20 MG PO TABS
20.0000 mg | ORAL_TABLET | Freq: Once | ORAL | Status: AC
  Administered 2021-01-03: 20 mg via ORAL
  Filled 2021-01-03: qty 1

## 2021-01-03 MED ORDER — HEPARIN SOD (PORK) LOCK FLUSH 100 UNIT/ML IV SOLN
500.0000 [IU] | Freq: Once | INTRAVENOUS | Status: AC | PRN
  Administered 2021-01-03: 500 [IU]

## 2021-01-03 MED ORDER — ACETAMINOPHEN 500 MG PO TABS
1000.0000 mg | ORAL_TABLET | Freq: Once | ORAL | Status: AC
  Administered 2021-01-03: 1000 mg via ORAL
  Filled 2021-01-03: qty 2

## 2021-01-03 MED ORDER — SODIUM CHLORIDE 0.9 % IV SOLN: Freq: Once | INTRAVENOUS | Status: DC

## 2021-01-03 MED ORDER — DEXTROSE 5 % IV SOLN
36.0000 mg/m2 | Freq: Once | INTRAVENOUS | Status: AC
  Administered 2021-01-03: 60 mg via INTRAVENOUS
  Filled 2021-01-03: qty 30

## 2021-01-03 MED ORDER — SODIUM CHLORIDE 0.9% FLUSH
10.0000 mL | INTRAVENOUS | Status: DC | PRN
  Administered 2021-01-03: 10 mL

## 2021-01-03 MED ORDER — SODIUM CHLORIDE 0.9 % IV SOLN: Freq: Once | INTRAVENOUS | Status: AC

## 2021-01-03 MED ORDER — DIPHENHYDRAMINE HCL 25 MG PO CAPS
25.0000 mg | ORAL_CAPSULE | Freq: Once | ORAL | Status: AC
  Administered 2021-01-03: 25 mg via ORAL
  Filled 2021-01-03: qty 1

## 2021-01-03 MED ORDER — SODIUM CHLORIDE 0.9 % IV SOLN
20.0000 mg | Freq: Once | INTRAVENOUS | Status: AC
  Administered 2021-01-03: 20 mg via INTRAVENOUS
  Filled 2021-01-03: qty 20

## 2021-01-03 NOTE — Patient Instructions (Signed)
[facility] MEDICAL [facility]COLOGY  Discharge Instructions: Thank you for choosing [facility] to provide your oncology and hematology care.   If you have a lab appointment with the Cancer Center, please go directly to the Cancer Center and check in at the registration area.   Wear comfortable clothing and clothing appropriate for easy access to any Portacath or PICC line.   We strive to give you quality time with your provider. You may need to reschedule your appointment if you arrive late (15 or more minutes).  Arriving late affects you and other patients whose appointments are after yours.  Also, if you miss three or more appointments without notifying the office, you may be dismissed from the clinic at the provider's discretion.      For prescription refill requests, have your pharmacy contact our office and allow 72 hours for refills to be completed.    Today you received the following chemotherapy and/or immunotherapy agents kyprolis      To help prevent nausea and vomiting after your treatment, we encourage you to take your nausea medication as directed.  BELOW ARE SYMPTOMS THAT SHOULD BE REPORTED IMMEDIATELY: . *FEVER GREATER THAN 100.4 F (38 C) OR HIGHER . *CHILLS OR SWEATING . *NAUSEA AND VOMITING THAT IS NOT CONTROLLED WITH YOUR NAUSEA MEDICATION . *UNUSUAL SHORTNESS OF BREATH . *UNUSUAL BRUISING OR BLEEDING . *URINARY PROBLEMS (pain or burning when urinating, or frequent urination) . *BOWEL PROBLEMS (unusual diarrhea, constipation, pain near the anus) . TENDERNESS IN MOUTH AND THROAT WITH OR WITHOUT PRESENCE OF ULCERS (sore throat, sores in mouth, or a toothache) . UNUSUAL RASH, SWELLING OR PAIN  . UNUSUAL VAGINAL DISCHARGE OR ITCHING   Items with * indicate a potential emergency and should be followed up as soon as possible or go to the Emergency Department if any problems should occur.  Please show the CHEMOTHERAPY ALERT CARD or IMMUNOTHERAPY ALERT  CARD at check-in to the Emergency Department and triage nurse.  Should you have questions after your visit or need to cancel or reschedule your appointment, please contact [facility]  Dept: [phone]  and follow the prompts.  Office hours are 8:00 a.m. to 4:30 p.m. Monday - Friday. Please note that voicemails left after 4:00 p.m. may not be returned until the following business day.  We are closed weekends and major holidays. You have access to a nurse at all times for urgent questions. Please call the main number to the clinic Dept: [phone] and follow the prompts.   For any non-urgent questions, you may also contact your provider using MyChart. We now offer e-Visits for anyone 18 and older to request care online for non-urgent symptoms. For details visit [email or web address].   Also download the MyChart app! Go to the app store, search "MyChart", open the app, select , and log in with your MyChart username and password.  Due to Covid, a mask is required upon entering the hospital/clinic. If you do not have a mask, one will be given to you upon arrival. For doctor visits, patients may have 1 support person aged 18 or older with them. For treatment visits, patients cannot have anyone with them due to current Covid guidelines and our immunocompromised population.   

## 2021-01-06 ENCOUNTER — Telehealth: Payer: Self-pay

## 2021-01-06 NOTE — Telephone Encounter (Signed)
Oral Oncology [place] Advocate Encounter   Received notification from [employer] that prior authorization for Lenalidomide is required.   PA submitted by fax [phone] [place] [id number] Status is pending   Oral Oncology Clinic will continue to follow.  Megan Velasquez [place] Phone [phone] Fax [phone] 01/06/2021 3:32 PM

## 2021-01-06 NOTE — Telephone Encounter (Signed)
Oral Oncology Patient Advocate Encounter  Prior Authorization for Lenalidomide has been approved.    PA# [id number] Effective dates: 01/06/21 through 07/07/21   Oral Oncology Clinic will continue to follow.   [place] Patient [place] Phone [phone] Fax [phone] 01/06/2021 3:35 PM

## 2021-01-08 ENCOUNTER — Other Ambulatory Visit: Payer: Self-pay

## 2021-01-08 DIAGNOSIS — C9000 Multiple myeloma not having achieved remission: Secondary | ICD-10-CM

## 2021-01-08 MED ORDER — REVLIMID 10 MG PO CAPS
10.0000 mg | ORAL_CAPSULE | Freq: Every day | ORAL | 0 refills | Status: DC
Start: 2021-01-08 — End: 2021-02-03

## 2021-01-10 ENCOUNTER — Other Ambulatory Visit: Payer: Self-pay | Admitting: Hematology

## 2021-01-10 DIAGNOSIS — Z7189 Other specified counseling: Secondary | ICD-10-CM

## 2021-01-10 DIAGNOSIS — C9000 Multiple myeloma not having achieved remission: Secondary | ICD-10-CM

## 2021-01-21 ENCOUNTER — Other Ambulatory Visit: Payer: Self-pay | Admitting: Hematology

## 2021-01-21 DIAGNOSIS — Z7189 Other specified counseling: Secondary | ICD-10-CM

## 2021-01-21 DIAGNOSIS — C9000 Multiple myeloma not having achieved remission: Secondary | ICD-10-CM

## 2021-02-03 ENCOUNTER — Other Ambulatory Visit: Payer: Self-pay

## 2021-02-03 DIAGNOSIS — C9000 Multiple myeloma not having achieved remission: Secondary | ICD-10-CM

## 2021-02-03 MED ORDER — REVLIMID 10 MG PO CAPS: 10.0000 mg | ORAL_CAPSULE | Freq: Every day | ORAL | 0 refills | Status: DC

## 2021-02-04 ENCOUNTER — Other Ambulatory Visit: Payer: Self-pay | Admitting: Hematology

## 2021-02-04 DIAGNOSIS — Z7189 Other specified counseling: Secondary | ICD-10-CM

## 2021-02-04 DIAGNOSIS — C9000 Multiple myeloma not having achieved remission: Secondary | ICD-10-CM

## 2021-02-15 ENCOUNTER — Other Ambulatory Visit: Payer: Self-pay | Admitting: Hematology

## 2021-02-15 DIAGNOSIS — C9000 Multiple myeloma not having achieved remission: Secondary | ICD-10-CM

## 2021-02-15 DIAGNOSIS — Z7189 Other specified counseling: Secondary | ICD-10-CM

## 2021-02-17 ENCOUNTER — Encounter: Payer: Self-pay | Admitting: Hematology

## 2021-02-28 ENCOUNTER — Other Ambulatory Visit: Payer: Self-pay | Admitting: Hematology

## 2021-02-28 DIAGNOSIS — C9000 Multiple myeloma not having achieved remission: Secondary | ICD-10-CM

## 2021-02-28 DIAGNOSIS — Z7189 Other specified counseling: Secondary | ICD-10-CM

## 2021-03-05 DIAGNOSIS — Z52011 Autologous donor, stem cells: Secondary | ICD-10-CM | POA: Insufficient documentation

## 2021-03-11 ENCOUNTER — Other Ambulatory Visit: Payer: Self-pay

## 2021-03-11 ENCOUNTER — Inpatient Hospital Stay: Payer: BC Managed Care – PPO

## 2021-03-11 ENCOUNTER — Inpatient Hospital Stay: Payer: BC Managed Care – PPO | Attending: Hematology | Admitting: Hematology

## 2021-03-11 VITALS — BP 130/70 | HR 89 | Temp 97.8°F | Resp 16 | Ht 65.0 in | Wt 135.6 lb

## 2021-03-11 DIAGNOSIS — C9000 Multiple myeloma not having achieved remission: Secondary | ICD-10-CM | POA: Insufficient documentation

## 2021-03-11 DIAGNOSIS — Z95828 Presence of other vascular implants and grafts: Secondary | ICD-10-CM

## 2021-03-11 DIAGNOSIS — Z7189 Other specified counseling: Secondary | ICD-10-CM

## 2021-03-11 DIAGNOSIS — Z5111 Encounter for antineoplastic chemotherapy: Secondary | ICD-10-CM

## 2021-03-11 LAB — CMP (CANCER CENTER ONLY)
ALT: 9 U/L (ref 0–44)
AST: 13 U/L — ABNORMAL LOW (ref 15–41)
Albumin: 3.6 g/dL (ref 3.5–5.0)
Alkaline Phosphatase: 135 U/L — ABNORMAL HIGH (ref 38–126)
Anion gap: 12 (ref 5–15)
BUN: 9 mg/dL (ref 6–20)
CO2: 31 mmol/L (ref 22–32)
Calcium: 8.5 mg/dL — ABNORMAL LOW (ref 8.9–10.3)
Chloride: 99 mmol/L (ref 98–111)
Creatinine: 0.87 mg/dL (ref 0.44–1.00)
GFR, Estimated: >60 mL/min (ref >60)
Glucose, Bld: 138 mg/dL — ABNORMAL HIGH (ref 70–99)
Potassium: 3 mmol/L — ABNORMAL LOW (ref 3.5–5.1)
Sodium: 142 mmol/L (ref 135–145)
Total Bilirubin: 0.4 mg/dL (ref 0.3–1.2)
Total Protein: 5.6 g/dL — ABNORMAL LOW (ref 6.5–8.1)

## 2021-03-11 LAB — CBC WITH DIFFERENTIAL/PLATELET
Abs Immature Granulocytes: 1.47 10*3/uL — ABNORMAL HIGH (ref 0.00–0.07)
Basophils Absolute: 0 10*3/uL (ref 0.0–0.1)
Basophils Relative: 0 %
Eosinophils Absolute: 0.3 10*3/uL (ref 0.0–0.5)
Eosinophils Relative: 1 %
HCT: 35.6 % — ABNORMAL LOW (ref 36.0–46.0)
Hemoglobin: 12.1 g/dL (ref 12.0–15.0)
Immature Granulocytes: 5 %
Lymphocytes Relative: 3 %
Lymphs Abs: 0.8 10*3/uL (ref 0.7–4.0)
MCH: 32.5 pg (ref 26.0–34.0)
MCHC: 34 g/dL (ref 30.0–36.0)
MCV: 95.7 fL (ref 80.0–100.0)
Monocytes Absolute: 1.9 10*3/uL — ABNORMAL HIGH (ref 0.1–1.0)
Monocytes Relative: 7 %
Neutro Abs: 24.1 10*3/uL — ABNORMAL HIGH (ref 1.7–7.7)
Neutrophils Relative %: 84 %
Platelets: 41 10*3/uL — ABNORMAL LOW (ref 150–400)
RBC: 3.72 MIL/uL — ABNORMAL LOW (ref 3.87–5.11)
RDW: 13.2 % (ref 11.5–15.5)
Smear Review: NORMAL
WBC: 28.5 10*3/uL — ABNORMAL HIGH (ref 4.0–10.5)
nRBC: 0 % (ref 0.0–0.2)

## 2021-03-11 MED ORDER — HEPARIN SOD (PORK) LOCK FLUSH 100 UNIT/ML IV SOLN
500.0000 [IU] | Freq: Once | INTRAVENOUS | Status: AC
  Administered 2021-03-11: 500 [IU]

## 2021-03-11 MED ORDER — ACYCLOVIR 400 MG PO TABS: 400.0000 mg | ORAL_TABLET | Freq: Two times a day (BID) | ORAL | 2 refills | Status: DC

## 2021-03-11 MED ORDER — POTASSIUM CHLORIDE CRYS ER 20 MEQ PO TBCR: 20.0000 meq | EXTENDED_RELEASE_TABLET | Freq: Two times a day (BID) | ORAL | 0 refills | Status: DC

## 2021-03-11 MED ORDER — SODIUM CHLORIDE 0.9% FLUSH
10.0000 mL | Freq: Once | INTRAVENOUS | Status: AC
  Administered 2021-03-11: 10 mL

## 2021-03-17 ENCOUNTER — Encounter: Payer: Self-pay | Admitting: Hematology

## 2021-03-17 NOTE — Progress Notes (Addendum)
HEMATOLOGY/ONCOLOGY CLINIC NOTE  Date of Service: .03/11/2021   Patient Care Team: [name], MD as PCP - General (Family Medicine) [name], [name], MD as PCP - Cardiology (Cardiology) [name] [name], MD as Consulting Physician (Gastroenterology)  CHIEF COMPLAINTS/PURPOSE OF CONSULTATION:  Follow-up for continued management of multiple myeloma  HISTORY OF PRESENTING ILLNESS:  Please see previous note for details on initial presentation  INTERVAL HISTORY  Megan Velasquez is a wonderful 60 y.o. female who is here for continued evaluation and management of her multiple myeloma. She is accompanied by her husband. She notes that she has been following up at [facility] for Plerixafor and G-CSF based stem cell mobilization and collection which has been collected. Patient notes she has been off her Revlimid in preparation for doing this.  Patient notes some bone aches and pains from the G-CSF use for stem cell mobilization. No abdominal pain or distention. No fevers no chills no night sweats. Has been eating well and notes no significant fatigue at this time.  Labs done today CBC shows WBC count of 28.5, hemoglobin of 12.1, platelets of 41k CMP potassium of 3, alkaline phosphatase of 135 otherwise unremarkable.  Patient has no signs of infection. We discussed that her leukocytosis is likely from bone marrow stimulation for stem cell collection.  Low platelets could be part of the same medication use but need to be monitored prior to restarting Revlimid.   MEDICAL HISTORY:  Past Medical History:  Diagnosis Date   Cancer ([place])    GERD (gastroesophageal reflux disease)    History of kidney stones    HTN (hypertension)    Renal disorder     SURGICAL HISTORY: Past Surgical History:  Procedure Laterality Date   CHOLECYSTECTOMY     COLONOSCOPY  01/2015   Dr. Britta Mccreedy: Mild diverticulosis, sessile polyp ranging 3 to 5 mm removed from the proximal transverse colon,  semi-pedunculated polyp 5 to 9 mm in size removed from the sigmoid colon.  Sigmoid colon polyp was serrated adenoma, transverse colon polyp was adenomatous.  Patient was told to have another colonoscopy in 3 years.   COLONOSCOPY WITH PROPOFOL N/A 10/11/2017   Procedure: COLONOSCOPY WITH PROPOFOL;  Surgeon: Daneil Dolin, MD;  Location: [facility];  Service: Endoscopy;  Laterality: N/A;  1:15pm   IR IMAGING GUIDED PORT INSERTION  06/05/2020   POLYPECTOMY  10/11/2017   Procedure: POLYPECTOMY;  Surgeon: Daneil Dolin, MD;  Location: [facility];  Service: Endoscopy;;  descending colon polyps cs times 2    SOCIAL HISTORY: Social History   Socioeconomic History   Marital status: Married    Spouse name: Not on file   Number of children: Not on file   Years of education: Not on file   Highest education level: Not on file  Occupational History   Not on file  Tobacco Use   Smoking status: Former    Packs/day: 1.00    Years: 28.00    Pack years: 28.00    Types: Cigarettes    Quit date: 10/08/2013    Years since quitting: 7.4   Smokeless tobacco: Never  Vaping Use   Vaping Use: Never used  Substance and Sexual Activity   Alcohol use: Not Currently   Drug use: Never   Sexual activity: Yes    Birth control/protection: Post-menopausal  Other Topics Concern   Not on file  Social History Narrative   Not on file   Social Determinants of Health  Financial Resource Strain: Low Risk    Difficulty of Paying Living Expenses: Not hard at all  Food Insecurity: No Food Insecurity   Worried About Charity fundraiser in the Last Year: Never true   Ran Out of Food in the Last Year: Never true  Transportation Needs: No Transportation Needs   Lack of Transportation (Medical): No   Lack of Transportation (Non-Medical): No  Physical Activity: Not on file  Stress: No Stress Concern Present   Feeling of Stress : Only a little  Social Connections: Unknown   Frequency of Communication with  Friends and Family: More than three times a week   Frequency of Social Gatherings with Friends and Family: More than three times a week   Attends Religious Services: Not on Electrical engineer or Organizations: Not on file   Attends Archivist Meetings: Not on file   Marital Status: Married  Human resources officer Violence: Not on file    FAMILY HISTORY: Family History  Problem Relation Age of Onset   Heart disease Mother    Diabetes Mother    Lung cancer Father    COPD Father    Colon cancer Neg Hx     ALLERGIES:  has No Known Allergies.  MEDICATIONS:  Current Outpatient Medications  Medication Sig Dispense Refill   potassium chloride SA (KLOR-CON M) 20 MEQ tablet Take 1 tablet (20 mEq total) by mouth 2 (two) times daily. 60 tablet 0   acetaminophen (TYLENOL) 500 MG tablet Take 1,000 mg by mouth every 6 (six) hours as needed for fever or headache.     acyclovir (ZOVIRAX) 400 MG tablet Take 1 tablet (400 mg total) by mouth 2 (two) times daily. 30 tablet 2   Ascorbic Acid (VITAMIN C) 1000 MG tablet Take 1,000 mg by mouth every morning.     b complex vitamins capsule Take 1 capsule by mouth daily. (Patient taking differently: Take 1 capsule by mouth every morning.) 30 capsule 3   chlorhexidine (PERIDEX) 0.12 % solution SMARTSIG:By Mouth     Cholecalciferol (VITAMIN D3) 5000 units CAPS Take 5,000 Units by mouth every morning.     Cyanocobalamin (VITAMIN B12) 3000 MCG SUBL Place 3,000 mcg under the tongue every morning.     dexamethasone (DECADRON) 4 MG tablet TAKE 5 TABLETS BY MOUTH ONCE A WEEK. 40 tablet 2   ELIQUIS 2.5 MG TABS tablet TAKE 1 TABLET BY MOUTH TWICE A DAY 60 tablet 5   lenalidomide (REVLIMID) 10 MG capsule Take 1 capsule by mouth daily at bedtime for 21 days on ans 7 days off 21 capsule 0   lidocaine-prilocaine (EMLA) cream Apply to affected area once (Patient not taking: Reported on 03/11/2021) 30 g 3   loratadine (CLARITIN) 10 MG tablet Take 10 mg by  mouth every morning.     LORazepam (ATIVAN) 0.5 MG tablet Take 1 tablet (0.5 mg total) by mouth every 6 (six) hours as needed (Nausea or vomiting). (Patient not taking: Reported on 07/04/2020) 30 tablet 0   omeprazole (PRILOSEC) 40 MG capsule Take 40 mg by mouth daily.     ondansetron (ZOFRAN) 8 MG tablet Take 1 tablet (8 mg total) by mouth 2 (two) times daily as needed (Nausea or vomiting). 30 tablet 1   prochlorperazine (COMPAZINE) 10 MG tablet Take 1 tablet (10 mg total) by mouth every 6 (six) hours as needed (Nausea or vomiting). 30 tablet 1   REVLIMID 10 MG capsule Take 1 capsule (10 mg  total) by mouth daily. Take 1 capsule (10 mg) by mouth daily. Take for 21 days, then hold for 7 days. Repeat every 28 days. 21 capsule 0   REVLIMID 15 MG capsule TAKE 1 CAPSULE BY MOUTH ONCE DAILY AT BEDTIME FOR 21 DAYS ON AND 7 DAYS OFF (Patient not taking: Reported on 03/11/2021) 21 capsule 0   No current facility-administered medications for this visit.    REVIEW OF SYSTEMS:   .10 Point review of Systems was done is negative except as noted above.  PHYSICAL EXAMINATION: ECOG PERFORMANCE STATUS: 1 - Symptomatic but completely ambulatory NAD . GENERAL:alert, in no acute distress and comfortable SKIN: no acute rashes, no significant lesions EYES: conjunctiva are pink and non-injected, sclera anicteric OROPHARYNX: MMM, no exudates, no oropharyngeal erythema or ulceration NECK: supple, no JVD LYMPH:  no palpable lymphadenopathy in the cervical, axillary or inguinal regions LUNGS: clear to auscultation b/l with normal respiratory effort HEART: regular rate & rhythm ABDOMEN:  normoactive bowel sounds , non tender, not distended. Extremity: no pedal edema PSYCH: alert & oriented x 3 with fluent speech NEURO: no focal motor/sensory deficits  LABORATORY DATA:  I have reviewed the data as listed  . CBC Latest Ref Rng & Units 03/11/2021 01/02/2021 12/26/2020  WBC 4.0 - 10.5 K/uL 28.5(H) 9.0 5.4  Hemoglobin  12.0 - 15.0 g/dL 12.1 11.9(L) 11.5(L)  Hematocrit 36.0 - 46.0 % 35.6(L) 35.2(L) 34.0(L)  Platelets 150 - 400 K/uL 41(L) 132(L) 97(L)    . CMP Latest Ref Rng & Units 03/11/2021 01/02/2021 12/26/2020  Glucose 70 - 99 mg/dL 138(H) 109(H) 104(H)  BUN 6 - 20 mg/dL 9 20 24(H)  Creatinine 0.44 - 1.00 mg/dL 0.87 1.03(H) 0.90  Sodium 135 - 145 mmol/L 142 139 139  Potassium 3.5 - 5.1 mmol/L 3.0(L) 4.2 4.2  Chloride 98 - 111 mmol/L 99 105 107  CO2 22 - 32 mmol/L 31 21(L) 23  Calcium 8.9 - 10.3 mg/dL 8.5(L) 8.7(L) 8.9  Total Protein 6.5 - 8.1 g/dL 5.6(L) 5.8(L) 5.9(L)  Total Bilirubin 0.3 - 1.2 mg/dL 0.4 0.9 0.7  Alkaline Phos 38 - 126 U/L 135(H) 54 62  AST 15 - 41 U/L 13(L) 13(L) 16  ALT 0 - 44 U/L 9 19 14      Most recent lab results (03/15/2020) of CBC is as follows: all values are WNL except for RBC at 3.66, Hgb at 9.7, HCT at 31.8, MCHC at 30.5, RDW Standard Deviation 58.7, Red Cell Distribution Width At 18.6,  BUN at 19, Creatinine at 1.06, Total Protein at 10.2, Albumin at 2.7, AST at 14,. 03/13/2020 UPEP shows no M-Spike, all values are WNL 03/06/2020 SPEP shows Total Protein at 10.1, Beta Globulin at 5.2, M-Spike at 4.3, Total Globulin at 6.4, A/G Ratio at 0.6 03/06/2020 Free Kappa Light Chains at 181.1, Free Lambda Light Chains Serum at 3.6, K/L ratio at 50.31 03/06/2020 Beta-2 Microglobulin at 2.5           RADIOGRAPHIC STUDIES: I have personally reviewed the radiological images as listed and agreed with the findings in the report. No results found.  ASSESSMENT & PLAN:   60 year old female here for continued follow-up, evaluation and management of multiple myeloma  #1 Multiple myeloma -IgA kappa.  Presenting with anemia hemoglobin 9.4. Creatinine 1.28 No hypercalcemia Bone survey with no obvious skeletal lesions. Baseline M spike of 4.3 g/dL. Beta-2 Microglobulin at 2.5 Mol Cy Monosomy 13 and dup 1q  2) status post stem cell collection assisted with G-CSF and  Plerixafor. 3) leukocytosis likely due to bone marrow stimulation recently for stem cell collection 4) thrombocytopenia-likely due to medications for stem cell collection versus Revlimid (though less likely since she has been off Revlimid for stem cell collection) PLAN: -I discussed the patient's lab work 03/11/2021 in detail with her. -Patient has no signs of infection and leukocytosis is likely due to medications used for bone marrow stimulation and stem cell collection. -Due to her thrombocytopenia with platelets of 41k we discussed holding off on starting her maintenance Revlimid at this time. -Outside records from [facility] were reviewed.  Stem cell collection completed.  Patient is not inclined to move ahead with bone marrow transplant at this time and wants to pursue maintenance Rituxan. --Continue masking and aggressive COVID prevention strategies. -Patient is still disinclined with regards to COVID-19 vaccination for use of Evusheld. -Patient will transition to maintenance Revlimid at 10 mg 3 weeks on 1 week off after completing this last cycle of induction treatment. -Continue 2.5 mg Eliquis BID. -Initiated discussion regarding maintenance Zometa  FOLLOW UP: RTC with Dr Irene Limbo with labs in 6 weeks follow-up with labs and MD visit 04/03/2021 to review labs again prior to starting maintenance Revlimid and Zometa.  . The total time spent in the appointment was 32 minutes including review of outside medical records from [facility], discussion of labs treatment plans with the patient.   Sullivan Lone MD MS AAHIVMS [facility] Hematology/Oncology Physician [facility].

## 2021-03-18 ENCOUNTER — Other Ambulatory Visit: Payer: Self-pay

## 2021-03-18 ENCOUNTER — Other Ambulatory Visit: Payer: Self-pay | Admitting: Hematology

## 2021-03-18 DIAGNOSIS — C9000 Multiple myeloma not having achieved remission: Secondary | ICD-10-CM

## 2021-03-18 DIAGNOSIS — Z7189 Other specified counseling: Secondary | ICD-10-CM

## 2021-03-18 MED ORDER — REVLIMID 10 MG PO CAPS: 10.0000 mg | ORAL_CAPSULE | Freq: Every day | ORAL | 0 refills | Status: DC

## 2021-03-26 ENCOUNTER — Other Ambulatory Visit: Payer: Self-pay

## 2021-03-26 DIAGNOSIS — C9000 Multiple myeloma not having achieved remission: Secondary | ICD-10-CM

## 2021-03-26 MED ORDER — REVLIMID 10 MG PO CAPS: 10.0000 mg | ORAL_CAPSULE | Freq: Every day | ORAL | 0 refills | Status: DC

## 2021-04-02 ENCOUNTER — Other Ambulatory Visit: Payer: Self-pay | Admitting: Hematology

## 2021-04-03 ENCOUNTER — Inpatient Hospital Stay: Payer: BC Managed Care – PPO

## 2021-04-03 ENCOUNTER — Other Ambulatory Visit: Payer: Self-pay | Admitting: Hematology

## 2021-04-03 ENCOUNTER — Inpatient Hospital Stay (HOSPITAL_BASED_OUTPATIENT_CLINIC_OR_DEPARTMENT_OTHER): Payer: BC Managed Care – PPO | Admitting: Hematology

## 2021-04-03 ENCOUNTER — Other Ambulatory Visit: Payer: Self-pay

## 2021-04-03 VITALS — BP 117/75 | HR 92 | Temp 98.7°F | Resp 16 | Wt 136.5 lb

## 2021-04-03 DIAGNOSIS — Z95828 Presence of other vascular implants and grafts: Secondary | ICD-10-CM

## 2021-04-03 DIAGNOSIS — Z7189 Other specified counseling: Secondary | ICD-10-CM

## 2021-04-03 DIAGNOSIS — C9001 Multiple myeloma in remission: Secondary | ICD-10-CM

## 2021-04-03 DIAGNOSIS — C9000 Multiple myeloma not having achieved remission: Secondary | ICD-10-CM | POA: Diagnosis not present

## 2021-04-03 DIAGNOSIS — D649 Anemia, unspecified: Secondary | ICD-10-CM

## 2021-04-03 LAB — CBC WITH DIFFERENTIAL/PLATELET
Abs Immature Granulocytes: 0.02 10*3/uL (ref 0.00–0.07)
Basophils Absolute: 0 10*3/uL (ref 0.0–0.1)
Basophils Relative: 0 %
Eosinophils Absolute: 0.1 10*3/uL (ref 0.0–0.5)
Eosinophils Relative: 2 %
HCT: 40.8 % (ref 36.0–46.0)
Hemoglobin: 13.6 g/dL (ref 12.0–15.0)
Immature Granulocytes: 0 %
Lymphocytes Relative: 14 %
Lymphs Abs: 1 10*3/uL (ref 0.7–4.0)
MCH: 31.2 pg (ref 26.0–34.0)
MCHC: 33.3 g/dL (ref 30.0–36.0)
MCV: 93.6 fL (ref 80.0–100.0)
Monocytes Absolute: 0.8 10*3/uL (ref 0.1–1.0)
Monocytes Relative: 11 %
Neutro Abs: 5.2 10*3/uL (ref 1.7–7.7)
Neutrophils Relative %: 73 %
Platelets: 152 10*3/uL (ref 150–400)
RBC: 4.36 MIL/uL (ref 3.87–5.11)
RDW: 12.7 % (ref 11.5–15.5)
WBC: 7.1 10*3/uL (ref 4.0–10.5)
nRBC: 0 % (ref 0.0–0.2)

## 2021-04-03 LAB — CMP (CANCER CENTER ONLY)
ALT: 13 U/L (ref 0–44)
AST: 16 U/L (ref 15–41)
Albumin: 4.4 g/dL (ref 3.5–5.0)
Alkaline Phosphatase: 77 U/L (ref 38–126)
Anion gap: 7 (ref 5–15)
BUN: 25 mg/dL — ABNORMAL HIGH (ref 6–20)
CO2: 28 mmol/L (ref 22–32)
Calcium: 9.6 mg/dL (ref 8.9–10.3)
Chloride: 102 mmol/L (ref 98–111)
Creatinine: 0.95 mg/dL (ref 0.44–1.00)
GFR, Estimated: >60 mL/min (ref >60)
Glucose, Bld: 91 mg/dL (ref 70–99)
Potassium: 4 mmol/L (ref 3.5–5.1)
Sodium: 137 mmol/L (ref 135–145)
Total Bilirubin: 0.4 mg/dL (ref 0.3–1.2)
Total Protein: 6.9 g/dL (ref 6.5–8.1)

## 2021-04-03 LAB — TYPE AND SCREEN
ABO/RH(D): B POS
Antibody Screen: NEGATIVE

## 2021-04-03 MED ORDER — HEPARIN SOD (PORK) LOCK FLUSH 100 UNIT/ML IV SOLN
500.0000 [IU] | Freq: Once | INTRAVENOUS | Status: AC
  Administered 2021-04-03: 10:00:00 500 [IU]

## 2021-04-03 MED ORDER — SODIUM CHLORIDE 0.9% FLUSH
10.0000 mL | Freq: Once | INTRAVENOUS | Status: AC
  Administered 2021-04-03: 10:00:00 10 mL

## 2021-04-08 ENCOUNTER — Telehealth: Payer: Self-pay | Admitting: Hematology

## 2021-04-08 ENCOUNTER — Other Ambulatory Visit: Payer: Self-pay

## 2021-04-08 DIAGNOSIS — C9000 Multiple myeloma not having achieved remission: Secondary | ICD-10-CM

## 2021-04-08 MED ORDER — REVLIMID 10 MG PO CAPS: 10.0000 mg | ORAL_CAPSULE | Freq: Every day | ORAL | 0 refills | Status: DC

## 2021-04-08 NOTE — Telephone Encounter (Signed)
Scheduled follow-up appointments per 12/29 los. Patient is aware. 

## 2021-04-09 ENCOUNTER — Encounter: Payer: Self-pay | Admitting: Hematology

## 2021-04-09 NOTE — Progress Notes (Addendum)
HEMATOLOGY/ONCOLOGY CLINIC NOTE  Date of Service: .04/03/2021   Patient Care Team: [name], MD as PCP - General (Family Medicine) [name], [name], MD as PCP - Cardiology (Cardiology) [name] [name], MD as Consulting Physician (Gastroenterology)  CHIEF COMPLAINTS/PURPOSE OF CONSULTATION:  Call for continued management of multiple myeloma  HISTORY OF PRESENTING ILLNESS:  Please see previous note for details on initial presentation  INTERVAL HISTORY  Megan Velasquez is here for follow-up of her multiple myeloma and abnormal CBC. She notes no acute new concerns since her last clinic visit. Her labs today show that her CBC has normalized and her thrombocytopenia has resolved with a platelet count now of 152k up from 41k.  WBC counts and hemoglobin also normal. She she will be starting her maintenance Revlimid as per our discussion. We again discussed the use of maintenance Zometa every 2 to 3 months and she would still want to think about this prior to deciding. No other acute new symptoms.  MEDICAL HISTORY:  Past Medical History:  Diagnosis Date   Cancer ([place])    GERD (gastroesophageal reflux disease)    History of kidney stones    HTN (hypertension)    Renal disorder     SURGICAL HISTORY: Past Surgical History:  Procedure Laterality Date   CHOLECYSTECTOMY     COLONOSCOPY  01/2015   Dr. Britta Mccreedy: Mild diverticulosis, sessile polyp ranging 3 to 5 mm removed from the proximal transverse colon, semi-pedunculated polyp 5 to 9 mm in size removed from the sigmoid colon.  Sigmoid colon polyp was serrated adenoma, transverse colon polyp was adenomatous.  Patient was told to have another colonoscopy in 3 years.   COLONOSCOPY WITH PROPOFOL N/A 10/11/2017   Procedure: COLONOSCOPY WITH PROPOFOL;  Surgeon: Daneil Dolin, MD;  [facility];  Service: Endoscopy;  Laterality: N/A;  1:15pm   IR IMAGING GUIDED PORT INSERTION  06/05/2020   POLYPECTOMY  10/11/2017   Procedure:  POLYPECTOMY;  Surgeon: Daneil Dolin, MD;  [facility];  Service: Endoscopy;;  descending colon polyps cs times 2    SOCIAL HISTORY: Social History   Socioeconomic History   Marital status: Married    Spouse name: Not on file   Number of children: Not on file   Years of education: Not on file   Highest education level: Not on file  Occupational History   Not on file  Tobacco Use   Smoking status: Former    Packs/day: 1.00    Years: 28.00    Pack years: 28.00    Types: Cigarettes    Quit date: 10/08/2013    Years since quitting: 7.5   Smokeless tobacco: Never  Vaping Use   Vaping Use: Never used  Substance and Sexual Activity   Alcohol use: Not Currently   Drug use: Never   Sexual activity: Yes    Birth control/protection: Post-menopausal  Other Topics Concern   Not on file  Social History Narrative   Not on file   Social Determinants of Health   Financial Resource Strain: Low Risk    Difficulty of Paying Living Expenses: Not hard at all  Food Insecurity: No Food Insecurity   Worried About Charity fundraiser in the Last Year: Never true   [place] in the Last Year: Never true  Transportation Needs: No Transportation Needs   Lack of Transportation (Medical): No   Lack of Transportation (Non-Medical): No  Physical Activity: Not on file  Stress: No Stress Concern Present   Feeling of Stress : Only a little  Social Connections: Unknown   Frequency of Communication with Friends and Family: More than three times a week   Frequency of Social Gatherings with Friends and Family: More than three times a week   Attends Religious Services: Not on Electrical engineer or Organizations: Not on file   Attends Archivist Meetings: Not on file   Marital Status: Married  Human resources officer Violence: Not on file    FAMILY HISTORY: Family History  Problem Relation Age of Onset   Heart disease Mother    Diabetes Mother    Lung cancer  Father    COPD Father    Colon cancer Neg Hx     ALLERGIES:  has No Known Allergies.  MEDICATIONS:  Current Outpatient Medications  Medication Sig Dispense Refill   acetaminophen (TYLENOL) 500 MG tablet Take 1,000 mg by mouth every 6 (six) hours as needed for fever or headache.     acyclovir (ZOVIRAX) 400 MG tablet TAKE 1 TABLET BY MOUTH TWICE A DAY 30 tablet 2   Ascorbic Acid (VITAMIN C) 1000 MG tablet Take 1,000 mg by mouth every morning.     b complex vitamins capsule Take 1 capsule by mouth daily. (Patient taking differently: Take 1 capsule by mouth every morning.) 30 capsule 3   chlorhexidine (PERIDEX) 0.12 % solution SMARTSIG:By Mouth     Cholecalciferol (VITAMIN D3) 5000 units CAPS Take 5,000 Units by mouth every morning.     Cyanocobalamin (VITAMIN B12) 3000 MCG SUBL Place 3,000 mcg under the tongue every morning.     dexamethasone (DECADRON) 4 MG tablet TAKE 5 TABLETS BY MOUTH ONCE A WEEK. 40 tablet 2   ELIQUIS 2.5 MG TABS tablet TAKE 1 TABLET BY MOUTH TWICE A DAY 60 tablet 5   KLOR-CON M20 20 MEQ tablet TAKE 1 TABLET BY MOUTH TWICE A DAY 60 tablet 0   lidocaine-prilocaine (EMLA) cream Apply to affected area once (Patient not taking: Reported on 03/11/2021) 30 g 3   loratadine (CLARITIN) 10 MG tablet Take 10 mg by mouth every morning.     LORazepam (ATIVAN) 0.5 MG tablet Take 1 tablet (0.5 mg total) by mouth every 6 (six) hours as needed (Nausea or vomiting). (Patient not taking: Reported on 07/04/2020) 30 tablet 0   omeprazole (PRILOSEC) 40 MG capsule Take 40 mg by mouth daily.     ondansetron (ZOFRAN) 8 MG tablet Take 1 tablet (8 mg total) by mouth 2 (two) times daily as needed (Nausea or vomiting). 30 tablet 1   prochlorperazine (COMPAZINE) 10 MG tablet Take 1 tablet (10 mg total) by mouth every 6 (six) hours as needed (Nausea or vomiting). 30 tablet 1   REVLIMID 10 MG capsule Take 1 capsule (10 mg total) by mouth daily. Take 1 capsule (10 mg) by mouth daily. Take for 21 days,  then hold for 7 days. Repeat every 28 days. 21 capsule 0   No current facility-administered medications for this visit.    REVIEW OF SYSTEMS:   .10 Point review of Systems was done is negative except as noted above.   PHYSICAL EXAMINATION: ECOG PERFORMANCE STATUS: 1 - Symptomatic but completely ambulatory . GENERAL:alert, in no acute distress and comfortable SKIN: no acute rashes, no significant lesions EYES: conjunctiva are pink and non-injected, sclera anicteric OROPHARYNX: MMM, no exudates, no oropharyngeal erythema or ulceration NECK: supple, no JVD LYMPH:  no palpable lymphadenopathy in  the cervical, axillary or inguinal regions LUNGS: clear to auscultation b/l with normal respiratory effort HEART: regular rate & rhythm ABDOMEN:  normoactive bowel sounds , non tender, not distended. Extremity: no pedal edema PSYCH: alert & oriented x 3 with fluent speech NEURO: no focal motor/sensory deficits   LABORATORY DATA:  I have reviewed the data as listed  . CBC Latest Ref Rng & Units 04/03/2021 03/11/2021 01/02/2021  WBC 4.0 - 10.5 K/uL 7.1 28.5(H) 9.0  Hemoglobin 12.0 - 15.0 g/dL 13.6 12.1 11.9(L)  Hematocrit 36.0 - 46.0 % 40.8 35.6(L) 35.2(L)  Platelets 150 - 400 K/uL 152 41(L) 132(L)    . CMP Latest Ref Rng & Units 04/03/2021 03/11/2021 01/02/2021  Glucose 70 - 99 mg/dL 91 138(H) 109(H)  BUN 6 - 20 mg/dL 25(H) 9 20  Creatinine 0.44 - 1.00 mg/dL 0.95 0.87 1.03(H)  Sodium 135 - 145 mmol/L 137 142 139  Potassium 3.5 - 5.1 mmol/L 4.0 3.0(L) 4.2  Chloride 98 - 111 mmol/L 102 99 105  CO2 22 - 32 mmol/L 28 31 21(L)  Calcium 8.9 - 10.3 mg/dL 9.6 8.5(L) 8.7(L)  Total Protein 6.5 - 8.1 g/dL 6.9 5.6(L) 5.8(L)  Total Bilirubin 0.3 - 1.2 mg/dL 0.4 0.4 0.9  Alkaline Phos 38 - 126 U/L 77 135(H) 54  AST 15 - 41 U/L 16 13(L) 13(L)  ALT 0 - 44 U/L _0 Most recent lab results (03/15/2020) of CBC is as follows: all values are WNL except for RBC at 3.66, Hgb at 9.7, HCT at 31.8,  MCHC at 30.5, RDW Standard Deviation 58.7, Red Cell Distribution Width At 18.6,  BUN at 19, Creatinine at 1.06, Total Protein at 10.2, Albumin at 2.7, AST at 14,. 03/13/2020 UPEP shows no M-Spike, all values are WNL 03/06/2020 SPEP shows Total Protein at 10.1, Beta Globulin at 5.2, M-Spike at 4.3, Total Globulin at 6.4, A/G Ratio at 0.6 03/06/2020 Free Kappa Light Chains at 181.1, Free Lambda Light Chains Serum at 3.6, K/L ratio at 50.31 03/06/2020 Beta-2 Microglobulin at 2.5           RADIOGRAPHIC STUDIES: I have personally reviewed the radiological images as listed and agreed with the findings in the report. No results found.  ASSESSMENT & PLAN:   61 year old female here for continued follow-up, evaluation and management of multiple myeloma  #1  IgA kappa multiple myeloma status post induction treatment with carfilzomib Revlimid dexamethasone  presenting with anemia hemoglobin 9.4. Creatinine 1.28 No hypercalcemia Bone survey with no obvious skeletal lesions. Baseline M spike of 4.3 g/dL. Beta-2 Microglobulin at 2.5 Mol Cy Monosomy 13 and dup 1q  2) status post stem cell collection assisted with G-CSF and Plerixafor. 3) leukocytosis likely due to bone marrow stimulation recently for stem cell collection-resolved today. 4) thrombocytopenia-likely due to medications for stem cell collection versus Revlimid (though less likely since she has been off Revlimid for stem cell collection) now resolved PLAN: -Discussed lab results from today 04/03/2021.  Her leukocytosis and significant thrombocytopenia have resolved. -She has no other acute new symptoms no infection issues. -She will start her Revlimid maintenance at 10 mg 3 weeks on 1 week off. -We will continue acyclovir for at least 6 months post carfilzomib. -Continue low-dose Eliquis 2.5 mg p.o. twice daily for thromboprophylaxis while on Revlimid. -Continue vitamin B complex -Continue vitamin D 5000 units p.o. daily -Patient  has declined official and COVID-19 vaccinations.  She did receive her flu shot.  FOLLOW UP: Labs in  7 weeks Return to clinic with Dr. Irene Limbo in 81 weeks   Sullivan Lone MD [place] AAHIVMS [facility] Hematology/Oncology Physician [facility].

## 2021-04-13 ENCOUNTER — Other Ambulatory Visit: Payer: Self-pay | Admitting: Hematology

## 2021-04-13 NOTE — Addendum Note (Signed)
Addended by: Sullivan Lone on: 04/13/2021 04:44 PM   Modules accepted: Orders

## 2021-04-14 ENCOUNTER — Encounter: Payer: Self-pay | Admitting: Hematology

## 2021-04-18 ENCOUNTER — Other Ambulatory Visit: Payer: Self-pay

## 2021-04-18 DIAGNOSIS — C9000 Multiple myeloma not having achieved remission: Secondary | ICD-10-CM

## 2021-04-18 MED ORDER — REVLIMID 10 MG PO CAPS: 10.0000 mg | ORAL_CAPSULE | Freq: Every day | ORAL | 0 refills | Status: DC

## 2021-05-15 ENCOUNTER — Other Ambulatory Visit: Payer: Self-pay | Admitting: *Deleted

## 2021-05-15 DIAGNOSIS — C9000 Multiple myeloma not having achieved remission: Secondary | ICD-10-CM

## 2021-05-15 MED ORDER — REVLIMID 10 MG PO CAPS: 10.0000 mg | ORAL_CAPSULE | Freq: Every day | ORAL | 0 refills | Status: DC

## 2021-05-18 ENCOUNTER — Other Ambulatory Visit: Payer: Self-pay | Admitting: Hematology

## 2021-05-18 DIAGNOSIS — Z7189 Other specified counseling: Secondary | ICD-10-CM

## 2021-05-18 DIAGNOSIS — C9000 Multiple myeloma not having achieved remission: Secondary | ICD-10-CM

## 2021-05-19 ENCOUNTER — Encounter: Payer: Self-pay | Admitting: Hematology

## 2021-05-21 ENCOUNTER — Other Ambulatory Visit (HOSPITAL_COMMUNITY): Payer: Self-pay

## 2021-05-22 ENCOUNTER — Inpatient Hospital Stay: Payer: BC Managed Care – PPO | Attending: Hematology

## 2021-05-22 ENCOUNTER — Other Ambulatory Visit: Payer: Self-pay | Admitting: Hematology

## 2021-05-22 ENCOUNTER — Other Ambulatory Visit: Payer: Self-pay

## 2021-05-22 DIAGNOSIS — C9000 Multiple myeloma not having achieved remission: Secondary | ICD-10-CM | POA: Diagnosis not present

## 2021-05-22 DIAGNOSIS — Z87891 Personal history of nicotine dependence: Secondary | ICD-10-CM | POA: Diagnosis not present

## 2021-05-22 DIAGNOSIS — Z95828 Presence of other vascular implants and grafts: Secondary | ICD-10-CM

## 2021-05-22 DIAGNOSIS — C9001 Multiple myeloma in remission: Secondary | ICD-10-CM

## 2021-05-22 LAB — CBC WITH DIFFERENTIAL/PLATELET
Abs Immature Granulocytes: 0.01 10*3/uL (ref 0.00–0.07)
Basophils Absolute: 0 10*3/uL (ref 0.0–0.1)
Basophils Relative: 1 %
Eosinophils Absolute: 0.4 10*3/uL (ref 0.0–0.5)
Eosinophils Relative: 9 %
HCT: 37.9 % (ref 36.0–46.0)
Hemoglobin: 13.1 g/dL (ref 12.0–15.0)
Immature Granulocytes: 0 %
Lymphocytes Relative: 22 %
Lymphs Abs: 0.9 10*3/uL (ref 0.7–4.0)
MCH: 30.3 pg (ref 26.0–34.0)
MCHC: 34.6 g/dL (ref 30.0–36.0)
MCV: 87.5 fL (ref 80.0–100.0)
Monocytes Absolute: 0.7 10*3/uL (ref 0.1–1.0)
Monocytes Relative: 16 %
Neutro Abs: 2.3 10*3/uL (ref 1.7–7.7)
Neutrophils Relative %: 52 %
Platelets: 146 10*3/uL — ABNORMAL LOW (ref 150–400)
RBC: 4.33 MIL/uL (ref 3.87–5.11)
RDW: 13.3 % (ref 11.5–15.5)
WBC: 4.3 10*3/uL (ref 4.0–10.5)
nRBC: 0 % (ref 0.0–0.2)

## 2021-05-22 LAB — CMP (CANCER CENTER ONLY)
ALT: 14 U/L (ref 0–44)
AST: 17 U/L (ref 15–41)
Albumin: 4.2 g/dL (ref 3.5–5.0)
Alkaline Phosphatase: 70 U/L (ref 38–126)
Anion gap: 7 (ref 5–15)
BUN: 19 mg/dL (ref 6–20)
CO2: 28 mmol/L (ref 22–32)
Calcium: 9.1 mg/dL (ref 8.9–10.3)
Chloride: 104 mmol/L (ref 98–111)
Creatinine: 0.95 mg/dL (ref 0.44–1.00)
GFR, Estimated: >60 mL/min (ref >60)
Glucose, Bld: 101 mg/dL — ABNORMAL HIGH (ref 70–99)
Potassium: 3.6 mmol/L (ref 3.5–5.1)
Sodium: 139 mmol/L (ref 135–145)
Total Bilirubin: 0.7 mg/dL (ref 0.3–1.2)
Total Protein: 6.2 g/dL — ABNORMAL LOW (ref 6.5–8.1)

## 2021-05-22 MED ORDER — SODIUM CHLORIDE 0.9% FLUSH
10.0000 mL | Freq: Once | INTRAVENOUS | Status: AC
  Administered 2021-05-22: 10 mL

## 2021-05-22 MED ORDER — HEPARIN SOD (PORK) LOCK FLUSH 100 UNIT/ML IV SOLN
500.0000 [IU] | Freq: Once | INTRAVENOUS | Status: AC
  Administered 2021-05-22: 500 [IU]

## 2021-05-23 LAB — KAPPA/LAMBDA LIGHT CHAINS
Kappa free light chain: 13.6 mg/L (ref 3.3–19.4)
Kappa, lambda light chain ratio: 1.13 (ref 0.26–1.65)
Lambda free light chains: 12 mg/L (ref 5.7–26.3)

## 2021-05-26 ENCOUNTER — Telehealth: Payer: Self-pay | Admitting: *Deleted

## 2021-05-26 LAB — MULTIPLE MYELOMA PANEL, SERUM
Albumin SerPl Elph-Mcnc: 3.7 g/dL (ref 2.9–4.4)
Albumin/Glob SerPl: 1.6 (ref 0.7–1.7)
Alpha 1: 0.2 g/dL (ref 0.0–0.4)
Alpha2 Glob SerPl Elph-Mcnc: 0.8 g/dL (ref 0.4–1.0)
B-Globulin SerPl Elph-Mcnc: 0.8 g/dL (ref 0.7–1.3)
Gamma Glob SerPl Elph-Mcnc: 0.6 g/dL (ref 0.4–1.8)
Globulin, Total: 2.4 g/dL (ref 2.2–3.9)
IgA: 66 mg/dL — ABNORMAL LOW (ref 87–352)
IgG (Immunoglobin G), Serum: 511 mg/dL — ABNORMAL LOW (ref 586–1602)
IgM (Immunoglobulin M), Srm: 30 mg/dL (ref 26–217)
Total Protein ELP: 6.1 g/dL (ref 6.0–8.5)

## 2021-05-26 NOTE — Telephone Encounter (Signed)
FMLA extension request for Megan Velasquez spouse Megan Velasquez successfully faxed to [place] and [employer].  Original copies to alphabetical file folder behind appointment registration area one for patient pick up.

## 2021-05-29 ENCOUNTER — Inpatient Hospital Stay: Payer: BC Managed Care – PPO | Admitting: Hematology

## 2021-05-29 ENCOUNTER — Other Ambulatory Visit: Payer: Self-pay

## 2021-05-29 VITALS — BP 134/78 | HR 70 | Temp 97.7°F | Resp 18 | Wt 135.6 lb

## 2021-05-29 DIAGNOSIS — Z95828 Presence of other vascular implants and grafts: Secondary | ICD-10-CM | POA: Diagnosis not present

## 2021-05-29 DIAGNOSIS — C9001 Multiple myeloma in remission: Secondary | ICD-10-CM

## 2021-05-29 DIAGNOSIS — C9000 Multiple myeloma not having achieved remission: Secondary | ICD-10-CM | POA: Diagnosis not present

## 2021-05-29 NOTE — Progress Notes (Signed)
HEMATOLOGY/ONCOLOGY CLINIC NOTE  Date of Service: .05/29/2021   Patient Care Team: [name], MD as PCP - General (Family Medicine) [name], [name], MD as PCP - Cardiology (Cardiology) [name] [name], MD as Consulting Physician (Gastroenterology)  CHIEF COMPLAINTS/PURPOSE OF CONSULTATION:  Continued follow-up for management of multiple myeloma  HISTORY OF PRESENTING ILLNESS:  Please see previous note for details on initial presentation  INTERVAL HISTORY  Megan Velasquez is here for continued evaluation and management of her multiple myeloma.  She is accompanied by her husband for this visit. She notes that she did have some sore throat and was diagnosed with strep throat and was given antibiotics with resolution of symptoms. Currently no fevers no chills no throat discomfort no shortness of breath. She notes some grade 1 fatigue. Some mild grade 1 muscle cramps. No other significant toxicities reported from her current dose of maintenance Revlimid. Overall she is in good spirits. She notes that she does not feel she can get back to her previous job and work at that intensity. Labs from 05/22/2021 reviewed with the patient.  MEDICAL HISTORY:  Past Medical History:  Diagnosis Date   Cancer ([place])    GERD (gastroesophageal reflux disease)    History of kidney stones    HTN (hypertension)    Renal disorder     SURGICAL HISTORY: Past Surgical History:  Procedure Laterality Date   CHOLECYSTECTOMY     COLONOSCOPY  01/2015   Dr. Britta Mccreedy: Mild diverticulosis, sessile polyp ranging 3 to 5 mm removed from the proximal transverse colon, semi-pedunculated polyp 5 to 9 mm in size removed from the sigmoid colon.  Sigmoid colon polyp was serrated adenoma, transverse colon polyp was adenomatous.  Patient was told to have another colonoscopy in 3 years.   COLONOSCOPY WITH PROPOFOL N/A 10/11/2017   Procedure: COLONOSCOPY WITH PROPOFOL;  Surgeon: Daneil Dolin, MD;  [facility];  Service: Endoscopy;  Laterality: N/A;  1:15pm   IR IMAGING GUIDED PORT INSERTION  06/05/2020   POLYPECTOMY  10/11/2017   Procedure: POLYPECTOMY;  Surgeon: Daneil Dolin, MD;  [facility];  Service: Endoscopy;;  descending colon polyps cs times 2    SOCIAL HISTORY: Social History   Socioeconomic History   Marital status: Married    Spouse name: Not on file   Number of children: Not on file   Years of education: Not on file   Highest education level: Not on file  Occupational History   Not on file  Tobacco Use   Smoking status: Former    Packs/day: 1.00    Years: 28.00    Pack years: 28.00    Types: Cigarettes    Quit date: 10/08/2013    Years since quitting: 7.6   Smokeless tobacco: Never  Vaping Use   Vaping Use: Never used  Substance and Sexual Activity   Alcohol use: Not Currently   Drug use: Never   Sexual activity: Yes    Birth control/protection: Post-menopausal  Other Topics Concern   Not on file  Social History Narrative   Not on file   Social Determinants of Health   Financial Resource Strain: Low Risk    Difficulty of Paying Living Expenses: Not hard at all  Food Insecurity: No Food Insecurity   Worried About Charity fundraiser in the Last Year: Never true   Ran Out of Food in the Last Year: Never true  Transportation Needs: No Transportation Needs   Lack  of Transportation (Medical): No   Lack of Transportation (Non-Medical): No  Physical Activity: Not on file  Stress: No Stress Concern Present   Feeling of Stress : Only a little  Social Connections: Unknown   Frequency of Communication with Friends and Family: More than three times a week   Frequency of Social Gatherings with Friends and Family: More than three times a week   Attends Religious Services: Not on Electrical engineer or Organizations: Not on file   Attends Archivist Meetings: Not on file   Marital Status: Married  Human resources officer Violence: Not  on file    FAMILY HISTORY: Family History  Problem Relation Age of Onset   Heart disease Mother    Diabetes Mother    Lung cancer Father    COPD Father    Colon cancer Neg Hx     ALLERGIES:  has No Known Allergies.  MEDICATIONS:  Current Outpatient Medications  Medication Sig Dispense Refill   acetaminophen (TYLENOL) 500 MG tablet Take 1,000 mg by mouth every 6 (six) hours as needed for fever or headache.     ADVAIR HFA 230-21 MCG/ACT inhaler Inhale 2 puffs into the lungs 2 (two) times daily.     Ascorbic Acid (VITAMIN C) 1000 MG tablet Take 1,000 mg by mouth every morning.     b complex vitamins capsule Take 1 capsule by mouth daily. (Patient taking differently: Take 1 capsule by mouth every morning.) 30 capsule 3   Cholecalciferol (VITAMIN D3) 5000 units CAPS Take 5,000 Units by mouth every morning.     Cyanocobalamin (VITAMIN B12) 3000 MCG SUBL Place 3,000 mcg under the tongue every morning.     ELIQUIS 2.5 MG TABS tablet TAKE 1 TABLET BY MOUTH TWICE A DAY 60 tablet 5   KLOR-CON M20 20 MEQ tablet TAKE 1 TABLET BY MOUTH TWICE A DAY 180 tablet 1   lidocaine-prilocaine (EMLA) cream Apply to affected area once 30 g 3   loratadine (CLARITIN) 10 MG tablet Take 10 mg by mouth every morning.     LORazepam (ATIVAN) 0.5 MG tablet Take 1 tablet (0.5 mg total) by mouth every 6 (six) hours as needed (Nausea or vomiting). 30 tablet 0   omeprazole (PRILOSEC) 40 MG capsule Take 40 mg by mouth daily.     ondansetron (ZOFRAN) 8 MG tablet Take 1 tablet (8 mg total) by mouth 2 (two) times daily as needed (Nausea or vomiting). 30 tablet 1   prochlorperazine (COMPAZINE) 10 MG tablet Take 1 tablet (10 mg total) by mouth every 6 (six) hours as needed (Nausea or vomiting). 30 tablet 1   REVLIMID 10 MG capsule Take 1 capsule (10 mg total) by mouth daily. Take 1 capsule (10 mg) by mouth daily. Take for 21 days, then hold for 7 days. Repeat every 28 days. 21 capsule 0   acyclovir (ZOVIRAX) 400 MG tablet TAKE  1 TABLET BY MOUTH TWICE A DAY 30 tablet 1   chlorhexidine (PERIDEX) 0.12 % solution SMARTSIG:By Mouth (Patient not taking: Reported on 05/29/2021)     dexamethasone (DECADRON) 4 MG tablet TAKE 5 TABLETS BY MOUTH ONCE A WEEK. (Patient not taking: Reported on 05/29/2021) 40 tablet 2   No current facility-administered medications for this visit.    REVIEW OF SYSTEMS:   10 Point review of Systems was done is negative except as noted above.   PHYSICAL EXAMINATION: ECOG PERFORMANCE STATUS: 1 - Symptomatic but completely ambulatory .BP 134/78  Pulse 70    Temp 97.7 F (36.5 C)    Resp 18    Wt 135 lb 9.6 oz (61.5 kg)    SpO2 100%    BMI 22.57 kg/m  NAD GENERAL:alert, in no acute distress and comfortable SKIN: no acute rashes, no significant lesions EYES: conjunctiva are pink and non-injected, sclera anicteric OROPHARYNX: MMM, no exudates, no oropharyngeal erythema or ulceration NECK: supple, no JVD LYMPH:  no palpable lymphadenopathy in the cervical, axillary or inguinal regions LUNGS: clear to auscultation b/l with normal respiratory effort HEART: regular rate & rhythm ABDOMEN:  normoactive bowel sounds , non tender, not distended. Extremity: no pedal edema PSYCH: alert & oriented x 3 with fluent speech NEURO: no focal motor/sensory deficits    LABORATORY DATA:  I have reviewed the data as listed  . CBC Latest Ref Rng & Units 05/22/2021 04/03/2021 03/11/2021  WBC 4.0 - 10.5 K/uL 4.3 7.1 28.5(H)  Hemoglobin 12.0 - 15.0 g/dL 13.1 13.6 12.1  Hematocrit 36.0 - 46.0 % 37.9 40.8 35.6(L)  Platelets 150 - 400 K/uL 146(L) 152 41(L)    . CMP Latest Ref Rng & Units 05/22/2021 04/03/2021 03/11/2021  Glucose 70 - 99 mg/dL 101(H) 91 138(H)  BUN 6 - 20 mg/dL 19 25(H) 9  Creatinine 0.44 - 1.00 mg/dL 0.95 0.95 0.87  Sodium 135 - 145 mmol/L 139 137 142  Potassium 3.5 - 5.1 mmol/L 3.6 4.0 3.0(L)  Chloride 98 - 111 mmol/L 104 102 99  CO2 22 - 32 mmol/L 28 28 31   Calcium 8.9 - 10.3 mg/dL 9.1 9.6  8.5(L)  Total Protein 6.5 - 8.1 g/dL 6.2(L) 6.9 5.6(L)  Total Bilirubin 0.3 - 1.2 mg/dL 0.7 0.4 0.4  Alkaline Phos 38 - 126 U/L 70 77 135(H)  AST 15 - 41 U/L 17 16 13(L)  ALT 0 - 44 U/L 14 13 9      Most recent lab results (03/15/2020) of CBC is as follows: all values are WNL except for RBC at 3.66, Hgb at 9.7, HCT at 31.8, MCHC at 30.5, RDW Standard Deviation 58.7, Red Cell Distribution Width At 18.6,  BUN at 19, Creatinine at 1.06, Total Protein at 10.2, Albumin at 2.7, AST at 14,. 03/13/2020 UPEP shows no M-Spike, all values are WNL 03/06/2020 SPEP shows Total Protein at 10.1, Beta Globulin at 5.2, M-Spike at 4.3, Total Globulin at 6.4, A/G Ratio at 0.6 03/06/2020 Free Kappa Light Chains at 181.1, Free Lambda Light Chains Serum at 3.6, K/L ratio at 50.31 03/06/2020 Beta-2 Microglobulin at 2.5           RADIOGRAPHIC STUDIES: I have personally reviewed the radiological images as listed and agreed with the findings in the report. No results found.  ASSESSMENT & PLAN:   61 year old female here for continued follow-up, evaluation and management of multiple myeloma  #1  IgA kappa multiple myeloma status post induction treatment with carfilzomib Revlimid dexamethasone  presenting with anemia hemoglobin 9.4. Creatinine 1.28 No hypercalcemia Bone survey with no obvious skeletal lesions. Baseline M spike of 4.3 g/dL. Beta-2 Microglobulin at 2.5 Mol Cy Monosomy 13 and dup 1q  2) status post stem cell collection assisted with G-CSF and Plerixafor. Currently on maintenance Revlimid. Currently had declined proceeding with consolidative autologous HSCT  3) recent strep throat treated resolved. PLAN: -Discussed patient's lab results from 05/22/2021  CBC within normal limits with hemoglobin of 13.1, WBC count of 4.3k platelets of 146k CMP within normal limits.  Creatinine 0.95.  Calcium 9.1. Myeloma panel shows  no monoclonal protein spike.  IFE unremarkable. Kappa lambda free light  chains within normal limits with normal ratio.  Patient reports no significant toxicities from her current dose of maintenance Revlimid. We will continue maintenance Revlimid at 10 milligrams 3 weeks on 1 week off. She will continue low-dose Eliquis 2.5 mg p.o. twice daily for thromboprophylaxis while on Revlimid. Continue acyclovir for additional 4 months to complete 6 months post carfilzomib. Continue vitamin B complex 1 capsule p.o. daily Continue vitamin D 5000 units p.o. daily.  Follow-up with primary care physician for age and condition appropriate vaccination and ongoing cancer screening.  FOLLOW UP: Portflush and Labs in 7 weeks Return to clinic with Dr. Irene Limbo in 86 weeks   Sullivan Lone MD [place] AAHIVMS [facility] Hematology/Oncology Physician [facility].Strept throat+

## 2021-05-30 ENCOUNTER — Other Ambulatory Visit: Payer: Self-pay | Admitting: Hematology

## 2021-05-30 DIAGNOSIS — C9000 Multiple myeloma not having achieved remission: Secondary | ICD-10-CM

## 2021-05-30 DIAGNOSIS — Z7189 Other specified counseling: Secondary | ICD-10-CM

## 2021-06-04 ENCOUNTER — Encounter: Payer: Self-pay | Admitting: Hematology

## 2021-06-11 ENCOUNTER — Other Ambulatory Visit: Payer: Self-pay

## 2021-06-11 DIAGNOSIS — C9000 Multiple myeloma not having achieved remission: Secondary | ICD-10-CM

## 2021-06-11 MED ORDER — REVLIMID 10 MG PO CAPS: 10.0000 mg | ORAL_CAPSULE | Freq: Every day | ORAL | 0 refills | Status: DC

## 2021-06-12 ENCOUNTER — Other Ambulatory Visit: Payer: Self-pay | Admitting: Hematology

## 2021-06-12 DIAGNOSIS — Z7189 Other specified counseling: Secondary | ICD-10-CM

## 2021-06-12 DIAGNOSIS — C9000 Multiple myeloma not having achieved remission: Secondary | ICD-10-CM

## 2021-07-10 ENCOUNTER — Other Ambulatory Visit: Payer: Self-pay | Admitting: *Deleted

## 2021-07-10 DIAGNOSIS — C9000 Multiple myeloma not having achieved remission: Secondary | ICD-10-CM

## 2021-07-10 MED ORDER — REVLIMID 10 MG PO CAPS: 10.0000 mg | ORAL_CAPSULE | Freq: Every day | ORAL | 0 refills | Status: DC

## 2021-07-11 ENCOUNTER — Encounter: Payer: Self-pay | Admitting: *Deleted

## 2021-07-11 ENCOUNTER — Other Ambulatory Visit (HOSPITAL_COMMUNITY): Payer: Self-pay

## 2021-07-14 ENCOUNTER — Ambulatory Visit: Payer: BC Managed Care – PPO | Admitting: Cardiology

## 2021-07-14 ENCOUNTER — Encounter: Payer: Self-pay | Admitting: Cardiology

## 2021-07-14 VITALS — BP 122/66 | HR 71 | Ht 65.0 in | Wt 137.0 lb

## 2021-07-14 DIAGNOSIS — M79606 Pain in leg, unspecified: Secondary | ICD-10-CM

## 2021-07-14 DIAGNOSIS — R42 Dizziness and giddiness: Secondary | ICD-10-CM

## 2021-07-14 NOTE — Addendum Note (Signed)
Addended by: Sung Amabile on: 07/14/2021 03:47 PM ? ? Modules accepted: Orders ? ?

## 2021-07-14 NOTE — Patient Instructions (Addendum)
Medication Instructions:  ?Continue all current medications. ? ?Labwork: ?none ? ?Testing/Procedures: ?Your physician has requested that you have an ankle brachial index (ABI). During this test an ultrasound and blood pressure cuff are used to evaluate the arteries that supply the arms and legs with blood. Allow thirty minutes for this exam. There are no restrictions or special instructions. ?Office will contact with results via phone or letter.    ? ?Follow-Up: ?Pending test results  ? ?Any Other Special Instructions Will Be Listed Below (If Applicable). ? ? ?If you need a refill on your cardiac medications before your next appointment, please call your pharmacy. ? ?

## 2021-07-14 NOTE — Progress Notes (Signed)
? ? ? ?Clinical Summary ?Ms. Simm is a 61 y.o.female seen today as a new patient for the following medical problems.  ? ? ?1.Leg pain/Discoloration ?- right foot great toe and 2 adacent.  ?- not painfull, dark bluish purple. No specific trigger ?- no foot sores ?- can have cramping calves. Often comes on at rest.  ?- former smoker x 40 years ? ? ?- tingling in hand, all 5 fingers. No weakness.  ? ? ?2.Multiple myeloma ? ? ?3. Lightheadedness/dizziness ?- last week episode of lightheadensess. No other associated symptoms. Symptoms lasted a few minutes.  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 ? ?Past Medical History:  ?Diagnosis Date  ? Cancer [facility])   ? GERD (gastroesophageal reflux disease)   ? History of kidney stones   ? HTN (hypertension)   ? Renal disorder   ? ? ? ?No Known Allergies ? ? ?Current Outpatient Medications  ?Medication Sig Dispense Refill  ? acetaminophen (TYLENOL) 500 MG tablet Take 1,000 mg by mouth every 6 (six) hours as needed for fever or headache.    ? acyclovir (ZOVIRAX) 400 MG tablet TAKE 1 TABLET BY MOUTH TWICE A DAY 30 tablet 1  ? ADVAIR HFA 230-21 MCG/ACT inhaler Inhale 2 puffs into the lungs 2 (two) times daily.    ? Ascorbic Acid (VITAMIN C) 1000 MG tablet Take 1,000 mg by mouth every morning.    ? b complex vitamins capsule Take 1 capsule by mouth daily. (Patient taking differently: Take 1 capsule by mouth every morning.) 30 capsule 3  ? Cholecalciferol (VITAMIN D3) 5000 units CAPS Take 5,000 Units by mouth every morning.    ? Cyanocobalamin (VITAMIN B12) 3000 MCG SUBL Place 3,000 mcg under the tongue every morning.    ? ELIQUIS 2.5 MG TABS tablet TAKE 1 TABLET BY MOUTH TWICE A DAY 60 tablet 5  ? loratadine (CLARITIN) 10 MG tablet Take 10 mg by mouth every morning.    ? omeprazole (PRILOSEC) 40 MG capsule Take 40 mg by mouth daily.    ? ondansetron (ZOFRAN) 8 MG tablet Take 1 tablet (8 mg  total) by mouth 2 (two) times daily as needed (Nausea or vomiting). 30 tablet 1  ? REVLIMID 10 MG capsule Take 1 capsule (10 mg total) by mouth daily. Take 1 capsule (10 mg) by mouth daily. Take for 21 days, then hold for 7 days. Repeat every 28 days. 21 capsule 0  ? chlorhexidine (PERIDEX) 0.12 % solution SMARTSIG:By Mouth (Patient not taking: Reported on 05/29/2021)    ? dexamethasone (DECADRON) 4 MG tablet TAKE 5 TABLETS BY MOUTH ONCE A WEEK. (Patient not taking: Reported on 05/29/2021) 40 tablet 2  ? KLOR-CON M20 20 MEQ tablet TAKE 1 TABLET BY MOUTH TWICE A DAY (Patient not taking: Reported on 07/14/2021) 180 tablet 1  ? lidocaine-prilocaine (EMLA) cream Apply to affected area once (Patient not taking: Reported on 07/14/2021) 30 g 3  ? LORazepam (ATIVAN) 0.5 MG tablet Take 1 tablet (0.5 mg total) by mouth every 6 (six) hours as needed (Nausea or vomiting). (Patient not taking: Reported on 07/14/2021) 30 tablet 0  ? prochlorperazine (COMPAZINE) 10 MG tablet Take 1 tablet (10 mg total) by mouth every 6 (six) hours as needed (Nausea or vomiting). (Patient not taking: Reported on 07/14/2021) 30 tablet 1  ? ?No current facility-administered medications for this visit.  ? ? ? ?Past Surgical History:  ?  Procedure Laterality Date  ? CHOLECYSTECTOMY    ? COLONOSCOPY  01/2015  ? Dr. Britta Mccreedy: Mild diverticulosis, sessile polyp ranging 3 to 5 mm removed from the proximal transverse colon, semi-pedunculated polyp 5 to 9 mm in size removed from the sigmoid colon.  Sigmoid colon polyp was serrated adenoma, transverse colon polyp was adenomatous.  Patient was told to have another colonoscopy in 3 years.  ? COLONOSCOPY WITH PROPOFOL N/A 10/11/2017  ? Procedure: COLONOSCOPY WITH PROPOFOL;  Surgeon: Daneil Dolin, MD;  [facility]: [facility];  Service: Endoscopy;  Laterality: N/A;  1:15pm  ? IR IMAGING GUIDED PORT INSERTION  06/05/2020  ? POLYPECTOMY  10/11/2017  ? Procedure: POLYPECTOMY;  Surgeon: Daneil Dolin, MD;  Location: [facility];  Service: Endoscopy;;  descending colon polyps cs times 2  ? ? ? ?No Known Allergies ? ? ? ?Family History  ?Problem Relation Age of Onset  ? Heart disease Mother   ? Diabetes Mother   ? Lung cancer Father   ? COPD Father   ? Colon cancer Neg Hx   ? ? ? ?Social History ?Ms. Jollie reports that she quit smoking about 7 years ago. Her smoking use included cigarettes. She has a 28.00 pack-year smoking history. She has never used smokeless tobacco. ?Ms. Occhipinti reports that she does not currently use alcohol. ? ? ?Review of Systems ?CONSTITUTIONAL: No weight loss, fever, chills, weakness or fatigue.  ?HEENT: Eyes: No visual loss, blurred vision, double vision or yellow sclerae.No hearing loss, sneezing, congestion, runny nose or sore throat.  ?SKIN: No rash or itching.  ?CARDIOVASCULAR: per hpi ?RESPIRATORY: No shortness of breath, cough or sputum.  ?GASTROINTESTINAL: No anorexia, nausea, vomiting or diarrhea. No abdominal pain or blood.  ?GENITOURINARY: No burning on urination, no polyuria ?NEUROLOGICAL: per hpi  ?MUSCULOSKELETAL: No muscle, back pain, joint pain or stiffness.  ?LYMPHATICS: No enlarged nodes. No history of splenectomy.  ?PSYCHIATRIC: No history of depression or anxiety.  ?ENDOCRINOLOGIC: No reports of sweating, cold or heat intolerance. No polyuria or polydipsia.  ?. ? ? ?Physical Examination ?Vitals:  ? 07/14/21 1047  ?BP: 122/66  ?Pulse: 71  ?SpO2: 98%  ? ?Filed Weights  ? 07/14/21 1047  ?Weight: 137 lb (62.1 kg)  ? ? ?Gen: resting comfortably, no acute distress ?HEENT: no scleral icterus, pupils equal round and reactive, no palptable cervical adenopathy,  ?CV: RRR, no m/r/g no jvd. Decreased fright DP and PT pulses ?Resp: Clear to auscultation bilaterally ?GI: abdomen is soft, non-tender, non-distended, normal bowel sounds, no hepatosplenomegaly ?MSK: extremities are warm, no edema.  ?Skin: warm, no rash ?Neuro:  no focal deficits ?Psych: appropriate affect ? ? ? ? ? ?Assessment and Plan   ?1.Leg pain/discoloratino ?- symptoms not classic claudication but long prior smoking history and decreased pulses on exam. Will obtain ABIs ? ?2. Dizziness ?- isoalted episode, at this time would just monitor. If significnat recurrences could consider home monitor.  ?- with chronic diarrhea/GI lossess emphasized aggressive hydration.  ? ? ?F/u pending ABI results ? ? ?Arnoldo Lenis, M.D. ?

## 2021-07-15 ENCOUNTER — Other Ambulatory Visit: Payer: Self-pay | Admitting: Hematology

## 2021-07-15 DIAGNOSIS — Z7189 Other specified counseling: Secondary | ICD-10-CM

## 2021-07-15 DIAGNOSIS — C9000 Multiple myeloma not having achieved remission: Secondary | ICD-10-CM

## 2021-07-17 ENCOUNTER — Other Ambulatory Visit: Payer: Self-pay

## 2021-07-17 ENCOUNTER — Inpatient Hospital Stay: Payer: BC Managed Care – PPO | Attending: Hematology

## 2021-07-17 DIAGNOSIS — Z79899 Other long term (current) drug therapy: Secondary | ICD-10-CM | POA: Insufficient documentation

## 2021-07-17 DIAGNOSIS — C9001 Multiple myeloma in remission: Secondary | ICD-10-CM

## 2021-07-17 DIAGNOSIS — R197 Diarrhea, unspecified: Secondary | ICD-10-CM | POA: Insufficient documentation

## 2021-07-17 DIAGNOSIS — C9000 Multiple myeloma not having achieved remission: Secondary | ICD-10-CM | POA: Diagnosis present

## 2021-07-17 DIAGNOSIS — Z95828 Presence of other vascular implants and grafts: Secondary | ICD-10-CM

## 2021-07-17 LAB — CMP (CANCER CENTER ONLY)
ALT: 16 U/L (ref 0–44)
AST: 17 U/L (ref 15–41)
Albumin: 4.2 g/dL (ref 3.5–5.0)
Alkaline Phosphatase: 79 U/L (ref 38–126)
Anion gap: 8 (ref 5–15)
BUN: 22 mg/dL (ref 8–23)
CO2: 27 mmol/L (ref 22–32)
Calcium: 9 mg/dL (ref 8.9–10.3)
Chloride: 104 mmol/L (ref 98–111)
Creatinine: 1.03 mg/dL — ABNORMAL HIGH (ref 0.44–1.00)
GFR, Estimated: >60 mL/min (ref >60)
Glucose, Bld: 130 mg/dL — ABNORMAL HIGH (ref 70–99)
Potassium: 3.5 mmol/L (ref 3.5–5.1)
Sodium: 139 mmol/L (ref 135–145)
Total Bilirubin: 0.7 mg/dL (ref 0.3–1.2)
Total Protein: 6.5 g/dL (ref 6.5–8.1)

## 2021-07-17 LAB — CBC WITH DIFFERENTIAL/PLATELET
Abs Immature Granulocytes: 0.02 10*3/uL (ref 0.00–0.07)
Basophils Absolute: 0.1 10*3/uL (ref 0.0–0.1)
Basophils Relative: 2 %
Eosinophils Absolute: 0.3 10*3/uL (ref 0.0–0.5)
Eosinophils Relative: 6 %
HCT: 39.2 % (ref 36.0–46.0)
Hemoglobin: 13.5 g/dL (ref 12.0–15.0)
Immature Granulocytes: 0 %
Lymphocytes Relative: 23 %
Lymphs Abs: 1.1 10*3/uL (ref 0.7–4.0)
MCH: 30.5 pg (ref 26.0–34.0)
MCHC: 34.4 g/dL (ref 30.0–36.0)
MCV: 88.5 fL (ref 80.0–100.0)
Monocytes Absolute: 0.6 10*3/uL (ref 0.1–1.0)
Monocytes Relative: 12 %
Neutro Abs: 2.7 10*3/uL (ref 1.7–7.7)
Neutrophils Relative %: 57 %
Platelets: 156 10*3/uL (ref 150–400)
RBC: 4.43 MIL/uL (ref 3.87–5.11)
RDW: 14.8 % (ref 11.5–15.5)
WBC: 4.8 10*3/uL (ref 4.0–10.5)
nRBC: 0 % (ref 0.0–0.2)

## 2021-07-17 MED ORDER — SODIUM CHLORIDE 0.9% FLUSH
10.0000 mL | Freq: Once | INTRAVENOUS | Status: AC
  Administered 2021-07-17: 10 mL

## 2021-07-17 MED ORDER — HEPARIN SOD (PORK) LOCK FLUSH 100 UNIT/ML IV SOLN
500.0000 [IU] | Freq: Once | INTRAVENOUS | Status: AC
  Administered 2021-07-17: 500 [IU]

## 2021-07-24 ENCOUNTER — Other Ambulatory Visit: Payer: Self-pay

## 2021-07-24 ENCOUNTER — Inpatient Hospital Stay: Payer: BC Managed Care – PPO | Admitting: Hematology

## 2021-07-24 ENCOUNTER — Inpatient Hospital Stay: Payer: BC Managed Care – PPO

## 2021-07-24 VITALS — BP 111/68 | HR 72 | Temp 98.4°F | Resp 18 | Ht 65.0 in | Wt 133.5 lb

## 2021-07-24 DIAGNOSIS — C9001 Multiple myeloma in remission: Secondary | ICD-10-CM

## 2021-07-24 DIAGNOSIS — C9000 Multiple myeloma not having achieved remission: Secondary | ICD-10-CM | POA: Diagnosis not present

## 2021-07-24 DIAGNOSIS — Z7189 Other specified counseling: Secondary | ICD-10-CM | POA: Diagnosis not present

## 2021-07-24 LAB — CBC WITH DIFFERENTIAL/PLATELET
Abs Immature Granulocytes: 0.01 10*3/uL (ref 0.00–0.07)
Basophils Absolute: 0.1 10*3/uL (ref 0.0–0.1)
Basophils Relative: 2 %
Eosinophils Absolute: 0.1 10*3/uL (ref 0.0–0.5)
Eosinophils Relative: 2 %
HCT: 41.2 % (ref 36.0–46.0)
Hemoglobin: 14 g/dL (ref 12.0–15.0)
Immature Granulocytes: 0 %
Lymphocytes Relative: 23 %
Lymphs Abs: 1.2 10*3/uL (ref 0.7–4.0)
MCH: 30.6 pg (ref 26.0–34.0)
MCHC: 34 g/dL (ref 30.0–36.0)
MCV: 90 fL (ref 80.0–100.0)
Monocytes Absolute: 0.7 10*3/uL (ref 0.1–1.0)
Monocytes Relative: 14 %
Neutro Abs: 3 10*3/uL (ref 1.7–7.7)
Neutrophils Relative %: 59 %
Platelets: 196 10*3/uL (ref 150–400)
RBC: 4.58 MIL/uL (ref 3.87–5.11)
RDW: 15 % (ref 11.5–15.5)
WBC: 5.1 10*3/uL (ref 4.0–10.5)
nRBC: 0 % (ref 0.0–0.2)

## 2021-07-24 LAB — CMP (CANCER CENTER ONLY)
ALT: 14 U/L (ref 0–44)
AST: 16 U/L (ref 15–41)
Albumin: 4.3 g/dL (ref 3.5–5.0)
Alkaline Phosphatase: 86 U/L (ref 38–126)
Anion gap: 7 (ref 5–15)
BUN: 23 mg/dL (ref 8–23)
CO2: 28 mmol/L (ref 22–32)
Calcium: 9.4 mg/dL (ref 8.9–10.3)
Chloride: 103 mmol/L (ref 98–111)
Creatinine: 0.96 mg/dL (ref 0.44–1.00)
GFR, Estimated: >60 mL/min (ref >60)
Glucose, Bld: 101 mg/dL — ABNORMAL HIGH (ref 70–99)
Potassium: 4 mmol/L (ref 3.5–5.1)
Sodium: 138 mmol/L (ref 135–145)
Total Bilirubin: 0.6 mg/dL (ref 0.3–1.2)
Total Protein: 7 g/dL (ref 6.5–8.1)

## 2021-07-24 LAB — C DIFFICILE QUICK SCREEN W PCR REFLEX
C Diff antigen: NEGATIVE
C Diff interpretation: NOT DETECTED
C Diff toxin: NEGATIVE

## 2021-07-24 MED ORDER — APIXABAN 2.5 MG PO TABS: 2.5000 mg | ORAL_TABLET | Freq: Two times a day (BID) | ORAL | 5 refills | Status: DC

## 2021-07-24 MED ORDER — ACYCLOVIR 400 MG PO TABS: 400.0000 mg | ORAL_TABLET | Freq: Two times a day (BID) | ORAL | 1 refills | Status: DC

## 2021-07-24 MED ORDER — SACCHAROMYCES BOULARDII 500 MG PO PACK
500.0000 mg | PACK | Freq: Two times a day (BID) | ORAL | 0 refills | Status: DC
Start: 2021-07-24 — End: 2022-10-23

## 2021-07-24 NOTE — Progress Notes (Signed)
? ? ?HEMATOLOGY/ONCOLOGY CLINIC NOTE ? ?Date of Service: 07/24/2021 ? ? ?Patient Care Team: ?[name], MD as PCP - General (Family Medicine) ?Arnoldo Lenis, MD as PCP - Cardiology (Cardiology) ?[name], FNP as PCP - Family Medicine (Family Medicine) ?Rourk, [name], MD as Consulting Physician (Gastroenterology) ? ?CHIEF COMPLAINTS/PURPOSE OF CONSULTATION:  ?Continued follow-up for evaluation and management of multiple myeloma ? ?HISTORY OF PRESENTING ILLNESS:  ?Please see previous note for details on initial presentation ? ?INTERVAL HISTORY ? ?Megan Velasquez is here for continued evaluation and management of her multiple myeloma.  She is accompanied by her husband for this visit. She reports she is doing well. ? ?She reports that she has been having some diarrhea 3 or 4 times in the morning. She notes they are loose stools. She notes some prior abdominal cramping before diahrrea. She endorses taking 1x 2 mg Imodium tablet after diarrhea and says its causes her to not use the restroom for the rest of the day. We discussed that this likely due to maintenance Revlimid. She notes that takes the Revlimid before she goes to bed. She reports that she took Z-Pak and we discussed that taking an antibiotic can cause an antibiotic related infection so it is recommended that she provide a stool sample for testing which the pt was agreeable. We discussed starting probiotic Saccharomyces Boulardii. ? ?She expresses concern over higher BP.  ? ?She reports she is eating and drinking well. ? ?No other significant toxicities from her current dose of maintenance Revlimid at this time. ? ?No fever, chills, night sweats. ?No new lumps, bumps, or lesions/rashes. ?No abdominal pain or change in bowel habits. ?No new or unexpected weight loss. ?No SOB or chest pain. ?No other new or acute focal symptoms. ? ?Labs from 07/17/2021 reviewed with the patient.  ?CBC WNL. ?CMP potassium normal at 3.5. Elevated bld glucose at 130  mg/dL. Mildly elevated creatinine 1.03.  Calcium 9.0. ? ?MEDICAL HISTORY:  ?Past Medical History:  ?Diagnosis Date  ? Cancer [facility])   ? GERD (gastroesophageal reflux disease)   ? History of kidney stones   ? HTN (hypertension)   ? Renal disorder   ? ? ?SURGICAL HISTORY: ?Past Surgical History:  ?Procedure Laterality Date  ? CHOLECYSTECTOMY    ? COLONOSCOPY  01/2015  ? Dr. Britta Mccreedy: Mild diverticulosis, sessile polyp ranging 3 to 5 mm removed from the proximal transverse colon, semi-pedunculated polyp 5 to 9 mm in size removed from the sigmoid colon.  Sigmoid colon polyp was serrated adenoma, transverse colon polyp was adenomatous.  Patient was told to have another colonoscopy in 3 years.  ? COLONOSCOPY WITH PROPOFOL N/A 10/11/2017  ? Procedure: COLONOSCOPY WITH PROPOFOL;  Surgeon: Daneil Dolin, MD;  [facility];  Service: Endoscopy;  Laterality: N/A;  1:15pm  ? IR IMAGING GUIDED PORT INSERTION  06/05/2020  ? POLYPECTOMY  10/11/2017  ? Procedure: POLYPECTOMY;  Surgeon: Daneil Dolin, MD;  [facility];  Service: Endoscopy;;  descending colon polyps cs times 2  ? ? ?SOCIAL HISTORY: ?Social History  ? ?Socioeconomic History  ? Marital status: Married  ?  Spouse name: Not on file  ? Number of children: Not on file  ? Years of education: Not on file  ? Highest education level: Not on file  ?Occupational History  ? Not on file  ?Tobacco Use  ? Smoking status: Former  ?  Packs/day: 1.00  ?  Years: 28.00  ?  Pack  years: 28.00  ?  Types: Cigarettes  ?  Quit date: 10/08/2013  ?  Years since quitting: 7.7  ? Smokeless tobacco: Never  ?Vaping Use  ? Vaping Use: Never used  ?Substance and Sexual Activity  ? Alcohol use: Not Currently  ? Drug use: Never  ? Sexual activity: Yes  ?  Birth control/protection: Post-menopausal  ?Other Topics Concern  ? Not on file  ?Social History Narrative  ? Not on file  ? ?Social Determinants of Health  ? ?Financial Resource Strain: Not on file  ?Food Insecurity: Not on file   ?Transportation Needs: Not on file  ?Physical Activity: Not on file  ?Stress: Not on file  ?Social Connections: Not on file  ?Intimate Partner Violence: Not on file  ? ? ?FAMILY HISTORY: ?Family History  ?Problem Relation Age of Onset  ? Heart disease Mother   ? Diabetes Mother   ? Lung cancer Father   ? COPD Father   ? Colon cancer Neg Hx   ? ? ?ALLERGIES:  has No Known Allergies. ? ?MEDICATIONS:  ?Current Outpatient Medications  ?Medication Sig Dispense Refill  ? acetaminophen (TYLENOL) 500 MG tablet Take 1,000 mg by mouth every 6 (six) hours as needed for fever or headache.    ? acyclovir (ZOVIRAX) 400 MG tablet TAKE 1 TABLET BY MOUTH TWICE A DAY 30 tablet 1  ? ADVAIR HFA 230-21 MCG/ACT inhaler Inhale 2 puffs into the lungs 2 (two) times daily.    ? Ascorbic Acid (VITAMIN C) 1000 MG tablet Take 1,000 mg by mouth every morning.    ? b complex vitamins capsule Take 1 capsule by mouth daily. (Patient taking differently: Take 1 capsule by mouth every morning.) 30 capsule 3  ? chlorhexidine (PERIDEX) 0.12 % solution SMARTSIG:By Mouth (Patient not taking: Reported on 05/29/2021)    ? Cholecalciferol (VITAMIN D3) 5000 units CAPS Take 5,000 Units by mouth every morning.    ? Cyanocobalamin (VITAMIN B12) 3000 MCG SUBL Place 3,000 mcg under the tongue every morning.    ? dexamethasone (DECADRON) 4 MG tablet TAKE 5 TABLETS BY MOUTH ONCE A WEEK. (Patient not taking: Reported on 05/29/2021) 40 tablet 2  ? ELIQUIS 2.5 MG TABS tablet TAKE 1 TABLET BY MOUTH TWICE A DAY 60 tablet 5  ? KLOR-CON M20 20 MEQ tablet TAKE 1 TABLET BY MOUTH TWICE A DAY (Patient not taking: Reported on 07/14/2021) 180 tablet 1  ? lidocaine-prilocaine (EMLA) cream Apply to affected area once (Patient not taking: Reported on 07/14/2021) 30 g 3  ? loratadine (CLARITIN) 10 MG tablet Take 10 mg by mouth every morning.    ? LORazepam (ATIVAN) 0.5 MG tablet Take 1 tablet (0.5 mg total) by mouth every 6 (six) hours as needed (Nausea or vomiting). (Patient not  taking: Reported on 07/14/2021) 30 tablet 0  ? omeprazole (PRILOSEC) 40 MG capsule Take 40 mg by mouth daily.    ? ondansetron (ZOFRAN) 8 MG tablet Take 1 tablet (8 mg total) by mouth 2 (two) times daily as needed (Nausea or vomiting). 30 tablet 1  ? prochlorperazine (COMPAZINE) 10 MG tablet Take 1 tablet (10 mg total) by mouth every 6 (six) hours as needed (Nausea or vomiting). (Patient not taking: Reported on 07/14/2021) 30 tablet 1  ? REVLIMID 10 MG capsule Take 1 capsule (10 mg total) by mouth daily. Take 1 capsule (10 mg) by mouth daily. Take for 21 days, then hold for 7 days. Repeat every 28 days. 21 capsule 0  ? ?No  current facility-administered medications for this visit.  ? ? ?REVIEW OF SYSTEMS:   ?10 Point review of Systems was done is negative except as noted above. ? ? ?PHYSICAL EXAMINATION: ?ECOG PERFORMANCE STATUS: 1 - Symptomatic but completely ambulatory ?.BP 111/68 (BP Location: Right Arm, Patient Position: Sitting)   Pulse 72   Temp 98.4 ?F (36.9 ?C) (Oral)   Resp 18   Ht 5' 5"  (1.651 m)   Wt 133 lb 8 oz (60.6 kg)   SpO2 99%   BMI 22.22 kg/m?  ?NAD ?GENERAL:alert, in no acute distress and comfortable ?SKIN: no acute rashes, no significant lesions ?EYES: conjunctiva are pink and non-injected, sclera anicteric ?OROPHARYNX: MMM, no exudates, no oropharyngeal erythema or ulceration ?NECK: supple, no JVD ?LYMPH:  no palpable lymphadenopathy in the cervical, axillary or inguinal regions ?LUNGS: clear to auscultation b/l with normal respiratory effort ?HEART: regular rate & rhythm ?ABDOMEN:  normoactive bowel sounds , non tender, not distended. ?Extremity: no pedal edema ?PSYCH: alert & oriented x 3 with fluent speech ?NEURO: no focal motor/sensory deficits ? ? ? ?LABORATORY DATA:  ?I have reviewed the data as listed ? ?. ? ?  Latest Ref Rng & Units 07/17/2021  ? 10:17 AM 05/22/2021  ?  9:28 AM 04/03/2021  ? 10:26 AM  ?CBC  ?WBC 4.0 - 10.5 K/uL 4.8   4.3   7.1    ?Hemoglobin 12.0 - 15.0 g/dL 13.5    13.1   13.6    ?Hematocrit 36.0 - 46.0 % 39.2   37.9   40.8    ?Platelets 150 - 400 K/uL 156   146   152    ? ? ?. ? ?  Latest Ref Rng & Units 07/17/2021  ? 10:17 AM 05/22/2021  ?  9:28 AM 04/03/2021  ? 10:26 AM  ?CMP  ?Glu

## 2021-07-25 LAB — GASTROINTESTINAL PANEL BY PCR, STOOL (REPLACES STOOL CULTURE)

## 2021-07-25 LAB — KAPPA/LAMBDA LIGHT CHAINS
Kappa free light chain: 12 mg/L (ref 3.3–19.4)
Kappa, lambda light chain ratio: 1.08 (ref 0.26–1.65)
Lambda free light chains: 11.1 mg/L (ref 5.7–26.3)

## 2021-07-26 ENCOUNTER — Encounter: Payer: Self-pay | Admitting: Hematology

## 2021-07-28 ENCOUNTER — Telehealth: Payer: Self-pay | Admitting: Hematology

## 2021-07-28 LAB — MULTIPLE MYELOMA PANEL, SERUM
Albumin SerPl Elph-Mcnc: 3.9 g/dL (ref 2.9–4.4)
Albumin/Glob SerPl: 1.6 (ref 0.7–1.7)
Alpha 1: 0.2 g/dL (ref 0.0–0.4)
Alpha2 Glob SerPl Elph-Mcnc: 0.8 g/dL (ref 0.4–1.0)
B-Globulin SerPl Elph-Mcnc: 1 g/dL (ref 0.7–1.3)
Gamma Glob SerPl Elph-Mcnc: 0.5 g/dL (ref 0.4–1.8)
Globulin, Total: 2.5 g/dL (ref 2.2–3.9)
IgA: 77 mg/dL — ABNORMAL LOW (ref 87–352)
IgG (Immunoglobin G), Serum: 596 mg/dL (ref 586–1602)
IgM (Immunoglobulin M), Srm: 33 mg/dL (ref 26–217)
Total Protein ELP: 6.4 g/dL (ref 6.0–8.5)

## 2021-07-28 NOTE — Telephone Encounter (Signed)
Per 4/24 in basket called pt and left a message about appointment.  Left call back number if changes are needed ?

## 2021-07-31 ENCOUNTER — Other Ambulatory Visit: Payer: Self-pay | Admitting: Hematology

## 2021-07-31 DIAGNOSIS — C9000 Multiple myeloma not having achieved remission: Secondary | ICD-10-CM

## 2021-07-31 DIAGNOSIS — Z7189 Other specified counseling: Secondary | ICD-10-CM

## 2021-08-11 ENCOUNTER — Other Ambulatory Visit: Payer: Self-pay

## 2021-08-11 DIAGNOSIS — C9000 Multiple myeloma not having achieved remission: Secondary | ICD-10-CM

## 2021-08-11 MED ORDER — REVLIMID 10 MG PO CAPS: 10.0000 mg | ORAL_CAPSULE | Freq: Every day | ORAL | 0 refills | Status: DC

## 2021-08-14 ENCOUNTER — Ambulatory Visit (INDEPENDENT_AMBULATORY_CARE_PROVIDER_SITE_OTHER): Payer: BC Managed Care – PPO

## 2021-08-14 DIAGNOSIS — M79606 Pain in leg, unspecified: Secondary | ICD-10-CM

## 2021-08-14 DIAGNOSIS — M79604 Pain in right leg: Secondary | ICD-10-CM | POA: Diagnosis not present

## 2021-08-15 ENCOUNTER — Telehealth: Payer: Self-pay | Admitting: Cardiology

## 2021-08-15 NOTE — Telephone Encounter (Signed)
Addressed in results encounter. 

## 2021-08-15 NOTE — Telephone Encounter (Signed)
Just resulted  J Eira Alpert MD 

## 2021-08-15 NOTE — Telephone Encounter (Signed)
Patient called requesting the results of ultrasound. ?

## 2021-08-15 NOTE — Telephone Encounter (Signed)
Follow Up: ? ?Patient is calling to see her Ultrasound results are ready? ?

## 2021-08-19 NOTE — Telephone Encounter (Signed)
Patient made aware.

## 2021-08-20 ENCOUNTER — Telehealth: Payer: Self-pay | Admitting: Cardiology

## 2021-08-20 DIAGNOSIS — M79606 Pain in leg, unspecified: Secondary | ICD-10-CM

## 2021-08-20 NOTE — Telephone Encounter (Signed)
Patient called stating Dr. [name] was going to refer her to a vascular doctor.  I did not see a referral for that in Epic.   ?

## 2021-08-21 NOTE — Telephone Encounter (Signed)
Referal was mentioned in the result note but not clarified, please refer to vascular for leg pains  Zandra Abts MD

## 2021-08-24 ENCOUNTER — Other Ambulatory Visit: Payer: Self-pay | Admitting: Hematology

## 2021-08-24 DIAGNOSIS — Z7189 Other specified counseling: Secondary | ICD-10-CM

## 2021-08-24 DIAGNOSIS — C9000 Multiple myeloma not having achieved remission: Secondary | ICD-10-CM

## 2021-08-25 ENCOUNTER — Encounter: Payer: Self-pay | Admitting: Hematology

## 2021-08-26 ENCOUNTER — Other Ambulatory Visit: Payer: Self-pay

## 2021-08-26 DIAGNOSIS — C9001 Multiple myeloma in remission: Secondary | ICD-10-CM

## 2021-08-26 NOTE — Progress Notes (Unsigned)
Office Note     CC: Lower extremity pain with recent ABI demonstrating peripheral arterial disease Requesting Provider:  [name], MD  HPI: Megan Velasquez is a 61 y.o. ([birth year]) female presenting at the request of .[name], MD with a 2-1/83-month history of right lower extremity toe discoloration.    On presentation today, Megan Velasquez was accompanied by her husband.  She noted toe discoloration and beginning roughly 2 1/2 months ago.  This was accompanied by some pain, however this waxes and wanes.  Per Megan Velasquez, the pain improved with ambulation.  She denied symptoms of claudication, ischemic rest pain, tissue loss, but has noted a new area of ecchymosis on the right great toe.    Previous medication regimen includes Eliquis to combat DVT/PE risk from chronic multiple myeloma being treated with Revlimid.    Past Medical History:  Diagnosis Date   Cancer ([place])    GERD (gastroesophageal reflux disease)    History of kidney stones    HTN (hypertension)    Renal disorder     Past Surgical History:  Procedure Laterality Date   CHOLECYSTECTOMY     COLONOSCOPY  01/2015   Dr. Britta Mccreedy: Mild diverticulosis, sessile polyp ranging 3 to 5 mm removed from the proximal transverse colon, semi-pedunculated polyp 5 to 9 mm in size removed from the sigmoid colon.  Sigmoid colon polyp was serrated adenoma, transverse colon polyp was adenomatous.  Patient was told to have another colonoscopy in 3 years.   COLONOSCOPY WITH PROPOFOL N/A 10/11/2017   Procedure: COLONOSCOPY WITH PROPOFOL;  Surgeon: Daneil Dolin, MD;  Location: [facility];  Service: Endoscopy;  Laterality: N/A;  1:15pm   IR IMAGING GUIDED PORT INSERTION  06/05/2020   POLYPECTOMY  10/11/2017   Procedure: POLYPECTOMY;  Surgeon: Daneil Dolin, MD;  Location: [facility];  Service: Endoscopy;;  descending colon polyps cs times 2    Social History   Socioeconomic History   Marital status: Married    Spouse name: Not on file    Number of children: Not on file   Years of education: Not on file   Highest education level: Not on file  Occupational History   Not on file  Tobacco Use   Smoking status: Former    Packs/day: 1.00    Years: 28.00    Pack years: 28.00    Types: Cigarettes    Quit date: 10/08/2013    Years since quitting: 7.8   Smokeless tobacco: Never  Vaping Use   Vaping Use: Never used  Substance and Sexual Activity   Alcohol use: Not Currently   Drug use: Never   Sexual activity: Yes    Birth control/protection: Post-menopausal  Other Topics Concern   Not on file  Social History Narrative   Not on file   Social Determinants of Health   Financial Resource Strain: Not on file  Food Insecurity: Not on file  Transportation Needs: Not on file  Physical Activity: Not on file  Stress: Not on file  Social Connections: Not on file  Intimate Partner Violence: Not on file   Family History  Problem Relation Age of Onset   Heart disease Mother    Diabetes Mother    Lung cancer Father    COPD Father    Colon cancer Neg Hx     Current Outpatient Medications  Medication Sig Dispense Refill   acetaminophen (TYLENOL) 500 MG tablet Take 1,000 mg by mouth every 6 (six) hours as needed for  fever or headache.     acyclovir (ZOVIRAX) 400 MG tablet TAKE 1 TABLET BY MOUTH TWICE A DAY 180 tablet 1   ADVAIR HFA 230-21 MCG/ACT inhaler Inhale 2 puffs into the lungs 2 (two) times daily.     apixaban (ELIQUIS) 2.5 MG TABS tablet Take 1 tablet (2.5 mg total) by mouth 2 (two) times daily. 60 tablet 5   Ascorbic Acid (VITAMIN C) 1000 MG tablet Take 1,000 mg by mouth every morning.     b complex vitamins capsule Take 1 capsule by mouth daily. (Patient taking differently: Take 1 capsule by mouth every morning.) 30 capsule 3   chlorhexidine (PERIDEX) 0.12 % solution SMARTSIG:By Mouth (Patient not taking: Reported on 05/29/2021)     Cholecalciferol (VITAMIN D3) 5000 units CAPS Take 5,000 Units by mouth every morning.      Cyanocobalamin (VITAMIN B12) 3000 MCG SUBL Place 3,000 mcg under the tongue every morning.     KLOR-CON M20 20 MEQ tablet TAKE 1 TABLET BY MOUTH TWICE A DAY (Patient not taking: Reported on 07/14/2021) 180 tablet 1   lidocaine-prilocaine (EMLA) cream Apply to affected area once (Patient not taking: Reported on 07/14/2021) 30 g 3   loratadine (CLARITIN) 10 MG tablet Take 10 mg by mouth every morning.     omeprazole (PRILOSEC) 40 MG capsule Take 40 mg by mouth daily.     ondansetron (ZOFRAN) 8 MG tablet Take 1 tablet (8 mg total) by mouth 2 (two) times daily as needed (Nausea or vomiting). 30 tablet 1   REVLIMID 10 MG capsule Take 1 capsule (10 mg total) by mouth daily. Take 1 capsule (10 mg) by mouth daily. Take for 21 days, then hold for 7 days. Repeat every 28 days. 21 capsule 0   Saccharomyces boulardii 500 MG PACK Take 500 mg by mouth 2 (two) times daily at 8 am and 10 pm. 20 each 0   No current facility-administered medications for this visit.    No Known Allergies   REVIEW OF SYSTEMS:  _0  denotes positive finding, _1  denotes negative finding Cardiac  Comments:  Chest pain or chest pressure:    Shortness of breath upon exertion:    Short of breath when lying flat:    Irregular heart rhythm:        Vascular    Pain in calf, thigh, or hip brought on by ambulation:    Pain in feet at night that wakes you up from your sleep:     Blood clot in your veins:    Leg swelling:         Pulmonary    Oxygen at home:    Productive cough:     Wheezing:         Neurologic    Sudden weakness in arms or legs:     Sudden numbness in arms or legs:     Sudden onset of difficulty speaking or slurred speech:    Temporary loss of vision in one eye:     Problems with dizziness:         Gastrointestinal    Blood in stool:     Vomited blood:         Genitourinary    Burning when urinating:     Blood in urine:        Psychiatric    Major depression:         Hematologic    Bleeding  problems:    Problems with blood clotting too easily:  Skin    Rashes or ulcers:        Constitutional    Fever or chills:      PHYSICAL EXAMINATION:  There were no vitals filed for this visit.  General:  WDWN in NAD; vital signs documented above Gait: Not observed HENT: WNL, normocephalic Pulmonary: normal non-labored breathing , without wheezing Cardiac: regular HR Abdomen: soft, NT, no masses Skin: without rashes Vascular Exam/Pulses:  Right Left  Radial 2+ (normal) 2+ (normal)  Ulnar    Femoral absent 2+ (normal)  Popliteal    DP absent 2+ (normal)  PT absent 2+ (normal)   Extremities: without ischemic changes, without Gangrene , without cellulitis; without open wounds;  Purple/hyperemic discoloration with excellent capillary refill in the toes   Musculoskeletal: no muscle wasting or atrophy  Neurologic: A&O X 3;  No focal weakness or paresthesias are detected Psychiatric:  The pt has Normal affect.   Non-Invasive Vascular Imaging:   ABI Findings:  +---------+------------------+-----+---------+-----------------------------  ----+  Right    Rt Pressure (mmHg)IndexWaveform Comment                              +---------+------------------+-----+---------+-----------------------------  ----+  Brachial 138                    triphasic                                    +---------+------------------+-----+---------+-----------------------------  ----+  PTA      75                0.54 biphasic                                     +---------+------------------+-----+---------+-----------------------------  ----+  DP       72                0.52 biphasic                                     +---------+------------------+-----+---------+-----------------------------  ----+  Great Toe                       Absent   Clear waveform not  visualized for                                           pressure measurement.  Attempted                                              PPG on multiple toes.                +---------+------------------+-----+---------+-----------------------------  ----+   +---------+------------------+-----+---------+-------+  Left     Lt Pressure (mmHg)IndexWaveform Comment  +---------+------------------+-----+---------+-------+  Brachial 139                    triphasic         +---------+------------------+-----+---------+-------+  PTA  114               0.82 biphasic          +---------+------------------+-----+---------+-------+  DP       142               1.02 triphasic         +---------+------------------+-----+---------+-------+  Great Toe88                0.63 Abnormal          +---------+------------------+-----+---------+-------+   +-------+-----------+-----------+------------+------------+  ABI/TBIToday's ABIToday's TBIPrevious ABIPrevious TBI  +-------+-----------+-----------+------------+------------+  Right  0.56                                            +-------+-----------+-----------+------------+------------+  Left   1.02       0.63                                 +-------+-----------+-----------+------------+------------+      ASSESSMENT/PLAN: Ziyana Morikawa is a 61 y.o. female presenting with 70-monthhistory of right-sided toe discoloration.  This is associated with waxing and waning pain.  She denies normal symptoms of claudication, ischemic rest pain, but does note an area of ecchymosis on the right great toe.  ABIs were reviewed demonstrating moderate peripheral arterial disease in the right lower extremity.  On physical exam, VJocelyn Lamerhad nonpalpable femoral, and pedal pulses in the right leg.  VJocelyn Lamerhas an atypical presentation of peripheral arterial disease.  She is currently on Eliquis and is being treated for multiple myeloma with daily Revlimid.  I am unsure as to the side effect profile of this  medication.  The right-sided coloration in the toes do not have typical embolic features, however there is no toe pressure appreciated on TBI.   With a nonpalpable pulse in the right groin, I have ordered a CT angio abdomen pelvis with runoff in an effort to define possible areas of stenosis versus occlusion.  In the meantime, I asked that she continue her current medication regimen.  Differential diagnosis at this time includes thrombolic event, blue toe syndrome, chilblains disease  I will call her regarding the results to formulate a plan moving forward which may include diagnostic angiography with possible intervention.   JBroadus John MD [facility] [phone]

## 2021-08-27 ENCOUNTER — Inpatient Hospital Stay: Payer: BC Managed Care – PPO | Attending: Hematology | Admitting: Hematology

## 2021-08-27 ENCOUNTER — Other Ambulatory Visit: Payer: Self-pay

## 2021-08-27 ENCOUNTER — Inpatient Hospital Stay: Payer: BC Managed Care – PPO

## 2021-08-27 DIAGNOSIS — Z87891 Personal history of nicotine dependence: Secondary | ICD-10-CM | POA: Diagnosis not present

## 2021-08-27 DIAGNOSIS — Z7189 Other specified counseling: Secondary | ICD-10-CM

## 2021-08-27 DIAGNOSIS — Z95828 Presence of other vascular implants and grafts: Secondary | ICD-10-CM

## 2021-08-27 DIAGNOSIS — C9000 Multiple myeloma not having achieved remission: Secondary | ICD-10-CM | POA: Insufficient documentation

## 2021-08-27 DIAGNOSIS — C9001 Multiple myeloma in remission: Secondary | ICD-10-CM

## 2021-08-27 DIAGNOSIS — R197 Diarrhea, unspecified: Secondary | ICD-10-CM | POA: Insufficient documentation

## 2021-08-27 LAB — CBC WITH DIFFERENTIAL (CANCER CENTER ONLY)
Abs Immature Granulocytes: 0.02 10*3/uL (ref 0.00–0.07)
Basophils Absolute: 0.1 10*3/uL (ref 0.0–0.1)
Basophils Relative: 2 %
Eosinophils Absolute: 0.2 10*3/uL (ref 0.0–0.5)
Eosinophils Relative: 6 %
HCT: 39 % (ref 36.0–46.0)
Hemoglobin: 13.5 g/dL (ref 12.0–15.0)
Immature Granulocytes: 1 %
Lymphocytes Relative: 29 %
Lymphs Abs: 1 10*3/uL (ref 0.7–4.0)
MCH: 31.3 pg (ref 26.0–34.0)
MCHC: 34.6 g/dL (ref 30.0–36.0)
MCV: 90.5 fL (ref 80.0–100.0)
Monocytes Absolute: 0.4 10*3/uL (ref 0.1–1.0)
Monocytes Relative: 10 %
Neutro Abs: 1.9 10*3/uL (ref 1.7–7.7)
Neutrophils Relative %: 52 %
Platelet Count: 149 10*3/uL — ABNORMAL LOW (ref 150–400)
RBC: 4.31 MIL/uL (ref 3.87–5.11)
RDW: 14.4 % (ref 11.5–15.5)
WBC Count: 3.5 10*3/uL — ABNORMAL LOW (ref 4.0–10.5)
nRBC: 0 % (ref 0.0–0.2)

## 2021-08-27 LAB — CMP (CANCER CENTER ONLY)
ALT: 14 U/L (ref 0–44)
AST: 16 U/L (ref 15–41)
Albumin: 4.2 g/dL (ref 3.5–5.0)
Alkaline Phosphatase: 83 U/L (ref 38–126)
Anion gap: 7 (ref 5–15)
BUN: 19 mg/dL (ref 8–23)
CO2: 27 mmol/L (ref 22–32)
Calcium: 8.9 mg/dL (ref 8.9–10.3)
Chloride: 103 mmol/L (ref 98–111)
Creatinine: 0.96 mg/dL (ref 0.44–1.00)
GFR, Estimated: >60 mL/min (ref >60)
Glucose, Bld: 123 mg/dL — ABNORMAL HIGH (ref 70–99)
Potassium: 3.7 mmol/L (ref 3.5–5.1)
Sodium: 137 mmol/L (ref 135–145)
Total Bilirubin: 0.8 mg/dL (ref 0.3–1.2)
Total Protein: 6.5 g/dL (ref 6.5–8.1)

## 2021-08-27 MED ORDER — SODIUM CHLORIDE 0.9% FLUSH
10.0000 mL | Freq: Once | INTRAVENOUS | Status: AC
  Administered 2021-08-27: 10 mL

## 2021-08-27 MED ORDER — HEPARIN SOD (PORK) LOCK FLUSH 100 UNIT/ML IV SOLN
500.0000 [IU] | Freq: Once | INTRAVENOUS | Status: AC
  Administered 2021-08-27: 500 [IU]

## 2021-08-27 MED ORDER — ONDANSETRON HCL 8 MG PO TABS: 8.0000 mg | ORAL_TABLET | Freq: Two times a day (BID) | ORAL | 1 refills | Status: DC | PRN

## 2021-08-27 NOTE — Progress Notes (Signed)
HEMATOLOGY/ONCOLOGY CLINIC NOTE  Date of Service: 08/27/2021   Patient Care Team: [name], MD as PCP - General (Family Medicine) [name], [name], MD as PCP - Cardiology (Cardiology) [name], FNP as PCP - Family Medicine (Family Medicine) [name], [name], MD as Consulting Physician (Gastroenterology)  CHIEF COMPLAINTS/PURPOSE OF CONSULTATION:  Continued follow-up for evaluation and management of multiple myeloma  HISTORY OF PRESENTING ILLNESS:  Please see previous note for details on initial presentation  INTERVAL HISTORY  Megan Velasquez is a 61 y.o. female here for continued evaluation and management of her multiple myeloma.  She is accompanied by her husband for this visit. She reports she is doing well.  She notes her diarrhea has improved. She reports that her bowel movements have been more solid within the last week. No indication to reduce current Revlimid dosage at this time.  She reports some rhinorrhea and a burning sensation in her nose. We discussed the burning could be from wiping.  She reports some discoloration in her right toe and notes she was told that it related to poor circulation. She notes that there is not much pain and says that is numb. She has an appointment with vascular surgeon 08/28/2021.   She reports pain in her left posterior thigh and notes that the pain comes in episodes that last 5-6 minutes. She says the pain is not alleviated by walking or other movement. remains We discussed that the pain could be related to the Revlimid. We further discussed that she should maintain electrolyte levels and stay hydrated by drinking 48-64 oz of water per day.  She reports that the Zofran helps with occasional nausea.  Recommended to stop acyclovir since it has been 6 months post carfilzomib.  We discussed speaking with PCP about changing Omeprazole to Pepcid if tolerated.  She reports she is eating and drinking well.  No other significant  toxicities from her current dose of maintenance Revlimid at this time.  No fever, chills, night sweats. No new lumps, bumps, or lesions/rashes. No abdominal pain or change in bowel habits. No new or unexpected weight loss. No SOB or chest pain. No other new or acute focal symptoms.  Labs from today 08/27/2021 reviewed with the patient.  CBC WNL with hemoglobin of 13.5, decreased WBC count of 3.5k platelets of 149k. CMP potassium normal at 3.7. Elevated bld glucose at 123 mg/dL. Creatinine 0.96.  Calcium 8.9. Myeloma panel from today shows no monoclonal protein spike.  IFE unremarkable.  Kappa lambda free light chains within normal limits with normal ratio.   MEDICAL HISTORY:  Past Medical History:  Diagnosis Date   Cancer (Plain)    GERD (gastroesophageal reflux disease)    History of kidney stones    HTN (hypertension)    Renal disorder     SURGICAL HISTORY: Past Surgical History:  Procedure Laterality Date   CHOLECYSTECTOMY     COLONOSCOPY  01/2015   Dr. Britta Mccreedy: Mild diverticulosis, sessile polyp ranging 3 to 5 mm removed from the proximal transverse colon, semi-pedunculated polyp 5 to 9 mm in size removed from the sigmoid colon.  Sigmoid colon polyp was serrated adenoma, transverse colon polyp was adenomatous.  Patient was told to have another colonoscopy in 3 years.   COLONOSCOPY WITH PROPOFOL N/A 10/11/2017   Procedure: COLONOSCOPY WITH PROPOFOL;  Surgeon: Daneil Dolin, MD;  Location: [facility];  Service: Endoscopy;  Laterality: N/A;  1:15pm   IR IMAGING GUIDED PORT INSERTION  06/05/2020  POLYPECTOMY  10/11/2017   Procedure: POLYPECTOMY;  Surgeon: Daneil Dolin, MD;  Location: [facility];  Service: Endoscopy;;  descending colon polyps cs times 2    SOCIAL HISTORY: Social History   Socioeconomic History   Marital status: Married    Spouse name: Not on file   Number of children: Not on file   Years of education: Not on file   Highest education level: Not on file   Occupational History   Not on file  Tobacco Use   Smoking status: Former    Packs/day: 1.00    Years: 28.00    Pack years: 28.00    Types: Cigarettes    Quit date: 10/08/2013    Years since quitting: 7.8   Smokeless tobacco: Never  Vaping Use   Vaping Use: Never used  Substance and Sexual Activity   Alcohol use: Not Currently   Drug use: Never   Sexual activity: Yes    Birth control/protection: Post-menopausal  Other Topics Concern   Not on file  Social History Narrative   Not on file   Social Determinants of Health   Financial Resource Strain: Not on file  Food Insecurity: Not on file  Transportation Needs: Not on file  Physical Activity: Not on file  Stress: Not on file  Social Connections: Not on file  Intimate Partner Violence: Not on file    FAMILY HISTORY: Family History  Problem Relation Age of Onset   Heart disease Mother    Diabetes Mother    Lung cancer Father    COPD Father    Colon cancer Neg Hx     ALLERGIES:  has No Known Allergies.  MEDICATIONS:  Current Outpatient Medications  Medication Sig Dispense Refill   acetaminophen (TYLENOL) 500 MG tablet Take 1,000 mg by mouth every 6 (six) hours as needed for fever or headache.     acyclovir (ZOVIRAX) 400 MG tablet TAKE 1 TABLET BY MOUTH TWICE A DAY 180 tablet 1   ADVAIR HFA 230-21 MCG/ACT inhaler Inhale 2 puffs into the lungs 2 (two) times daily.     apixaban (ELIQUIS) 2.5 MG TABS tablet Take 1 tablet (2.5 mg total) by mouth 2 (two) times daily. 60 tablet 5   Ascorbic Acid (VITAMIN C) 1000 MG tablet Take 1,000 mg by mouth every morning.     b complex vitamins capsule Take 1 capsule by mouth daily. (Patient taking differently: Take 1 capsule by mouth every morning.) 30 capsule 3   chlorhexidine (PERIDEX) 0.12 % solution SMARTSIG:By Mouth (Patient not taking: Reported on 05/29/2021)     Cholecalciferol (VITAMIN D3) 5000 units CAPS Take 5,000 Units by mouth every morning.     Cyanocobalamin (VITAMIN B12)  3000 MCG SUBL Place 3,000 mcg under the tongue every morning.     KLOR-CON M20 20 MEQ tablet TAKE 1 TABLET BY MOUTH TWICE A DAY (Patient not taking: Reported on 07/14/2021) 180 tablet 1   lidocaine-prilocaine (EMLA) cream Apply to affected area once (Patient not taking: Reported on 07/14/2021) 30 g 3   loratadine (CLARITIN) 10 MG tablet Take 10 mg by mouth every morning.     omeprazole (PRILOSEC) 40 MG capsule Take 40 mg by mouth daily.     ondansetron (ZOFRAN) 8 MG tablet Take 1 tablet (8 mg total) by mouth 2 (two) times daily as needed (Nausea or vomiting). 30 tablet 1   REVLIMID 10 MG capsule Take 1 capsule (10 mg total) by mouth daily. Take 1 capsule (10 mg) by  mouth daily. Take for 21 days, then hold for 7 days. Repeat every 28 days. 21 capsule 0   Saccharomyces boulardii 500 MG PACK Take 500 mg by mouth 2 (two) times daily at 8 am and 10 pm. 20 each 0   No current facility-administered medications for this visit.    REVIEW OF SYSTEMS:   10 Point review of Systems was done is negative except as noted above.   PHYSICAL EXAMINATION: ECOG PERFORMANCE STATUS: 1 - Symptomatic but completely ambulatory .BP 128/64   Pulse 69   Temp 97.7 F (36.5 C)   Resp 20   SpO2 100%  NAD GENERAL:alert, in no acute distress and comfortable SKIN: no acute rashes, no significant lesions EYES: conjunctiva are pink and non-injected, sclera anicteric NECK: supple, no JVD LYMPH:  no palpable lymphadenopathy in the cervical, axillary or inguinal regions LUNGS: clear to auscultation b/l with normal respiratory effort HEART: regular rate & rhythm ABDOMEN:  normoactive bowel sounds , non tender, not distended. Extremity: no pedal edema PSYCH: alert & oriented x 3 with fluent speech NEURO: no focal motor/sensory deficits  LABORATORY DATA:  I have reviewed the data as listed  .    Latest Ref Rng & Units 08/27/2021    8:54 AM 07/24/2021   10:50 AM 07/17/2021   10:17 AM  CBC  WBC 4.0 - 10.5 K/uL 3.5    5.1   4.8    Hemoglobin 12.0 - 15.0 g/dL 13.5   14.0   13.5    Hematocrit 36.0 - 46.0 % 39.0   41.2   39.2    Platelets 150 - 400 K/uL 149   196   156      .    Latest Ref Rng & Units 08/27/2021    8:54 AM 07/24/2021   10:50 AM 07/17/2021   10:17 AM  CMP  Glucose 70 - 99 mg/dL 123   101   130    BUN 8 - 23 mg/dL 19   23   22     Creatinine 0.44 - 1.00 mg/dL 0.96   0.96   1.03    Sodium 135 - 145 mmol/L 137   138   139    Potassium 3.5 - 5.1 mmol/L 3.7   4.0   3.5    Chloride 98 - 111 mmol/L 103   103   104    CO2 22 - 32 mmol/L 27   28   27     Calcium 8.9 - 10.3 mg/dL 8.9   9.4   9.0    Total Protein 6.5 - 8.1 g/dL 6.5   7.0   6.5    Total Bilirubin 0.3 - 1.2 mg/dL 0.8   0.6   0.7    Alkaline Phos 38 - 126 U/L 83   86   79    AST 15 - 41 U/L 16   16   17     ALT 0 - 44 U/L 14   14   16        Most recent lab results (03/15/2020) of CBC is as follows: all values are WNL except for RBC at 3.66, Hgb at 9.7, HCT at 31.8, MCHC at 30.5, RDW Standard Deviation 58.7, Red Cell Distribution Width At 18.6,  BUN at 19, Creatinine at 1.06, Total Protein at 10.2, Albumin at 2.7, AST at 14,. 03/13/2020 UPEP shows no M-Spike, all values are WNL 03/06/2020 SPEP shows Total Protein at 10.1, Beta Globulin at 5.2, M-Spike at 4.3, Total  Globulin at 6.4, A/G Ratio at 0.6 03/06/2020 Free Kappa Light Chains at 181.1, Free Lambda Light Chains Serum at 3.6, K/L ratio at 50.31 03/06/2020 Beta-2 Microglobulin at 2.5           RADIOGRAPHIC STUDIES: I have personally reviewed the radiological images as listed and agreed with the findings in the report. VAS [place] ABI WITH/WO TBI  Result Date: 08/15/2021  LOWER EXTREMITY DOPPLER STUDY Patient Name:  KALIOPE QUINONEZ  Date of Exam:   08/14/2021 Medical Rec #: [id number]     Accession #:    [id number] Date of Birth: 09/26/1960     Patient Gender: F Patient Age:   8 years Exam Location:  [place] Procedure:      VAS [place] ABI WITH/WO TBI Referring Phys: JONATHAN BRANCH  --------------------------------------------------------------------------------  Indications: Decreased pulses, discoloration on right toes. High Risk Factors: Hypertension, past history of smoking.  Performing Technologist: Caesar Chestnut RVT, RDCS  Examination Guidelines: A complete evaluation includes at minimum, Doppler waveform signals and systolic blood pressure reading at the level of bilateral brachial, anterior tibial, and posterior tibial arteries, when vessel segments are accessible. Bilateral testing is considered an integral part of a complete examination. Photoelectric Plethysmograph (PPG) waveforms and toe systolic pressure readings are included as required and additional duplex testing as needed. Limited examinations for reoccurring indications may be performed as noted.  ABI Findings: +---------+------------------+-----+---------+---------------------------------+ Right    Rt Pressure (mmHg)IndexWaveform Comment                           +---------+------------------+-----+---------+---------------------------------+ Brachial 138                    triphasic                                  +---------+------------------+-----+---------+---------------------------------+ PTA      75                0.54 biphasic                                   +---------+------------------+-----+---------+---------------------------------+ DP       72                0.52 biphasic                                   +---------+------------------+-----+---------+---------------------------------+ Great Toe                       Absent   Clear waveform not visualized for                                          pressure measurement. Attempted                                            PPG on multiple toes.             +---------+------------------+-----+---------+---------------------------------+ +---------+------------------+-----+---------+-------+ Left     Lt Pressure  (mmHg)IndexWaveform Comment +---------+------------------+-----+---------+-------+ Brachial 139  triphasic        +---------+------------------+-----+---------+-------+ PTA      114               0.82 biphasic         +---------+------------------+-----+---------+-------+ DP       142               1.02 triphasic        +---------+------------------+-----+---------+-------+ Great Toe88                0.63 Abnormal         +---------+------------------+-----+---------+-------+ +-------+-----------+-----------+------------+------------+ ABI/TBIToday's ABIToday's TBIPrevious ABIPrevious TBI +-------+-----------+-----------+------------+------------+ Right  0.56                                           +-------+-----------+-----------+------------+------------+ Left   1.02       0.63                                +-------+-----------+-----------+------------+------------+  Summary: Right: Resting right ankle-brachial index indicates moderate right lower extremity arterial disease. Absent waveform on PPG tracing on right toes. Left: Resting left ankle-brachial index is within normal range. No evidence of significant left lower extremity arterial disease. The left toe-brachial index is abnormal. *See table(s) above for measurements and observations.  Vascular consult recommended. Electronically signed by Kathlyn Sacramento MD on 08/15/2021 at 12:33:45 PM.    Final     ASSESSMENT & PLAN:   61 year old female here for continued follow-up, evaluation and management of multiple myeloma  #1  IgA kappa multiple myeloma status post induction treatment with carfilzomib Revlimid dexamethasone  presenting with anemia hemoglobin 9.4. Creatinine 1.28 No hypercalcemia Bone survey with no obvious skeletal lesions. Baseline M spike of 4.3 g/dL. Beta-2 Microglobulin at 2.5 Mol Cy Monosomy 13 and dup 1q  2) status post stem cell collection assisted with G-CSF and  Plerixafor. Currently on maintenance Revlimid. Currently had declined proceeding with consolidative autologous HSCT  3) Diarrhea - ? Related to Revlimid. GI panel and c diff neg30  PLAN: -Labs from today 08/27/2021 reviewed with the patient.  CBC WNL with hemoglobin of 13.5, decreased WBC count of 3.5k platelets of 149k. CMP potassium normal at 3.7. Elevated bld glucose at 123 mg/dL. Creatinine 0.96.  Calcium 8.9. Myeloma panel from today shows no monoclonal protein spike.  IFE unremarkable.  Kappa lambda free light chains within normal limits with normal ratio.  -Patient reports no significant toxicities from her current dose of maintenance Revlimid. We will continue maintenance Revlimid at 10 milligrams 3 weeks on 1 week off. -She will continue low-dose Eliquis 2.5 mg p.o. twice daily for thromboprophylaxis while on Revlimid. -Recommended to stop acyclovir since it has been 6 months post carfilzomib. -Continue vitamin B complex 1 capsule p.o. daily -Continue vitamin D 5000 units p.o. daily. -Start probiotic saccharomyces boulardii  -Follow-up with primary care physician for age and condition appropriate vaccination and ongoing cancer screening. -Stool testing ordered. -Continue taking 1x 2 mg Imodium tablet per day as needed for diarrhea.  -She notes her diarrhea has improved. No indication to reduce current Revlimid dosage at this time. -Was recommended 2-4 week trial of probiotics.  -We discussed speaking with PCP about changing Omeprazole to Pepcid if tolerated. -RTC in 2 months with labs.  FOLLOW UP: RTC with Dr Irene Limbo with labs  in 2 month  .The total time spent in the appointment was 30 minutes* .  All of the patient's questions were answered with apparent satisfaction. The patient knows to call the clinic with any problems, questions or concerns.   Sullivan Lone MD MS AAHIVMS [facility] Hematology/Oncology Physician [facility]  .*Total Encounter Time as defined  by the Centers for Medicare and Medicaid Services includes, in addition to the face-to-face time of a patient visit (documented in the note above) non-face-to-face time: obtaining and reviewing outside history, ordering and reviewing medications, tests or procedures, care coordination (communications with other health care professionals or caregivers) and documentation in the medical record.  I, Melene Muller, am acting as scribe for Dr. Sullivan Lone, MD.  .I have reviewed the above documentation for accuracy and completeness, and I agree with the above. Brunetta Genera MD

## 2021-08-28 ENCOUNTER — Ambulatory Visit: Payer: BC Managed Care – PPO | Admitting: Vascular Surgery

## 2021-08-28 ENCOUNTER — Telehealth: Payer: Self-pay | Admitting: Hematology

## 2021-08-28 ENCOUNTER — Encounter: Payer: Self-pay | Admitting: Vascular Surgery

## 2021-08-28 VITALS — BP 122/73 | HR 62 | Temp 97.9°F | Resp 20 | Ht 65.0 in | Wt 133.0 lb

## 2021-08-28 DIAGNOSIS — L819 Disorder of pigmentation, unspecified: Secondary | ICD-10-CM | POA: Diagnosis not present

## 2021-08-28 DIAGNOSIS — I739 Peripheral vascular disease, unspecified: Secondary | ICD-10-CM

## 2021-08-28 LAB — KAPPA/LAMBDA LIGHT CHAINS
Kappa free light chain: 12.2 mg/L (ref 3.3–19.4)
Kappa, lambda light chain ratio: 1 (ref 0.26–1.65)
Lambda free light chains: 12.2 mg/L (ref 5.7–26.3)

## 2021-08-28 NOTE — Telephone Encounter (Signed)
Scheduled per 5/24 los, pt has been called and confirmed  

## 2021-08-29 ENCOUNTER — Other Ambulatory Visit: Payer: Self-pay

## 2021-08-29 DIAGNOSIS — I739 Peripheral vascular disease, unspecified: Secondary | ICD-10-CM

## 2021-08-29 DIAGNOSIS — L819 Disorder of pigmentation, unspecified: Secondary | ICD-10-CM

## 2021-09-02 ENCOUNTER — Encounter: Payer: Self-pay | Admitting: Hematology

## 2021-09-02 LAB — MULTIPLE MYELOMA PANEL, SERUM
Albumin SerPl Elph-Mcnc: 3.7 g/dL (ref 2.9–4.4)
Albumin/Glob SerPl: 1.7 (ref 0.7–1.7)
Alpha 1: 0.1 g/dL (ref 0.0–0.4)
Alpha2 Glob SerPl Elph-Mcnc: 0.7 g/dL (ref 0.4–1.0)
B-Globulin SerPl Elph-Mcnc: 0.8 g/dL (ref 0.7–1.3)
Gamma Glob SerPl Elph-Mcnc: 0.5 g/dL (ref 0.4–1.8)
Globulin, Total: 2.2 g/dL (ref 2.2–3.9)
IgA: 85 mg/dL — ABNORMAL LOW (ref 87–352)
IgG (Immunoglobin G), Serum: 598 mg/dL (ref 586–1602)
IgM (Immunoglobulin M), Srm: 27 mg/dL (ref 26–217)
Total Protein ELP: 5.9 g/dL — ABNORMAL LOW (ref 6.0–8.5)

## 2021-09-03 ENCOUNTER — Other Ambulatory Visit: Payer: Self-pay | Admitting: Hematology

## 2021-09-03 DIAGNOSIS — Z7189 Other specified counseling: Secondary | ICD-10-CM

## 2021-09-03 DIAGNOSIS — C9000 Multiple myeloma not having achieved remission: Secondary | ICD-10-CM

## 2021-09-04 ENCOUNTER — Encounter: Payer: Self-pay | Admitting: Vascular Surgery

## 2021-09-04 ENCOUNTER — Encounter: Payer: Self-pay | Admitting: Hematology

## 2021-09-05 ENCOUNTER — Encounter: Payer: Self-pay | Admitting: Vascular Surgery

## 2021-09-05 ENCOUNTER — Ambulatory Visit: Payer: BC Managed Care – PPO | Admitting: Vascular Surgery

## 2021-09-05 DIAGNOSIS — L819 Disorder of pigmentation, unspecified: Secondary | ICD-10-CM

## 2021-09-06 NOTE — Progress Notes (Addendum)
I called Megan Velasquez to follow-up recent CT angio abdomen pelvis with runoff for differential diagnosis of blue toe syndrome, chilblains disease.  Unfortunately, I do not have access to the CT scan that was performed in [place].  My office will be working to coordinate its uploaded to PACS.  I told both Megan Velasquez and her husband Megan Velasquez that I appreciate their understanding regarding the above and that I will contact them immediately once the issue has been resolved.  Broadus John MD   __________________________________   09/11/21  I called Megan Velasquez and her husband status post CT angiogram abdomen pelvis with runoff. 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  There is some mild disease in the right lower extremity, nonflow limiting otherwise.  During our phone call, Megan Velasquez noted improvement in right lower extremity discoloration.  No wounds, no rest pain, no claudication.  I do not have a good reason as to why she has some waxing waning mottling in the toes.  Cap refill was normal at her last visit. Usually in the setting of atheroembolic event, or cardioembolic event, there is no appreciable cap refill at the area of mottling.    He does have peripheral arterial disease and a nidus being the terminal aorta.  She would benefit from high intensity statin medication in an effort to stabilize plaque.  Should she have another episode or mottling worsen, we would pursue bilateral lower extremity angiography and likely stent over the iliac lesion.  That she has improved, this can be managed with the use of medications at this time.  Her embolic events are best managed with anticoagulation, which Megan Velasquez is on.  She would however benefit from echocardiography as well as CT angio of the chest to ensure there are no other possible lesions that could be a nidus.  I plan to refer her to Dr. [name] cardiology, and will send this to Dr. Virgel Bouquet for further discussion regarding high  intensity statin therapy. Plan to see Megan Velasquez back in 3 months time with repeat ABI.   Broadus John MD

## 2021-09-10 ENCOUNTER — Other Ambulatory Visit: Payer: Self-pay | Admitting: Oncology

## 2021-09-11 ENCOUNTER — Other Ambulatory Visit: Payer: Self-pay

## 2021-09-11 DIAGNOSIS — C9000 Multiple myeloma not having achieved remission: Secondary | ICD-10-CM

## 2021-09-11 MED ORDER — REVLIMID 10 MG PO CAPS: ORAL_CAPSULE | ORAL | 0 refills | Status: DC

## 2021-09-12 ENCOUNTER — Ambulatory Visit: Payer: BC Managed Care – PPO | Admitting: Vascular Surgery

## 2021-10-02 ENCOUNTER — Ambulatory Visit: Payer: BC Managed Care – PPO | Admitting: Cardiology

## 2021-10-02 ENCOUNTER — Encounter: Payer: Self-pay | Admitting: Cardiology

## 2021-10-02 VITALS — BP 130/60 | HR 76 | Ht 65.0 in | Wt 127.0 lb

## 2021-10-02 DIAGNOSIS — I739 Peripheral vascular disease, unspecified: Secondary | ICD-10-CM | POA: Diagnosis not present

## 2021-10-02 DIAGNOSIS — I7090 Unspecified atherosclerosis: Secondary | ICD-10-CM

## 2021-10-02 NOTE — Patient Instructions (Signed)
Medication Instructions:  Your physician recommends that you continue on your current medications as directed. Please refer to the Current Medication list given to you today.   Labwork: None  Testing/Procedures: Your physician has requested that you have an echocardiogram. Echocardiography is a painless test that uses sound waves to create images of your heart. It provides your doctor with information about the size and shape of your heart and how well your heart's chambers and valves are working. This procedure takes approximately one hour. There are no restrictions for this procedure.   Follow-Up: Follow up with Dr. [name] in 6 months in the [place] office.   Any Other Special Instructions Will Be Listed Below (If Applicable).     If you need a refill on your cardiac medications before your next appointment, please call your pharmacy.

## 2021-10-02 NOTE — Progress Notes (Signed)
Clinical Summary Megan Velasquez is a 61 y.o.female seen today as a new patient for the following medical problems.      1.Leg pain/Discoloration/PAD - right foot great toe and 2 adacent.  - not painfull, dark bluish purple. No specific trigger - no foot sores - can have cramping calves. Often comes on at rest.  - former smoker x 40 years     - tingling in hand, all 5 fingers. No weakness.    -ongong evaluation by vascular - ABIs with moderate right sided disease - CTA showed aortic bifurcation disease -vascular has recommended echo, CTA to evaluate possible embolic source. Consider statin.  -started rosuvastatin about 66monthago, started by pcp. Started on low dose and plan to adjust       2.Multiple myeloma      Past Medical History:  Diagnosis Date   [place]    GERD (gastroesophageal reflux disease)    History of kidney stones    HTN (hypertension)    Multiple myeloma ([facility])    Renal disorder      No Known Allergies   Current Outpatient Medications  Medication Sig Dispense Refill   acetaminophen (TYLENOL) 500 MG tablet Take 1,000 mg by mouth every 6 (six) hours as needed for fever or headache.     acyclovir (ZOVIRAX) 400 MG tablet Take 1 tablet by mouth twice daily 30 tablet 0   ADVAIR HFA 230-21 MCG/ACT inhaler Inhale 2 puffs into the lungs 2 (two) times daily.     apixaban (ELIQUIS) 2.5 MG TABS tablet Take 1 tablet (2.5 mg total) by mouth 2 (two) times daily. 60 tablet 5   Ascorbic Acid (VITAMIN C) 1000 MG tablet Take 1,000 mg by mouth every morning.     b complex vitamins capsule Take 1 capsule by mouth daily. (Patient taking differently: Take 1 capsule by mouth every morning.) 30 capsule 3   Cholecalciferol (VITAMIN D3) 5000 units CAPS Take 5,000 Units by mouth every morning.     Cyanocobalamin (VITAMIN B12) 3000 MCG SUBL Place 3,000 mcg under the tongue every morning.     loratadine (CLARITIN) 10 MG tablet Take 10 mg by mouth every morning.      omeprazole (PRILOSEC) 40 MG capsule Take 40 mg by mouth daily.     ondansetron (ZOFRAN) 8 MG tablet Take 1 tablet (8 mg total) by mouth 2 (two) times daily as needed (Nausea or vomiting). 30 tablet 1   REVLIMID 10 MG capsule TAKE 1 CAPSULE BY MOUTH DAILY FOR 21 DAYS, THEN TAKE 7 DAYS OFF . REPEAT EVERY 28 DAYS 21 capsule 0   Saccharomyces boulardii 500 MG PACK Take 500 mg by mouth 2 (two) times daily at 8 am and 10 pm. 20 each 0   No current facility-administered medications for this visit.     Past Surgical History:  Procedure Laterality Date   CHOLECYSTECTOMY     COLONOSCOPY  01/2015   Dr. BBritta Mccreedy Mild diverticulosis, sessile polyp ranging 3 to 5 mm removed from the proximal transverse colon, semi-pedunculated polyp 5 to 9 mm in size removed from the sigmoid colon.  Sigmoid colon polyp was serrated adenoma, transverse colon polyp was adenomatous.  Patient was told to have another colonoscopy in 3 years.   COLONOSCOPY WITH PROPOFOL N/A 10/11/2017   Procedure: COLONOSCOPY WITH PROPOFOL;  Surgeon: RDaneil Dolin MD;  [facility];  Service: Endoscopy;  Laterality: N/A;  1:15pm   IR IMAGING GUIDED PORT INSERTION  06/05/2020  POLYPECTOMY  10/11/2017   Procedure: POLYPECTOMY;  Surgeon: Daneil Dolin, MD;  [facility];  Service: Endoscopy;;  descending colon polyps cs times 2     No Known Allergies    Family History  Problem Relation Age of Onset   Heart disease Mother    Diabetes Mother    Lung cancer Father    COPD Father    Colon cancer Neg Hx      Social History Ms. Solorzano reports that she quit smoking about 7 years ago. Her smoking use included cigarettes. She has a 28.00 pack-year smoking history. She has never used smokeless tobacco. Ms. Inda reports that she does not currently use alcohol.   Review of Systems CONSTITUTIONAL: No weight loss, fever, chills, weakness or fatigue.  HEENT: Eyes: No visual loss, blurred vision, double vision or yellow  sclerae.No hearing loss, sneezing, congestion, runny nose or sore throat.  SKIN: No rash or itching.  CARDIOVASCULAR: per hpi RESPIRATORY: No shortness of breath, cough or sputum.  GASTROINTESTINAL: No anorexia, nausea, vomiting or diarrhea. No abdominal pain or blood.  GENITOURINARY: No burning on urination, no polyuria NEUROLOGICAL: No headache, dizziness, syncope, paralysis, ataxia, numbness or tingling in the extremities. No change in bowel or bladder control.  MUSCULOSKELETAL: No muscle, back pain, joint pain or stiffness.  LYMPHATICS: No enlarged nodes. No history of splenectomy.  PSYCHIATRIC: No history of depression or anxiety.  ENDOCRINOLOGIC: No reports of sweating, cold or heat intolerance. No polyuria or polydipsia.  Marland Kitchen   Physical Examination Today's Vitals   10/02/21 0810  BP: 130/60  Pulse: 76  SpO2: 95%  Weight: 127 lb (57.6 kg)  Height: 5' 5"  (1.651 m)   Body mass index is 21.13 kg/m.  Gen: resting comfortably, no acute distress HEENT: no scleral icterus, pupils equal round and reactive, no palptable cervical adenopathy,  CV: RRR, no m/r/ gno jvd Resp: Clear to auscultation bilaterally GI: abdomen is soft, non-tender, non-distended, normal bowel sounds, no hepatosplenomegaly MSK: extremities are warm, no edema.  Skin: warm, no rash Neuro:  no focal deficits Psych: appropriate affect   Diagnostic Studies 08/2021 ABI Summary:  Right: Resting right ankle-brachial index indicates moderate right lower  extremity arterial disease.   Absent waveform on PPG tracing on right toes.  Left: Resting left ankle-brachial index is within normal range. No  evidence of significant left lower extremity arterial disease. The left  toe-brachial index is abnormal.     Assessment and Plan   1.Leg pain/discoloratinon/PAD - followed by vascular - has been started on statin by pcp, request labs from pcp. Not on ASA since on eliquis - will obtain echo to eval for possilbe  embolic source. Reach out to vascular about CTA chest given she had CT PE last year with just scattered atherosclerosis of the aorta.         Arnoldo Lenis, M.D.

## 2021-10-06 ENCOUNTER — Telehealth: Payer: Self-pay | Admitting: *Deleted

## 2021-10-06 NOTE — Telephone Encounter (Signed)
-----   Message from Arnoldo Lenis, MD sent at 10/06/2021 11:46 AM EDT ----- Please let patient know that I heard back from Dr Virl Cagey and he is ok with the results from the prior CT she had, does not require another CT scan at this time  Zandra Abts MD ----- Message ----- From: Broadus John, MD Sent: 10/02/2021   1:44 PM EDT To: Arnoldo Lenis, MD  Hey,   In evaluating her imaging, I think she may have had an atheroembolic event - She's on best therapy so I think we can hold on another CT  My cell is [phone] if I can help with anything   - Eli  ----- Message ----- From: Arnoldo Lenis, MD Sent: 10/02/2021   8:46 AM EDT To: Broadus John, MD  Dr Virl Cagey,  Mutual patient of ours, you had recommended statin, echo, CTA chest. . PCP has started on crestor recently, will f/u her lipid levels. Ordering echo to evaluate for possible embolic source. She had a CT PE last year with just scattered atherosclerosis of the thoracic aorta, would that be sufficient from your standpoint or do you prefer a dedicated CTA thoracic aorta study.   Zandra Abts MD

## 2021-10-06 NOTE — Telephone Encounter (Signed)
Left message to return call 

## 2021-10-06 NOTE — Telephone Encounter (Signed)
Patient notified and verbalized understanding. 

## 2021-10-09 ENCOUNTER — Other Ambulatory Visit: Payer: Self-pay

## 2021-10-09 DIAGNOSIS — C9000 Multiple myeloma not having achieved remission: Secondary | ICD-10-CM

## 2021-10-09 MED ORDER — REVLIMID 10 MG PO CAPS: ORAL_CAPSULE | ORAL | 0 refills | Status: DC

## 2021-10-10 ENCOUNTER — Ambulatory Visit: Payer: BC Managed Care – PPO

## 2021-10-10 DIAGNOSIS — I7090 Unspecified atherosclerosis: Secondary | ICD-10-CM

## 2021-10-10 LAB — ECHOCARDIOGRAM COMPLETE
Area-P 1/2: 3.12 cm2
Calc EF: 78.6 %
S' Lateral: 2.43 cm
Single Plane A2C EF: 77.5 %
Single Plane A4C EF: 80.3 %

## 2021-10-13 ENCOUNTER — Telehealth: Payer: Self-pay | Admitting: Cardiology

## 2021-10-13 NOTE — Telephone Encounter (Signed)
Echo results are on provider desktop awaiting his final review.

## 2021-10-13 NOTE — Telephone Encounter (Signed)
Patient returned call regarding results. 

## 2021-10-14 NOTE — Telephone Encounter (Signed)
Laurine Blazer, LPN  0/03/2240  1:46 PM EDT Back to Top    Notified, copy to pcp.    Arnoldo Lenis, MD  10/14/2021  4:15 PM EDT     Normal echo, no significant abnormal findings   JBranch MD

## 2021-10-28 ENCOUNTER — Inpatient Hospital Stay: Payer: BC Managed Care – PPO

## 2021-10-28 ENCOUNTER — Other Ambulatory Visit: Payer: Self-pay

## 2021-10-28 ENCOUNTER — Inpatient Hospital Stay: Payer: BC Managed Care – PPO | Attending: Hematology | Admitting: Hematology

## 2021-10-28 VITALS — BP 120/66 | HR 68 | Temp 97.5°F | Resp 18 | Wt 130.9 lb

## 2021-10-28 DIAGNOSIS — C9001 Multiple myeloma in remission: Secondary | ICD-10-CM | POA: Diagnosis not present

## 2021-10-28 DIAGNOSIS — Z87891 Personal history of nicotine dependence: Secondary | ICD-10-CM | POA: Diagnosis not present

## 2021-10-28 DIAGNOSIS — Z95828 Presence of other vascular implants and grafts: Secondary | ICD-10-CM

## 2021-10-28 DIAGNOSIS — C9000 Multiple myeloma not having achieved remission: Secondary | ICD-10-CM | POA: Diagnosis not present

## 2021-10-28 LAB — CMP (CANCER CENTER ONLY)
ALT: 13 U/L (ref 0–44)
AST: 15 U/L (ref 15–41)
Albumin: 4.2 g/dL (ref 3.5–5.0)
Alkaline Phosphatase: 82 U/L (ref 38–126)
Anion gap: 7 (ref 5–15)
BUN: 16 mg/dL (ref 8–23)
CO2: 27 mmol/L (ref 22–32)
Calcium: 8.7 mg/dL — ABNORMAL LOW (ref 8.9–10.3)
Chloride: 105 mmol/L (ref 98–111)
Creatinine: 0.95 mg/dL (ref 0.44–1.00)
GFR, Estimated: >60 mL/min (ref >60)
Glucose, Bld: 120 mg/dL — ABNORMAL HIGH (ref 70–99)
Potassium: 3.6 mmol/L (ref 3.5–5.1)
Sodium: 139 mmol/L (ref 135–145)
Total Bilirubin: 0.7 mg/dL (ref 0.3–1.2)
Total Protein: 6.2 g/dL — ABNORMAL LOW (ref 6.5–8.1)

## 2021-10-28 LAB — CBC WITH DIFFERENTIAL/PLATELET
Abs Immature Granulocytes: 0.02 10*3/uL (ref 0.00–0.07)
Basophils Absolute: 0.1 10*3/uL (ref 0.0–0.1)
Basophils Relative: 1 %
Eosinophils Absolute: 0.2 10*3/uL (ref 0.0–0.5)
Eosinophils Relative: 3 %
HCT: 37.3 % (ref 36.0–46.0)
Hemoglobin: 12.9 g/dL (ref 12.0–15.0)
Immature Granulocytes: 0 %
Lymphocytes Relative: 22 %
Lymphs Abs: 1.3 10*3/uL (ref 0.7–4.0)
MCH: 31.5 pg (ref 26.0–34.0)
MCHC: 34.6 g/dL (ref 30.0–36.0)
MCV: 91.2 fL (ref 80.0–100.0)
Monocytes Absolute: 0.6 10*3/uL (ref 0.1–1.0)
Monocytes Relative: 10 %
Neutro Abs: 3.6 10*3/uL (ref 1.7–7.7)
Neutrophils Relative %: 64 %
Platelets: 141 10*3/uL — ABNORMAL LOW (ref 150–400)
RBC: 4.09 MIL/uL (ref 3.87–5.11)
RDW: 14 % (ref 11.5–15.5)
WBC: 5.7 10*3/uL (ref 4.0–10.5)
nRBC: 0 % (ref 0.0–0.2)

## 2021-10-28 MED ORDER — HEPARIN SOD (PORK) LOCK FLUSH 100 UNIT/ML IV SOLN
500.0000 [IU] | Freq: Once | INTRAVENOUS | Status: AC
  Administered 2021-10-28: 500 [IU]

## 2021-10-28 MED ORDER — SODIUM CHLORIDE 0.9% FLUSH
10.0000 mL | Freq: Once | INTRAVENOUS | Status: AC
  Administered 2021-10-28: 10 mL

## 2021-10-29 LAB — KAPPA/LAMBDA LIGHT CHAINS
Kappa free light chain: 16.6 mg/L (ref 3.3–19.4)
Kappa, lambda light chain ratio: 1.14 (ref 0.26–1.65)
Lambda free light chains: 14.6 mg/L (ref 5.7–26.3)

## 2021-10-31 ENCOUNTER — Other Ambulatory Visit: Payer: Self-pay | Admitting: Hematology

## 2021-10-31 DIAGNOSIS — C9000 Multiple myeloma not having achieved remission: Secondary | ICD-10-CM

## 2021-11-03 LAB — MULTIPLE MYELOMA PANEL, SERUM
Albumin SerPl Elph-Mcnc: 3.6 g/dL (ref 2.9–4.4)
Albumin/Glob SerPl: 1.6 (ref 0.7–1.7)
Alpha 1: 0.2 g/dL (ref 0.0–0.4)
Alpha2 Glob SerPl Elph-Mcnc: 0.7 g/dL (ref 0.4–1.0)
B-Globulin SerPl Elph-Mcnc: 0.8 g/dL (ref 0.7–1.3)
Gamma Glob SerPl Elph-Mcnc: 0.6 g/dL (ref 0.4–1.8)
Globulin, Total: 2.3 g/dL (ref 2.2–3.9)
IgA: 86 mg/dL — ABNORMAL LOW (ref 87–352)
IgG (Immunoglobin G), Serum: 575 mg/dL — ABNORMAL LOW (ref 586–1602)
IgM (Immunoglobulin M), Srm: 22 mg/dL — ABNORMAL LOW (ref 26–217)
Total Protein ELP: 5.9 g/dL — ABNORMAL LOW (ref 6.0–8.5)

## 2021-11-03 NOTE — Progress Notes (Signed)
HEMATOLOGY/ONCOLOGY CLINIC NOTE  Date of Service: 10/28/2021    Patient Care Team: [name], MD as PCP - General (Family Medicine) [name], [name], MD as PCP - Cardiology (Cardiology) [name], FNP as PCP - Family Medicine (Family Medicine) [name], [name], MD as Consulting Physician (Gastroenterology)  CHIEF COMPLAINTS/PURPOSE OF CONSULTATION:  Continued follow-up for evaluation and management of multiple myeloma  HISTORY OF PRESENTING ILLNESS:  Please see previous note for details on initial presentation  INTERVAL HISTORY  Megan Velasquez is a 61 y.o. female is here for continued evaluation and management of her multiple myeloma. She notes that she has been evaluated by vascular surgery for the color change in her leg and she was noted to have moderate arterial disease on the right side but no vascular interventions recommended at this time. Patient notes she is tolerating the Revlimid with slightly loose stools intermittently but no other acute new concerns. Labs done today were reviewed in detail with the patient.    MEDICAL HISTORY:  Past Medical History:  Diagnosis Date   Cancer ([place])    GERD (gastroesophageal reflux disease)    History of kidney stones    HTN (hypertension)    Multiple myeloma ([place])    Renal disorder     SURGICAL HISTORY: Past Surgical History:  Procedure Laterality Date   CHOLECYSTECTOMY     COLONOSCOPY  01/2015   Dr. Britta Mccreedy: Mild diverticulosis, sessile polyp ranging 3 to 5 mm removed from the proximal transverse colon, semi-pedunculated polyp 5 to 9 mm in size removed from the sigmoid colon.  Sigmoid colon polyp was serrated adenoma, transverse colon polyp was adenomatous.  Patient was told to have another colonoscopy in 3 years.   COLONOSCOPY WITH PROPOFOL N/A 10/11/2017   Procedure: COLONOSCOPY WITH PROPOFOL;  Surgeon: Daneil Dolin, MD;  [facility];  Service: Endoscopy;  Laterality: N/A;  1:15pm   IR IMAGING  GUIDED PORT INSERTION  06/05/2020   POLYPECTOMY  10/11/2017   Procedure: POLYPECTOMY;  Surgeon: Daneil Dolin, MD;  [facility];  Service: Endoscopy;;  descending colon polyps cs times 2    SOCIAL HISTORY: Social History   Socioeconomic History   Marital status: Married    Spouse name: Not on file   Number of children: Not on file   Years of education: Not on file   Highest education level: Not on file  Occupational History   Not on file  Tobacco Use   Smoking status: Former    Packs/day: 1.00    Years: 28.00    Total pack years: 28.00    Types: Cigarettes    Quit date: 10/08/2013    Years since quitting: 8.0   Smokeless tobacco: Never  Vaping Use   Vaping Use: Never used  Substance and Sexual Activity   Alcohol use: Not Currently   Drug use: Never   Sexual activity: Yes    Birth control/protection: Post-menopausal  Other Topics Concern   Not on file  Social History Narrative   Not on file   Social Determinants of Health   Financial Resource Strain: Low Risk  (06/06/2020)   Overall Financial Resource Strain (CARDIA)    Difficulty of Paying Living Expenses: Not hard at all  Food Insecurity: No Food Insecurity (06/06/2020)   Hunger Vital Sign    Worried About Running Out of Food in the Last Year: Never true    [place] in the Last Year: Never true  Transportation Needs: No Transportation Needs (06/06/2020)   PRAPARE - Hydrologist (Medical): No    Lack of Transportation (Non-Medical): No  Physical Activity: Not on file  Stress: No Stress Concern Present (06/06/2020)   [place]    Feeling of Stress : Only a little  Social Connections: Unknown (06/06/2020)   Social Connection and Isolation Panel [NHANES]    Frequency of Communication with Friends and Family: More than three times a week    Frequency of Social Gatherings with Friends and Family: More than three  times a week    Attends Religious Services: Not on Advertising copywriter or Organizations: Not on file    Attends Archivist Meetings: Not on file    Marital Status: Married  Human resources officer Violence: Not on file    FAMILY HISTORY: Family History  Problem Relation Age of Onset   Heart disease Mother    Diabetes Mother    Lung cancer Father    COPD Father    Colon cancer Neg Hx     ALLERGIES:  has No Known Allergies.  MEDICATIONS:  Current Outpatient Medications  Medication Sig Dispense Refill   acetaminophen (TYLENOL) 500 MG tablet Take 1,000 mg by mouth every 6 (six) hours as needed for fever or headache.     acyclovir (ZOVIRAX) 400 MG tablet Take 1 tablet by mouth twice daily 30 tablet 0   ADVAIR HFA 230-21 MCG/ACT inhaler Inhale 2 puffs into the lungs 2 (two) times daily.     apixaban (ELIQUIS) 2.5 MG TABS tablet Take 1 tablet (2.5 mg total) by mouth 2 (two) times daily. 60 tablet 5   Ascorbic Acid (VITAMIN C) 1000 MG tablet Take 1,000 mg by mouth every morning.     b complex vitamins capsule Take 1 capsule by mouth daily. (Patient taking differently: Take 1 capsule by mouth every morning.) 30 capsule 3   Cholecalciferol (VITAMIN D3) 5000 units CAPS Take 5,000 Units by mouth every morning.     Cyanocobalamin (VITAMIN B12) 3000 MCG SUBL Place 3,000 mcg under the tongue every morning.     loratadine (CLARITIN) 10 MG tablet Take 10 mg by mouth every morning.     omeprazole (PRILOSEC) 40 MG capsule Take 40 mg by mouth daily.     ondansetron (ZOFRAN) 8 MG tablet Take 1 tablet (8 mg total) by mouth 2 (two) times daily as needed (Nausea or vomiting). 30 tablet 1   REVLIMID 10 MG capsule TAKE 1 CAPSULE BY MOUTH DAILY FOR 21 DAYS, THEN TAKE 7 DAYS OFF . REPEAT EVERY 28 DAYS 21 capsule 0   rosuvastatin (CRESTOR) 10 MG tablet Take 10 mg by mouth daily.     Saccharomyces boulardii 500 MG PACK Take 500 mg by mouth 2 (two) times daily at 8 am and 10 pm. 20 each 0   No  current facility-administered medications for this visit.    REVIEW OF SYSTEMS:   10 Point review of Systems was done is negative except as noted above.   PHYSICAL EXAMINATION: ECOG PERFORMANCE STATUS: 1 - Symptomatic but completely ambulatory .BP 120/66   Pulse 68   Temp (!) 97.5 F (36.4 C)   Resp 18   Wt 130 lb 14.4 oz (59.4 kg)   SpO2 100%   BMI 21.78 kg/m  NAD GENERAL:alert, in no acute distress and comfortable SKIN: no acute rashes, no significant lesions EYES: conjunctiva  are pink and non-injected, sclera anicteric OROPHARYNX: MMM, no exudates, no oropharyngeal erythema or ulceration NECK: supple, no JVD LYMPH:  no palpable lymphadenopathy in the cervical, axillary or inguinal regions LUNGS: clear to auscultation b/l with normal respiratory effort HEART: regular rate & rhythm ABDOMEN:  normoactive bowel sounds , non tender, not distended. Extremity: no pedal edema PSYCH: alert & oriented x 3 with fluent speech NEURO: no focal motor/sensory deficits  LABORATORY DATA:  I have reviewed the data as listed  .    Latest Ref Rng & Units 10/28/2021    9:46 AM 08/27/2021    8:54 AM 07/24/2021   10:50 AM  CBC  WBC 4.0 - 10.5 K/uL 5.7  3.5  5.1   Hemoglobin 12.0 - 15.0 g/dL 12.9  13.5  14.0   Hematocrit 36.0 - 46.0 % 37.3  39.0  41.2   Platelets 150 - 400 K/uL 141  149  196     .    Latest Ref Rng & Units 10/28/2021    9:46 AM 08/27/2021    8:54 AM 07/24/2021   10:50 AM  CMP  Glucose 70 - 99 mg/dL 120  123  101   BUN 8 - 23 mg/dL 16  19  23    Creatinine 0.44 - 1.00 mg/dL 0.95  0.96  0.96   Sodium 135 - 145 mmol/L 139  137  138   Potassium 3.5 - 5.1 mmol/L 3.6  3.7  4.0   Chloride 98 - 111 mmol/L 105  103  103   CO2 22 - 32 mmol/L 27  27  28    Calcium 8.9 - 10.3 mg/dL 8.7  8.9  9.4   Total Protein 6.5 - 8.1 g/dL 6.2  6.5  7.0   Total Bilirubin 0.3 - 1.2 mg/dL 0.7  0.8  0.6   Alkaline Phos 38 - 126 U/L 82  83  86   AST 15 - 41 U/L 15  16  16    ALT 0 - 44 U/L 13   14  14       Most recent lab results (03/15/2020) of CBC is as follows: all values are WNL except for RBC at 3.66, Hgb at 9.7, HCT at 31.8, MCHC at 30.5, RDW Standard Deviation 58.7, Red Cell Distribution Width At 18.6,  BUN at 19, Creatinine at 1.06, Total Protein at 10.2, Albumin at 2.7, AST at 14,. 03/13/2020 UPEP shows no M-Spike, all values are WNL 03/06/2020 SPEP shows Total Protein at 10.1, Beta Globulin at 5.2, M-Spike at 4.3, Total Globulin at 6.4, A/G Ratio at 0.6 03/06/2020 Free Kappa Light Chains at 181.1, Free Lambda Light Chains Serum at 3.6, K/L ratio at 50.31 03/06/2020 Beta-2 Microglobulin at 2.5           RADIOGRAPHIC STUDIES: I have personally reviewed the radiological images as listed and agreed with the findings in the report. ECHOCARDIOGRAM COMPLETE  Result Date: 10/10/2021    ECHOCARDIOGRAM REPORT   Patient Name:   ATZIRY BARANSKI Date of Exam: 10/10/2021 Medical Rec #:  [id number]    Height:       65.0 in Accession #:    [id number]   Weight:       127.0 lb Date of Birth:  1961-01-08    BSA:          1.631 m Patient Age:    65 years     BP:           130/60 mmHg Patient Gender: F  HR:           66 bpm. Exam Location:  [facility] Procedure: 2D Echo, Cardiac Doppler, Color Doppler and Strain Analysis Indications:    I70.90 Atherosclerosis  History:        Patient has no prior history of Echocardiogram examinations. PAD                 and Aortic calcification seen on CT scan, Multiple myeloma; Risk                 Factors:Hypertension, Dyslipidemia and Former Smoker.  Sonographer:    Jeneen Montgomery RDMS, RVT, RDCS Referring Phys: [id number] [place]  1. Left ventricular ejection fraction, by estimation, is 70 to 75%. Left ventricular ejection fraction by 3D volume is 73 %. The left ventricle has hyperdynamic function. The left ventricle has no regional wall motion abnormalities. Left ventricular diastolic parameters were normal. The average left ventricular  global longitudinal strain is -24.3 %. The global longitudinal strain is normal.  2. Right ventricular systolic function is normal. The right ventricular size is normal.  3. The mitral valve is normal in structure. No evidence of mitral valve regurgitation. No evidence of mitral stenosis.  4. The aortic valve is normal in structure. Aortic valve regurgitation is not visualized. No aortic stenosis is present.  5. The inferior vena cava is normal in size with greater than 50% respiratory variability, suggesting right atrial pressure of 3 mmHg. FINDINGS  Left Ventricle: Left ventricular ejection fraction, by estimation, is 70 to 75%. Left ventricular ejection fraction by 3D volume is 73 %. The left ventricle has hyperdynamic function. The left ventricle has no regional wall motion abnormalities. The average left ventricular global longitudinal strain is -24.3 %. The global longitudinal strain is normal. The left ventricular internal cavity size was normal in size. There is no left ventricular hypertrophy. Left ventricular diastolic parameters were normal. Right Ventricle: The right ventricular size is normal. No increase in right ventricular wall thickness. Right ventricular systolic function is normal. Left Atrium: Left atrial size was normal in size. Right Atrium: Right atrial size was normal in size. Pericardium: There is no evidence of pericardial effusion. Mitral Valve: The mitral valve is normal in structure. No evidence of mitral valve regurgitation. No evidence of mitral valve stenosis. Tricuspid Valve: The tricuspid valve is normal in structure. Tricuspid valve regurgitation is not demonstrated. No evidence of tricuspid stenosis. Aortic Valve: The aortic valve is normal in structure. Aortic valve regurgitation is not visualized. No aortic stenosis is present. Pulmonic Valve: The pulmonic valve was normal in structure. Pulmonic valve regurgitation is not visualized. No evidence of pulmonic stenosis. Aorta: The  aortic root is normal in size and structure. Venous: The inferior vena cava is normal in size with greater than 50% respiratory variability, suggesting right atrial pressure of 3 mmHg. IAS/Shunts: No atrial level shunt detected by color flow Doppler.  LEFT VENTRICLE PLAX 2D LVIDd:         4.51 cm         Diastology LVIDs:         2.43 cm         LV e' medial:    9.46 cm/s LV PW:         0.81 cm         LV E/e' medial:  9.8 LV IVS:        0.35 cm         LV e' lateral:  13.70 cm/s LVOT diam:     1.70 cm         LV E/e' lateral: 6.8 LV SV:         74 LV SV Index:   45              2D LVOT Area:     2.27 cm        Longitudinal                                Strain                                2D Strain GLS  -27.7 % LV Volumes (MOD)               (A2C): LV vol d, MOD    39.5 ml       2D Strain GLS  -21.7 % A2C:                           (A3C): LV vol d, MOD    60.4 ml       2D Strain GLS  -23.3 % A4C:                           (A4C): LV vol s, MOD    8.9 ml        2D Strain GLS  -24.3 % A2C:                           Avg: LV vol s, MOD    11.9 ml A4C:                           3D Volume EF LV SV MOD A2C:   30.6 ml       LV 3D EF:    Left LV SV MOD A4C:   60.4 ml                    ventricul LV SV MOD BP:    38.2 ml                    ar                                             ejection                                             fraction                                             by 3D  volume is                                             73 %.                                 3D Volume EF:                                3D EF:        73 % RIGHT VENTRICLE RV S prime:     13.20 cm/s TAPSE (M-mode): 2.1 cm LEFT ATRIUM             Index        RIGHT ATRIUM          Index LA diam:        3.00 cm 1.84 cm/m   RA Area:     8.35 cm LA Vol (A2C):   33.0 ml 20.23 ml/m  RA Volume:   15.20 ml 9.32 ml/m LA Vol (A4C):   29.1 ml 17.84 ml/m LA Biplane Vol: 31.9 ml 19.56 ml/m   AORTIC VALVE LVOT Vmax:   160.50 cm/s LVOT Vmean:  98.000 cm/s LVOT VTI:    0.325 m  AORTA Ao Root diam: 3.10 cm Ao Arch diam: 2.4 cm MITRAL VALVE               TRICUSPID VALVE MV Area (PHT): 3.12 cm    TR Peak grad:   28.1 mmHg MV Decel Time: 243 msec    TR Vmax:        265.00 cm/s MV E velocity: 92.50 cm/s MV A velocity: 85.70 cm/s  SHUNTS MV E/A ratio:  1.08        Systemic VTI:  0.32 m                            Systemic Diam: 1.70 cm Candee Furbish MD Electronically signed by Candee Furbish MD Signature Date/Time: 10/10/2021/10:13:00 AM    Final     ASSESSMENT & PLAN:   61 year old female here for continued follow-up, evaluation and management of multiple myeloma  #1  IgA kappa multiple myeloma status post induction treatment with carfilzomib Revlimid dexamethasone  presenting with anemia hemoglobin 9.4. Creatinine 1.28 No hypercalcemia Bone survey with no obvious skeletal lesions. Baseline M spike of 4.3 g/dL. Beta-2 Microglobulin at 2.5 Mol Cy Monosomy 13 and dup 1q  2) status post stem cell collection assisted with G-CSF and Plerixafor. Currently on maintenance Revlimid. Currently had declined proceeding with consolidative autologous HSCT  3) Diarrhea - ? Related to Revlimid.  Mild and nearly resolved GI panel and c diff neg30  PLAN: -Patient's labs from 10/28/2021 were discussed in detail with the patient CBC within normal limits except platelets of 141k CMP stable. Myeloma panel shows no M spike Patient reports no significant toxicities from her current dose of Revlimid at this time. Patient has no symptom suggestive of disease progression    FOLLOW UP: RTC with Dr Irene Limbo with labs in 2 month  The total time spent in the appointment was 25 minutes*.  All of the patient's questions were answered with apparent satisfaction. The patient knows to call the  clinic with any problems, questions or concerns.   Sullivan Lone MD MS AAHIVMS [facility] [place] Hematology/Oncology Physician [facility]  .*Total Encounter Time as defined by the Centers for Medicare and Medicaid Services includes, in addition to the face-to-face time of a patient visit (documented in the note above) non-face-to-face time: obtaining and reviewing outside history, ordering and reviewing medications, tests or procedures, care coordination (communications with other health care professionals or caregivers) and documentation in the medical record.

## 2021-11-03 NOTE — Progress Notes (Incomplete)
HEMATOLOGY/ONCOLOGY CLINIC NOTE  Date of Service: 10/28/2021    Patient Care Team: [name], MD as PCP - General (Family Medicine) [name], [name], MD as PCP - Cardiology (Cardiology) [name], FNP as PCP - Family Medicine (Family Medicine) [name], [name], MD as Consulting Physician (Gastroenterology)  CHIEF COMPLAINTS/PURPOSE OF CONSULTATION:  Continued follow-up for evaluation and management of multiple myeloma  HISTORY OF PRESENTING ILLNESS:  Please see previous note for details on initial presentation  INTERVAL HISTORY  Bryauna Byrum is a 61 y.o. female is here for continued evaluation and management of her multiple myeloma. She notes that she has been evaluated by vascular surgery for the color change in her leg and she was noted to have moderate arterial disease on the right side but no vascular interventions recommended at this time. Patient notes she is tolerating the Revlimid with slightly loose stools intermittently but no other acute new concerns. Labs done today were reviewed in detail with the patient.    MEDICAL HISTORY:  Past Medical History:  Diagnosis Date  . Cancer ([place])   . GERD (gastroesophageal reflux disease)   . History of kidney stones   . HTN (hypertension)   . Multiple myeloma ([place])   . Renal disorder     SURGICAL HISTORY: Past Surgical History:  Procedure Laterality Date  . CHOLECYSTECTOMY    . COLONOSCOPY  01/2015   Dr. Britta Mccreedy: Mild diverticulosis, sessile polyp ranging 3 to 5 mm removed from the proximal transverse colon, semi-pedunculated polyp 5 to 9 mm in size removed from the sigmoid colon.  Sigmoid colon polyp was serrated adenoma, transverse colon polyp was adenomatous.  Patient was told to have another colonoscopy in 3 years.  . COLONOSCOPY WITH PROPOFOL N/A 10/11/2017   Procedure: COLONOSCOPY WITH PROPOFOL;  Surgeon: Daneil Dolin, MD;  [facility]: [facility];  Service: Endoscopy;  Laterality: N/A;  1:15pm  . IR  IMAGING GUIDED PORT INSERTION  06/05/2020  . POLYPECTOMY  10/11/2017   Procedure: POLYPECTOMY;  Surgeon: Daneil Dolin, MD;  Location: [facility];  Service: Endoscopy;;  descending colon polyps cs times 2    SOCIAL HISTORY: Social History   Socioeconomic History  . Marital status: Married    Spouse name: Not on file  . Number of children: Not on file  . Years of education: Not on file  . Highest education level: Not on file  Occupational History  . Not on file  Tobacco Use  . Smoking status: Former    Packs/day: 1.00    Years: 28.00    Total pack years: 28.00    Types: Cigarettes    Quit date: 10/08/2013    Years since quitting: 8.0  . Smokeless tobacco: Never  Vaping Use  . Vaping Use: Never used  Substance and Sexual Activity  . Alcohol use: Not Currently  . Drug use: Never  . Sexual activity: Yes    Birth control/protection: Post-menopausal  Other Topics Concern  . Not on file  Social History Narrative  . Not on file   Social Determinants of Health   Financial Resource Strain: Low Risk  (06/06/2020)   Overall Financial Resource Strain (CARDIA)   . Difficulty of Paying Living Expenses: Not hard at all  Food Insecurity: No Food Insecurity (06/06/2020)   Hunger Vital Sign   . Worried About Charity fundraiser in the Last Year: Never true   . Ran Out of Food in the Last Year: Never true  Transportation Needs: No Transportation Needs (06/06/2020)   PRAPARE - Transportation   . Lack of Transportation (Medical): No   . Lack of Transportation (Non-Medical): No  Physical Activity: Not on file  Stress: No Stress Concern Present (06/06/2020)   [place]   . Feeling of Stress : Only a little  Social Connections: Unknown (06/06/2020)   Social Connection and Isolation Panel [NHANES]   . Frequency of Communication with Friends and Family: More than three times a week   . Frequency of Social Gatherings with Friends  and Family: More than three times a week   . Attends Religious Services: Not on file   . Active Member of Clubs or Organizations: Not on file   . Attends Archivist Meetings: Not on file   . Marital Status: Married  Human resources officer Violence: Not on file    FAMILY HISTORY: Family History  Problem Relation Age of Onset  . Heart disease Mother   . Diabetes Mother   . Lung cancer Father   . COPD Father   . Colon cancer Neg Hx     ALLERGIES:  has No Known Allergies.  MEDICATIONS:  Current Outpatient Medications  Medication Sig Dispense Refill  . acetaminophen (TYLENOL) 500 MG tablet Take 1,000 mg by mouth every 6 (six) hours as needed for fever or headache.    Marland Kitchen acyclovir (ZOVIRAX) 400 MG tablet Take 1 tablet by mouth twice daily 30 tablet 0  . ADVAIR HFA 230-21 MCG/ACT inhaler Inhale 2 puffs into the lungs 2 (two) times daily.    Marland Kitchen apixaban (ELIQUIS) 2.5 MG TABS tablet Take 1 tablet (2.5 mg total) by mouth 2 (two) times daily. 60 tablet 5  . Ascorbic Acid (VITAMIN C) 1000 MG tablet Take 1,000 mg by mouth every morning.    Marland Kitchen b complex vitamins capsule Take 1 capsule by mouth daily. (Patient taking differently: Take 1 capsule by mouth every morning.) 30 capsule 3  . Cholecalciferol (VITAMIN D3) 5000 units CAPS Take 5,000 Units by mouth every morning.    . Cyanocobalamin (VITAMIN B12) 3000 MCG SUBL Place 3,000 mcg under the tongue every morning.    . loratadine (CLARITIN) 10 MG tablet Take 10 mg by mouth every morning.    Marland Kitchen omeprazole (PRILOSEC) 40 MG capsule Take 40 mg by mouth daily.    . ondansetron (ZOFRAN) 8 MG tablet Take 1 tablet (8 mg total) by mouth 2 (two) times daily as needed (Nausea or vomiting). 30 tablet 1  . REVLIMID 10 MG capsule TAKE 1 CAPSULE BY MOUTH DAILY FOR 21 DAYS, THEN TAKE 7 DAYS OFF . REPEAT EVERY 28 DAYS 21 capsule 0  . rosuvastatin (CRESTOR) 10 MG tablet Take 10 mg by mouth daily.    . Saccharomyces boulardii 500 MG PACK Take 500 mg by mouth 2  (two) times daily at 8 am and 10 pm. 20 each 0   No current facility-administered medications for this visit.    REVIEW OF SYSTEMS:   10 Point review of Systems was done is negative except as noted above.   PHYSICAL EXAMINATION: ECOG PERFORMANCE STATUS: 1 - Symptomatic but completely ambulatory .BP 120/66   Pulse 68   Temp (!) 97.5 F (36.4 C)   Resp 18   Wt 130 lb 14.4 oz (59.4 kg)   SpO2 100%   BMI 21.78 kg/m  NAD GENERAL:alert, in no acute distress and comfortable SKIN: no acute rashes, no significant lesions EYES: conjunctiva  are pink and non-injected, sclera anicteric OROPHARYNX: MMM, no exudates, no oropharyngeal erythema or ulceration NECK: supple, no JVD LYMPH:  no palpable lymphadenopathy in the cervical, axillary or inguinal regions LUNGS: clear to auscultation b/l with normal respiratory effort HEART: regular rate & rhythm ABDOMEN:  normoactive bowel sounds , non tender, not distended. Extremity: no pedal edema PSYCH: alert & oriented x 3 with fluent speech NEURO: no focal motor/sensory deficits  LABORATORY DATA:  I have reviewed the data as listed  .    Latest Ref Rng & Units 10/28/2021    9:46 AM 08/27/2021    8:54 AM 07/24/2021   10:50 AM  CBC  WBC 4.0 - 10.5 K/uL 5.7  3.5  5.1   Hemoglobin 12.0 - 15.0 g/dL 12.9  13.5  14.0   Hematocrit 36.0 - 46.0 % 37.3  39.0  41.2   Platelets 150 - 400 K/uL 141  149  196     .    Latest Ref Rng & Units 10/28/2021    9:46 AM 08/27/2021    8:54 AM 07/24/2021   10:50 AM  CMP  Glucose 70 - 99 mg/dL 120  123  101   BUN 8 - 23 mg/dL 16  19  23    Creatinine 0.44 - 1.00 mg/dL 0.95  0.96  0.96   Sodium 135 - 145 mmol/L 139  137  138   Potassium 3.5 - 5.1 mmol/L 3.6  3.7  4.0   Chloride 98 - 111 mmol/L 105  103  103   CO2 22 - 32 mmol/L 27  27  28    Calcium 8.9 - 10.3 mg/dL 8.7  8.9  9.4   Total Protein 6.5 - 8.1 g/dL 6.2  6.5  7.0   Total Bilirubin 0.3 - 1.2 mg/dL 0.7  0.8  0.6   Alkaline Phos 38 - 126 U/L 82  83   86   AST 15 - 41 U/L 15  16  16    ALT 0 - 44 U/L 13  14  14       Most recent lab results (03/15/2020) of CBC is as follows: all values are WNL except for RBC at 3.66, Hgb at 9.7, HCT at 31.8, MCHC at 30.5, RDW Standard Deviation 58.7, Red Cell Distribution Width At 18.6,  BUN at 19, Creatinine at 1.06, Total Protein at 10.2, Albumin at 2.7, AST at 14,. 03/13/2020 UPEP shows no M-Spike, all values are WNL 03/06/2020 SPEP shows Total Protein at 10.1, Beta Globulin at 5.2, M-Spike at 4.3, Total Globulin at 6.4, A/G Ratio at 0.6 03/06/2020 Free Kappa Light Chains at 181.1, Free Lambda Light Chains Serum at 3.6, K/L ratio at 50.31 03/06/2020 Beta-2 Microglobulin at 2.5           RADIOGRAPHIC STUDIES: I have personally reviewed the radiological images as listed and agreed with the findings in the report. ECHOCARDIOGRAM COMPLETE  Result Date: 10/10/2021    ECHOCARDIOGRAM REPORT   Patient Name:   GAELLE ADRIANCE Date of Exam: 10/10/2021 Medical Rec #:  [id number]    Height:       65.0 in Accession #:    [id number]   Weight:       127.0 lb Date of Birth:  March 05, 1961    BSA:          1.631 m Patient Age:    61 years     BP:           130/60 mmHg Patient Gender: F  HR:           66 bpm. Exam Location:  [facility] Procedure: 2D Echo, Cardiac Doppler, Color Doppler and Strain Analysis Indications:    I70.90 Atherosclerosis  History:        Patient has no prior history of Echocardiogram examinations. PAD                 and Aortic calcification seen on CT scan, Multiple myeloma; Risk                 Factors:Hypertension, Dyslipidemia and Former Smoker.  Sonographer:    Jeneen Montgomery RDMS, RVT, RDCS Referring Phys: [id number] [place]  1. Left ventricular ejection fraction, by estimation, is 70 to 75%. Left ventricular ejection fraction by 3D volume is 73 %. The left ventricle has hyperdynamic function. The left ventricle has no regional wall motion abnormalities. Left ventricular diastolic  parameters were normal. The average left ventricular global longitudinal strain is -24.3 %. The global longitudinal strain is normal.  2. Right ventricular systolic function is normal. The right ventricular size is normal.  3. The mitral valve is normal in structure. No evidence of mitral valve regurgitation. No evidence of mitral stenosis.  4. The aortic valve is normal in structure. Aortic valve regurgitation is not visualized. No aortic stenosis is present.  5. The inferior vena cava is normal in size with greater than 50% respiratory variability, suggesting right atrial pressure of 3 mmHg. FINDINGS  Left Ventricle: Left ventricular ejection fraction, by estimation, is 70 to 75%. Left ventricular ejection fraction by 3D volume is 73 %. The left ventricle has hyperdynamic function. The left ventricle has no regional wall motion abnormalities. The average left ventricular global longitudinal strain is -24.3 %. The global longitudinal strain is normal. The left ventricular internal cavity size was normal in size. There is no left ventricular hypertrophy. Left ventricular diastolic parameters were normal. Right Ventricle: The right ventricular size is normal. No increase in right ventricular wall thickness. Right ventricular systolic function is normal. Left Atrium: Left atrial size was normal in size. Right Atrium: Right atrial size was normal in size. Pericardium: There is no evidence of pericardial effusion. Mitral Valve: The mitral valve is normal in structure. No evidence of mitral valve regurgitation. No evidence of mitral valve stenosis. Tricuspid Valve: The tricuspid valve is normal in structure. Tricuspid valve regurgitation is not demonstrated. No evidence of tricuspid stenosis. Aortic Valve: The aortic valve is normal in structure. Aortic valve regurgitation is not visualized. No aortic stenosis is present. Pulmonic Valve: The pulmonic valve was normal in structure. Pulmonic valve regurgitation is not  visualized. No evidence of pulmonic stenosis. Aorta: The aortic root is normal in size and structure. Venous: The inferior vena cava is normal in size with greater than 50% respiratory variability, suggesting right atrial pressure of 3 mmHg. IAS/Shunts: No atrial level shunt detected by color flow Doppler.  LEFT VENTRICLE PLAX 2D LVIDd:         4.51 cm         Diastology LVIDs:         2.43 cm         LV e' medial:    9.46 cm/s LV PW:         0.81 cm         LV E/e' medial:  9.8 LV IVS:        0.35 cm         LV e' lateral:  13.70 cm/s LVOT diam:     1.70 cm         LV E/e' lateral: 6.8 LV SV:         74 LV SV Index:   45              2D LVOT Area:     2.27 cm        Longitudinal                                Strain                                2D Strain GLS  -27.7 % LV Volumes (MOD)               (A2C): LV vol d, MOD    39.5 ml       2D Strain GLS  -21.7 % A2C:                           (A3C): LV vol d, MOD    60.4 ml       2D Strain GLS  -23.3 % A4C:                           (A4C): LV vol s, MOD    8.9 ml        2D Strain GLS  -24.3 % A2C:                           Avg: LV vol s, MOD    11.9 ml A4C:                           3D Volume EF LV SV MOD A2C:   30.6 ml       LV 3D EF:    Left LV SV MOD A4C:   60.4 ml                    ventricul LV SV MOD BP:    38.2 ml                    ar                                             ejection                                             fraction                                             by 3D  volume is                                             73 %.                                 3D Volume EF:                                3D EF:        73 % RIGHT VENTRICLE RV S prime:     13.20 cm/s TAPSE (M-mode): 2.1 cm LEFT ATRIUM             Index        RIGHT ATRIUM          Index LA diam:        3.00 cm 1.84 cm/m   RA Area:     8.35 cm LA Vol (A2C):   33.0 ml 20.23 ml/m  RA Volume:   15.20 ml 9.32 ml/m LA Vol (A4C):    29.1 ml 17.84 ml/m LA Biplane Vol: 31.9 ml 19.56 ml/m  AORTIC VALVE LVOT Vmax:   160.50 cm/s LVOT Vmean:  98.000 cm/s LVOT VTI:    0.325 m  AORTA Ao Root diam: 3.10 cm Ao Arch diam: 2.4 cm MITRAL VALVE               TRICUSPID VALVE MV Area (PHT): 3.12 cm    TR Peak grad:   28.1 mmHg MV Decel Time: 243 msec    TR Vmax:        265.00 cm/s MV E velocity: 92.50 cm/s MV A velocity: 85.70 cm/s  SHUNTS MV E/A ratio:  1.08        Systemic VTI:  0.32 m                            Systemic Diam: 1.70 cm Candee Furbish MD Electronically signed by Candee Furbish MD Signature Date/Time: 10/10/2021/10:13:00 AM    Final     ASSESSMENT & PLAN:   61 year old female here for continued follow-up, evaluation and management of multiple myeloma  #1  IgA kappa multiple myeloma status post induction treatment with carfilzomib Revlimid dexamethasone  presenting with anemia hemoglobin 9.4. Creatinine 1.28 No hypercalcemia Bone survey with no obvious skeletal lesions. Baseline M spike of 4.3 g/dL. Beta-2 Microglobulin at 2.5 Mol Cy Monosomy 13 and dup 1q  2) status post stem cell collection assisted with G-CSF and Plerixafor. Currently on maintenance Revlimid. Currently had declined proceeding with consolidative autologous HSCT  3) Diarrhea - ? Related to Revlimid.  Mild and nearly resolved GI panel and c diff neg30  PLAN: -Patient's labs from 10/28/2021 were discussed in detail with the patient CBC within normal limits except platelets of 141k CMP stable. Patient reports no significant toxicities from her current dose of Revlimid at this time. Patient has no symptom suggestive of disease progression   -She notes her diarrhea has improved. No indication to reduce current Revlimid dosage at this time. -Was recommended 2-4 week trial of probiotics.  -We discussed speaking with PCP about changing Omeprazole to Pepcid if tolerated. -RTC in 2 months with labs.  FOLLOW UP: RTC  with Dr Irene Limbo with labs in 2 month  .The  total time spent in the appointment was 30 minutes* .  All of the patient's questions were answered with apparent satisfaction. The patient knows to call the clinic with any problems, questions or concerns.   Sullivan Lone MD MS AAHIVMS [facility] Hematology/Oncology Physician [facility]  .*Total Encounter Time as defined by the Centers for Medicare and Medicaid Services includes, in addition to the face-to-face time of a patient visit (documented in the note above) non-face-to-face time: obtaining and reviewing outside history, ordering and reviewing medications, tests or procedures, care coordination (communications with other health care professionals or caregivers) and documentation in the medical record.  I, Melene Muller, am acting as scribe for Dr. Sullivan Lone, MD.  .I have reviewed the above documentation for accuracy and completeness, and I agree with the above. Brunetta Genera MD

## 2021-11-04 ENCOUNTER — Encounter: Payer: Self-pay | Admitting: Hematology

## 2021-11-04 ENCOUNTER — Other Ambulatory Visit: Payer: Self-pay

## 2021-12-01 ENCOUNTER — Other Ambulatory Visit: Payer: Self-pay | Admitting: Hematology

## 2021-12-01 DIAGNOSIS — C9000 Multiple myeloma not having achieved remission: Secondary | ICD-10-CM

## 2021-12-02 ENCOUNTER — Other Ambulatory Visit: Payer: Self-pay

## 2021-12-02 ENCOUNTER — Encounter: Payer: Self-pay | Admitting: Hematology

## 2021-12-02 ENCOUNTER — Other Ambulatory Visit: Payer: Self-pay | Admitting: *Deleted

## 2021-12-02 DIAGNOSIS — M79605 Pain in left leg: Secondary | ICD-10-CM

## 2021-12-02 DIAGNOSIS — M79604 Pain in right leg: Secondary | ICD-10-CM

## 2021-12-02 DIAGNOSIS — I739 Peripheral vascular disease, unspecified: Secondary | ICD-10-CM

## 2021-12-11 NOTE — Progress Notes (Signed)
I called Megan Velasquez to follow-up recent CT angio abdomen pelvis with runoff for differential diagnosis of blue toe syndrome, chilblains disease.  Unfortunately, I do not have access to the CT scan that was performed in [place].  My office will be working to coordinate its uploaded to PACS.  I told both Megan Velasquez and her husband Megan Velasquez that I appreciate their understanding regarding the above and that I will contact them immediately once the issue has been resolved.  Megan John MD   __________________________________   12/11/21  I called Megan Velasquez and her husband status post CT angiogram abdomen pelvis with runoff. 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  There is some mild disease in the right lower extremity, nonflow limiting otherwise.  During our phone call, Megan Velasquez noted improvement in right lower extremity discoloration.  No wounds, no rest pain, no claudication.  I do not have a good reason as to why she has some waxing waning mottling in the toes.  Cap refill was normal at her last visit. Usually in the setting of atheroembolic event, or cardioembolic event, there is no appreciable cap refill at the area of mottling.    He does have peripheral arterial disease and a nidus being the terminal aorta.  She would benefit from high intensity statin medication in an effort to stabilize plaque.  Should she have another episode or mottling worsen, we would pursue bilateral lower extremity angiography and likely stent over the iliac lesion.  That she has improved, this can be managed with the use of medications at this time.  Her embolic events are best managed with anticoagulation, which Megan Velasquez is on.  She would however benefit from echocardiography as well as CT angio of the chest to ensure there are no other possible lesions that could be a nidus.  I plan to refer her to Dr. [name] cardiology, and will send this to Dr. Virgel Bouquet for further discussion regarding high  intensity statin therapy. Plan to see Megan Velasquez back in 3 months time with repeat ABI.   Megan John MD

## 2021-12-12 ENCOUNTER — Encounter: Payer: Self-pay | Admitting: Physician Assistant

## 2021-12-12 ENCOUNTER — Ambulatory Visit (HOSPITAL_COMMUNITY)
Admission: RE | Admit: 2021-12-12 | Discharge: 2021-12-12 | Disposition: A | Discharge status: Home / Self Care | Payer: BC Managed Care – PPO | Source: Ambulatory Visit | Attending: Vascular Surgery | Admitting: Vascular Surgery

## 2021-12-12 ENCOUNTER — Ambulatory Visit: Payer: BC Managed Care – PPO | Admitting: Physician Assistant

## 2021-12-12 VITALS — BP 99/65 | HR 76 | Temp 97.6°F | Resp 20 | Ht 65.0 in | Wt 127.1 lb

## 2021-12-12 DIAGNOSIS — I739 Peripheral vascular disease, unspecified: Secondary | ICD-10-CM | POA: Insufficient documentation

## 2021-12-12 DIAGNOSIS — M79605 Pain in left leg: Secondary | ICD-10-CM | POA: Insufficient documentation

## 2021-12-12 DIAGNOSIS — M79604 Pain in right leg: Secondary | ICD-10-CM | POA: Diagnosis present

## 2021-12-12 NOTE — Progress Notes (Signed)
[facility] [place] HISTORY AND PHYSICAL   History of Present Illness:  Patient is a 61 y.o. year old female who presents for evaluation of PAD with ecchymosis appearing right GT for 2 1/2 months at presentation.    She denied symptoms of claudication, ischemic rest pain, tissue loss.  She has known right LE peripheral; artery disease without palpable femoral or predial pulses.   Dr. Virl Cagey recommended she be medially managed with Statin.  Should she have another episode or mottling worsen, we would pursue bilateral lower extremity angiography and likely stent over the iliac lesion.   She is followed by Dr. [name] cardiology.  She states she has had increase incidence of pain in her right foot/toes that requires her to get out of bed to ambulate for the pain to subside.  She denies frank claudication or non healing wounds.  She continues to have blue toes.  She has on going cancer treatment with Revlimd as well as Eliquis 2.5 mg BID to prevent DVT due to the previous noted medication.  She has continued to take Crestor daily as well for maximum medical management.       Past Medical History:  Diagnosis Date   Cancer ([place])    GERD (gastroesophageal reflux disease)    History of kidney stones    HTN (hypertension)    Multiple myeloma ([place])    Renal disorder     Past Surgical History:  Procedure Laterality Date   CHOLECYSTECTOMY     COLONOSCOPY  01/2015   Dr. Britta Mccreedy: Mild diverticulosis, sessile polyp ranging 3 to 5 mm removed from the proximal transverse colon, semi-pedunculated polyp 5 to 9 mm in size removed from the sigmoid colon.  Sigmoid colon polyp was serrated adenoma, transverse colon polyp was adenomatous.  Patient was told to have another colonoscopy in 3 years.   COLONOSCOPY WITH PROPOFOL N/A 10/11/2017   Procedure: COLONOSCOPY WITH PROPOFOL;  Surgeon: Daneil Dolin, MD;  [facility];  Service: Endoscopy;  Laterality: N/A;  1:15pm   IR IMAGING  GUIDED PORT INSERTION  06/05/2020   POLYPECTOMY  10/11/2017   Procedure: POLYPECTOMY;  Surgeon: Daneil Dolin, MD;  [facility];  Service: Endoscopy;;  descending colon polyps cs times 2    ROS:   General:  No weight loss, Fever, chills  HEENT: No recent headaches, no nasal bleeding, no visual changes, no sore throat  Neurologic: No dizziness, blackouts, seizures. No recent symptoms of stroke or mini- stroke. No recent episodes of slurred speech, or temporary blindness.  Cardiac: No recent episodes of chest pain/pressure, no shortness of breath at rest.  No shortness of breath with exertion.  Denies history of atrial fibrillation or irregular heartbeat  Vascular: positive history of rest pain in feet.  No history of claudication.  No history of non-healing ulcer, No history of DVT   Pulmonary: No home oxygen, no productive cough, no hemoptysis,  No asthma or wheezing  Musculoskeletal:  [ ] Arthritis, [ ] Low back pain,  [ ] Joint pain  Hematologic:No history of hypercoagulable state.  No history of easy bleeding.  No history of anemia  Gastrointestinal: No hematochezia or melena,  No gastroesophageal reflux, no trouble swallowing  Urinary: [ ] chronic Kidney disease, [ ] on HD - [ ] MWF or [ ] TTHS, [ ] Burning with urination, [ ] Frequent urination, [ ] Difficulty urinating;   Skin: No rashes  Psychological: No history of anxiety,  No history of depression  Social History Social History   Tobacco Use   Smoking status: Former    Packs/day: 1.00    Years: 28.00    Total pack years: 28.00    Types: Cigarettes    Quit date: 10/08/2013    Years since quitting: 8.1   Smokeless tobacco: Never  Vaping Use   Vaping Use: Never used  Substance Use Topics   Alcohol use: Not Currently   Drug use: Never    Family History Family History  Problem Relation Age of Onset   Heart disease Mother    Diabetes Mother    Lung cancer Father    COPD Father    Colon cancer Neg  Hx     Allergies  No Known Allergies   Current Outpatient Medications  Medication Sig Dispense Refill   acetaminophen (TYLENOL) 500 MG tablet Take 1,000 mg by mouth every 6 (six) hours as needed for fever or headache.     ADVAIR HFA 230-21 MCG/ACT inhaler Inhale 2 puffs into the lungs 2 (two) times daily.     apixaban (ELIQUIS) 2.5 MG TABS tablet Take 1 tablet (2.5 mg total) by mouth 2 (two) times daily. 60 tablet 5   Ascorbic Acid (VITAMIN C) 1000 MG tablet Take 1,000 mg by mouth every morning.     b complex vitamins capsule Take 1 capsule by mouth daily. (Patient taking differently: Take 1 capsule by mouth every morning.) 30 capsule 3   Cholecalciferol (VITAMIN D3) 5000 units CAPS Take 5,000 Units by mouth every morning.     Cyanocobalamin (VITAMIN B12) 3000 MCG SUBL Place 3,000 mcg under the tongue every morning.     loratadine (CLARITIN) 10 MG tablet Take 10 mg by mouth every morning.     omeprazole (PRILOSEC) 40 MG capsule Take 40 mg by mouth daily.     ondansetron (ZOFRAN) 8 MG tablet Take 1 tablet (8 mg total) by mouth 2 (two) times daily as needed (Nausea or vomiting). 30 tablet 1   REVLIMID 10 MG capsule TAKE 1 CAPSULE BY MOUTH DAILY FOR 21 DAYS, THEN TAKE 7 DAYS OFF. REPEAT EVERY 28 DAYS 21 capsule 0   rosuvastatin (CRESTOR) 10 MG tablet Take 10 mg by mouth daily.     Saccharomyces boulardii 500 MG PACK Take 500 mg by mouth 2 (two) times daily at 8 am and 10 pm. 20 each 0   No current facility-administered medications for this visit.    Physical Examination  Vitals:   12/12/21 1351  BP: 99/65  Pulse: 76  Resp: 20  Temp: 97.6 F (36.4 C)  TempSrc: Temporal  SpO2: 98%  Weight: 127 lb 1.6 oz (57.7 kg)  Height: 5' 5" (1.651 m)    Body mass index is 21.15 kg/m.  General:  Alert and oriented, no acute distress HEENT: Normal Neck: No bruit or JVD Pulmonary: Clear to auscultation bilaterally Cardiac: Regular Rate and Rhythm without murmur Abdomen: Soft, non-tender,  non-distended, no mass, no scars Skin: No rash, right toes "blue" discoloration Extremity Pulses: palpable left femoral pulse, no pedal right pulses Musculoskeletal: No deformity or edema  Neurologic: Upper and lower extremity motor 5/5 and symmetric  DATA:      ABI Findings:  +---------+------------------+-----+----------+--------+  Right    Rt Pressure (mmHg)IndexWaveform  Comment   +---------+------------------+-----+----------+--------+  Brachial 100                                        +---------+------------------+-----+----------+--------+  PTA      55                0.52 monophasic          +---------+------------------+-----+----------+--------+  DP       39                0.37 monophasic          +---------+------------------+-----+----------+--------+  Great Toe                       Absent              +---------+------------------+-----+----------+--------+   +---------+------------------+-----+-----------+-------+  Left     Lt Pressure (mmHg)IndexWaveform   Comment  +---------+------------------+-----+-----------+-------+  Brachial 106                                        +---------+------------------+-----+-----------+-------+  PTA      94                0.89 multiphasic         +---------+------------------+-----+-----------+-------+  DP       109               1.03 multiphasic         +---------+------------------+-----+-----------+-------+  Great Toe80                0.75                     +---------+------------------+-----+-----------+-------+   +-------+-----------+-----------+------------+------------+  ABI/TBIToday's ABIToday's TBIPrevious ABIPrevious TBI  +-------+-----------+-----------+------------+------------+  Right  0.52       absent     0.56        absent        +-------+-----------+-----------+------------+------------+  Left   1.03       0.75       1.02         0.63          +-------+-----------+-----------+------------+------------+    Previous ABI 08/14/21.     Summary:  Right: Resting right ankle-brachial index indicates moderate right lower  extremity arterial disease. The right toe-brachial index is abnormal.   Left: Resting left ankle-brachial index is within normal range. The left  toe-brachial index is normal.    Reviewed by Dr. Robins 09/05/21  CT angiogram abdomen pelvis with runoff. 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  There is some mild disease in the right lower extremity, nonflow limiting otherwise.  ASSESSMENT/PLAN:  Right LE blue toes syndrome  ABI's today showed decreased wave form now monophasic wave forms from previous biphasic wave forms   PAD with right LE CT angiogram atherosclerotic disease at the aortic bifurcation.  There is some mild disease in the right lower extremity, nonflow limiting otherwise.  She has increased rest pain at night with decreased wave forms on ABI's I will set her up for angiogram with right LE runoff and possible intervention.  I have asked that Dr. Robins be evolved in planning secondary to her Cancer treatment with additional needs of Eliquis to protect from blood clotting events.  She and her husband agree to proceed.   Megan Velasquez PA-C [facility] [place] Office: [phone]  MD on call Robins 

## 2021-12-16 ENCOUNTER — Other Ambulatory Visit: Payer: Self-pay

## 2021-12-16 DIAGNOSIS — I739 Peripheral vascular disease, unspecified: Secondary | ICD-10-CM

## 2021-12-16 MED ORDER — ENOXAPARIN SODIUM 60 MG/0.6ML IJ SOSY: 1.0000 mg/kg | PREFILLED_SYRINGE | Freq: Two times a day (BID) | INTRAMUSCULAR | 0 refills | Status: DC

## 2021-12-19 ENCOUNTER — Other Ambulatory Visit: Payer: Self-pay

## 2021-12-19 DIAGNOSIS — C9001 Multiple myeloma in remission: Secondary | ICD-10-CM

## 2021-12-23 ENCOUNTER — Inpatient Hospital Stay: Payer: BC Managed Care – PPO | Admitting: Hematology

## 2021-12-23 ENCOUNTER — Other Ambulatory Visit: Payer: Self-pay

## 2021-12-23 ENCOUNTER — Inpatient Hospital Stay: Payer: BC Managed Care – PPO | Attending: Hematology

## 2021-12-23 VITALS — BP 132/61 | HR 77 | Temp 97.7°F | Resp 14 | Ht 65.0 in | Wt 128.9 lb

## 2021-12-23 DIAGNOSIS — C9001 Multiple myeloma in remission: Secondary | ICD-10-CM | POA: Diagnosis not present

## 2021-12-23 DIAGNOSIS — C9000 Multiple myeloma not having achieved remission: Secondary | ICD-10-CM | POA: Insufficient documentation

## 2021-12-23 DIAGNOSIS — Z95828 Presence of other vascular implants and grafts: Secondary | ICD-10-CM

## 2021-12-23 DIAGNOSIS — R197 Diarrhea, unspecified: Secondary | ICD-10-CM | POA: Diagnosis not present

## 2021-12-23 LAB — CMP (CANCER CENTER ONLY)
ALT: 16 U/L (ref 0–44)
AST: 18 U/L (ref 15–41)
Albumin: 4.3 g/dL (ref 3.5–5.0)
Alkaline Phosphatase: 74 U/L (ref 38–126)
Anion gap: 7 (ref 5–15)
BUN: 23 mg/dL (ref 8–23)
CO2: 25 mmol/L (ref 22–32)
Calcium: 8.9 mg/dL (ref 8.9–10.3)
Chloride: 106 mmol/L (ref 98–111)
Creatinine: 1.03 mg/dL — ABNORMAL HIGH (ref 0.44–1.00)
GFR, Estimated: >60 mL/min (ref >60)
Glucose, Bld: 114 mg/dL — ABNORMAL HIGH (ref 70–99)
Potassium: 3.5 mmol/L (ref 3.5–5.1)
Sodium: 138 mmol/L (ref 135–145)
Total Bilirubin: 0.9 mg/dL (ref 0.3–1.2)
Total Protein: 6.3 g/dL — ABNORMAL LOW (ref 6.5–8.1)

## 2021-12-23 LAB — CBC WITH DIFFERENTIAL (CANCER CENTER ONLY)
Abs Immature Granulocytes: 0.01 10*3/uL (ref 0.00–0.07)
Basophils Absolute: 0.1 10*3/uL (ref 0.0–0.1)
Basophils Relative: 1 %
Eosinophils Absolute: 0.1 10*3/uL (ref 0.0–0.5)
Eosinophils Relative: 2 %
HCT: 38.8 % (ref 36.0–46.0)
Hemoglobin: 13.2 g/dL (ref 12.0–15.0)
Immature Granulocytes: 0 %
Lymphocytes Relative: 23 %
Lymphs Abs: 1.3 10*3/uL (ref 0.7–4.0)
MCH: 31.5 pg (ref 26.0–34.0)
MCHC: 34 g/dL (ref 30.0–36.0)
MCV: 92.6 fL (ref 80.0–100.0)
Monocytes Absolute: 0.6 10*3/uL (ref 0.1–1.0)
Monocytes Relative: 11 %
Neutro Abs: 3.3 10*3/uL (ref 1.7–7.7)
Neutrophils Relative %: 63 %
Platelet Count: 145 10*3/uL — ABNORMAL LOW (ref 150–400)
RBC: 4.19 MIL/uL (ref 3.87–5.11)
RDW: 13.6 % (ref 11.5–15.5)
WBC Count: 5.4 10*3/uL (ref 4.0–10.5)
nRBC: 0 % (ref 0.0–0.2)

## 2021-12-23 MED ORDER — SODIUM CHLORIDE 0.9% FLUSH
10.0000 mL | Freq: Once | INTRAVENOUS | Status: AC
  Administered 2021-12-23: 10 mL

## 2021-12-23 MED ORDER — HEPARIN SOD (PORK) LOCK FLUSH 100 UNIT/ML IV SOLN
500.0000 [IU] | Freq: Once | INTRAVENOUS | Status: AC
  Administered 2021-12-23: 500 [IU]

## 2021-12-23 NOTE — Progress Notes (Signed)
HEMATOLOGY/ONCOLOGY CLINIC NOTE  Date of Service: 12/23/2021  Patient Care Team: [name], MD as PCP - General (Family Medicine) [name], [name], MD as PCP - Cardiology (Cardiology) [name], FNP as PCP - Family Medicine (Family Medicine) [name] [name], MD as Consulting Physician (Gastroenterology)  CHIEF COMPLAINTS/PURPOSE OF CONSULTATION:  Continued follow-up for evaluation and management of multiple myeloma  HISTORY OF PRESENTING ILLNESS:  Please see previous note for details on initial presentation  INTERVAL HISTORY Megan Velasquez is a 61 y.o. female is here for continued evaluation and management of her multiple myeloma. She reports She is doing well with no new symptoms or concerns.  She notes she has an angiogram scheduled with vascular surgery 12/24/2021. She notes she is tolerating Revlimid with no prohibitive toxicities. We discussed that due to her upcoming angiogram that she should hold off Revlimid for 5 days and start back 2-3 days following the procedure to reduce blood clot risk.  She reports pain in her right hip. She notes the pain is worsened on inclined surfaces but level ground reduces pain. We discussed some possible sources for this pain.  Labs done today were reviewed in detail with the patient.  MEDICAL HISTORY:  Past Medical History:  Diagnosis Date   Cancer ([place])    GERD (gastroesophageal reflux disease)    History of kidney stones    HTN (hypertension)    Multiple myeloma ([place])    Renal disorder     SURGICAL HISTORY: Past Surgical History:  Procedure Laterality Date   CHOLECYSTECTOMY     COLONOSCOPY  01/2015   Dr. Britta Mccreedy: Mild diverticulosis, sessile polyp ranging 3 to 5 mm removed from the proximal transverse colon, semi-pedunculated polyp 5 to 9 mm in size removed from the sigmoid colon.  Sigmoid colon polyp was serrated adenoma, transverse colon polyp was adenomatous.  Patient was told to have another colonoscopy in 3 years.    COLONOSCOPY WITH PROPOFOL N/A 10/11/2017   Procedure: COLONOSCOPY WITH PROPOFOL;  Surgeon: Daneil Dolin, MD;  [facility];  Service: Endoscopy;  Laterality: N/A;  1:15pm   IR IMAGING GUIDED PORT INSERTION  06/05/2020   POLYPECTOMY  10/11/2017   Procedure: POLYPECTOMY;  Surgeon: Daneil Dolin, MD;  [facility];  Service: Endoscopy;;  descending colon polyps cs times 2    SOCIAL HISTORY: Social History   Socioeconomic History   Marital status: Married    Spouse name: Not on file   Number of children: Not on file   Years of education: Not on file   Highest education level: Not on file  Occupational History   Not on file  Tobacco Use   Smoking status: Former    Packs/day: 1.00    Years: 28.00    Total pack years: 28.00    Types: Cigarettes    Quit date: 10/08/2013    Years since quitting: 8.2   Smokeless tobacco: Never  Vaping Use   Vaping Use: Never used  Substance and Sexual Activity   Alcohol use: Not Currently   Drug use: Never   Sexual activity: Yes    Birth control/protection: Post-menopausal  Other Topics Concern   Not on file  Social History Narrative   Not on file   Social Determinants of Health   Financial Resource Strain: Low Risk  (06/06/2020)   Overall Financial Resource Strain (CARDIA)    Difficulty of Paying Living Expenses: Not hard at all  Food Insecurity: No Food Insecurity (  06/06/2020)   Hunger Vital Sign    Worried About Running Out of Food in the Last Year: Never true    [place] in the Last Year: Never true  Transportation Needs: No Transportation Needs (06/06/2020)   PRAPARE - Hydrologist (Medical): No    Lack of Transportation (Non-Medical): No  Physical Activity: Not on file  Stress: No Stress Concern Present (06/06/2020)   [place]    Feeling of Stress : Only a little  Social Connections: Unknown (06/06/2020)   Social  Connection and Isolation Panel [NHANES]    Frequency of Communication with Friends and Family: More than three times a week    Frequency of Social Gatherings with Friends and Family: More than three times a week    Attends Religious Services: Not on Advertising copywriter or Organizations: Not on file    Attends Archivist Meetings: Not on file    Marital Status: Married  Human resources officer Violence: Not on file    FAMILY HISTORY: Family History  Problem Relation Age of Onset   Heart disease Mother    Diabetes Mother    Lung cancer Father    COPD Father    Colon cancer Neg Hx     ALLERGIES:  has No Known Allergies.  MEDICATIONS:  Current Outpatient Medications  Medication Sig Dispense Refill   acetaminophen (TYLENOL) 500 MG tablet Take 1,000 mg by mouth every 6 (six) hours as needed for fever or headache.     ADVAIR HFA 230-21 MCG/ACT inhaler Inhale 2 puffs into the lungs 2 (two) times daily.     apixaban (ELIQUIS) 2.5 MG TABS tablet Take 1 tablet (2.5 mg total) by mouth 2 (two) times daily. 60 tablet 5   Ascorbic Acid (VITAMIN C) 1000 MG tablet Take 1,000 mg by mouth every morning.     b complex vitamins capsule Take 1 capsule by mouth daily. (Patient taking differently: Take 1 capsule by mouth every morning.) 30 capsule 3   Cholecalciferol (VITAMIN D3) 5000 units CAPS Take 5,000 Units by mouth every morning.     Cyanocobalamin (VITAMIN B12) 3000 MCG SUBL Place 3,000 mcg under the tongue every morning.     enoxaparin (LOVENOX) 60 MG/0.6ML injection Inject 0.575 mLs (57.5 mg total) into the skin every 12 (twelve) hours for 2 days. 2.3 mL 0   loratadine (CLARITIN) 10 MG tablet Take 10 mg by mouth every morning.     omeprazole (PRILOSEC) 40 MG capsule Take 40 mg by mouth daily.     ondansetron (ZOFRAN) 8 MG tablet Take 1 tablet (8 mg total) by mouth 2 (two) times daily as needed (Nausea or vomiting). 30 tablet 1   REVLIMID 10 MG capsule TAKE 1 CAPSULE BY MOUTH DAILY  FOR 21 DAYS, THEN TAKE 7 DAYS OFF. REPEAT EVERY 28 DAYS 21 capsule 0   rosuvastatin (CRESTOR) 10 MG tablet Take 10 mg by mouth daily.     Saccharomyces boulardii 500 MG PACK Take 500 mg by mouth 2 (two) times daily at 8 am and 10 pm. 20 each 0   No current facility-administered medications for this visit.    REVIEW OF SYSTEMS:   10 Point review of Systems was done is negative except as noted above.  PHYSICAL EXAMINATION:  ECOG PERFORMANCE STATUS: 1 - Symptomatic but completely ambulatory .BP 132/61 (BP Location: Right Arm, Patient Position: Sitting)  Pulse 77   Temp 97.7 F (36.5 C) (Temporal)   Resp 14   Ht 5' 5"  (1.651 m)   Wt 128 lb 14.4 oz (58.5 kg)   SpO2 99%   BMI 21.45 kg/m  NAD GENERAL:alert, in no acute distress and comfortable SKIN: no acute rashes, no significant lesions EYES: conjunctiva are pink and non-injected, sclera anicteric NECK: supple, no JVD LYMPH:  no palpable lymphadenopathy in the cervical, axillary or inguinal regions LUNGS: clear to auscultation b/l with normal respiratory effort HEART: regular rate & rhythm ABDOMEN:  normoactive bowel sounds , non tender, not distended. Extremity: no pedal edema PSYCH: alert & oriented x 3 with fluent speech NEURO: no focal motor/sensory deficits  LABORATORY DATA:  I have reviewed the data as listed  .    Latest Ref Rng & Units 12/23/2021    8:51 AM 10/28/2021    9:46 AM 08/27/2021    8:54 AM  CBC  WBC 4.0 - 10.5 K/uL 5.4  5.7  3.5   Hemoglobin 12.0 - 15.0 g/dL 13.2  12.9  13.5   Hematocrit 36.0 - 46.0 % 38.8  37.3  39.0   Platelets 150 - 400 K/uL 145  141  149     .    Latest Ref Rng & Units 10/28/2021    9:46 AM 08/27/2021    8:54 AM 07/24/2021   10:50 AM  CMP  Glucose 70 - 99 mg/dL 120  123  101   BUN 8 - 23 mg/dL 16  19  23    Creatinine 0.44 - 1.00 mg/dL 0.95  0.96  0.96   Sodium 135 - 145 mmol/L 139  137  138   Potassium 3.5 - 5.1 mmol/L 3.6  3.7  4.0   Chloride 98 - 111 mmol/L 105  103  103    CO2 22 - 32 mmol/L 27  27  28    Calcium 8.9 - 10.3 mg/dL 8.7  8.9  9.4   Total Protein 6.5 - 8.1 g/dL 6.2  6.5  7.0   Total Bilirubin 0.3 - 1.2 mg/dL 0.7  0.8  0.6   Alkaline Phos 38 - 126 U/L 82  83  86   AST 15 - 41 U/L 15  16  16    ALT 0 - 44 U/L 13  14  14       Most recent lab results (03/15/2020) of CBC is as follows: all values are WNL except for RBC at 3.66, Hgb at 9.7, HCT at 31.8, MCHC at 30.5, RDW Standard Deviation 58.7, Red Cell Distribution Width At 18.6,  BUN at 19, Creatinine at 1.06, Total Protein at 10.2, Albumin at 2.7, AST at 14,. 03/13/2020 UPEP shows no M-Spike, all values are WNL 03/06/2020 SPEP shows Total Protein at 10.1, Beta Globulin at 5.2, M-Spike at 4.3, Total Globulin at 6.4, A/G Ratio at 0.6 03/06/2020 Free Kappa Light Chains at 181.1, Free Lambda Light Chains Serum at 3.6, K/L ratio at 50.31 03/06/2020 Beta-2 Microglobulin at 2.5           RADIOGRAPHIC STUDIES: I have personally reviewed the radiological images as listed and agreed with the findings in the report. VAS [place] ABI WITH/WO TBI  Result Date: 12/12/2021  LOWER EXTREMITY DOPPLER STUDY Patient Name:  ETSUKO DIEROLF  Date of Exam:   12/12/2021 Medical Rec #: [id number]     Accession #:    [id number] Date of Birth: [birth year]     Patient Gender: F Patient Age:   80 years  Exam Location:  [facility] Procedure:      VAS [place] ABI WITH/WO TBI Referring Phys: JOSHUA ROBINS --------------------------------------------------------------------------------  Indications: Peripheral artery disease. Blue toe syndrome. CTA in [place]:              moderate atherosclerotic disease at the aortic birfucation.  Performing Technologist: Ralene Cork RVT  Examination Guidelines: A complete evaluation includes at minimum, Doppler waveform signals and systolic blood pressure reading at the level of bilateral brachial, anterior tibial, and posterior tibial arteries, when vessel segments are accessible. Bilateral  testing is considered an integral part of a complete examination. Photoelectric Plethysmograph (PPG) waveforms and toe systolic pressure readings are included as required and additional duplex testing as needed. Limited examinations for reoccurring indications may be performed as noted.  ABI Findings: +---------+------------------+-----+----------+--------+ Right    Rt Pressure (mmHg)IndexWaveform  Comment  +---------+------------------+-----+----------+--------+ Brachial 100                                       +---------+------------------+-----+----------+--------+ PTA      55                0.52 monophasic         +---------+------------------+-----+----------+--------+ DP       39                0.37 monophasic         +---------+------------------+-----+----------+--------+ Great Toe                       Absent             +---------+------------------+-----+----------+--------+ +---------+------------------+-----+-----------+-------+ Left     Lt Pressure (mmHg)IndexWaveform   Comment +---------+------------------+-----+-----------+-------+ Brachial 106                                       +---------+------------------+-----+-----------+-------+ PTA      94                0.89 multiphasic        +---------+------------------+-----+-----------+-------+ DP       109               1.03 multiphasic        +---------+------------------+-----+-----------+-------+ Great Toe80                0.75                    +---------+------------------+-----+-----------+-------+ +-------+-----------+-----------+------------+------------+ ABI/TBIToday's ABIToday's TBIPrevious ABIPrevious TBI +-------+-----------+-----------+------------+------------+ Right  0.52       absent     0.56        absent       +-------+-----------+-----------+------------+------------+ Left   1.03       0.75       1.02        0.63          +-------+-----------+-----------+------------+------------+  Previous ABI 08/14/21.  Summary: Right: Resting right ankle-brachial index indicates moderate right lower extremity arterial disease. The right toe-brachial index is abnormal. Left: Resting left ankle-brachial index is within normal range. The left toe-brachial index is normal. *See table(s) above for measurements and observations.  Electronically signed by Orlie Pollen on 12/12/2021 at 4:30:43 PM.    Final     ASSESSMENT & PLAN:   61 year old female here for continued  follow-up, evaluation and management of multiple myeloma  #1  IgA kappa multiple myeloma status post induction treatment with carfilzomib Revlimid dexamethasone  presenting with anemia hemoglobin 9.4. Creatinine 1.28 No hypercalcemia Bone survey with no obvious skeletal lesions. Baseline M spike of 4.3 g/dL. Beta-2 Microglobulin at 2.5 Mol Cy Monosomy 13 and dup 1q  2) status post stem cell collection assisted with G-CSF and Plerixafor. Currently on maintenance Revlimid. Currently had declined proceeding with consolidative autologous HSCT  3) Diarrhea - ? Related to Revlimid.  Mild and nearly resolved GI panel and c diff neg30  PLAN: -Patient's labs from today were discussed in detail with the patient CBC within normal limits except platelets of 145k CMP stable. Myeloma panel pending. K/L FLC labs shows normal SFLC and ratio. Patient reports no significant toxicities from her current dose of Revlimid at this time. Patient has no symptom suggestive of disease progression -Due to her upcoming angiogram 12/24/2021, she will stop Revlimid for 5 days and start back 2-3 days following the procedure to reduce blood clot risk.   FOLLOW UP: RTC with Dr Irene Limbo with labs in 2 months  The total time spent in the appointment was 21 minutes*.  All of the patient's questions were answered with apparent satisfaction. The patient knows to call the clinic with any problems,  questions or concerns.   Sullivan Lone MD MS AAHIVMS [facility] Hematology/Oncology Physician [facility]  .*Total Encounter Time as defined by the Centers for Medicare and Medicaid Services includes, in addition to the face-to-face time of a patient visit (documented in the note above) non-face-to-face time: obtaining and reviewing outside history, ordering and reviewing medications, tests or procedures, care coordination (communications with other health care professionals or caregivers) and documentation in the medical record.  I, Melene Muller, am acting as scribe for Dr. Sullivan Lone, MD.  .I have reviewed the above documentation for accuracy and completeness, and I agree with the above.Brunetta Genera MD

## 2021-12-24 ENCOUNTER — Ambulatory Visit (HOSPITAL_COMMUNITY)
Admission: RE | Admit: 2021-12-24 | Discharge: 2021-12-24 | Disposition: A | Discharge status: Home / Self Care | Payer: BC Managed Care – PPO | Attending: Vascular Surgery | Admitting: Vascular Surgery

## 2021-12-24 ENCOUNTER — Other Ambulatory Visit: Payer: Self-pay

## 2021-12-24 ENCOUNTER — Encounter (HOSPITAL_COMMUNITY)
Admission: RE | Disposition: A | Discharge status: Home / Self Care | Payer: Self-pay | Source: Home / Self Care | Attending: Vascular Surgery

## 2021-12-24 DIAGNOSIS — Z87891 Personal history of nicotine dependence: Secondary | ICD-10-CM | POA: Diagnosis not present

## 2021-12-24 DIAGNOSIS — Z7901 Long term (current) use of anticoagulants: Secondary | ICD-10-CM | POA: Diagnosis not present

## 2021-12-24 DIAGNOSIS — Z79899 Other long term (current) drug therapy: Secondary | ICD-10-CM | POA: Diagnosis not present

## 2021-12-24 DIAGNOSIS — C9000 Multiple myeloma not having achieved remission: Secondary | ICD-10-CM | POA: Insufficient documentation

## 2021-12-24 DIAGNOSIS — I70211 Atherosclerosis of native arteries of extremities with intermittent claudication, right leg: Secondary | ICD-10-CM | POA: Insufficient documentation

## 2021-12-24 DIAGNOSIS — I708 Atherosclerosis of other arteries: Secondary | ICD-10-CM | POA: Diagnosis not present

## 2021-12-24 HISTORY — PX: PERIPHERAL VASCULAR INTERVENTION: CATH118257

## 2021-12-24 HISTORY — PX: ABDOMINAL AORTOGRAM W/LOWER EXTREMITY: CATH118223

## 2021-12-24 LAB — KAPPA/LAMBDA LIGHT CHAINS
Kappa free light chain: 14.5 mg/L (ref 3.3–19.4)
Kappa, lambda light chain ratio: 1.01 (ref 0.26–1.65)
Lambda free light chains: 14.4 mg/L (ref 5.7–26.3)

## 2021-12-24 SURGERY — ABDOMINAL AORTOGRAM W/LOWER EXTREMITY
Anesthesia: LOCAL

## 2021-12-24 MED ORDER — LABETALOL HCL 5 MG/ML IV SOLN: 10.0000 mg | INTRAVENOUS | Status: DC | PRN

## 2021-12-24 MED ORDER — HYDRALAZINE HCL 20 MG/ML IJ SOLN: 5.0000 mg | INTRAMUSCULAR | Status: DC | PRN

## 2021-12-24 MED ORDER — FENTANYL CITRATE (PF) 100 MCG/2ML IJ SOLN
INTRAMUSCULAR | Status: DC | PRN
  Administered 2021-12-24 (×2): 50 ug via INTRAVENOUS

## 2021-12-24 MED ORDER — HEPARIN SODIUM (PORCINE) 1000 UNIT/ML IJ SOLN
INTRAMUSCULAR | Status: AC
  Filled 2021-12-24: qty 10

## 2021-12-24 MED ORDER — FENTANYL CITRATE (PF) 100 MCG/2ML IJ SOLN
INTRAMUSCULAR | Status: AC
  Filled 2021-12-24: qty 2

## 2021-12-24 MED ORDER — SODIUM CHLORIDE 0.9 % WEIGHT BASED INFUSION: 1.0000 mL/kg/h | INTRAVENOUS | Status: DC

## 2021-12-24 MED ORDER — MIDAZOLAM HCL 2 MG/2ML IJ SOLN
INTRAMUSCULAR | Status: AC
  Filled 2021-12-24: qty 2

## 2021-12-24 MED ORDER — SODIUM CHLORIDE 0.9% FLUSH: 3.0000 mL | Freq: Two times a day (BID) | INTRAVENOUS | Status: DC

## 2021-12-24 MED ORDER — ASPIRIN 81 MG PO CHEW
CHEWABLE_TABLET | ORAL | Status: DC | PRN
  Administered 2021-12-24: 81 mg via ORAL

## 2021-12-24 MED ORDER — LIDOCAINE HCL (PF) 1 % IJ SOLN
INTRAMUSCULAR | Status: AC
  Filled 2021-12-24: qty 30

## 2021-12-24 MED ORDER — ACETAMINOPHEN 325 MG PO TABS: 650.0000 mg | ORAL_TABLET | ORAL | Status: DC | PRN

## 2021-12-24 MED ORDER — ONDANSETRON HCL 4 MG/2ML IJ SOLN: 4.0000 mg | Freq: Four times a day (QID) | INTRAMUSCULAR | Status: DC | PRN

## 2021-12-24 MED ORDER — SODIUM CHLORIDE 0.9% FLUSH: 3.0000 mL | INTRAVENOUS | Status: DC | PRN

## 2021-12-24 MED ORDER — HEPARIN (PORCINE) IN NACL 1000-0.9 UT/500ML-% IV SOLN
INTRAVENOUS | Status: DC | PRN
  Administered 2021-12-24: 1000 mL

## 2021-12-24 MED ORDER — HEPARIN SODIUM (PORCINE) 1000 UNIT/ML IJ SOLN
INTRAMUSCULAR | Status: DC | PRN
  Administered 2021-12-24: 6000 [IU] via INTRAVENOUS

## 2021-12-24 MED ORDER — MIDAZOLAM HCL 2 MG/2ML IJ SOLN
INTRAMUSCULAR | Status: DC | PRN
  Administered 2021-12-24 (×2): 1 mg via INTRAVENOUS

## 2021-12-24 MED ORDER — ASPIRIN 81 MG PO CHEW
CHEWABLE_TABLET | ORAL | Status: AC
  Filled 2021-12-24: qty 1

## 2021-12-24 MED ORDER — SODIUM CHLORIDE 0.9 % IV SOLN: INTRAVENOUS | Status: DC

## 2021-12-24 MED ORDER — CLOPIDOGREL BISULFATE 75 MG PO TABS: 75.0000 mg | ORAL_TABLET | Freq: Every day | ORAL | 11 refills | Status: DC

## 2021-12-24 MED ORDER — CLOPIDOGREL BISULFATE 300 MG PO TABS
ORAL_TABLET | ORAL | Status: DC | PRN
  Administered 2021-12-24: 300 mg via ORAL

## 2021-12-24 MED ORDER — CLOPIDOGREL BISULFATE 300 MG PO TABS
ORAL_TABLET | ORAL | Status: AC
  Filled 2021-12-24: qty 1

## 2021-12-24 MED ORDER — IODIXANOL 320 MG/ML IV SOLN
INTRAVENOUS | Status: DC | PRN
  Administered 2021-12-24: 165 mL via INTRA_ARTERIAL

## 2021-12-24 MED ORDER — SODIUM CHLORIDE 0.9 % IV SOLN: 250.0000 mL | INTRAVENOUS | Status: DC | PRN

## 2021-12-24 MED ORDER — LIDOCAINE HCL (PF) 1 % IJ SOLN
INTRAMUSCULAR | Status: DC | PRN
  Administered 2021-12-24: 22 mL

## 2021-12-24 MED ORDER — HEPARIN (PORCINE) IN NACL 1000-0.9 UT/500ML-% IV SOLN
INTRAVENOUS | Status: AC
  Filled 2021-12-24: qty 1000

## 2021-12-24 SURGICAL SUPPLY — 14 items
CATH OMNI FLUSH 5F 65CM (CATHETERS) IMPLANT
CLOSURE PERCLOSE PROSTYLE (VASCULAR PRODUCTS) IMPLANT
DEVICE CLOSURE MYNXGRIP 5F (Vascular Products) IMPLANT
KIT ENCORE 26 ADVANTAGE (KITS) IMPLANT
KIT MICROPUNCTURE NIT STIFF (SHEATH) IMPLANT
KIT PV (KITS) ×2 IMPLANT
SHEATH BRITE TIP 7FR 35CM (SHEATH) IMPLANT
SHEATH PINNACLE 5F 10CM (SHEATH) IMPLANT
SHEATH PROBE COVER 6X72 (BAG) IMPLANT
STENT VIABAHN 8X29X80 VBX (Permanent Stent) IMPLANT
SYR MEDRAD MARK 7 150ML (SYRINGE) ×2 IMPLANT
TRANSDUCER W/STOPCOCK (MISCELLANEOUS) ×2 IMPLANT
TRAY PV CATH (CUSTOM PROCEDURE TRAY) ×2 IMPLANT
WIRE BENTSON .035X145CM (WIRE) IMPLANT

## 2021-12-24 NOTE — H&P (Addendum)
[facility] [place] HISTORY AND PHYSICAL   Patient seen and examined in preop holding.  No complaints. Discoloration in the right foot has improved, not discolored at this time. In short, patient is a 61 year old female with known peripheral arterial disease and recent decrease in right-sided ABIs with discoloration of the toes. 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  Complicating her presentation is her history of multiple myeloma, treated with Revlimid-both of which lead to hypercoagulable state.  She has managed with Eliquis 2.5 twice daily to combat this.  CT scan was reviewed demonstrating 2 areas of concern, the terminal aorta and common iliac arteries, as well as the right popliteal artery.  Had a long discussion with Jocelyn Lamer regarding the above.  She would benefit from angiography in an effort to define and possibly treat the lesions that have caused embolic episodes.  Being that her symptoms have improved, and she only has mild claudication in the right hip, I do not plan on being aggressive as stenting has a higher thrombosis rate due to multiple myeloma and medication associated.  We will continue to manage her with Eliquis moving forward. Current symptoms right thigh claudication, no rest pain, no tissue loss Nonpalpable right femoral pulse  After discussing the risks and benefits of right leg angiogram with possible intervention, Shaia Porath elected to proceed.   Broadus John MD  _________________________    History of Present Illness:  Patient is a 61 y.o. year old female who presents for evaluation of PAD with ecchymosis appearing right GT for 2 1/2 months at presentation.    She denied symptoms of claudication, ischemic rest pain, tissue loss.  She has known right LE peripheral; artery disease without palpable femoral or predial pulses.   Dr. Virl Cagey recommended she be medially managed with Statin.  Should she have  another episode or mottling worsen, we would pursue bilateral lower extremity angiography and likely stent over the iliac lesion.   She is followed by Dr. [name] cardiology.  She states she has had increase incidence of pain in her right foot/toes that requires her to get out of bed to ambulate for the pain to subside.  She denies frank claudication or non healing wounds.  She continues to have blue toes.  She has on going cancer treatment with Revlimd as well as Eliquis 2.5 mg BID to prevent DVT due to the previous noted medication.  She has continued to take Crestor daily as well for maximum medical management.       Past Medical History:  Diagnosis Date   Cancer ([place])    GERD (gastroesophageal reflux disease)    History of kidney stones    HTN (hypertension)    Multiple myeloma ([place])    Renal disorder     Past Surgical History:  Procedure Laterality Date   CHOLECYSTECTOMY     COLONOSCOPY  01/2015   Dr. Britta Mccreedy: Mild diverticulosis, sessile polyp ranging 3 to 5 mm removed from the proximal transverse colon, semi-pedunculated polyp 5 to 9 mm in size removed from the sigmoid colon.  Sigmoid colon polyp was serrated adenoma, transverse colon polyp was adenomatous.  Patient was told to have another colonoscopy in 3 years.   COLONOSCOPY WITH PROPOFOL N/A 10/11/2017   Procedure: COLONOSCOPY WITH PROPOFOL;  Surgeon: Daneil Dolin, MD;  Location: [facility];  Service: Endoscopy;  Laterality: N/A;  1:15pm   IR IMAGING GUIDED PORT INSERTION  06/05/2020   POLYPECTOMY  10/11/2017   Procedure: POLYPECTOMY;  Surgeon: Daneil Dolin, MD;  Location: [facility];  Service: Endoscopy;;  descending colon polyps cs times 2    ROS:   General:  No weight loss, Fever, chills  HEENT: No recent headaches, no nasal bleeding, no visual changes, no sore throat  Neurologic: No dizziness, blackouts, seizures. No recent symptoms of stroke or mini- stroke. No recent episodes of slurred speech, or temporary  blindness.  Cardiac: No recent episodes of chest pain/pressure, no shortness of breath at rest.  No shortness of breath with exertion.  Denies history of atrial fibrillation or irregular heartbeat  Vascular: positive history of rest pain in feet.  No history of claudication.  No history of non-healing ulcer, No history of DVT   Pulmonary: No home oxygen, no productive cough, no hemoptysis,  No asthma or wheezing  Musculoskeletal:  _0  Arthritis, _1  Low back pain,  _2  Joint pain  Hematologic:No history of hypercoagulable state.  No history of easy bleeding.  No history of anemia  Gastrointestinal: No hematochezia or melena,  No gastroesophageal reflux, no trouble swallowing  Urinary: _3  chronic Kidney disease, _4  on HD - _5  MWF or _6  TTHS, _7  Burning with urination, _8  Frequent urination, _9  Difficulty urinating;   Skin: No rashes  Psychological: No history of anxiety,  No history of depression  Social History Social History   Tobacco Use   Smoking status: Former    Packs/day: 1.00    Years: 28.00    Total pack years: 28.00    Types: Cigarettes    Quit date: 10/08/2013    Years since quitting: 8.2   Smokeless tobacco: Never  Vaping Use   Vaping Use: Never used  Substance Use Topics   Alcohol use: Not Currently   Drug use: Never    Family History Family History  Problem Relation Age of Onset   Heart disease Mother    Diabetes Mother    Lung cancer Father    COPD Father    Colon cancer Neg Hx     Allergies  No Known Allergies   Current Facility-Administered Medications  Medication Dose Route Frequency Provider Last Rate Last Admin   0.9 %  sodium chloride infusion   Intravenous Continuous Broadus John, MD 100 mL/hr at 12/24/21 0747 New Bag at 12/24/21 0747    Physical Examination  Vitals:   12/24/21 0734  BP: 127/76  Pulse: 67  Resp: 12  Temp: (!) 96.7 F (35.9 C)  TempSrc: Temporal  SpO2: 100%  Weight: 58.1 kg  Height: _10  (1.651 m)     Body mass index is 21.3 kg/m.  General:  Alert and oriented, no acute distress HEENT: Normal Neck: No bruit or JVD Pulmonary: Clear to auscultation bilaterally Cardiac: Regular Rate and Rhythm without murmur Abdomen: Soft, non-tender, non-distended, no mass, no scars Skin: No rash, right toes "blue" discoloration Extremity Pulses: palpable left femoral pulse, no pedal right pulses Musculoskeletal: No deformity or edema  Neurologic: Upper and lower extremity motor 5/5 and symmetric  DATA:      ABI Findings:  +---------+------------------+-----+----------+--------+  Right    Rt Pressure (mmHg)IndexWaveform  Comment   +---------+------------------+-----+----------+--------+  Brachial 100                                        +---------+------------------+-----+----------+--------+  PTA  55                0.52 monophasic          +---------+------------------+-----+----------+--------+  DP       39                0.37 monophasic          +---------+------------------+-----+----------+--------+  Great Toe                       Absent              +---------+------------------+-----+----------+--------+   +---------+------------------+-----+-----------+-------+  Left     Lt Pressure (mmHg)IndexWaveform   Comment  +---------+------------------+-----+-----------+-------+  Brachial 106                                        +---------+------------------+-----+-----------+-------+  PTA      94                0.89 multiphasic         +---------+------------------+-----+-----------+-------+  DP       109               1.03 multiphasic         +---------+------------------+-----+-----------+-------+  Great Toe80                0.75                     +---------+------------------+-----+-----------+-------+   +-------+-----------+-----------+------------+------------+  ABI/TBIToday's ABIToday's TBIPrevious  ABIPrevious TBI  +-------+-----------+-----------+------------+------------+  Right  0.52       absent     0.56        absent        +-------+-----------+-----------+------------+------------+  Left   1.03       0.75       1.02        0.63          +-------+-----------+-----------+------------+------------+    Previous ABI 08/14/21.     Summary:  Right: Resting right ankle-brachial index indicates moderate right lower  extremity arterial disease. The right toe-brachial index is abnormal.   Left: Resting left ankle-brachial index is within normal range. The left  toe-brachial index is normal.    Reviewed by Dr. Virl Cagey 09/05/21  CT angiogram abdomen pelvis with runoff. 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  There is some mild disease in the right lower extremity, nonflow limiting otherwise.  ASSESSMENT/PLAN:  Right LE blue toes syndrome  ABI's today showed decreased wave form now monophasic wave forms from previous biphasic wave forms   PAD with right LE CT angiogram atherosclerotic disease at the aortic bifurcation.  There is some mild disease in the right lower extremity, nonflow limiting otherwise.  She has increased rest pain at night with decreased wave forms on ABI's I will set her up for angiogram with right LE runoff and possible intervention.  I have asked that Dr. Virl Cagey be evolved in planning secondary to her Cancer treatment with additional needs of Eliquis to protect from blood clotting events.  She and her husband agree to proceed.   Broadus John PA-C [facility] [place] Office: [phone]  MD on call Virl Cagey

## 2021-12-24 NOTE — Discharge Instructions (Signed)
Femoral Site Care This sheet gives you information about how to care for yourself after your procedure. Your health care provider may also give you more specific instructions. If you have problems or questions, contact your health care provider. What can I expect after the procedure?  After the procedure, it is common to have: Bruising that usually fades within 1-2 weeks. Tenderness at the site. Follow these instructions at home: Wound care Follow instructions from your health care provider about how to take care of your insertion site. Make sure you: Wash your hands with soap and water before you change your bandage (dressing). If soap and water are not available, use hand sanitizer. Remove your dressing as told by your health care provider. In 24 hours Do not take baths, swim, or use a hot tub until your health care provider approves. You may shower 24-48 hours after the procedure or as told by your health care provider. Gently wash the site with plain soap and water. Pat the area dry with a clean towel. Do not rub the site. This may cause bleeding. Do not apply powder or lotion to the site. Keep the site clean and dry. Check your femoral site every day for signs of infection. Check for: Redness, swelling, or pain. Fluid or blood. Warmth. Pus or a bad smell. Activity For the first 2-3 days after your procedure, or as long as directed: Avoid climbing stairs as much as possible. Do not squat. Do not lift anything that is heavier than 10 lb (4.5 kg), or the limit that you are told, until your health care provider says that it is safe. For 5 days Rest as directed. Avoid sitting for a long time without moving. Get up to take short walks every 1-2 hours. Do not drive for 24 hours if you were given a medicine to help you relax (sedative). General instructions Take over-the-counter and prescription medicines only as told by your health care provider. Keep all follow-up visits as told by  your health care provider. This is important. Contact a health care provider if you have: A fever or chills. You have redness, swelling, or pain around your insertion site. Get help right away if: The catheter insertion area swells very fast. You pass out. You suddenly start to sweat or your skin gets clammy. The catheter insertion area is bleeding, and the bleeding does not stop when you hold steady pressure for 20 minutes on the area. The area near or just beyond the catheter insertion site becomes pale, cool, tingly, or numb. These symptoms may represent a serious problem that is an emergency. Do not wait to see if the symptoms will go away. Get medical help right away. Call your local emergency services (911 in the [place] Do not drive yourself to the hospital. Summary After the procedure, it is common to have bruising that usually fades within 1-2 weeks. Check your femoral site every day for signs of infection. Do not lift anything that is heavier than 10 lb (4.5 kg), or the limit that you are told, until your health care provider says that it is safe. This information is not intended to replace advice given to you by your health care provider. Make sure you discuss any questions you have with your health care provider. Document Revised: 04/05/2017 Document Reviewed: 04/05/2017 Elsevier Patient Education  2020 [employer].

## 2021-12-24 NOTE — Progress Notes (Signed)
Patient to ambulate. Voided qs. Returned to room, no bleeding noted after ambulation.

## 2021-12-24 NOTE — Op Note (Signed)
Patient name: Megan Velasquez MRN: [id number] DOB: 22-Apr-1960 Sex: female  12/24/2021 Pre-operative Diagnosis: Right lower extremity atheroembolic event Post-operative diagnosis:  Same Surgeon:  Broadus John, MD Procedure Performed: 1.  Bilateral common femoral artery access using an ultrasound-guided micropuncture needle 2.  Aortogram 3.  Bilateral lower extremity angiogram 4.  Right common iliac artery 8 x 7m Gore VBX stent 5.  Right-sided groin closure-Pro-glide 6.  Left-sided groin closure-Mynx 7.  Moderate sedation time 50 minutes 8.  Contrast volume 165 mL   Indications:  Patient seen and examined in preop holding.  No complaints. Discoloration in the right foot has improved, not discolored at this time. In short, patient is a 61year old female with known peripheral arterial disease and recent decrease in right-sided ABIs with discoloration of the toes. 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  Complicating her presentation is her history of multiple myeloma, treated with Revlimid-both of which lead to hypercoagulable state.  She has managed with Eliquis 2.5 twice daily to combat this.  CT scan was reviewed demonstrating 2 areas of concern, the terminal aorta and common iliac arteries, as well as the right popliteal artery.   Had a long discussion with [name].  She would benefit from angiography in an effort to define and possibly treat the lesions that have caused embolic episodes.  Being that her symptoms have improved, and she only has mild claudication in the right hip, I do not plan on being aggressive as stenting has a higher thrombosis rate due to multiple myeloma and medication associated.  We will continue to manage her with Eliquis moving forward. Current symptoms right thigh claudication, no rest pain, no tissue loss Nonpalpable right femoral pulse   After discussing the risks and benefits of right leg angiogram  with possible intervention, [name].   Findings:  Aortogram: Bilateral renal arteries patent, distal aorta without flow-limiting stenosis.  Greater than 90% critical right-sided common iliac artery stenosis.  Left iliac system widely patent.  Bilateral hypogastric arteries patent.  On the right: No flow-limiting stenosis appreciated in the common femoral artery, profunda, superficial femoral artery, popliteal artery.  Two-vessel runoff to the foot.  Anterior tibial, peroneal arteries with a large collateral to the posterior tibial artery.  The posterior tibial artery above the ankle is not visualized.  This could be a variant anatomy.  On the left: No flow-limiting stenosis appreciated in the common femoral artery, profunda, superficial femoral artery, popliteal artery.  Three-vessel runoff to the foot.   Procedure:  The patient was identified in the holding area and taken to room 8.  The patient was then placed supine on the table and prepped and draped in the usual sterile fashion.  A time out was called.  Ultrasound was used to evaluate the left common femoral artery.  It was patent .  A digital ultrasound image was acquired.  A micropuncture needle was used to access the left common femoral artery under ultrasound guidance.  An 018 wire was advanced without resistance and a micropuncture sheath was placed.  The 018 wire was removed and a benson wire was placed.  The micropuncture sheath was exchanged for a 5 french sheath.  An omniflush catheter was advanced over the wire to the level of L-1.  An abdominal angiogram was obtained.  Next, using the omniflush catheter was brought to the aortic bifurcation for bilateral lower extremity angiogram.  I elected to intervene on  the right-sided common iliac artery lesion.  Using ultrasound-guided micropuncture needle to access the right common femoral artery in retrograde fashion.  From this, a wire was run into the abdominal aorta,  followed by a 7 [place] sheath.  The patient was heparinized with 6000 units IV heparin.  Aortogram with magnification at the terminal aorta followed from the left groin.  An 8 x 29 mm Gore VBX stent was brought onto the field and placed across the lesion this was deployed immediately following the aortogram and while the patient held her breath in an effort to limit migration.  Follow-up angiography demonstrated an excellent result with resolution of the flow-limiting stenosis.  Impression: Successful stenting of critically stenosed right common iliac artery using an 8 x 29 mm Gore VBX.  This was likely the nidus of previous atheroembolic events.  Plan: Will see the patient in 1 month with abdominal duplex ultrasound to interrogate right-sided stent, as well as ABI.  Megan Santee, MD [facility] [place] Office: [phone]

## 2021-12-25 ENCOUNTER — Encounter (HOSPITAL_COMMUNITY): Payer: Self-pay | Admitting: Vascular Surgery

## 2021-12-27 ENCOUNTER — Telehealth: Payer: Self-pay | Admitting: Vascular Surgery

## 2021-12-27 NOTE — Telephone Encounter (Signed)
Received a phone call from Megan Velasquez about her recent procedure. She describes some swelling and tenderness about the groin near her access site. She has ecchymosis below the access.   I suspect she has a hematoma. She describes no pulsatility in the groin. I counseled her to present to the ER if she feels worse, or develops skin changes or groin pulsatility. Otherwise, we will see her on Monday in the office for an evaluation. I sent a message to the office to arrange for again.  [name]. Stanford Breed, MD [facility] Phone Number: [phone] 12/27/2021 10:34 AM

## 2021-12-28 ENCOUNTER — Emergency Department (HOSPITAL_COMMUNITY)
Admission: EM | Admit: 2021-12-28 | Discharge: 2021-12-28 | Disposition: A | Discharge status: Home / Self Care | Payer: BC Managed Care – PPO | Attending: Emergency Medicine | Admitting: Emergency Medicine

## 2021-12-28 ENCOUNTER — Emergency Department (HOSPITAL_BASED_OUTPATIENT_CLINIC_OR_DEPARTMENT_OTHER): Payer: BC Managed Care – PPO

## 2021-12-28 ENCOUNTER — Encounter (HOSPITAL_COMMUNITY): Payer: Self-pay | Admitting: Emergency Medicine

## 2021-12-28 ENCOUNTER — Other Ambulatory Visit: Payer: Self-pay

## 2021-12-28 DIAGNOSIS — R1909 Other intra-abdominal and pelvic swelling, mass and lump: Secondary | ICD-10-CM

## 2021-12-28 DIAGNOSIS — I724 Aneurysm of artery of lower extremity: Secondary | ICD-10-CM

## 2021-12-28 DIAGNOSIS — R2240 Localized swelling, mass and lump, unspecified lower limb: Secondary | ICD-10-CM | POA: Insufficient documentation

## 2021-12-28 LAB — COMPREHENSIVE METABOLIC PANEL
ALT: 22 U/L (ref 0–44)
AST: 23 U/L (ref 15–41)
Albumin: 3.2 g/dL — ABNORMAL LOW (ref 3.5–5.0)
Alkaline Phosphatase: 58 U/L (ref 38–126)
Anion gap: 10 (ref 5–15)
BUN: 11 mg/dL (ref 8–23)
CO2: 27 mmol/L (ref 22–32)
Calcium: 8.6 mg/dL — ABNORMAL LOW (ref 8.9–10.3)
Chloride: 105 mmol/L (ref 98–111)
Creatinine, Ser: 1.03 mg/dL — ABNORMAL HIGH (ref 0.44–1.00)
GFR, Estimated: >60 mL/min (ref >60)
Glucose, Bld: 113 mg/dL — ABNORMAL HIGH (ref 70–99)
Potassium: 3 mmol/L — ABNORMAL LOW (ref 3.5–5.1)
Sodium: 142 mmol/L (ref 135–145)
Total Bilirubin: 0.6 mg/dL (ref 0.3–1.2)
Total Protein: 6.1 g/dL — ABNORMAL LOW (ref 6.5–8.1)

## 2021-12-28 LAB — CBC WITH DIFFERENTIAL/PLATELET
Abs Immature Granulocytes: 0.01 10*3/uL (ref 0.00–0.07)
Basophils Absolute: 0 10*3/uL (ref 0.0–0.1)
Basophils Relative: 1 %
Eosinophils Absolute: 0.1 10*3/uL (ref 0.0–0.5)
Eosinophils Relative: 2 %
HCT: 34.6 % — ABNORMAL LOW (ref 36.0–46.0)
Hemoglobin: 11.8 g/dL — ABNORMAL LOW (ref 12.0–15.0)
Immature Granulocytes: 0 %
Lymphocytes Relative: 16 %
Lymphs Abs: 0.9 10*3/uL (ref 0.7–4.0)
MCH: 32 pg (ref 26.0–34.0)
MCHC: 34.1 g/dL (ref 30.0–36.0)
MCV: 93.8 fL (ref 80.0–100.0)
Monocytes Absolute: 1 10*3/uL (ref 0.1–1.0)
Monocytes Relative: 16 %
Neutro Abs: 3.9 10*3/uL (ref 1.7–7.7)
Neutrophils Relative %: 65 %
Platelets: 148 10*3/uL — ABNORMAL LOW (ref 150–400)
RBC: 3.69 MIL/uL — ABNORMAL LOW (ref 3.87–5.11)
RDW: 13.6 % (ref 11.5–15.5)
WBC: 5.9 10*3/uL (ref 4.0–10.5)
nRBC: 0 % (ref 0.0–0.2)

## 2021-12-28 LAB — LACTIC ACID, PLASMA: Lactic Acid, Venous: 1.6 mmol/L (ref 0.5–1.9)

## 2021-12-28 MED ORDER — OXYCODONE HCL 5 MG PO TABS
5.0000 mg | ORAL_TABLET | Freq: Once | ORAL | Status: AC
  Administered 2021-12-28: 5 mg via ORAL
  Filled 2021-12-28: qty 1

## 2021-12-28 MED ORDER — MORPHINE SULFATE 15 MG PO TABS: 7.5000 mg | ORAL_TABLET | ORAL | 0 refills | Status: DC | PRN

## 2021-12-28 MED ORDER — ACETAMINOPHEN 500 MG PO TABS
1000.0000 mg | ORAL_TABLET | Freq: Once | ORAL | Status: AC
  Administered 2021-12-28: 1000 mg via ORAL
  Filled 2021-12-28: qty 2

## 2021-12-28 NOTE — ED Provider Notes (Signed)
[facility] EMERGENCY DEPARTMENT Provider Note   CSN: [id number] Arrival date & time: 12/28/21  1239     History  Chief Complaint  Patient presents with   Groin Swelling    Genieve Ramaswamy is a 61 y.o. female.  61 yo F with a chief complaint of pain and swelling to her left groin.  She had a angiogram and stents placed about 5 days ago by Dr. Unk Lightning, vascular.  She had had worsening symptoms and so came to the emergency department for evaluation.  Has actually already been seen by Dr. Stanford Breed who is planning on obtaining a vascular ultrasound of the left groin to assess for pseudoaneurysm.  Patient had a low-grade fever at home.  Otherwise denies cough congestion abdominal pain urinary symptoms rash.        Home Medications Prior to Admission medications   Medication Sig Start Date End Date Taking? Authorizing Provider  morphine (MSIR) 15 MG tablet Take 0.5 tablets (7.5 mg total) by mouth every 4 (four) hours as needed for severe pain. 12/28/21  Yes Deno Etienne, DO  acetaminophen (TYLENOL) 500 MG tablet Take 1,000 mg by mouth every 6 (six) hours as needed for fever or headache.    [provider]  ADVAIR HFA 230-21 MCG/ACT inhaler Inhale 2 puffs into the lungs 2 (two) times daily. 03/24/21   [provider]  apixaban (ELIQUIS) 2.5 MG TABS tablet Take 1 tablet (2.5 mg total) by mouth 2 (two) times daily. 07/24/21   Brunetta Genera, MD  Ascorbic Acid (VITAMIN C) 1000 MG tablet Take 1,000 mg by mouth every morning.    [provider]  b complex vitamins capsule Take 1 capsule by mouth daily. Patient taking differently: Take 1 capsule by mouth every morning. 04/25/20   Brunetta Genera, MD  Cholecalciferol (VITAMIN D3) 5000 units CAPS Take 5,000 Units by mouth every morning.    [provider]  clopidogrel (PLAVIX) 75 MG tablet Take 1 tablet (75 mg total) by mouth daily. 12/24/21 12/24/22  Broadus John, MD  Cyanocobalamin  (VITAMIN B12) 3000 MCG SUBL Place 3,000 mcg under the tongue every morning.    [provider]  enoxaparin (LOVENOX) 60 MG/0.6ML injection Inject 0.575 mLs (57.5 mg total) into the skin every 12 (twelve) hours for 2 days. 12/22/21 12/24/21  Broadus John, MD  lisinopril-hydrochlorothiazide (ZESTORETIC) 10-12.5 MG tablet Take 1 tablet by mouth daily. 12/22/21   [provider]  loratadine (CLARITIN) 10 MG tablet Take 10 mg by mouth every morning.    [provider]  omeprazole (PRILOSEC) 40 MG capsule Take 40 mg by mouth daily as needed (acid reflux). 01/31/20   [provider]  ondansetron (ZOFRAN) 8 MG tablet Take 1 tablet (8 mg total) by mouth 2 (two) times daily as needed (Nausea or vomiting). 08/27/21   Brunetta Genera, MD  REVLIMID 10 MG capsule TAKE 1 CAPSULE BY MOUTH DAILY FOR 21 DAYS, THEN TAKE 7 DAYS OFF. REPEAT EVERY 28 DAYS 12/02/21   Brunetta Genera, MD  rosuvastatin (CRESTOR) 10 MG tablet Take 10 mg by mouth daily. 09/18/21   [provider]  Saccharomyces boulardii 500 MG PACK Take 500 mg by mouth 2 (two) times daily at 8 am and 10 pm. Patient not taking: Reported on 12/23/2021 07/24/21   Brunetta Genera, MD      Allergies    Patient has no known allergies.    Review of Systems   Review of  Systems  Physical Exam Updated Vital Signs BP 112/60   Pulse 70   Temp 99.3 F (37.4 C) (Oral)   Resp 16   Ht '5\' 5"'$  (1.651 m)   Wt 58.1 kg   SpO2 98%   BMI 21.30 kg/m  Physical Exam Vitals and nursing note reviewed.  Constitutional:      General: She is not in acute distress.    Appearance: She is well-developed. She is not diaphoretic.  HENT:     Head: Normocephalic and atraumatic.  Eyes:     Pupils: Pupils are equal, round, and reactive to light.  Cardiovascular:     Rate and Rhythm: Normal rate and regular rhythm.     Heart sounds: No murmur heard.    No friction rub. No gallop.  Pulmonary:     Effort: Pulmonary  effort is normal.     Breath sounds: No wheezing or rales.  Abdominal:     General: There is no distension.     Palpations: Abdomen is soft.     Tenderness: There is no abdominal tenderness.  Musculoskeletal:        General: No tenderness.     Cervical back: Normal range of motion and neck supple.     Comments: Pulses intact to the dorsalis pedis bilaterally.  She has some pain and fullness to the left groin with a quite large hematoma.  Skin:    General: Skin is warm and dry.  Neurological:     Mental Status: She is alert and oriented to person, place, and time.  Psychiatric:        Behavior: Behavior normal.     ED Results / Procedures / Treatments   Labs (all labs ordered are listed, but only abnormal results are displayed) Labs Reviewed  COMPREHENSIVE METABOLIC PANEL - Abnormal; Notable for the following components:      Result Value   Potassium 3.0 (*)    Glucose, Bld 113 (*)    Creatinine, Ser 1.03 (*)    Calcium 8.6 (*)    Total Protein 6.1 (*)    Albumin 3.2 (*)    All other components within normal limits  CBC WITH DIFFERENTIAL/PLATELET - Abnormal; Notable for the following components:   RBC 3.69 (*)    Hemoglobin 11.8 (*)    HCT 34.6 (*)    Platelets 148 (*)    All other components within normal limits  LACTIC ACID, PLASMA    EKG None  Radiology VAS [place] GROIN PSEUDOANEURYSM  Result Date: 12/28/2021  ARTERIAL PSEUDOANEURYSM  Patient Name:  TIKITA MABEE  Date of Exam:   12/28/2021 Medical Rec #: [id number]     Accession #:    [id number] Date of Birth: July 09, 1960     Patient Gender: F Patient Age:   60 years Exam Location:  [facility] Procedure:      VAS [place] GROIN PSEUDOANEURYSM Referring Phys: Jamelle Haring --------------------------------------------------------------------------------  Exam: Left groin Indications: Patient complains of groin pain, palpable knot and bruising. 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 Comparison Study: No prior study on file Performing Technologist: Sharion Dove RVS  Examination Guidelines: A complete evaluation includes B-mode imaging, spectral Doppler, color Doppler, and power Doppler as needed of all accessible portions of each vessel. Bilateral testing is considered an integral part of a complete examination. Limited examinations for reoccurring indications may be performed as noted.  Findings: A mixed echogenic structure measuring approximately 2.2 cm x 0.9 cm is visualized at the  left groin at site of knot with ultrasound characteristics of a hematoma.  Summary: No evidence of pseudoaneurysm, AVF or DVT    --------------------------------------------------------------------------------    Preliminary     Procedures Procedures    Medications Ordered in ED Medications  acetaminophen (TYLENOL) tablet 1,000 mg (1,000 mg Oral Given 12/28/21 1724)  oxyCODONE (Oxy IR/ROXICODONE) immediate release tablet 5 mg (5 mg Oral Given 12/28/21 1724)    ED Course/ Medical Decision Making/ A&P                           Medical Decision Making Amount and/or Complexity of Data Reviewed Labs: ordered.  Risk OTC drugs. Prescription drug management.   61 yo F with a cc of left groin pain and swelling after having a aortogram and stent placement by vascular surgery about 4 days ago.   Ultrasounds negative for pseudoaneurysm.    Prescribe short course of pain medicine.  Have her call her vascular surgeon in the morning.  7:08 PM:  I have discussed the diagnosis/risks/treatment options with the patient and family.  Evaluation and diagnostic testing in the emergency department does not suggest an emergent condition requiring admission or immediate intervention beyond what has been performed at this time.  They will follow up with  PCP. We also discussed returning to the ED immediately if new or worsening sx occur. We discussed the sx which are most concerning (e.g., sudden  worsening pain, fever, inability to tolerate by mouth) that necessitate immediate return. Medications administered to the patient during their visit and any new prescriptions provided to the patient are listed below.  Medications given during this visit Medications  acetaminophen (TYLENOL) tablet 1,000 mg (1,000 mg Oral Given 12/28/21 1724)  oxyCODONE (Oxy IR/ROXICODONE) immediate release tablet 5 mg (5 mg Oral Given 12/28/21 1724)     The patient appears reasonably screen and/or stabilized for discharge and I doubt any other medical condition or other [facility] requiring further screening, evaluation, or treatment in the ED at this time prior to discharge.         Final Clinical Impression(s) / ED Diagnoses Final diagnoses:  Groin swelling    Rx / DC Orders ED Discharge Orders          Ordered    morphine (MSIR) 15 MG tablet  Every 4 hours PRN        12/28/21 [place], Shara Hartis, DO 12/28/21 1908

## 2021-12-28 NOTE — Consult Note (Addendum)
[facility] [place]  ASSESSMENT / PLAN: 61 y.o. female with left groin hematoma after percutaneous intervention 12/24/2021.  This is been very symptomatic to her, prompting her presentation today.  Her clinical exam is reassuring.  I do not feel any pulsatile flow around the area of tenderness in the groin.  Nevertheless, I will check a vascular ultrasound to evaluate for possible pseudoaneurysm.   CHIEF COMPLAINT: Left groin pain, nausea and vomiting  HISTORY OF PRESENT ILLNESS: Megan Velasquez is a 61 y.o. female who presents to [facility], ER for evaluation of worsening left groin pain.  The patient underwent left common iliac artery stenting with Dr. Unk Lightning on 10/01/3149 for atheroembolic disease.  Good technical result was achieved.  Both groins were accessed.  Both groins were closed percutaneously.  The patient called yesterday reporting tenderness and swelling in the left groin with ecchymosis tracking down the thigh.  She felt good at that time and so I counseled her to follow-up with [facility] on an outpatient basis on Monday.  She worsened overnight and started developing nausea and vomiting.  She elected to present to care today.  Overall, she feels weak.  She has discomfort in her left groin.  Past Medical History:  Diagnosis Date   Cancer ([place])    GERD (gastroesophageal reflux disease)    History of kidney stones    HTN (hypertension)    Multiple myeloma ([place])    Renal disorder     Past Surgical History:  Procedure Laterality Date   ABDOMINAL AORTOGRAM W/LOWER EXTREMITY N/A 12/24/2021   Procedure: ABDOMINAL AORTOGRAM W/LOWER EXTREMITY;  Surgeon: Broadus John, MD;  Location: [facility];  Service: Cardiovascular;  Laterality: N/A;   CHOLECYSTECTOMY     COLONOSCOPY  01/2015   Dr. Britta Mccreedy: Mild diverticulosis, sessile polyp ranging 3 to 5 mm removed from the proximal transverse colon, semi-pedunculated polyp 5 to 9 mm in size removed from the sigmoid colon.   Sigmoid colon polyp was serrated adenoma, transverse colon polyp was adenomatous.  Patient was told to have another colonoscopy in 3 years.   COLONOSCOPY WITH PROPOFOL N/A 10/11/2017   Procedure: COLONOSCOPY WITH PROPOFOL;  Surgeon: Daneil Dolin, MD;  [facility];  Service: Endoscopy;  Laterality: N/A;  1:15pm   IR IMAGING GUIDED PORT INSERTION  06/05/2020   PERIPHERAL VASCULAR INTERVENTION  12/24/2021   Procedure: PERIPHERAL VASCULAR INTERVENTION;  Surgeon: Broadus John, MD;  Location: [facility];  Service: Cardiovascular;;   POLYPECTOMY  10/11/2017   Procedure: POLYPECTOMY;  Surgeon: Daneil Dolin, MD;  [facility];  Service: Endoscopy;;  descending colon polyps cs times 2    Family History  Problem Relation Age of Onset   Heart disease Mother    Diabetes Mother    Lung cancer Father    COPD Father    Colon cancer Neg Hx     Social History   Socioeconomic History   Marital status: Married    Spouse name: Not on file   Number of children: Not on file   Years of education: Not on file   Highest education level: Not on file  Occupational History   Not on file  Tobacco Use   Smoking status: Former    Packs/day: 1.00    Years: 28.00    Total pack years: 28.00    Types: Cigarettes    Quit date: 10/08/2013    Years since quitting: 8.2   Smokeless tobacco:  Never  Vaping Use   Vaping Use: Never used  Substance and Sexual Activity   Alcohol use: Not Currently   Drug use: Never   Sexual activity: Yes    Birth control/protection: Post-menopausal  Other Topics Concern   Not on file  Social History Narrative   Not on file   Social Determinants of Health   Financial Resource Strain: Low Risk  (06/06/2020)   Overall Financial Resource Strain (CARDIA)    Difficulty of Paying Living Expenses: Not hard at all  Food Insecurity: No Food Insecurity (06/06/2020)   Hunger Vital Sign    Worried About Running Out of Food in the Last Year: Never true     [place] in the Last Year: Never true  Transportation Needs: No Transportation Needs (06/06/2020)   PRAPARE - Hydrologist (Medical): No    Lack of Transportation (Non-Medical): No  Physical Activity: Not on file  Stress: No Stress Concern Present (06/06/2020)   [place]    Feeling of Stress : Only a little  Social Connections: Unknown (06/06/2020)   Social Connection and Isolation Panel [NHANES]    Frequency of Communication with Friends and Family: More than three times a week    Frequency of Social Gatherings with Friends and Family: More than three times a week    Attends Religious Services: Not on Advertising copywriter or Organizations: Not on file    Attends Archivist Meetings: Not on file    Marital Status: Married  Intimate Partner Violence: Not on file    No Known Allergies  No current facility-administered medications for this encounter.   Current Outpatient Medications  Medication Sig Dispense Refill   acetaminophen (TYLENOL) 500 MG tablet Take 1,000 mg by mouth every 6 (six) hours as needed for fever or headache.     ADVAIR HFA 230-21 MCG/ACT inhaler Inhale 2 puffs into the lungs 2 (two) times daily.     apixaban (ELIQUIS) 2.5 MG TABS tablet Take 1 tablet (2.5 mg total) by mouth 2 (two) times daily. 60 tablet 5   Ascorbic Acid (VITAMIN C) 1000 MG tablet Take 1,000 mg by mouth every morning.     b complex vitamins capsule Take 1 capsule by mouth daily. (Patient taking differently: Take 1 capsule by mouth every morning.) 30 capsule 3   Cholecalciferol (VITAMIN D3) 5000 units CAPS Take 5,000 Units by mouth every morning.     clopidogrel (PLAVIX) 75 MG tablet Take 1 tablet (75 mg total) by mouth daily. 30 tablet 11   Cyanocobalamin (VITAMIN B12) 3000 MCG SUBL Place 3,000 mcg under the tongue every morning.     enoxaparin (LOVENOX) 60 MG/0.6ML injection Inject  0.575 mLs (57.5 mg total) into the skin every 12 (twelve) hours for 2 days. 2.3 mL 0   lisinopril-hydrochlorothiazide (ZESTORETIC) 10-12.5 MG tablet Take 1 tablet by mouth daily.     loratadine (CLARITIN) 10 MG tablet Take 10 mg by mouth every morning.     omeprazole (PRILOSEC) 40 MG capsule Take 40 mg by mouth daily as needed (acid reflux).     ondansetron (ZOFRAN) 8 MG tablet Take 1 tablet (8 mg total) by mouth 2 (two) times daily as needed (Nausea or vomiting). 30 tablet 1   REVLIMID 10 MG capsule TAKE 1 CAPSULE BY MOUTH DAILY FOR 21 DAYS, THEN TAKE 7 DAYS OFF. REPEAT EVERY 28 DAYS 21 capsule  0   rosuvastatin (CRESTOR) 10 MG tablet Take 10 mg by mouth daily.     Saccharomyces boulardii 500 MG PACK Take 500 mg by mouth 2 (two) times daily at 8 am and 10 pm. (Patient not taking: Reported on 12/23/2021) 20 each 0    PHYSICAL EXAM Vitals:   12/28/21 1259  BP: 129/65  Pulse: 80  Resp: 16  Temp: 98.3 F (36.8 C)  TempSrc: Oral  SpO2: 99%   Middle-aged woman.  Appears uncomfortable. Regular rate and rhythm Unlabored breathing Left groin with palpable hematoma about access site.  This is about the size of a golf ball.  She is tracking ecchymosis down her left thigh medially.  The right groin has mild ecchymosis without hematoma.  2+ dorsalis pedis pulses bilaterally.  PERTINENT LABORATORY AND RADIOLOGIC DATA  Most recent CBC    Latest Ref Rng & Units 12/28/2021    1:47 PM 12/23/2021    8:51 AM 10/28/2021    9:46 AM  CBC  WBC 4.0 - 10.5 K/uL 5.9  5.4  5.7   Hemoglobin 12.0 - 15.0 g/dL 11.8  13.2  12.9   Hematocrit 36.0 - 46.0 % 34.6  38.8  37.3   Platelets 150 - 400 K/uL 148  145  141      Most recent CMP    Latest Ref Rng & Units 12/28/2021    1:47 PM 12/23/2021    8:51 AM 10/28/2021    9:46 AM  CMP  Glucose 70 - 99 mg/dL 113  114  120   BUN 8 - 23 mg/dL 11  23  16    Creatinine 0.44 - 1.00 mg/dL 1.03  1.03  0.95   Sodium 135 - 145 mmol/L 142  138  139   Potassium 3.5 - 5.1  mmol/L 3.0  3.5  3.6   Chloride 98 - 111 mmol/L 105  106  105   CO2 22 - 32 mmol/L 27  25  27    Calcium 8.9 - 10.3 mg/dL 8.6  8.9  8.7   Total Protein 6.5 - 8.1 g/dL 6.1  6.3  6.2   Total Bilirubin 0.3 - 1.2 mg/dL 0.6  0.9  0.7   Alkaline Phos 38 - 126 U/L 58  74  82   AST 15 - 41 U/L 23  18  15    ALT 0 - 44 U/L 22  16  13      Megan Velasquez. Stanford Breed, MD [facility] Phone Number: [phone] 12/28/2021 3:17 PM  Total time spent on preparing this encounter including chart review, data review, collecting history, examining the patient, coordinating care for this established patient, 40 minutes.  Portions of this report may have been transcribed using voice recognition software.  Every effort has been made to ensure accuracy; however, inadvertent computerized transcription errors may still be present.

## 2021-12-28 NOTE — ED Triage Notes (Addendum)
Patient had surgery on Wednesday by Dr. Unk Lightning w/ vascular where a stents were placed bilaterally after having an abdominal aortagram and now patient is experiencing swelling and pain to the L side groin area. Per patient she has been having fevers at home treating with tylenol last took last night. Patient Aox4. Pt denies chest pain, SHOB.

## 2021-12-28 NOTE — ED Notes (Signed)
Spoke with Dr. Maryan Rued in regards to imaging ordered for patient, at this time will wait for additional imaging when patient gets to an ER treatment room.

## 2021-12-28 NOTE — Discharge Instructions (Addendum)
Take tylenol 1000mg(2 extra strength) four times a day.  ° °Then take the pain medicine if you feel like you need it. Narcotics do not help with the pain, they only make you care about it less.  You can become addicted to this, people may break into your house to steal it.  It will constipate you.  If you drive under the influence of this medicine you can get a DUI.   ° °

## 2021-12-28 NOTE — Progress Notes (Signed)
VASCULAR LAB    Left groin ultrasound has been performed.  See CV proc for preliminary results.  Messaged negative results to Dr. Tyrone Nine via secure chat.  Marquin Patino, RVT 12/28/2021, 4:45 PM

## 2021-12-29 ENCOUNTER — Telehealth: Payer: Self-pay

## 2021-12-29 ENCOUNTER — Encounter: Payer: Self-pay | Admitting: Hematology

## 2021-12-29 ENCOUNTER — Other Ambulatory Visit: Payer: BC Managed Care – PPO

## 2021-12-29 ENCOUNTER — Ambulatory Visit: Payer: BC Managed Care – PPO | Admitting: Hematology

## 2021-12-29 NOTE — Telephone Encounter (Signed)
Pt called requesting a call back.  Reviewed pt's chart, returned call for clarification, two identifiers used. Spoke with pt's husband, [name], with pt on speaker phone. Confirmed knowledge of ED visit and reviewed advice given. Provided additional information regarding activity level. Pt will be called to schedule post-op f/u. Confirmed understanding.

## 2021-12-30 LAB — MULTIPLE MYELOMA PANEL, SERUM
Albumin SerPl Elph-Mcnc: 3.7 g/dL (ref 2.9–4.4)
Albumin/Glob SerPl: 1.7 (ref 0.7–1.7)
Alpha 1: 0.2 g/dL (ref 0.0–0.4)
Alpha2 Glob SerPl Elph-Mcnc: 0.8 g/dL (ref 0.4–1.0)
B-Globulin SerPl Elph-Mcnc: 0.7 g/dL (ref 0.7–1.3)
Gamma Glob SerPl Elph-Mcnc: 0.6 g/dL (ref 0.4–1.8)
Globulin, Total: 2.3 g/dL (ref 2.2–3.9)
IgA: 96 mg/dL (ref 87–352)
IgG (Immunoglobin G), Serum: 623 mg/dL (ref 586–1602)
IgM (Immunoglobulin M), Srm: 25 mg/dL — ABNORMAL LOW (ref 26–217)
Total Protein ELP: 6 g/dL (ref 6.0–8.5)

## 2021-12-31 ENCOUNTER — Other Ambulatory Visit: Payer: Self-pay

## 2021-12-31 DIAGNOSIS — C9000 Multiple myeloma not having achieved remission: Secondary | ICD-10-CM

## 2021-12-31 MED ORDER — REVLIMID 10 MG PO CAPS: ORAL_CAPSULE | ORAL | 0 refills | Status: DC

## 2022-01-16 ENCOUNTER — Other Ambulatory Visit: Payer: Self-pay | Admitting: *Deleted

## 2022-01-16 DIAGNOSIS — I739 Peripheral vascular disease, unspecified: Secondary | ICD-10-CM

## 2022-01-16 DIAGNOSIS — M79605 Pain in left leg: Secondary | ICD-10-CM

## 2022-01-23 ENCOUNTER — Ambulatory Visit (HOSPITAL_COMMUNITY)
Admission: RE | Admit: 2022-01-23 | Discharge: 2022-01-23 | Disposition: A | Discharge status: Home / Self Care | Payer: BC Managed Care – PPO | Source: Ambulatory Visit | Attending: Vascular Surgery | Admitting: Vascular Surgery

## 2022-01-23 ENCOUNTER — Other Ambulatory Visit: Payer: Self-pay | Admitting: Hematology

## 2022-01-23 ENCOUNTER — Ambulatory Visit (INDEPENDENT_AMBULATORY_CARE_PROVIDER_SITE_OTHER)
Admission: RE | Admit: 2022-01-23 | Discharge: 2022-01-23 | Disposition: A | Discharge status: Home / Self Care | Payer: BC Managed Care – PPO | Source: Ambulatory Visit | Attending: Vascular Surgery | Admitting: Vascular Surgery

## 2022-01-23 DIAGNOSIS — M79604 Pain in right leg: Secondary | ICD-10-CM

## 2022-01-23 DIAGNOSIS — I739 Peripheral vascular disease, unspecified: Secondary | ICD-10-CM

## 2022-01-23 DIAGNOSIS — M79605 Pain in left leg: Secondary | ICD-10-CM | POA: Insufficient documentation

## 2022-01-23 DIAGNOSIS — C9000 Multiple myeloma not having achieved remission: Secondary | ICD-10-CM

## 2022-01-27 ENCOUNTER — Other Ambulatory Visit: Payer: Self-pay

## 2022-01-27 ENCOUNTER — Encounter: Payer: Self-pay | Admitting: Hematology

## 2022-01-28 NOTE — Progress Notes (Signed)
[facility] [place] HISTORY AND PHYSICAL   History of Present Illness:  Patient is a 61 y.o. year old female who presents for evaluation of PAD.  She has known peripheral arterial disease and recent decrease in right-sided ABIs with discoloration of the toes. 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  Complicating her presentation is her history of multiple myeloma, treated with Revlimid-both of which lead to hypercoagulable state.  She has managed with Eliquis 2.5 twice daily to combat this.  CT scan was reviewed demonstrating 2 areas of concern, the terminal aorta and common iliac arteries, as well as the right popliteal artery.She has known peripheral arterial disease and recent decrease in right-sided ABIs with discoloration of the toes.  S/P angiogram with right common iliac stent placement by DR. Robins on 12/24/21.    She was last seen in the ED by DR. Hawken with a complaint of left groin swelling with possible hematoma.  Duplex showed no evidence of pseudoaneurysm.    She is here today for baseline f/u studies after her angiogram and intervention.  She states she is doing well and the ecchymosis with left groin edema has subsided.  She denies pain, non healing wounds and claudication symptoms.    Post angio  Findings:  Aortogram: Bilateral renal arteries patent, distal aorta without flow-limiting stenosis.  Greater than 90% critical right-sided common iliac artery stenosis.  Left iliac system widely patent.  Bilateral hypogastric arteries patent.   On the right: No flow-limiting stenosis appreciated in the common femoral artery, profunda, superficial femoral artery, popliteal artery.  Two-vessel runoff to the foot.  Anterior tibial, peroneal arteries with a large collateral to the posterior tibial artery.  The posterior tibial artery above the ankle is not visualized.  This could be a variant anatomy.   On the left: No flow-limiting  stenosis appreciated in the common femoral artery, profunda, superficial femoral artery, popliteal artery.  Three-vessel runoff to the foot.  Past Medical History:  Diagnosis Date   Cancer ([place])    GERD (gastroesophageal reflux disease)    History of kidney stones    HTN (hypertension)    Multiple myeloma ([place])    Renal disorder     Past Surgical History:  Procedure Laterality Date   ABDOMINAL AORTOGRAM W/LOWER EXTREMITY N/A 12/24/2021   Procedure: ABDOMINAL AORTOGRAM W/LOWER EXTREMITY;  Surgeon: Broadus John, MD;  Location: [facility];  Service: Cardiovascular;  Laterality: N/A;   CHOLECYSTECTOMY     COLONOSCOPY  01/2015   Dr. Britta Mccreedy: Mild diverticulosis, sessile polyp ranging 3 to 5 mm removed from the proximal transverse colon, semi-pedunculated polyp 5 to 9 mm in size removed from the sigmoid colon.  Sigmoid colon polyp was serrated adenoma, transverse colon polyp was adenomatous.  Patient was told to have another colonoscopy in 3 years.   COLONOSCOPY WITH PROPOFOL N/A 10/11/2017   Procedure: COLONOSCOPY WITH PROPOFOL;  Surgeon: Daneil Dolin, MD;  [facility];  Service: Endoscopy;  Laterality: N/A;  1:15pm   IR IMAGING GUIDED PORT INSERTION  06/05/2020   PERIPHERAL VASCULAR INTERVENTION  12/24/2021   Procedure: PERIPHERAL VASCULAR INTERVENTION;  Surgeon: Broadus John, MD;  Location: [facility];  Service: Cardiovascular;;   POLYPECTOMY  10/11/2017   Procedure: POLYPECTOMY;  Surgeon: Daneil Dolin, MD;  [facility];  Service: Endoscopy;;  descending colon polyps cs times 2    ROS:   General:  No weight  loss, Fever, chills  HEENT: No recent headaches, no nasal bleeding, no visual changes, no sore throat  Neurologic: No dizziness, blackouts, seizures. No recent symptoms of stroke or mini- stroke. No recent episodes of slurred speech, or temporary blindness.  Cardiac: No recent episodes of chest pain/pressure, no shortness of breath at  rest.  No shortness of breath with exertion.  Denies history of atrial fibrillation or irregular heartbeat  Vascular: No history of rest pain in feet.  No history of claudication.  No history of non-healing ulcer, No history of DVT   Pulmonary: No home oxygen, no productive cough, no hemoptysis,  No asthma or wheezing  Musculoskeletal:  _0  Arthritis, _1  Low back pain,  _2  Joint pain  Hematologic:No history of hypercoagulable state.  No history of easy bleeding.  No history of anemia  Gastrointestinal: No hematochezia or melena,  No gastroesophageal reflux, no trouble swallowing  Urinary: _3  chronic Kidney disease, _4  on HD - _5  MWF or _6  TTHS, _7  Burning with urination, _8  Frequent urination, _9  Difficulty urinating;   Skin: No rashes  Psychological: No history of anxiety,  No history of depression  Social History Social History   Tobacco Use   Smoking status: Former    Packs/day: 1.00    Years: 28.00    Total pack years: 28.00    Types: Cigarettes    Quit date: 10/08/2013    Years since quitting: 8.3   Smokeless tobacco: Never  Vaping Use   Vaping Use: Never used  Substance Use Topics   Alcohol use: Not Currently   Drug use: Never    Family History Family History  Problem Relation Age of Onset   Heart disease Mother    Diabetes Mother    Lung cancer Father    COPD Father    Colon cancer Neg Hx     Allergies  No Known Allergies   Current Outpatient Medications  Medication Sig Dispense Refill   acetaminophen (TYLENOL) 500 MG tablet Take 1,000 mg by mouth every 6 (six) hours as needed for fever or headache.     ADVAIR HFA 230-21 MCG/ACT inhaler Inhale 2 puffs into the lungs 2 (two) times daily.     apixaban (ELIQUIS) 2.5 MG TABS tablet Take 1 tablet (2.5 mg total) by mouth 2 (two) times daily. 60 tablet 5   Ascorbic Acid (VITAMIN C) 1000 MG tablet Take 1,000 mg by mouth every morning.     b complex vitamins capsule Take 1 capsule by mouth daily.  (Patient taking differently: Take 1 capsule by mouth every morning.) 30 capsule 3   Cholecalciferol (VITAMIN D3) 5000 units CAPS Take 5,000 Units by mouth every morning.     clopidogrel (PLAVIX) 75 MG tablet Take 1 tablet (75 mg total) by mouth daily. 30 tablet 11   Cyanocobalamin (VITAMIN B12) 3000 MCG SUBL Place 3,000 mcg under the tongue every morning.     lisinopril-hydrochlorothiazide (ZESTORETIC) 10-12.5 MG tablet Take 1 tablet by mouth daily.     loratadine (CLARITIN) 10 MG tablet Take 10 mg by mouth every morning.     omeprazole (PRILOSEC) 40 MG capsule Take 40 mg by mouth daily as needed (acid reflux).     ondansetron (ZOFRAN) 8 MG tablet Take 1 tablet (8 mg total) by mouth 2 (two) times daily as needed (Nausea or vomiting). 30 tablet 1   REVLIMID 10 MG capsule TAKE 1 CAPSULE BY MOUTH DAILY FOR 21 DAYS, THEN  TAKE 7 DAYS OFF. REPEAT EVERY 28 DAYS 21 capsule 0   rosuvastatin (CRESTOR) 10 MG tablet Take 10 mg by mouth daily.     Saccharomyces boulardii 500 MG PACK Take 500 mg by mouth 2 (two) times daily at 8 am and 10 pm. 20 each 0   No current facility-administered medications for this visit.    Physical Examination  Vitals:   01/30/22 1056  BP: 123/74  Pulse: 81  Resp: 20  Temp: 97.6 F (36.4 C)  TempSrc: Temporal  SpO2: 97%  Weight: 129 lb 3.2 oz (58.6 kg)  Height: _0  (1.651 m)    Body mass index is 21.5 kg/m.  General:  Alert and oriented, no acute distress HEENT: Normal Neck: No bruit or JVD Pulmonary: Clear to auscultation bilaterally Cardiac: Regular Rate and Rhythm without murmur Abdomen: Soft, non-tender, non-distended, no mass, no scars Skin: No rash Extremity Pulses:  Palpable DP B LE, groins are soft without edema or erythema.  No residual ecchymosis Musculoskeletal: No deformity or edema  Neurologic: Upper and lower extremity motor 5/5 and symmetric  DATA:       Right Stent(s):  +---------------+--------+--------+---------+--------+  CIA             PSV cm/sStenosisWaveform Comments  +---------------+--------+--------+---------+--------+  Proximal Stent 155             triphasic          +---------------+--------+--------+---------+--------+  Mid Stent      162             triphasic          +---------------+--------+--------+---------+--------+  Distal Stent   186             triphasic          +---------------+--------+--------+---------+--------+  Distal to Stent137             biphasic           +---------------+--------+--------+---------+--------+         Summary:  Stenosis: +------------------+-----------+  Location          Stent        +------------------+-----------+  Right Common Iliacno stenosis  +------------------+-----------+     LOWER EXTREMITY DOPPLER STUDY  Patient Name:  Megan Velasquez  Date of Exam:   01/23/2022 Medical Rec #: [id number]     Accession #:    [id number] Date of Birth: 1960/07/01     Patient Gender: F Patient Age:   68 years Exam Location:  [facility] Procedure:      VAS [place] ABI WITH/WO TBI Referring Phys:   --------------------------------------------------------------------------- -----   Indications: Peripheral artery disease.    Vascular Interventions: 12/24/2021                         Pre-operative Diagnosis: Right lower extremity                         atheroembolic event                         Post-operative diagnosis: Same                         Surgeon: Broadus John, MD                         Procedure Performed:  1. Bilateral common femoral artery access using an                         ultrasound-guided micropuncture needle                         2. Aortogram                         3. Bilateral lower extremity angiogram                         4. Right common iliac artery 8 x 27m Gore VBX stent                         5. Right-sided groin closure_Pro-glide                          6. Left-sided groin closure_Mynx                         7. Moderate sedation time 50 minutes                         8. Contrast volume 165 mL.  Performing Technologist: ERonal FearRVS, RCS    Examination Guidelines: A complete evaluation includes at minimum, Doppler waveform signals and systolic blood pressure reading at the level of bilateral brachial, anterior tibial, and posterior tibial arteries, when vessel segments are accessible. Bilateral testing is considered an integral part of a complete examination. Photoelectric Plethysmograph (PPG) waveforms and toe systolic pressure readings are included as required and additional duplex testing as needed. Limited examinations for reoccurring indications may be performed as noted.    ABI Findings: +---------+------------------+-----+---------+--------+ Right    Rt Pressure (mmHg)IndexWaveform Comment  +---------+------------------+-----+---------+--------+ Brachial 133                                      +---------+------------------+-----+---------+--------+ PTA      154               1.16 triphasic         +---------+------------------+-----+---------+--------+ DP       141               1.06 triphasic         +---------+------------------+-----+---------+--------+ Great Toe122               0.92                   +---------+------------------+-----+---------+--------+   +-------+-----------+-----------+------------+------------+  ABI/TBIToday's ABIToday's TBIPrevious ABIPrevious TBI  +-------+-----------+-----------+------------+------------+  Right 1.16       0.92       0.52        absent        +-------+-----------+-----------+------------+------------+  Left  0.99       0.80       1.03        0.75          +-------+-----------+-----------+------------+------------+   ASSESSMENT/PLAN:  S/P  left common iliac artery stenting with Dr. RUnk Lightningon [id number]/0960for  atheroembolic disease.  Post op left groin edema with ecchymosis.  This has resolved.     History of peripheral  arterial disease and recent decrease in right-sided ABIs with discoloration of the toes. 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  Complicating her presentation is her history of multiple myeloma, treated with Revlimid-both of which lead to hypercoagulable state.  She has managed with Eliquis 2.5 twice daily to combat this.    She has palpable DP pulse B LE, resolved post op left groin edema and ecchymosis.  Her stent is patent on duplex and her left LE ABI has improved with triphasic wave forms and index of 0.99.  She will continue with a daily Statin, Plaivix  and plans on starting a walking program.  F/U in 9 months for stent duplex and repeat ABI's.    If she develops symptoms of ischemia she will call our office.  Roxy Horseman PA-C [facility] [place] Office: [phone]   MD in clinic [place]

## 2022-01-29 DIAGNOSIS — Z8579 Personal history of other malignant neoplasms of lymphoid, hematopoietic and related tissues: Secondary | ICD-10-CM | POA: Insufficient documentation

## 2022-01-30 ENCOUNTER — Ambulatory Visit (INDEPENDENT_AMBULATORY_CARE_PROVIDER_SITE_OTHER): Payer: BC Managed Care – PPO | Admitting: Physician Assistant

## 2022-01-30 VITALS — BP 123/74 | HR 81 | Temp 97.6°F | Resp 20 | Ht 65.0 in | Wt 129.2 lb

## 2022-01-30 DIAGNOSIS — I739 Peripheral vascular disease, unspecified: Secondary | ICD-10-CM

## 2022-02-04 ENCOUNTER — Other Ambulatory Visit: Payer: Self-pay

## 2022-02-04 DIAGNOSIS — I739 Peripheral vascular disease, unspecified: Secondary | ICD-10-CM

## 2022-02-19 ENCOUNTER — Other Ambulatory Visit: Payer: Self-pay | Admitting: Hematology

## 2022-02-19 DIAGNOSIS — C9000 Multiple myeloma not having achieved remission: Secondary | ICD-10-CM

## 2022-02-23 ENCOUNTER — Other Ambulatory Visit: Payer: Self-pay

## 2022-02-23 ENCOUNTER — Encounter: Payer: Self-pay | Admitting: Hematology

## 2022-02-24 ENCOUNTER — Inpatient Hospital Stay: Payer: BC Managed Care – PPO

## 2022-02-24 ENCOUNTER — Inpatient Hospital Stay: Payer: BC Managed Care – PPO | Attending: Hematology | Admitting: Hematology

## 2022-02-24 VITALS — BP 131/69 | HR 76 | Temp 98.1°F | Resp 15 | Wt 129.3 lb

## 2022-02-24 DIAGNOSIS — C9000 Multiple myeloma not having achieved remission: Secondary | ICD-10-CM | POA: Insufficient documentation

## 2022-02-24 DIAGNOSIS — C9001 Multiple myeloma in remission: Secondary | ICD-10-CM | POA: Diagnosis not present

## 2022-02-24 DIAGNOSIS — Z95828 Presence of other vascular implants and grafts: Secondary | ICD-10-CM

## 2022-02-24 LAB — CMP (CANCER CENTER ONLY)
ALT: 16 U/L (ref 0–44)
AST: 21 U/L (ref 15–41)
Albumin: 4.2 g/dL (ref 3.5–5.0)
Alkaline Phosphatase: 66 U/L (ref 38–126)
Anion gap: 6 (ref 5–15)
BUN: 18 mg/dL (ref 8–23)
CO2: 27 mmol/L (ref 22–32)
Calcium: 8.9 mg/dL (ref 8.9–10.3)
Chloride: 105 mmol/L (ref 98–111)
Creatinine: 0.91 mg/dL (ref 0.44–1.00)
GFR, Estimated: >60 mL/min (ref >60)
Glucose, Bld: 113 mg/dL — ABNORMAL HIGH (ref 70–99)
Potassium: 3.8 mmol/L (ref 3.5–5.1)
Sodium: 138 mmol/L (ref 135–145)
Total Bilirubin: 0.7 mg/dL (ref 0.3–1.2)
Total Protein: 6.3 g/dL — ABNORMAL LOW (ref 6.5–8.1)

## 2022-02-24 LAB — CBC WITH DIFFERENTIAL (CANCER CENTER ONLY)
Abs Immature Granulocytes: 0 10*3/uL (ref 0.00–0.07)
Basophils Absolute: 0 10*3/uL (ref 0.0–0.1)
Basophils Relative: 1 %
Eosinophils Absolute: 0.3 10*3/uL (ref 0.0–0.5)
Eosinophils Relative: 10 %
HCT: 37.3 % (ref 36.0–46.0)
Hemoglobin: 12.8 g/dL (ref 12.0–15.0)
Immature Granulocytes: 0 %
Lymphocytes Relative: 31 %
Lymphs Abs: 1 10*3/uL (ref 0.7–4.0)
MCH: 31.4 pg (ref 26.0–34.0)
MCHC: 34.3 g/dL (ref 30.0–36.0)
MCV: 91.4 fL (ref 80.0–100.0)
Monocytes Absolute: 0.4 10*3/uL (ref 0.1–1.0)
Monocytes Relative: 11 %
Neutro Abs: 1.5 10*3/uL — ABNORMAL LOW (ref 1.7–7.7)
Neutrophils Relative %: 47 %
Platelet Count: 146 10*3/uL — ABNORMAL LOW (ref 150–400)
RBC: 4.08 MIL/uL (ref 3.87–5.11)
RDW: 13.6 % (ref 11.5–15.5)
WBC Count: 3.3 10*3/uL — ABNORMAL LOW (ref 4.0–10.5)
nRBC: 0 % (ref 0.0–0.2)

## 2022-02-24 MED ORDER — SODIUM CHLORIDE 0.9% FLUSH
10.0000 mL | Freq: Once | INTRAVENOUS | Status: AC
  Administered 2022-02-24: 10 mL

## 2022-02-24 MED ORDER — HEPARIN SOD (PORK) LOCK FLUSH 100 UNIT/ML IV SOLN
500.0000 [IU] | Freq: Once | INTRAVENOUS | Status: AC
  Administered 2022-02-24: 500 [IU]

## 2022-02-24 NOTE — Progress Notes (Signed)
HEMATOLOGY/ONCOLOGY CLINIC NOTE  Date of Service: 02/24/2022  Patient Care Team: [name], MD as PCP - General (Family Medicine) [name], [name], MD as PCP - Cardiology (Cardiology) [name], FNP as PCP - Family Medicine (Family Medicine) [name] [name], MD as Consulting Physician (Gastroenterology)  CHIEF COMPLAINTS/PURPOSE OF CONSULTATION:  Continued follow-up for evaluation and management of multiple myeloma  HISTORY OF PRESENTING ILLNESS:  Please see previous note for details on initial presentation  INTERVAL HISTORY Megan Velasquez is a 61 y.o. female is here for continued evaluation and management of her multiple myeloma.  Patient was last seen by me on 12/23/2021 and was doing well overall, but complained of mild right hip pain. She was also tolerating Revlimid without any toxicities.   Patient is here with her husband during today's visit and reports she has been doing well since our last visit. She complains of bilateral leg pain which started around 2-3 days ago. She has been doing more physical exercise than before which is the cause of bilateral leg pain.   Patient had some questions regarding her previous lab results, which were answered.   She is regularly taking Vitamin-D supplement. She is regularly taking Revlimid and notes no toxicities.  Patient's husband reports she has had strep 3-4 times this year. She has had strep twice in the past 2 months. She was prescribed antibiotics for strep each time.   She denies fever, chills, back pain, abdominal pain, abnormal bowl moments, and leg swelling.  She has not received the influenza vaccine, COVID-19 Booster, Shingles vaccine, and the RSV vaccine. She is up to date with Pneumonia vaccine.    MEDICAL HISTORY:  Past Medical History:  Diagnosis Date   Cancer ([place])    GERD (gastroesophageal reflux disease)    History of kidney stones    HTN (hypertension)    Multiple myeloma ([place])    Renal  disorder     SURGICAL HISTORY: Past Surgical History:  Procedure Laterality Date   ABDOMINAL AORTOGRAM W/LOWER EXTREMITY N/A 12/24/2021   Procedure: ABDOMINAL AORTOGRAM W/LOWER EXTREMITY;  Surgeon: Broadus John, MD;  Location: [facility];  Service: Cardiovascular;  Laterality: N/A;   CHOLECYSTECTOMY     COLONOSCOPY  01/2015   Dr. Britta Mccreedy: Mild diverticulosis, sessile polyp ranging 3 to 5 mm removed from the proximal transverse colon, semi-pedunculated polyp 5 to 9 mm in size removed from the sigmoid colon.  Sigmoid colon polyp was serrated adenoma, transverse colon polyp was adenomatous.  Patient was told to have another colonoscopy in 3 years.   COLONOSCOPY WITH PROPOFOL N/A 10/11/2017   Procedure: COLONOSCOPY WITH PROPOFOL;  Surgeon: Daneil Dolin, MD;  [facility];  Service: Endoscopy;  Laterality: N/A;  1:15pm   IR IMAGING GUIDED PORT INSERTION  06/05/2020   PERIPHERAL VASCULAR INTERVENTION  12/24/2021   Procedure: PERIPHERAL VASCULAR INTERVENTION;  Surgeon: Broadus John, MD;  Location: [facility];  Service: Cardiovascular;;   POLYPECTOMY  10/11/2017   Procedure: POLYPECTOMY;  Surgeon: Daneil Dolin, MD;  [facility];  Service: Endoscopy;;  descending colon polyps cs times 2    SOCIAL HISTORY: Social History   Socioeconomic History   Marital status: Married    Spouse name: Not on file   Number of children: Not on file   Years of education: Not on file   Highest education level: Not on file  Occupational History   Not on file  Tobacco Use  Smoking status: Former    Packs/day: 1.00    Years: 28.00    Total pack years: 28.00    Types: Cigarettes    Quit date: 10/08/2013    Years since quitting: 8.3   Smokeless tobacco: Never  Vaping Use   Vaping Use: Never used  Substance and Sexual Activity   Alcohol use: Not Currently   Drug use: Never   Sexual activity: Yes    Birth control/protection: Post-menopausal  Other Topics Concern    Not on file  Social History Narrative   Not on file   Social Determinants of Health   Financial Resource Strain: Low Risk  (06/06/2020)   Overall Financial Resource Strain (CARDIA)    Difficulty of Paying Living Expenses: Not hard at all  Food Insecurity: No Food Insecurity (06/06/2020)   Hunger Vital Sign    Worried About Running Out of Food in the Last Year: Never true    [place] in the Last Year: Never true  Transportation Needs: No Transportation Needs (06/06/2020)   PRAPARE - Hydrologist (Medical): No    Lack of Transportation (Non-Medical): No  Physical Activity: Not on file  Stress: No Stress Concern Present (06/06/2020)   [place]    Feeling of Stress : Only a little  Social Connections: Unknown (06/06/2020)   Social Connection and Isolation Panel [NHANES]    Frequency of Communication with Friends and Family: More than three times a week    Frequency of Social Gatherings with Friends and Family: More than three times a week    Attends Religious Services: Not on Advertising copywriter or Organizations: Not on file    Attends Archivist Meetings: Not on file    Marital Status: Married  Human resources officer Violence: Not on file    FAMILY HISTORY: Family History  Problem Relation Age of Onset   Heart disease Mother    Diabetes Mother    Lung cancer Father    COPD Father    Colon cancer Neg Hx     ALLERGIES:  has No Known Allergies.  MEDICATIONS:  Current Outpatient Medications  Medication Sig Dispense Refill   acetaminophen (TYLENOL) 500 MG tablet Take 1,000 mg by mouth every 6 (six) hours as needed for fever or headache.     ADVAIR HFA 230-21 MCG/ACT inhaler Inhale 2 puffs into the lungs 2 (two) times daily.     apixaban (ELIQUIS) 2.5 MG TABS tablet Take 1 tablet (2.5 mg total) by mouth 2 (two) times daily. 60 tablet 5   Ascorbic Acid (VITAMIN C)  1000 MG tablet Take 1,000 mg by mouth every morning.     b complex vitamins capsule Take 1 capsule by mouth daily. (Patient taking differently: Take 1 capsule by mouth every morning.) 30 capsule 3   Cholecalciferol (VITAMIN D3) 5000 units CAPS Take 5,000 Units by mouth every morning.     clopidogrel (PLAVIX) 75 MG tablet Take 1 tablet (75 mg total) by mouth daily. 30 tablet 11   Cyanocobalamin (VITAMIN B12) 3000 MCG SUBL Place 3,000 mcg under the tongue every morning.     lisinopril-hydrochlorothiazide (ZESTORETIC) 10-12.5 MG tablet Take 1 tablet by mouth daily.     loratadine (CLARITIN) 10 MG tablet Take 10 mg by mouth every morning.     omeprazole (PRILOSEC) 40 MG capsule Take 40 mg by mouth daily as needed (acid reflux).  ondansetron (ZOFRAN) 8 MG tablet Take 1 tablet (8 mg total) by mouth 2 (two) times daily as needed (Nausea or vomiting). 30 tablet 1   REVLIMID 10 MG capsule TAKE 1 CAPSULE BY MOUTH DAILY FOR 21 DAYS, THEN TAKE 7 DAYS OFF . REPEAT EVERY 28 DAYS 21 capsule 0   rosuvastatin (CRESTOR) 10 MG tablet Take 10 mg by mouth daily.     Saccharomyces boulardii 500 MG PACK Take 500 mg by mouth 2 (two) times daily at 8 am and 10 pm. 20 each 0   No current facility-administered medications for this visit.    REVIEW OF SYSTEMS:   10 Point review of Systems was done is negative except as noted above.  PHYSICAL EXAMINATION:  ECOG PERFORMANCE STATUS: 1 - Symptomatic but completely ambulatory .BP 131/69 (BP Location: Left Arm, Patient Position: Sitting)   Pulse 76   Temp 98.1 F (36.7 C) (Temporal)   Resp 15   Wt 129 lb 5.1 oz (58.7 kg)   SpO2 99%   BMI 21.52 kg/m  NAD GENERAL:alert, in no acute distress and comfortable SKIN: no acute rashes, no significant lesions EYES: conjunctiva are pink and non-injected, sclera anicteric NECK: supple, no JVD LYMPH:  no palpable lymphadenopathy in the cervical, axillary or inguinal regions LUNGS: clear to auscultation b/l with normal  respiratory effort HEART: regular rate & rhythm ABDOMEN:  normoactive bowel sounds , non tender, not distended. Extremity: no pedal edema PSYCH: alert & oriented x 3 with fluent speech NEURO: no focal motor/sensory deficits  LABORATORY DATA:  I have reviewed the data as listed  .    Latest Ref Rng & Units 02/24/2022    9:33 AM 12/28/2021    1:47 PM 12/23/2021    8:51 AM  CBC  WBC 4.0 - 10.5 K/uL 3.3  5.9  5.4   Hemoglobin 12.0 - 15.0 g/dL 12.8  11.8  13.2   Hematocrit 36.0 - 46.0 % 37.3  34.6  38.8   Platelets 150 - 400 K/uL 146  148  145     .    Latest Ref Rng & Units 02/24/2022    9:33 AM 12/28/2021    1:47 PM 12/23/2021    8:51 AM  CMP  Glucose 70 - 99 mg/dL 113  113  114   BUN 8 - 23 mg/dL _0 Creatinine 0.44 - 1.00 mg/dL 0.91  1.03  1.03   Sodium 135 - 145 mmol/L 138  142  138   Potassium 3.5 - 5.1 mmol/L 3.8  3.0  3.5   Chloride 98 - 111 mmol/L 105  105  106   CO2 22 - 32 mmol/L _1 Calcium 8.9 - 10.3 mg/dL 8.9  8.6  8.9   Total Protein 6.5 - 8.1 g/dL 6.3  6.1  6.3   Total Bilirubin 0.3 - 1.2 mg/dL 0.7  0.6  0.9   Alkaline Phos 38 - 126 U/L 66  58  74   AST 15 - 41 U/L _2 ALT 0 - 44 U/L _3 Most recent lab results (03/15/2020) of CBC is as follows: all values are WNL except for RBC at 3.66, Hgb at 9.7, HCT at 31.8, MCHC at 30.5, RDW Standard Deviation 58.7, Red Cell Distribution Width At 18.6,  BUN at 19, Creatinine at 1.06, Total Protein at 10.2, Albumin at 2.7, AST at  14,. 03/13/2020 UPEP shows no M-Spike, all values are WNL 03/06/2020 SPEP shows Total Protein at 10.1, Beta Globulin at 5.2, M-Spike at 4.3, Total Globulin at 6.4, A/G Ratio at 0.6 03/06/2020 Free Kappa Light Chains at 181.1, Free Lambda Light Chains Serum at 3.6, K/L ratio at 50.31 03/06/2020 Beta-2 Microglobulin at 2.5           RADIOGRAPHIC STUDIES: I have personally reviewed the radiological images as listed and agreed with the findings in the  report. No results found.  ASSESSMENT & PLAN:   61 year old female here for continued follow-up, evaluation and management of multiple myeloma  #1  IgA kappa multiple myeloma status post induction treatment with carfilzomib Revlimid dexamethasone  presenting with anemia hemoglobin 9.4. Creatinine 1.28 No hypercalcemia Bone survey with no obvious skeletal lesions. Baseline M spike of 4.3 g/dL. Beta-2 Microglobulin at 2.5 Mol Cy Monosomy 13 and dup 1q  2) status post stem cell collection assisted with G-CSF and Plerixafor. Currently on maintenance Revlimid. Currently had declined proceeding with consolidative autologous HSCT  3) Diarrhea - ? Related to Revlimid.  Mild and nearly resolved GI panel and c diff neg30  PLAN: -Patient's labs from today were discussed in detail with the patient. Labs shows WBC counts of 3.3 K, hemoglobin of 12.8 K, and platelets of 146 K. CMP stable. -Myeloma lab showed she is remission. No M spike and IFE neg. -Patient reports no significant toxicities from her current dose of Revlimid at this time. -Patient has no symptom suggestive of disease progression  FOLLOW UP: RTC with Dr Irene Limbo with labs in 2 months  The total time spent in the appointment was 20 minutes* .  All of the patient's questions were answered with apparent satisfaction. The patient knows to call the clinic with any problems, questions or concerns.   Sullivan Lone MD MS AAHIVMS [facility] Hematology/Oncology Physician [facility]  .*Total Encounter Time as defined by the Centers for Medicare and Medicaid Services includes, in addition to the face-to-face time of a patient visit (documented in the note above) non-face-to-face time: obtaining and reviewing outside history, ordering and reviewing medications, tests or procedures, care coordination (communications with other health care professionals or caregivers) and documentation in the medical record.   I, Cleda Mccreedy, am  acting as a Education administrator for Sullivan Lone, MD. .I have reviewed the above documentation for accuracy and completeness, and I agree with the above. Brunetta Genera MD

## 2022-03-02 LAB — MULTIPLE MYELOMA PANEL, SERUM
Albumin SerPl Elph-Mcnc: 3.8 g/dL (ref 2.9–4.4)
Albumin/Glob SerPl: 1.8 — ABNORMAL HIGH (ref 0.7–1.7)
Alpha 1: 0.2 g/dL (ref 0.0–0.4)
Alpha2 Glob SerPl Elph-Mcnc: 0.7 g/dL (ref 0.4–1.0)
B-Globulin SerPl Elph-Mcnc: 0.8 g/dL (ref 0.7–1.3)
Gamma Glob SerPl Elph-Mcnc: 0.6 g/dL (ref 0.4–1.8)
Globulin, Total: 2.2 g/dL (ref 2.2–3.9)
IgA: 105 mg/dL (ref 87–352)
IgG (Immunoglobin G), Serum: 615 mg/dL (ref 586–1602)
IgM (Immunoglobulin M), Srm: 26 mg/dL (ref 26–217)
Total Protein ELP: 6 g/dL (ref 6.0–8.5)

## 2022-03-03 ENCOUNTER — Encounter: Payer: Self-pay | Admitting: Hematology

## 2022-03-04 IMAGING — CT CT BIOPSY AND ASPIRATION BONE MARROW
1 of 2 series · 15 of 28 positions shown, 19 images · non-contrast
Comparison: none

INDICATION: 59-year-old female with new diagnosis of multiple myeloma. Evaluate
marrow involvement for staging.

[Series 2: i-spiral 5.0 br40 · axial · 0.98mm/px · z∈[+1060,+1137]mm · 15 of 26 slices shown, 19 images]
[im 2/26  mediastinal]
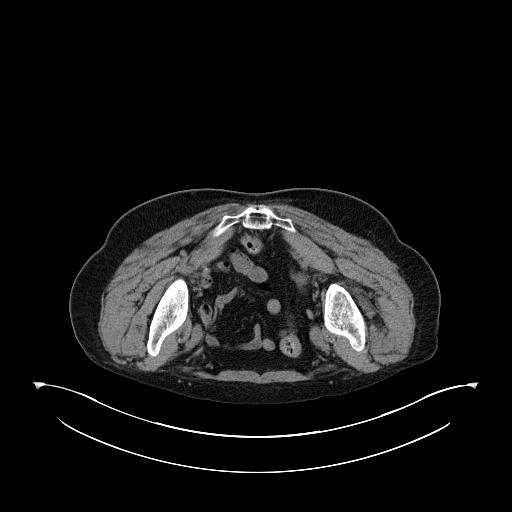
[im 2/26  lung]
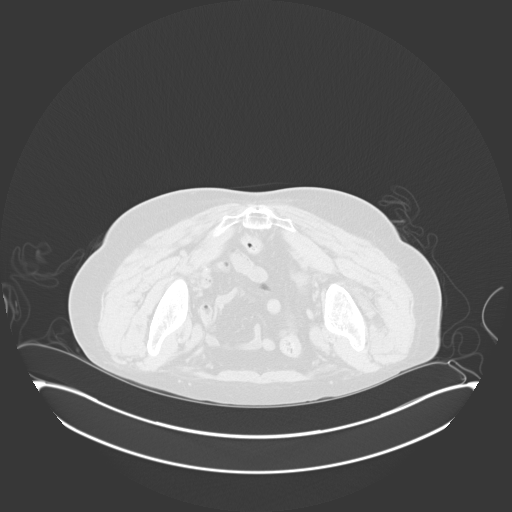
[im 4/26  lung]
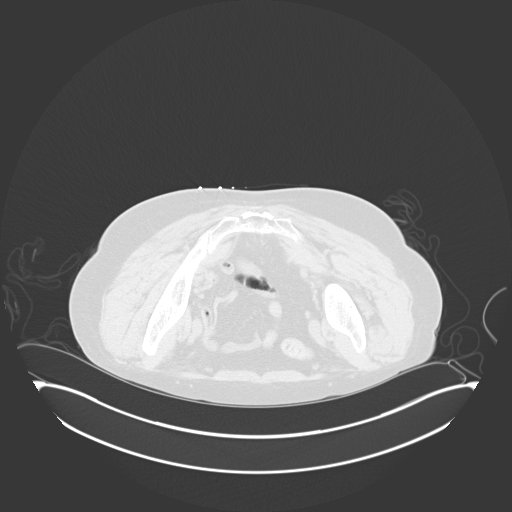
[im 5/26  lung]
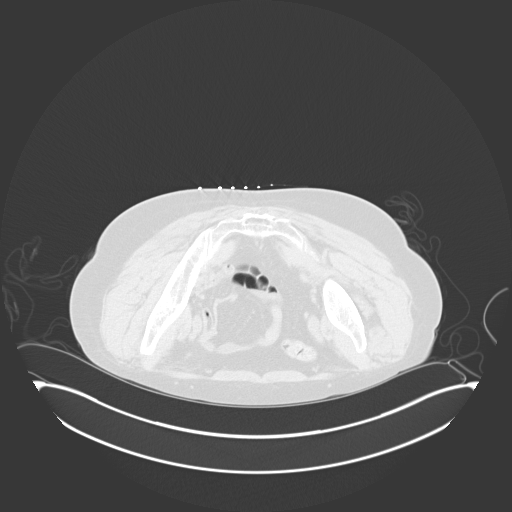
[im 7/26  lung]
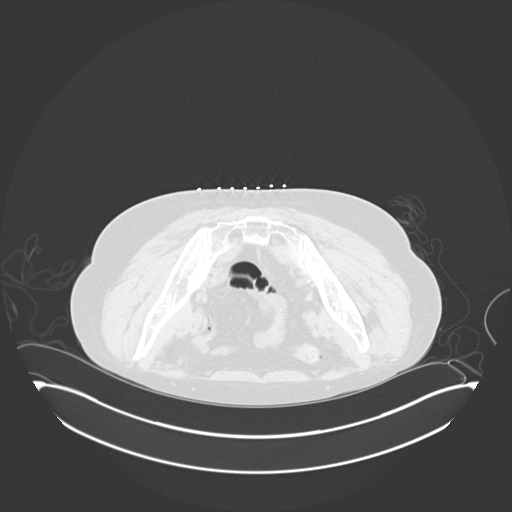
[im 8/26  mediastinal]
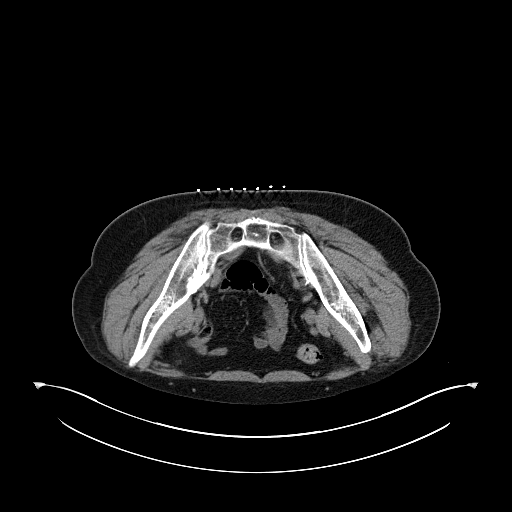
[im 8/26  lung]
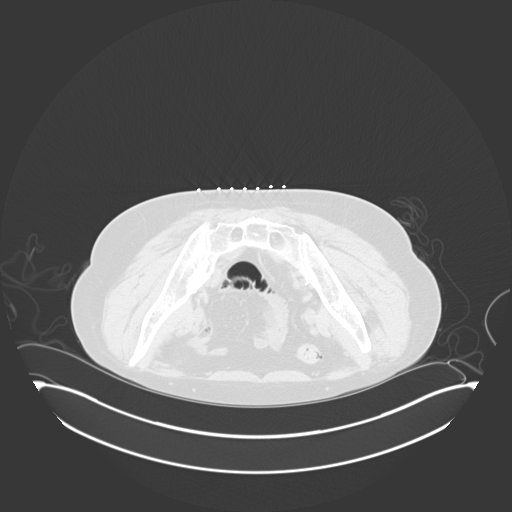
[im 10/26  lung]
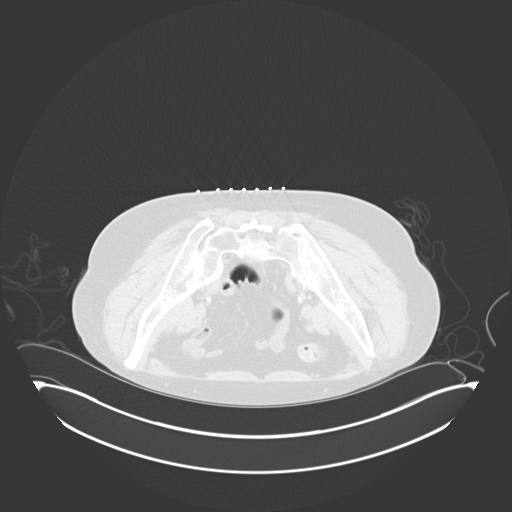
[im 11/26  lung]
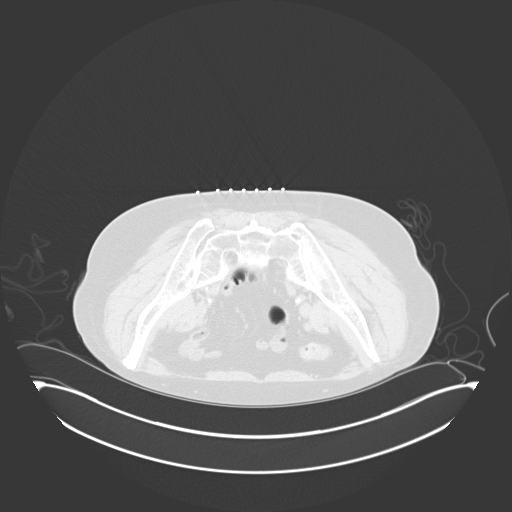
[im 13/26  lung]
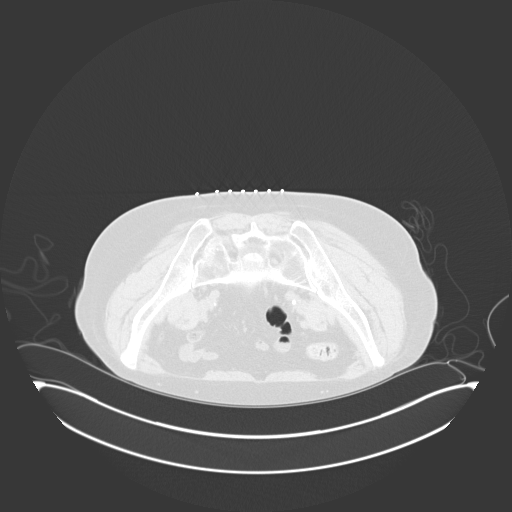
[im 15/26  mediastinal]
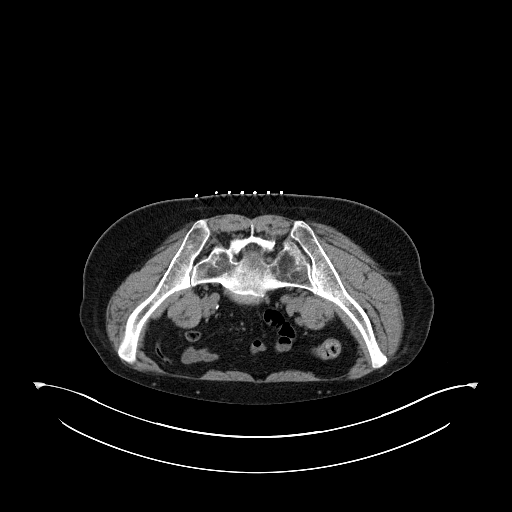
[im 15/26  lung]
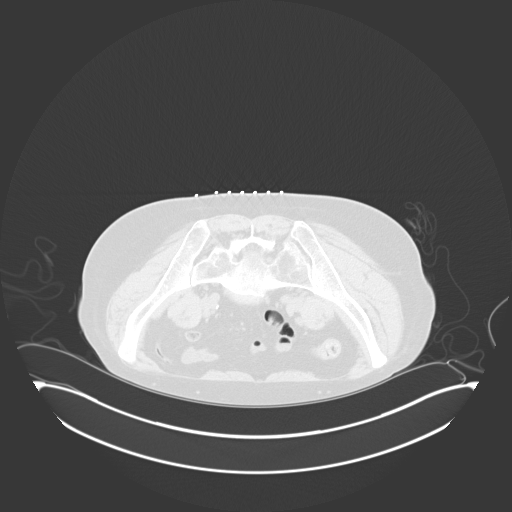
[im 16/26  lung]
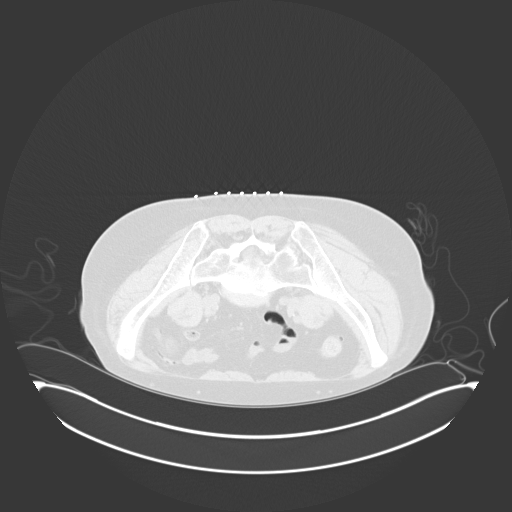
[im 18/26  lung]
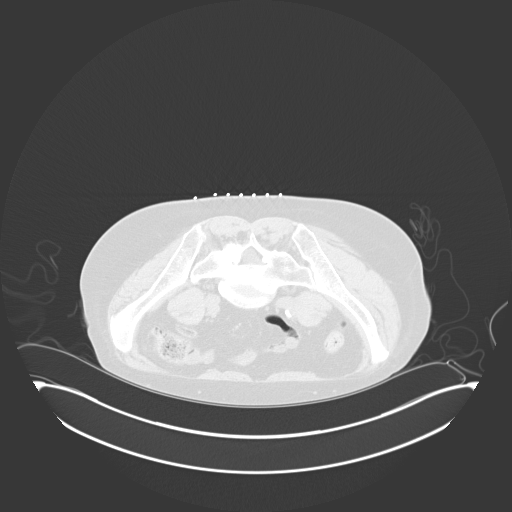
[im 19/26  lung]
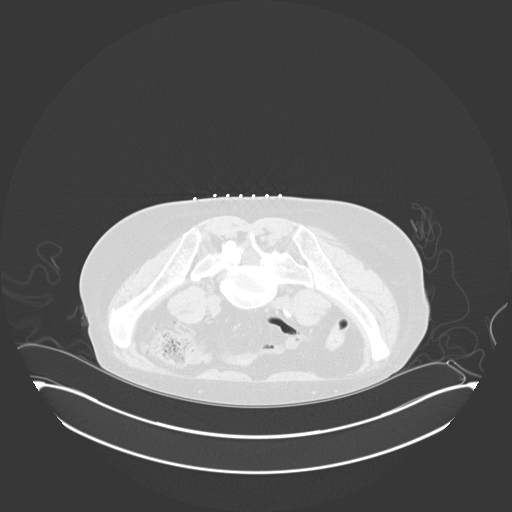
[im 21/26  mediastinal]
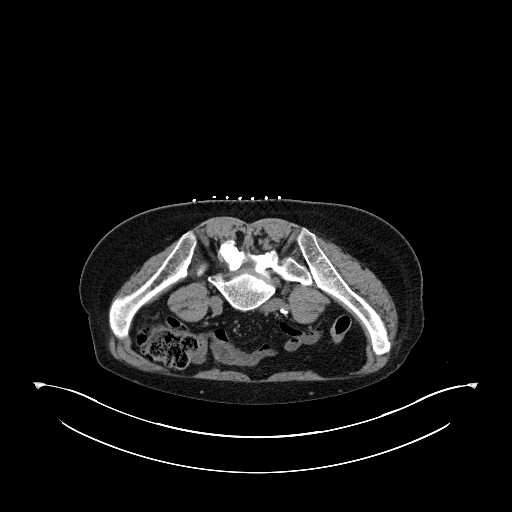
[im 21/26  lung]
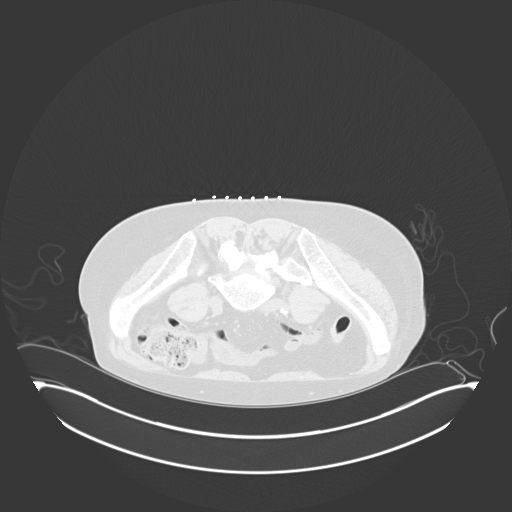
[im 22/26  lung]
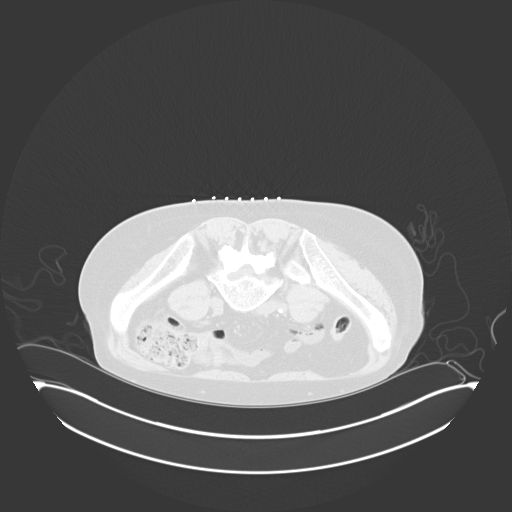
[im 24/26  lung]
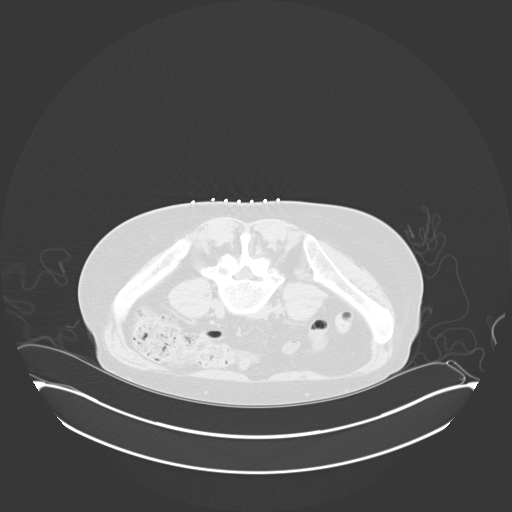

[15 of 28 positions shown; findings below may reference images not displayed]

EXAM:
CT GUIDED BONE MARROW ASPIRATION AND CORE BIOPSY

MEDICATIONS:
None.

ANESTHESIA/SEDATION:
Moderate (conscious) sedation was employed during this procedure. A
total of 2 milligrams versed and 100 micrograms fentanyl were
administered intravenously. The patient's level of consciousness and
vital signs were monitored continuously by radiology nursing
throughout the procedure under my direct supervision.

Total monitored sedation time: 10 minutes

FLUOROSCOPY TIME:  None.

COMPLICATIONS:
None immediate.

Estimated blood loss: <25 mL

PROCEDURE:
Informed written consent was obtained from the patient after a
thorough discussion of the procedural risks, benefits and
alternatives. All questions were addressed. Maximal Sterile Barrier
Technique was utilized including caps, mask, sterile gowns, sterile
gloves, sterile drape, hand hygiene and skin antiseptic. A timeout
was performed prior to the initiation of the procedure.

The patient was positioned prone and non-contrast localization CT
was performed of the pelvis to demonstrate the iliac marrow spaces.

Maximal barrier sterile technique utilized including caps, mask,
sterile gowns, sterile gloves, large sterile drape, hand hygiene,
and betadine prep.


## 2022-03-09 ENCOUNTER — Other Ambulatory Visit: Payer: Self-pay | Admitting: Hematology

## 2022-03-19 ENCOUNTER — Other Ambulatory Visit: Payer: Self-pay | Admitting: Hematology

## 2022-03-19 DIAGNOSIS — C9000 Multiple myeloma not having achieved remission: Secondary | ICD-10-CM

## 2022-03-20 IMAGING — US IR IMAGING GUIDED PORT INSERTION
1 series · 1 of 1 positions shown · non-contrast
Comparison: None.

INDICATION: 60-year-old female with history of multiple myeloma requiring
central venous access for chemotherapy.

EXAM:
IMPLANTED PORT A CATH PLACEMENT WITH ULTRASOUND AND FLUOROSCOPIC
GUIDANCE

[Series 1: (id) · 1 of 1 slices shown]
[im 1/1]
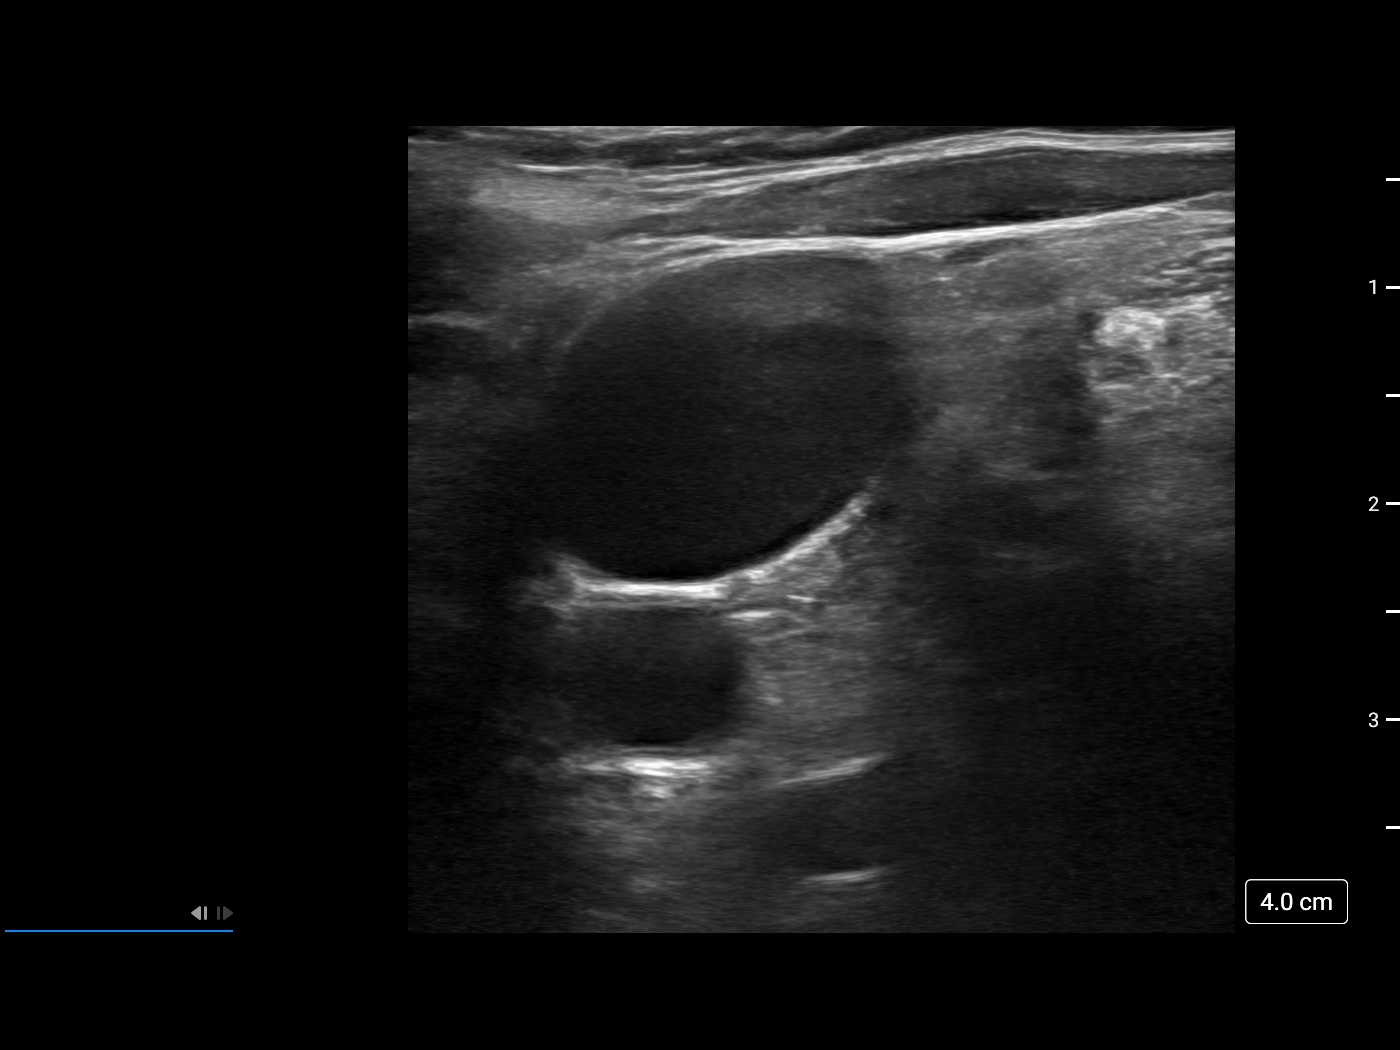

[1 of 1 positions shown; findings below may reference images not displayed]

MEDICATIONS:
None.

ANESTHESIA/SEDATION:
Moderate (conscious) sedation was employed during this procedure. A
total of Versed 4 mg and Fentanyl 100 mcg was administered
intravenously.

Moderate Sedation Time: 15 minutes. The patient's level of
consciousness and vital signs were monitored continuously by
radiology nursing throughout the procedure under my direct
supervision.

CONTRAST:  None

FLUOROSCOPY TIME:  0 minutes, 6 seconds (1 mGy)

COMPLICATIONS:
None immediate.

PROCEDURE:
The procedure, risks, benefits, and alternatives were explained to
the patient. Questions regarding the procedure were encouraged and
answered. The patient understands and consents to the procedure.

The right neck and chest were prepped with chlorhexidine in a
sterile fashion, and a sterile drape was applied covering the
operative field. Maximum barrier sterile technique with sterile
gowns and gloves were used for the procedure. A timeout was
performed prior to the initiation of the procedure.

Ultrasound was used to examine the jugular vein which was
compressible and free of internal echoes. A skin marker was used to
demarcate the planned venotomy and port pocket incision sites. Local
anesthesia was provided to these sites and the subcutaneous tunnel
track with 1% lidocaine with [DATE] epinephrine.

A small incision was created at the jugular access site and blunt
dissection was performed of the subcutaneous tissues. Under real
time ultrasound guidance, the jugular vein was accessed with a 21 ga
micropuncture needle and an 0.018" wire was inserted to the superior
vena cava. A 5 Fr micopuncture set was then used, through which a
0.035" Rosen wire was passed under fluoroscopic guidance into the
inferior vena cava. An 8 Fr dilator was then placed over the wire.

A subcutaneous port pocket was then created along the upper chest
wall utilizing a combination of sharp and blunt dissection. The
pocket was irrigated with sterile saline, packed with gauze, and
observed for hemorrhage. A single lumen "ISP" sized power injectable
port was chosen for placement. The 8 Fr catheter was tunneled from
the port pocket site to the venotomy incision. The port was placed
in the pocket. The external catheter was trimmed to appropriate
length. The dilator was exchanged for an 8 Fr peel-away sheath under
fluoroscopic guidance. The catheter was then placed through the
sheath and the sheath was removed. Final catheter positioning was
confirmed and documented with a fluoroscopic spot radiograph. The
port was accessed with Vasilenvna Hbu needle, aspirated, and flushed with
heparinized saline.

The deep dermal layer of the port pocket incision was closed with
interrupted 3-0 Vicryl suture. The skin was opposed with a running
subcuticular 4-0 Monocryl suture. Dermabond was then placed over the
port pocket and neck incisions. The patient tolerated the procedure
well without immediate post procedural complication.
FINDINGS: After catheter placement, the tip lies within the cavoatrial
junction the catheter aspirates and flushes normally and is ready
for immediate use.
IMPRESSION: Successful placement of a power injectable Port-A-Cath via the right
internal jugular vein. The catheter is ready for immediate use.

## 2022-04-03 ENCOUNTER — Ambulatory Visit: Payer: BC Managed Care – PPO | Admitting: Cardiology

## 2022-04-03 NOTE — Progress Notes (Incomplete)
Clinical Summary Megan Velasquez is a 61 y.o.female  seen today as a new patient for the following medical problems.      1.Leg pain/Discoloration/PAD - history of left common iliac stenting by Dr Unk Lightning 12/24/21 -questionable embolic event to toes, currently on eliquis, also on eliquis given recent stenting.          2.Multiple myeloma    3. Chest pain  -03/30/22 ER visit with chest pain, + cough  - BNP 160, trop neg CXR no acute process  - COVID neg, flu neg Past Medical History:  Diagnosis Date   Cancer ([place])    GERD (gastroesophageal reflux disease)    History of kidney stones    HTN (hypertension)    Multiple myeloma (HCC)    Renal disorder      No Known Allergies   Current Outpatient Medications  Medication Sig Dispense Refill   acetaminophen (TYLENOL) 500 MG tablet Take 1,000 mg by mouth every 6 (six) hours as needed for fever or headache.     ADVAIR HFA 230-21 MCG/ACT inhaler Inhale 2 puffs into the lungs 2 (two) times daily.     Ascorbic Acid (VITAMIN C) 1000 MG tablet Take 1,000 mg by mouth every morning.     b complex vitamins capsule Take 1 capsule by mouth daily. (Patient taking differently: Take 1 capsule by mouth every morning.) 30 capsule 3   Cholecalciferol (VITAMIN D3) 5000 units CAPS Take 5,000 Units by mouth every morning.     clopidogrel (PLAVIX) 75 MG tablet Take 1 tablet (75 mg total) by mouth daily. 30 tablet 11   Cyanocobalamin (VITAMIN B12) 3000 MCG SUBL Place 3,000 mcg under the tongue every morning.     ELIQUIS 2.5 MG TABS tablet Take 1 tablet by mouth twice daily 60 tablet 0   lisinopril-hydrochlorothiazide (ZESTORETIC) 10-12.5 MG tablet Take 1 tablet by mouth daily.     loratadine (CLARITIN) 10 MG tablet Take 10 mg by mouth every morning.     omeprazole (PRILOSEC) 40 MG capsule Take 40 mg by mouth daily as needed (acid reflux).     ondansetron (ZOFRAN) 8 MG tablet Take 1 tablet (8 mg total) by mouth 2 (two) times daily as needed  (Nausea or vomiting). 30 tablet 1   REVLIMID 10 MG capsule TAKE 1 CAPSULE (10 MG) BY MOUTH DAILY FOR 21 DAYS, THEN TAKE 7 DAYS OFF . REPEAT EVERY 28 DAYS 21 capsule 10   rosuvastatin (CRESTOR) 10 MG tablet Take 10 mg by mouth daily.     Saccharomyces boulardii 500 MG PACK Take 500 mg by mouth 2 (two) times daily at 8 am and 10 pm. 20 each 0   No current facility-administered medications for this visit.     Past Surgical History:  Procedure Laterality Date   ABDOMINAL AORTOGRAM W/LOWER EXTREMITY N/A 12/24/2021   Procedure: ABDOMINAL AORTOGRAM W/LOWER EXTREMITY;  Surgeon: Broadus John, MD;  Location: [facility];  Service: Cardiovascular;  Laterality: N/A;   CHOLECYSTECTOMY     COLONOSCOPY  01/2015   Dr. Britta Mccreedy: Mild diverticulosis, sessile polyp ranging 3 to 5 mm removed from the proximal transverse colon, semi-pedunculated polyp 5 to 9 mm in size removed from the sigmoid colon.  Sigmoid colon polyp was serrated adenoma, transverse colon polyp was adenomatous.  Patient was told to have another colonoscopy in 3 years.   COLONOSCOPY WITH PROPOFOL N/A 10/11/2017   Procedure: COLONOSCOPY WITH PROPOFOL;  Surgeon: Daneil Dolin, MD;  [facility];  Service: Endoscopy;  Laterality: N/A;  1:15pm   IR IMAGING GUIDED PORT INSERTION  06/05/2020   PERIPHERAL VASCULAR INTERVENTION  12/24/2021   Procedure: PERIPHERAL VASCULAR INTERVENTION;  Surgeon: Broadus John, MD;  Location: [facility];  Service: Cardiovascular;;   POLYPECTOMY  10/11/2017   Procedure: POLYPECTOMY;  Surgeon: Daneil Dolin, MD;  [facility]: [facility];  Service: Endoscopy;;  descending colon polyps cs times 2     No Known Allergies    Family History  Problem Relation Age of Onset   Heart disease Mother    Diabetes Mother    Lung cancer Father    COPD Father    Colon cancer Neg Hx      Social History Megan Velasquez reports that she quit smoking about 8 years ago. Her smoking use included  cigarettes. She has a 28.00 pack-year smoking history. She has never used smokeless tobacco. Megan Velasquez reports that she does not currently use alcohol.   Review of Systems CONSTITUTIONAL: No weight loss, fever, chills, weakness or fatigue.  HEENT: Eyes: No visual loss, blurred vision, double vision or yellow sclerae.No hearing loss, sneezing, congestion, runny nose or sore throat.  SKIN: No rash or itching.  CARDIOVASCULAR:  RESPIRATORY: No shortness of breath, cough or sputum.  GASTROINTESTINAL: No anorexia, nausea, vomiting or diarrhea. No abdominal pain or blood.  GENITOURINARY: No burning on urination, no polyuria NEUROLOGICAL: No headache, dizziness, syncope, paralysis, ataxia, numbness or tingling in the extremities. No change in bowel or bladder control.  MUSCULOSKELETAL: No muscle, back pain, joint pain or stiffness.  LYMPHATICS: No enlarged nodes. No history of splenectomy.  PSYCHIATRIC: No history of depression or anxiety.  ENDOCRINOLOGIC: No reports of sweating, cold or heat intolerance. No polyuria or polydipsia.  Marland Kitchen   Physical Examination There were no vitals filed for this visit. There were no vitals filed for this visit.  Gen: resting comfortably, no acute distress HEENT: no scleral icterus, pupils equal round and reactive, no palptable cervical adenopathy,  CV Resp: Clear to auscultation bilaterally GI: abdomen is soft, non-tender, non-distended, normal bowel sounds, no hepatosplenomegaly MSK: extremities are warm, no edema.  Skin: warm, no rash Neuro:  no focal deficits Psych: appropriate affect   Diagnostic Studies   08/2021 ABI Summary:  Right: Resting right ankle-brachial index indicates moderate right lower  extremity arterial disease.   Absent waveform on PPG tracing on right toes.  Left: Resting left ankle-brachial index is within normal range. No  evidence of significant left lower extremity arterial disease. The left  toe-brachial index is  abnormal.  10/2021 echo 1. Left ventricular ejection fraction, by estimation, is 70 to 75%. Left  ventricular ejection fraction by 3D volume is 73 %. The left ventricle has  hyperdynamic function. The left ventricle has no regional wall motion  abnormalities. Left ventricular  diastolic parameters were normal. The average left ventricular global  longitudinal strain is -24.3 %. The global longitudinal strain is normal.   2. Right ventricular systolic function is normal. The right ventricular  size is normal.   3. The mitral valve is normal in structure. No evidence of mitral valve  regurgitation. No evidence of mitral stenosis.   4. The aortic valve is normal in structure. Aortic valve regurgitation is  not visualized. No aortic stenosis is present.   5. The inferior vena cava is normal in size with greater than 50%  respiratory variability, suggesting right atrial pressure of 3 mmHg.  Assessment and Plan  1.Leg pain/discoloratinon/PAD - followed by vascular - has been started on statin by pcp, request labs from pcp. Not on ASA since on eliquis - will obtain echo to eval for possilbe embolic source. Reach out to vascular about CTA chest given she had CT PE last year with just scattered atherosclerosis of the aorta.      Arnoldo Lenis, M.D., F.A.C.C.

## 2022-04-04 ENCOUNTER — Emergency Department (HOSPITAL_COMMUNITY)
Admission: EM | Admit: 2022-04-04 | Discharge: 2022-04-05 | Disposition: A | Discharge status: Home / Self Care | Payer: BC Managed Care – PPO | Attending: Emergency Medicine | Admitting: Emergency Medicine

## 2022-04-04 DIAGNOSIS — I10 Essential (primary) hypertension: Secondary | ICD-10-CM | POA: Insufficient documentation

## 2022-04-04 DIAGNOSIS — Z859 Personal history of malignant neoplasm, unspecified: Secondary | ICD-10-CM | POA: Insufficient documentation

## 2022-04-04 DIAGNOSIS — A0472 Enterocolitis due to Clostridium difficile, not specified as recurrent: Secondary | ICD-10-CM | POA: Diagnosis present

## 2022-04-05 ENCOUNTER — Encounter (HOSPITAL_COMMUNITY): Payer: Self-pay | Admitting: Emergency Medicine

## 2022-04-05 ENCOUNTER — Other Ambulatory Visit: Payer: Self-pay

## 2022-04-05 LAB — CBC WITH DIFFERENTIAL/PLATELET
Abs Immature Granulocytes: 0 10*3/uL (ref 0.00–0.07)
Basophils Absolute: 0 10*3/uL (ref 0.0–0.1)
Basophils Relative: 1 %
Eosinophils Absolute: 0 10*3/uL (ref 0.0–0.5)
Eosinophils Relative: 1 %
HCT: 34.7 % — ABNORMAL LOW (ref 36.0–46.0)
Hemoglobin: 11.8 g/dL — ABNORMAL LOW (ref 12.0–15.0)
Immature Granulocytes: 0 %
Lymphocytes Relative: 48 %
Lymphs Abs: 1.2 10*3/uL (ref 0.7–4.0)
MCH: 30.6 pg (ref 26.0–34.0)
MCHC: 34 g/dL (ref 30.0–36.0)
MCV: 89.9 fL (ref 80.0–100.0)
Monocytes Absolute: 0.4 10*3/uL (ref 0.1–1.0)
Monocytes Relative: 15 %
Neutro Abs: 0.9 10*3/uL — ABNORMAL LOW (ref 1.7–7.7)
Neutrophils Relative %: 35 %
Platelets: 129 10*3/uL — ABNORMAL LOW (ref 150–400)
RBC: 3.86 MIL/uL — ABNORMAL LOW (ref 3.87–5.11)
RDW: 13.7 % (ref 11.5–15.5)
WBC: 2.5 10*3/uL — ABNORMAL LOW (ref 4.0–10.5)
nRBC: 0 % (ref 0.0–0.2)

## 2022-04-05 LAB — COMPREHENSIVE METABOLIC PANEL
ALT: 26 U/L (ref 0–44)
AST: 25 U/L (ref 15–41)
Albumin: 3.6 g/dL (ref 3.5–5.0)
Alkaline Phosphatase: 74 U/L (ref 38–126)
Anion gap: 8 (ref 5–15)
BUN: 20 mg/dL (ref 8–23)
CO2: 27 mmol/L (ref 22–32)
Calcium: 8.4 mg/dL — ABNORMAL LOW (ref 8.9–10.3)
Chloride: 100 mmol/L (ref 98–111)
Creatinine, Ser: 0.94 mg/dL (ref 0.44–1.00)
GFR, Estimated: >60 mL/min (ref >60)
Glucose, Bld: 107 mg/dL — ABNORMAL HIGH (ref 70–99)
Potassium: 3.8 mmol/L (ref 3.5–5.1)
Sodium: 135 mmol/L (ref 135–145)
Total Bilirubin: 0.7 mg/dL (ref 0.3–1.2)
Total Protein: 6.3 g/dL — ABNORMAL LOW (ref 6.5–8.1)

## 2022-04-05 LAB — LACTIC ACID, PLASMA: Lactic Acid, Venous: 0.8 mmol/L (ref 0.5–1.9)

## 2022-04-05 LAB — PROTIME-INR
INR: 1.2 (ref 0.8–1.2)
Prothrombin Time: 15.1 seconds (ref 11.4–15.2)

## 2022-04-05 LAB — APTT: aPTT: 28 seconds (ref 24–36)

## 2022-04-05 LAB — LIPASE, BLOOD: Lipase: 49 U/L (ref 11–51)

## 2022-04-05 MED ORDER — SODIUM CHLORIDE 0.9 % IV BOLUS
1000.0000 mL | Freq: Once | INTRAVENOUS | Status: AC
  Administered 2022-04-05: 1000 mL via INTRAVENOUS

## 2022-04-05 MED ORDER — VANCOMYCIN HCL 125 MG PO CAPS: 125.0000 mg | ORAL_CAPSULE | Freq: Four times a day (QID) | ORAL | 0 refills | Status: AC

## 2022-04-05 NOTE — ED Provider Notes (Signed)
[facility] DEPT Provider Note  CSN: [id number] Arrival date & time: 04/04/22 2235  Chief Complaint(s) C diff   HPI Megan Velasquez is a 61 y.o. female    Started on 12/23  Diarrhea Quality:  Watery Severity:  Moderate Onset quality:  Gradual Number of episodes:  4-8 daily Duration:  7 days Progression:  Unchanged Relieved by:  Nothing Worsened by:  Nothing Associated symptoms: no abdominal pain, no recent cough, no fever and no vomiting   Risk factors: recent antibiotic use (multiple rounds of Abx for strep)    Reports that she was called by oncology last night until she tested positive for C. difficile.  She was instructed to come in for treatment.  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   Pancytopenia due to antineoplastic chemotherapy ([place]) 10/08/2020   FUO (fever of unknown origin) 06/10/2020   Hypokalemia 06/10/2020   Hypomagnesemia 06/10/2020   Gastroesophageal reflux disease 06/10/2020   Essential hypertension 06/10/2020   Anemia 06/10/2020   Fever, unknown origin    Dehydration 06/09/2020   Port-A-Cath in place 06/06/2020   Multiple myeloma not having achieved remission ([place]) 05/30/2020   Counseling regarding advance care planning and goals of care 05/30/2020   Hx of adenomatous colonic polyps 08/27/2017   Hemorrhoid 08/27/2017   Home Medication(s) Prior to Admission medications   Medication Sig Start Date End Date Taking? Authorizing Provider  vancomycin (VANCOCIN) 125 MG capsule Take 1 capsule (125 mg total) by mouth 4 (four) times  daily for 10 days. 04/05/22 04/15/22 Yes Avett Reineck, Grayce Sessions, MD  acetaminophen (TYLENOL) 500 MG tablet Take 1,000 mg by mouth every 6 (six) hours as needed for fever or headache.    [provider]  ADVAIR HFA 230-21 MCG/ACT inhaler Inhale 2 puffs into the lungs 2 (two) times daily. 03/24/21   [provider]  Ascorbic Acid (VITAMIN C) 1000 MG tablet Take 1,000 mg by mouth every morning.    [provider]  b complex vitamins capsule Take 1 capsule by mouth daily. Patient taking differently: Take 1 capsule by mouth every morning. 04/25/20   Brunetta Genera, MD  Cholecalciferol (VITAMIN D3) 5000 units CAPS Take 5,000 Units by mouth every morning.    [provider]  clopidogrel (PLAVIX) 75 MG tablet Take 1 tablet (75 mg total) by mouth daily. 12/24/21 12/24/22  Broadus John, MD  Cyanocobalamin (VITAMIN B12) 3000 MCG SUBL Place 3,000 mcg under the tongue every morning.    [provider]  ELIQUIS 2.5 MG TABS tablet Take 1 tablet by mouth twice daily 03/09/22   Brunetta Genera, MD  lisinopril-hydrochlorothiazide (ZESTORETIC) 10-12.5 MG tablet Take 1 tablet by mouth daily. 12/22/21   [provider]  loratadine (CLARITIN) 10 MG tablet Take 10 mg by mouth every morning.    [provider]  omeprazole (PRILOSEC) 40 MG capsule Take 40 mg by mouth daily as needed (acid reflux). 01/31/20   [provider]  ondansetron (ZOFRAN) 8 MG tablet Take 1 tablet (8 mg total) by mouth 2 (two)  times daily as needed (Nausea or vomiting). 08/27/21   Brunetta Genera, MD  REVLIMID 10 MG capsule TAKE 1 CAPSULE (10 MG) BY MOUTH DAILY FOR 21 DAYS, THEN TAKE 7 DAYS OFF . REPEAT EVERY 28 DAYS 03/23/22   Brunetta Genera, MD  rosuvastatin (CRESTOR) 10 MG tablet Take 10 mg by mouth daily. 09/18/21   [provider]  Saccharomyces boulardii 500 MG PACK Take 500 mg by mouth 2 (two) times daily at 8 am and 10 pm. 07/24/21   Brunetta Genera, MD                                                                                                                                    Allergies Patient has no known allergies.  Review of Systems Review of Systems  Constitutional:  Negative for fever.  Gastrointestinal:  Positive for diarrhea. Negative for abdominal pain and vomiting.   As noted in HPI  Physical Exam Vital Signs  I have reviewed the triage vital signs BP 97/61   Pulse (!) 56   Temp 98.7 F (37.1 C) (Oral)   Resp 16   Ht _0  (1.651 m)   Wt 58.1 kg   SpO2 98%   BMI 21.30 kg/m   Physical Exam Vitals reviewed.  Constitutional:      General: She is not in acute distress.    Appearance: She is well-developed. She is not diaphoretic.  HENT:     Head: Normocephalic and atraumatic.     Right Ear: External ear normal.     Left Ear: External ear normal.     Nose: Nose normal.  Eyes:     General: No scleral icterus.    Conjunctiva/sclera: Conjunctivae normal.  Neck:     Trachea: Phonation normal.  Cardiovascular:     Rate and Rhythm: Normal rate and regular rhythm.  Pulmonary:     Effort: Pulmonary effort is normal. No respiratory distress.     Breath sounds: No stridor.  Abdominal:     General: There is no distension.  Musculoskeletal:        General: Normal range of motion.     Cervical back: Normal range of motion.  Neurological:     Mental Status: She is alert and oriented to person, place, and time.  Psychiatric:        Behavior: Behavior normal.     ED Results and Treatments Labs (all labs ordered are listed, but only abnormal results are displayed) Labs Reviewed  COMPREHENSIVE METABOLIC PANEL - Abnormal; Notable for the following components:      Result Value   Glucose, Bld 107 (*)    Calcium 8.4 (*)    Total Protein 6.3 (*)    All other components within normal limits  CBC WITH DIFFERENTIAL/PLATELET - Abnormal; Notable for the following components:   WBC 2.5 (*)    RBC 3.86  (*)  Hemoglobin 11.8 (*)    HCT 34.7 (*)    Platelets 129 (*)    Neutro Abs 0.9 (*)    All other components within normal limits  C DIFFICILE QUICK SCREEN W PCR REFLEX    CULTURE, BLOOD (ROUTINE X 2)  CULTURE, BLOOD (ROUTINE X 2)  LACTIC ACID, PLASMA  PROTIME-INR  APTT  LIPASE, BLOOD  LACTIC ACID, PLASMA  URINALYSIS, ROUTINE W REFLEX MICROSCOPIC  I-STAT CHEM 8, ED                                                                                                                         EKG  EKG Interpretation  Date/Time:    Ventricular Rate:    PR Interval:    QRS Duration:   QT Interval:    QTC Calculation:   R Axis:     Text Interpretation:         Radiology No results found.  Medications Ordered in ED Medications  sodium chloride 0.9 % bolus 1,000 mL (0 mLs Intravenous Stopped 04/05/22 0730)                                                                                                                                     Procedures Procedures  (including critical care time)  Medical Decision Making / ED Course   Medical Decision Making Amount and/or Complexity of Data Reviewed Labs: ordered.  Risk Prescription drug management.    Diarrhea with reported positive C. difficile test.. Unable to confirm via care everywhere. No note from oncology regarding the telephone call.  Will obtain labs and C. Difficile.  CBC without leukocytosis.  Mild anemia Metabolic panel without significant insufficiency. Patient unable to have a bowel movement throughout the night  Consulted oncology Dr. Annitta Jersey, who spoke with prior attending confirming call for + c.diff. They recommended treating. Given reassuring workup, patient would be appropriate for patient management.       Final Clinical Impression(s) / ED Diagnoses Final diagnoses:  C. difficile diarrhea   The patient appears reasonably screened and/or stabilized for discharge and I doubt any other medical  condition or other [facility] requiring further screening, evaluation, or treatment in the ED at this time. I have discussed the findings, Dx and Tx plan with the patient/family who expressed understanding and agree(s) with the plan. Discharge instructions discussed at length. The patient/family was given strict return precautions  who verbalized understanding of the instructions. No further questions at time of discharge.  Disposition: Discharge  Condition: Good  ED Discharge Orders          Ordered    vancomycin (VANCOCIN) 125 MG capsule  4 times daily        04/05/22 0734             Follow Up: Brunetta Genera, MD [place] [id number] [phone]  Call  to schedule an appointment for close follow up           This chart was dictated using voice recognition software.  Despite best efforts to proofread,  errors can occur which can change the documentation meaning.    Fatima Blank, MD 04/05/22 [phone]

## 2022-04-05 NOTE — ED Triage Notes (Signed)
Pt arriving POV after being told by her doctor that she needed to come here to be treated for C diff.

## 2022-04-06 ENCOUNTER — Other Ambulatory Visit: Payer: Self-pay | Admitting: Hematology

## 2022-04-07 ENCOUNTER — Ambulatory Visit: Payer: BC Managed Care – PPO | Attending: Cardiology | Admitting: Nurse Practitioner

## 2022-04-07 ENCOUNTER — Encounter: Payer: Self-pay | Admitting: Nurse Practitioner

## 2022-04-07 VITALS — BP 112/54 | HR 68 | Ht 65.0 in | Wt 125.2 lb

## 2022-04-07 DIAGNOSIS — I739 Peripheral vascular disease, unspecified: Secondary | ICD-10-CM

## 2022-04-07 DIAGNOSIS — C9000 Multiple myeloma not having achieved remission: Secondary | ICD-10-CM

## 2022-04-07 DIAGNOSIS — D61810 Antineoplastic chemotherapy induced pancytopenia: Secondary | ICD-10-CM

## 2022-04-07 DIAGNOSIS — I10 Essential (primary) hypertension: Secondary | ICD-10-CM | POA: Diagnosis not present

## 2022-04-07 DIAGNOSIS — T451X5A Adverse effect of antineoplastic and immunosuppressive drugs, initial encounter: Secondary | ICD-10-CM

## 2022-04-07 NOTE — Patient Instructions (Signed)
Medication Instructions:  Your physician recommends that you continue on your current medications as directed. Please refer to the Current Medication list given to you today.  *If you need a refill on your cardiac medications before your next appointment, please call your pharmacy*   Lab Work: NONE   If you have labs (blood work) drawn today and your tests are completely normal, you will receive your results only by: [facility] (if you have MyChart) OR A paper copy in the mail If you have any lab test that is abnormal or we need to change your treatment, we will call you to review the results.   Testing/Procedures: NONE    Follow-Up: At [facility], you and your health needs are our priority.  As part of our continuing mission to provide you with exceptional heart care, we have created designated Provider Care Teams.  These Care Teams include your primary Cardiologist (physician) and Advanced Practice Providers (APPs -  Physician Assistants and Nurse Practitioners) who all work together to provide you with the care you need, when you need it.  We recommend signing up for the patient portal called "MyChart".  Sign up information is provided on this After Visit Summary.  MyChart is used to connect with patients for Virtual Visits (Telemedicine).  Patients are able to view lab/test results, encounter notes, upcoming appointments, etc.  Non-urgent messages can be sent to your provider as well.   To learn more about what you can do with MyChart, go to [email or web address].    Your next appointment:   6 month(s)  The format for your next appointment:   In Person  Provider:   Carlyle Dolly, MD    Other Instructions Thank you for choosing [facility]!    Important Information About Sugar

## 2022-04-07 NOTE — Progress Notes (Signed)
Cardiology Office Note:    Date:  04/07/2022   ID:  Megan Velasquez, DOB May 21, 1960, MRN [id number]  PCP:  [name], MD   [facility] Cardiologist:  Carlyle Dolly, MD     Referring MD: [name], MD   CC: Here for follow-up  History of Present Illness:    Megan Velasquez is a 62 y.o. female with a hx of the following:  PAD, status post angiogram and stenting of RCIA 12/2021 Multiple myeloma Hypertension GERD Pancytopenia Former smoker  Patient is a delightful 62 year old female with past medical history as mentioned above.  Followed by vascular due to history of PAD.  Previous ABIs revealed moderate right-sided disease and CTA showed aortic bifurcation disease.  Was started on rosuvastatin by PCP this year.  Last seen by Dr. Carlyle Dolly in June 2023.  Echocardiogram was updated and was normal without any significant findings. Told to follow up in 6 months. Underwent angiogram and stenting of RCIA in 12/2021. Closely followed by [facility].   Presented to [facility] ED on 03/30/2022 with CC of chest pain. Also noted frequent cough that was worsening.  Chest x-ray was negative.  EKG showed sinus rhythm.  Patient received DuoNeb and albuterol inhaler.  Was discharged on Zithromax.  Was discharged in stable condition.  Later in December 2023 she tested positive for C. difficile.  She presented to [facility], ED for treatment.  And was discharged in stable condition with prescription for vancomycin.  Today she presents for follow-up.  She states she is doing okay.  Denies chest pain at ED visit at [facility] she tested positive for influenza A, and has been recovering well.  Does admit to lingering cough.  Denies any chest pain, shortness of breath, palpitations, syncope, presyncope, dizziness, orthopnea, PND, swelling or significant weight changes.  Denies any acute bleeding issues while on Eliquis and Plavix.  Tolerating her medications well.   Denies any other questions or concerns today.  Past Medical History:  Diagnosis Date   Cancer ([place])    GERD (gastroesophageal reflux disease)    History of kidney stones    HTN (hypertension)    Multiple myeloma ([place])    Renal disorder     Past Surgical History:  Procedure Laterality Date   ABDOMINAL AORTOGRAM W/LOWER EXTREMITY N/A 12/24/2021   Procedure: ABDOMINAL AORTOGRAM W/LOWER EXTREMITY;  Surgeon: Broadus John, MD;  Location: [place] [facility];  Service: Cardiovascular;  Laterality: N/A;   CHOLECYSTECTOMY     COLONOSCOPY  01/2015   Dr. Britta Mccreedy: Mild diverticulosis, sessile polyp ranging 3 to 5 mm removed from the proximal transverse colon, semi-pedunculated polyp 5 to 9 mm in size removed from the sigmoid colon.  Sigmoid colon polyp was serrated adenoma, transverse colon polyp was adenomatous.  Patient was told to have another colonoscopy in 3 years.   COLONOSCOPY WITH PROPOFOL N/A 10/11/2017   Procedure: COLONOSCOPY WITH PROPOFOL;  Surgeon: Daneil Dolin, MD;  [facility];  Service: Endoscopy;  Laterality: N/A;  1:15pm   IR IMAGING GUIDED PORT INSERTION  06/05/2020   PERIPHERAL VASCULAR INTERVENTION  12/24/2021   Procedure: PERIPHERAL VASCULAR INTERVENTION;  Surgeon: Broadus John, MD;  Location: [place] [facility];  Service: Cardiovascular;;   POLYPECTOMY  10/11/2017   Procedure: POLYPECTOMY;  Surgeon: Daneil Dolin, MD;  Location: [facility];  Service: Endoscopy;;  descending colon polyps cs times 2    Current Medications: Current Meds  Medication  Sig   acetaminophen (TYLENOL) 500 MG tablet Take 1,000 mg by mouth every 6 (six) hours as needed for fever or headache.   ADVAIR HFA 230-21 MCG/ACT inhaler Inhale 2 puffs into the lungs 2 (two) times daily.   Ascorbic Acid (VITAMIN C) 1000 MG tablet Take 1,000 mg by mouth every morning.   b complex vitamins capsule Take 1 capsule by mouth daily. (Patient taking differently: Take 1 capsule by mouth every morning.)    Cholecalciferol (VITAMIN D3) 5000 units CAPS Take 5,000 Units by mouth every morning.   clopidogrel (PLAVIX) 75 MG tablet Take 1 tablet (75 mg total) by mouth daily.   Cyanocobalamin (VITAMIN B12) 3000 MCG SUBL Place 3,000 mcg under the tongue every morning.   ELIQUIS 2.5 MG TABS tablet Take 1 tablet by mouth twice daily   lisinopril-hydrochlorothiazide (ZESTORETIC) 10-12.5 MG tablet Take 1 tablet by mouth daily.   loratadine (CLARITIN) 10 MG tablet Take 10 mg by mouth every morning.   omeprazole (PRILOSEC) 40 MG capsule Take 40 mg by mouth daily as needed (acid reflux).   REVLIMID 10 MG capsule TAKE 1 CAPSULE (10 MG) BY MOUTH DAILY FOR 21 DAYS, THEN TAKE 7 DAYS OFF . REPEAT EVERY 28 DAYS   rosuvastatin (CRESTOR) 10 MG tablet Take 10 mg by mouth daily.   Saccharomyces boulardii 500 MG PACK Take 500 mg by mouth 2 (two) times daily at 8 am and 10 pm.   vancomycin (VANCOCIN) 125 MG capsule Take 1 capsule (125 mg total) by mouth 4 (four) times daily for 10 days.     Allergies:   Patient has no known allergies.   Social History   Socioeconomic History   Marital status: Married    Spouse name: Not on file   Number of children: Not on file   Years of education: Not on file   Highest education level: Not on file  Occupational History   Not on file  Tobacco Use   Smoking status: Former    Packs/day: 1.00    Years: 28.00    Total pack years: 28.00    Types: Cigarettes    Quit date: 10/08/2013    Years since quitting: 8.5   Smokeless tobacco: Never  Vaping Use   Vaping Use: Never used  Substance and Sexual Activity   Alcohol use: Not Currently   Drug use: Never   Sexual activity: Yes    Birth control/protection: Post-menopausal  Other Topics Concern   Not on file  Social History Narrative   Not on file   Social Determinants of Health   Financial Resource Strain: Low Risk  (06/06/2020)   Overall Financial Resource Strain (CARDIA)    Difficulty of Paying Living Expenses: Not hard  at all  Food Insecurity: No Food Insecurity (06/06/2020)   Hunger Vital Sign    Worried About Running Out of Food in the Last Year: Never true    [place] in the Last Year: Never true  Transportation Needs: No Transportation Needs (06/06/2020)   PRAPARE - Hydrologist (Medical): No    Lack of Transportation (Non-Medical): No  Physical Activity: Not on file  Stress: No Stress Concern Present (06/06/2020)   [place]    Feeling of Stress : Only a little  Social Connections: Unknown (06/06/2020)   Social Connection and Isolation Panel [NHANES]    Frequency of Communication with Friends and Family: More than three times a  week    Frequency of Social Gatherings with Friends and Family: More than three times a week    Attends Religious Services: Not on Advertising copywriter or Organizations: Not on file    Attends Archivist Meetings: Not on file    Marital Status: Married     Family History: The patient's family history includes COPD in her father; Diabetes in her mother; Heart disease in her mother; Lung cancer in her father. There is no history of Colon cancer.  ROS:   Review of Systems  Constitutional: Negative.   HENT: Negative.    Eyes: Negative.   Respiratory:  Positive for cough. Negative for hemoptysis, sputum production, shortness of breath and wheezing.   Cardiovascular: Negative.   Gastrointestinal: Negative.   Genitourinary: Negative.   Skin: Negative.   Neurological: Negative.   Endo/Heme/Allergies: Negative.   Psychiatric/Behavioral: Negative.      Please see the history of present illness.    All other systems reviewed and are negative.  EKGs/Labs/Other Studies Reviewed:    The following studies were reviewed today:   EKG:  EKG is not ordered today.    ABIs on January 23, 2022: Right ABIs appear increased. Left ABIs appear essentially unchanged   compared to prior study on 12/12/2021.    Summary:  Right: Resting right ankle-brachial index is within normal range. The  right toe-brachial index is normal.   Left: Resting left ankle-brachial index is within normal range. The left  toe-brachial index is normal.  Vascular ultrasound aorta/iliacs/IVC Doppler limited on January 23, 2022: Right common iliac with stent, no stenosis.  Lower extremity arterial ultrasound on December 29, 2021: A mixed echogenic structure measuring approximately 2.2 cm x 0.9 cm is visualized at the left groin at site of not with ultrasound, characteristics of hematoma.  No evidence of pseudoaneurysm, aVF, or DVT.  Aortogram on 12/24/2021: Impression: Successful stenting of critically stenosed right common iliac artery using an 8 x 29 mm Gore VBX.  This was likely the nidus of previous atheroembolic events.   Plan: Will see the patient in 1 month with abdominal duplex ultrasound to interrogate right-sided stent, as well as ABI.  ABI's on 12/12/2021: Summary:  Right: Resting right ankle-brachial index indicates moderate right lower  extremity arterial disease. The right toe-brachial index is abnormal.   Left: Resting left ankle-brachial index is within normal range. The left  toe-brachial index is normal.   Echocardiogram on 10/10/2021: 1. Left ventricular ejection fraction, by estimation, is 70 to 75%. Left  ventricular ejection fraction by 3D volume is 73 %. The left ventricle has  hyperdynamic function. The left ventricle has no regional wall motion  abnormalities. Left ventricular  diastolic parameters were normal. The average left ventricular global  longitudinal strain is -24.3 %. The global longitudinal strain is normal.   2. Right ventricular systolic function is normal. The right ventricular  size is normal.   3. The mitral valve is normal in structure. No evidence of mitral valve  regurgitation. No evidence of mitral stenosis.   4. The aortic  valve is normal in structure. Aortic valve regurgitation is  not visualized. No aortic stenosis is present.   5. The inferior vena cava is normal in size with greater than 50%  respiratory variability, suggesting right atrial pressure of 3 mmHg.    Recent Labs: 04/05/2022: ALT 26; BUN 20; Creatinine, Ser 0.94; Hemoglobin 11.8; Platelets 129; Potassium 3.8; Sodium 135  Recent Lipid Panel  No results found for: "CHOL", "TRIG", "HDL", "CHOLHDL", "VLDL", "LDLCALC", "LDLDIRECT"   Physical Exam:    VS:  BP (!) 112/54   Pulse 68   Ht _0  (1.651 m)   Wt 125 lb 3.2 oz (56.8 kg)   SpO2 99%   BMI 20.83 kg/m     Wt Readings from Last 3 Encounters:  04/07/22 125 lb 3.2 oz (56.8 kg)  04/05/22 128 lb (58.1 kg)  02/24/22 129 lb 5.1 oz (58.7 kg)     GEN: Well nourished, well developed in no acute distress HEENT: Normal NECK: No JVD; No carotid bruits CARDIAC: S1/S2, RRR, no murmurs, rubs, gallops; 2+ peripheral pulses throughout, strong bilaterally RESPIRATORY:  Clear to auscultation without rales, wheezing or rhonchi  MUSCULOSKELETAL:  No edema; No deformity  SKIN: Warm and dry NEUROLOGIC:  Alert and oriented x 3 PSYCHIATRIC:  Normal affect   ASSESSMENT:    1. PAD (peripheral artery disease) ([place])   2. Hypertension, unspecified type   3. Multiple myeloma not having achieved remission (HCC)   4. Pancytopenia due to antineoplastic chemotherapy ([place])    PLAN:    In order of problems listed above:  PAD, s/p aortogram and stenting of RCIA Closely followed by vascular.  Underwent stenting of right common iliac artery in September 2023.  Tolerating medications well.  Denies any bleeding issues.  Continue Plavix and low-dose Eliquis, managed by vascular.  Denies any leg pain or claudication. Heart healthy diet and regular cardiovascular exercise encouraged.   Hypertension Blood pressure stable and BP well-controlled at home.  Continue Zestoretic. Heart healthy diet and regular  cardiovascular exercise encouraged.   Multiple myeloma, pancytopenia Being managed by Dr. Irene Limbo.  Continue follow-up with cancer center.  Continue current medication regimen. Heart healthy diet and regular cardiovascular exercise encouraged. Continue to follow with PCP.  Disposition: Follow-up with Dr. Carlyle Dolly in 6 months or sooner if anything changes.   Medication Adjustments/Labs and Tests Ordered: Current medicines are reviewed at length with the patient today.  Concerns regarding medicines are outlined above.  No orders of the defined types were placed in this encounter.  No orders of the defined types were placed in this encounter.   Patient Instructions  Medication Instructions:  Your physician recommends that you continue on your current medications as directed. Please refer to the Current Medication list given to you today.  *If you need a refill on your cardiac medications before your next appointment, please call your pharmacy*   Lab Work: NONE   If you have labs (blood work) drawn today and your tests are completely normal, you will receive your results only by: [place] (if you have MyChart) OR A paper copy in the mail If you have any lab test that is abnormal or we need to change your treatment, we will call you to review the results.   Testing/Procedures: NONE    Follow-Up: At [facility], you and your health needs are our priority.  As part of our continuing mission to provide you with exceptional heart care, we have created designated Provider Care Teams.  These Care Teams include your primary Cardiologist (physician) and Advanced Practice Providers (APPs -  Physician Assistants and Nurse Practitioners) who all work together to provide you with the care you need, when you need it.  We recommend signing up for the patient portal called "MyChart".  Sign up information is provided on this After Visit Summary.  MyChart is used to connect with  patients  for Virtual Visits (Telemedicine).  Patients are able to view lab/test results, encounter notes, upcoming appointments, etc.  Non-urgent messages can be sent to your provider as well.   To learn more about what you can do with MyChart, go to [email or web address].    Your next appointment:   6 month(s)  The format for your next appointment:   In Person  Provider:   Carlyle Dolly, MD    Other Instructions Thank you for choosing [facility]!    Important Information About Sugar         Signed, Finis Bud, NP  04/07/2022 10:28 AM    [place]

## 2022-04-10 LAB — CULTURE, BLOOD (ROUTINE X 2)
Culture: NO GROWTH
Culture: NO GROWTH
Special Requests: ADEQUATE

## 2022-04-17 ENCOUNTER — Other Ambulatory Visit: Payer: Self-pay | Admitting: Hematology

## 2022-04-17 DIAGNOSIS — C9000 Multiple myeloma not having achieved remission: Secondary | ICD-10-CM

## 2022-04-22 ENCOUNTER — Other Ambulatory Visit: Payer: Self-pay

## 2022-04-22 ENCOUNTER — Encounter: Payer: Self-pay | Admitting: Hematology

## 2022-04-27 ENCOUNTER — Inpatient Hospital Stay: Payer: BC Managed Care – PPO | Attending: Hematology

## 2022-04-27 ENCOUNTER — Other Ambulatory Visit: Payer: Self-pay

## 2022-04-27 ENCOUNTER — Inpatient Hospital Stay: Payer: BC Managed Care – PPO | Admitting: Hematology

## 2022-04-27 VITALS — BP 137/70 | HR 72 | Temp 97.7°F | Resp 18 | Wt 128.4 lb

## 2022-04-27 DIAGNOSIS — C9001 Multiple myeloma in remission: Secondary | ICD-10-CM

## 2022-04-27 DIAGNOSIS — Z87891 Personal history of nicotine dependence: Secondary | ICD-10-CM | POA: Insufficient documentation

## 2022-04-27 DIAGNOSIS — Z95828 Presence of other vascular implants and grafts: Secondary | ICD-10-CM

## 2022-04-27 DIAGNOSIS — C9000 Multiple myeloma not having achieved remission: Secondary | ICD-10-CM | POA: Insufficient documentation

## 2022-04-27 LAB — CBC WITH DIFFERENTIAL/PLATELET
Abs Immature Granulocytes: 0.01 10*3/uL (ref 0.00–0.07)
Basophils Absolute: 0 10*3/uL (ref 0.0–0.1)
Basophils Relative: 1 %
Eosinophils Absolute: 0.1 10*3/uL (ref 0.0–0.5)
Eosinophils Relative: 3 %
HCT: 37.2 % (ref 36.0–46.0)
Hemoglobin: 12.7 g/dL (ref 12.0–15.0)
Immature Granulocytes: 0 %
Lymphocytes Relative: 34 %
Lymphs Abs: 1.1 10*3/uL (ref 0.7–4.0)
MCH: 31.3 pg (ref 26.0–34.0)
MCHC: 34.1 g/dL (ref 30.0–36.0)
MCV: 91.6 fL (ref 80.0–100.0)
Monocytes Absolute: 0.5 10*3/uL (ref 0.1–1.0)
Monocytes Relative: 15 %
Neutro Abs: 1.5 10*3/uL — ABNORMAL LOW (ref 1.7–7.7)
Neutrophils Relative %: 47 %
Platelets: 151 10*3/uL (ref 150–400)
RBC: 4.06 MIL/uL (ref 3.87–5.11)
RDW: 14.8 % (ref 11.5–15.5)
WBC: 3.3 10*3/uL — ABNORMAL LOW (ref 4.0–10.5)
nRBC: 0 % (ref 0.0–0.2)

## 2022-04-27 LAB — CMP (CANCER CENTER ONLY)
ALT: 21 U/L (ref 0–44)
AST: 20 U/L (ref 15–41)
Albumin: 4.1 g/dL (ref 3.5–5.0)
Alkaline Phosphatase: 74 U/L (ref 38–126)
Anion gap: 7 (ref 5–15)
BUN: 21 mg/dL (ref 8–23)
CO2: 26 mmol/L (ref 22–32)
Calcium: 9.3 mg/dL (ref 8.9–10.3)
Chloride: 103 mmol/L (ref 98–111)
Creatinine: 1.13 mg/dL — ABNORMAL HIGH (ref 0.44–1.00)
GFR, Estimated: 55 mL/min — ABNORMAL LOW (ref >60)
Glucose, Bld: 93 mg/dL (ref 70–99)
Potassium: 4.1 mmol/L (ref 3.5–5.1)
Sodium: 136 mmol/L (ref 135–145)
Total Bilirubin: 0.8 mg/dL (ref 0.3–1.2)
Total Protein: 6.3 g/dL — ABNORMAL LOW (ref 6.5–8.1)

## 2022-04-27 MED ORDER — SODIUM CHLORIDE 0.9% FLUSH
10.0000 mL | Freq: Once | INTRAVENOUS | Status: AC
  Administered 2022-04-27: 10 mL

## 2022-04-27 MED ORDER — HEPARIN SOD (PORK) LOCK FLUSH 100 UNIT/ML IV SOLN
500.0000 [IU] | Freq: Once | INTRAVENOUS | Status: AC
  Administered 2022-04-27: 500 [IU]

## 2022-04-27 NOTE — Progress Notes (Signed)
HEMATOLOGY/ONCOLOGY CLINIC NOTE  Date of Service: 04/27/2022  Patient Care Team: [name], MD as PCP - General (Family Medicine) [name], [name], MD as PCP - Cardiology (Cardiology) [name], FNP as PCP - Family Medicine (Family Medicine) [name], [name], MD as Consulting Physician (Gastroenterology)  CHIEF COMPLAINTS/PURPOSE OF CONSULTATION:  Continued follow-up for evaluation and management of multiple myeloma  HISTORY OF PRESENTING ILLNESS:  Please see previous note for details on initial presentation  INTERVAL HISTORY Megan Velasquez is a 62 y.o. female is here for continued evaluation and management of her multiple myeloma. She is here with her grand daughter during this visit.   Patient was last seen by me on 02/24/2022 and she complained of mild bilateral leg pain.  Patient notes she had influenza around 03/30/2022 and was not treated with Tamiflu. She notes that she tested positive for C. Difficile diarrhea and was seen at ED on 04/04/2022. She has been taking Vancomysin 125 mg and notes her diarrhea is well controlled.   She complains of loss of vision on left eye, which is not a new symptom, but reports that it has worsen. She notes that her Ophthalmologist has retired and is currently trying to find a new Ophthalmologist.   Patient also complains of tingling sensation on bilateral hands due to the cold weather. She notes that her tingling sensation worsens at night when she sleeps.   Patient is complaint with Plavix 75 mg and Eliquis 2.5 mg.  She denies fever, night sweats, chills, abdominal pain, diarrhea, chest pain, unexpected weight loss, or leg swelling.    MEDICAL HISTORY:  Past Medical History:  Diagnosis Date   Cancer ([place])    GERD (gastroesophageal reflux disease)    History of kidney stones    HTN (hypertension)    Multiple myeloma ([place])    Renal disorder     SURGICAL HISTORY: Past Surgical History:  Procedure Laterality Date    ABDOMINAL AORTOGRAM W/LOWER EXTREMITY N/A 12/24/2021   Procedure: ABDOMINAL AORTOGRAM W/LOWER EXTREMITY;  Surgeon: Broadus John, MD;  Location: [facility];  Service: Cardiovascular;  Laterality: N/A;   CHOLECYSTECTOMY     COLONOSCOPY  01/2015   Dr. Britta Mccreedy: Mild diverticulosis, sessile polyp ranging 3 to 5 mm removed from the proximal transverse colon, semi-pedunculated polyp 5 to 9 mm in size removed from the sigmoid colon.  Sigmoid colon polyp was serrated adenoma, transverse colon polyp was adenomatous.  Patient was told to have another colonoscopy in 3 years.   COLONOSCOPY WITH PROPOFOL N/A 10/11/2017   Procedure: COLONOSCOPY WITH PROPOFOL;  Surgeon: Daneil Dolin, MD;  [facility];  Service: Endoscopy;  Laterality: N/A;  1:15pm   IR IMAGING GUIDED PORT INSERTION  06/05/2020   PERIPHERAL VASCULAR INTERVENTION  12/24/2021   Procedure: PERIPHERAL VASCULAR INTERVENTION;  Surgeon: Broadus John, MD;  Location: [facility];  Service: Cardiovascular;;   POLYPECTOMY  10/11/2017   Procedure: POLYPECTOMY;  Surgeon: Daneil Dolin, MD;  [facility]: [facility];  Service: Endoscopy;;  descending colon polyps cs times 2    SOCIAL HISTORY: Social History   Socioeconomic History   Marital status: Married    Spouse name: Not on file   Number of children: Not on file   Years of education: Not on file   Highest education level: Not on file  Occupational History   Not on file  Tobacco Use   Smoking status: Former    Packs/day: 1.00  Years: 28.00    Total pack years: 28.00    Types: Cigarettes    Quit date: 10/08/2013    Years since quitting: 8.5   Smokeless tobacco: Never  Vaping Use   Vaping Use: Never used  Substance and Sexual Activity   Alcohol use: Not Currently   Drug use: Never   Sexual activity: Yes    Birth control/protection: Post-menopausal  Other Topics Concern   Not on file  Social History Narrative   Not on file   Social Determinants of Health    Financial Resource Strain: Low Risk  (06/06/2020)   Overall Financial Resource Strain (CARDIA)    Difficulty of Paying Living Expenses: Not hard at all  Food Insecurity: No Food Insecurity (06/06/2020)   Hunger Vital Sign    Worried About Running Out of Food in the Last Year: Never true    [place] in the Last Year: Never true  Transportation Needs: No Transportation Needs (06/06/2020)   PRAPARE - Hydrologist (Medical): No    Lack of Transportation (Non-Medical): No  Physical Activity: Not on file  Stress: No Stress Concern Present (06/06/2020)   [place]    Feeling of Stress : Only a little  Social Connections: Unknown (06/06/2020)   Social Connection and Isolation Panel [NHANES]    Frequency of Communication with Friends and Family: More than three times a week    Frequency of Social Gatherings with Friends and Family: More than three times a week    Attends Religious Services: Not on Advertising copywriter or Organizations: Not on file    Attends Archivist Meetings: Not on file    Marital Status: Married  Human resources officer Violence: Not on file    FAMILY HISTORY: Family History  Problem Relation Age of Onset   Heart disease Mother    Diabetes Mother    Lung cancer Father    COPD Father    Colon cancer Neg Hx     ALLERGIES:  has No Known Allergies.  MEDICATIONS:  Current Outpatient Medications  Medication Sig Dispense Refill   REVLIMID 10 MG capsule TAKE 1 CAPSULE (10 MG) BY MOUTH DAILY FOR 21 DAYS, THEN TAKE 7 DAYS OFF 21 capsule 0   acetaminophen (TYLENOL) 500 MG tablet Take 1,000 mg by mouth every 6 (six) hours as needed for fever or headache.     ADVAIR HFA 230-21 MCG/ACT inhaler Inhale 2 puffs into the lungs 2 (two) times daily.     Ascorbic Acid (VITAMIN C) 1000 MG tablet Take 1,000 mg by mouth every morning.     b complex vitamins capsule Take 1  capsule by mouth daily. (Patient taking differently: Take 1 capsule by mouth every morning.) 30 capsule 3   Cholecalciferol (VITAMIN D3) 5000 units CAPS Take 5,000 Units by mouth every morning.     clopidogrel (PLAVIX) 75 MG tablet Take 1 tablet (75 mg total) by mouth daily. 30 tablet 11   Cyanocobalamin (VITAMIN B12) 3000 MCG SUBL Place 3,000 mcg under the tongue every morning.     ELIQUIS 2.5 MG TABS tablet Take 1 tablet by mouth twice daily 60 tablet 0   lisinopril-hydrochlorothiazide (ZESTORETIC) 10-12.5 MG tablet Take 1 tablet by mouth daily.     loratadine (CLARITIN) 10 MG tablet Take 10 mg by mouth every morning.     omeprazole (PRILOSEC) 40 MG capsule Take 40 mg  by mouth daily as needed (acid reflux).     ondansetron (ZOFRAN) 8 MG tablet Take 1 tablet (8 mg total) by mouth 2 (two) times daily as needed (Nausea or vomiting). (Patient not taking: Reported on 04/07/2022) 30 tablet 1   rosuvastatin (CRESTOR) 10 MG tablet Take 10 mg by mouth daily.     Saccharomyces boulardii 500 MG PACK Take 500 mg by mouth 2 (two) times daily at 8 am and 10 pm. 20 each 0   No current facility-administered medications for this visit.    REVIEW OF SYSTEMS:   10 Point review of Systems was done is negative except as noted above.  PHYSICAL EXAMINATION:  ECOG PERFORMANCE STATUS: 1 - Symptomatic but completely ambulatory .BP 137/70   Pulse 72   Temp 97.7 F (36.5 C)   Resp 18   Wt 128 lb 6.4 oz (58.2 kg)   SpO2 99%   BMI 21.37 kg/m  NAD GENERAL:alert, in no acute distress and comfortable SKIN: no acute rashes, no significant lesions EYES: conjunctiva are pink and non-injected, sclera anicteric NECK: supple, no JVD LYMPH:  no palpable lymphadenopathy in the cervical, axillary or inguinal regions LUNGS: clear to auscultation b/l with normal respiratory effort HEART: regular rate & rhythm ABDOMEN:  normoactive bowel sounds , non tender, not distended. Extremity: no pedal edema PSYCH: alert &  oriented x 3 with fluent speech NEURO: no focal motor/sensory deficits  LABORATORY DATA:  I have reviewed the data as listed  .    Latest Ref Rng & Units 04/27/2022    8:47 AM 04/05/2022    2:07 AM 02/24/2022    9:33 AM  CBC  WBC 4.0 - 10.5 K/uL 3.3  2.5  3.3   Hemoglobin 12.0 - 15.0 g/dL 12.7  11.8  12.8   Hematocrit 36.0 - 46.0 % 37.2  34.7  37.3   Platelets 150 - 400 K/uL 151  129  146     .    Latest Ref Rng & Units 04/27/2022    8:47 AM 04/05/2022    2:07 AM 02/24/2022    9:33 AM  CMP  Glucose 70 - 99 mg/dL 93  107  113   BUN 8 - 23 mg/dL '21  20  18   '$ Creatinine 0.44 - 1.00 mg/dL 1.13  0.94  0.91   Sodium 135 - 145 mmol/L 136  135  138   Potassium 3.5 - 5.1 mmol/L 4.1  3.8  3.8   Chloride 98 - 111 mmol/L 103  100  105   CO2 22 - 32 mmol/L '26  27  27   '$ Calcium 8.9 - 10.3 mg/dL 9.3  8.4  8.9   Total Protein 6.5 - 8.1 g/dL 6.3  6.3  6.3   Total Bilirubin 0.3 - 1.2 mg/dL 0.8  0.7  0.7   Alkaline Phos 38 - 126 U/L 74  74  66   AST 15 - 41 U/L '20  25  21   '$ ALT 0 - 44 U/L '21  26  16      '$ Most recent lab results (03/15/2020) of CBC is as follows: all values are WNL except for RBC at 3.66, Hgb at 9.7, HCT at 31.8, MCHC at 30.5, RDW Standard Deviation 58.7, Red Cell Distribution Width At 18.6,  BUN at 19, Creatinine at 1.06, Total Protein at 10.2, Albumin at 2.7, AST at 14,. 03/13/2020 UPEP shows no M-Spike, all values are WNL 03/06/2020 SPEP shows Total Protein at 10.1, Beta Globulin  at 5.2, M-Spike at 4.3, Total Globulin at 6.4, A/G Ratio at 0.6 03/06/2020 Free Kappa Light Chains at 181.1, Free Lambda Light Chains Serum at 3.6, K/L ratio at 50.31 03/06/2020 Beta-2 Microglobulin at 2.5           RADIOGRAPHIC STUDIES: I have personally reviewed the radiological images as listed and agreed with the findings in the report. No results found.  ASSESSMENT & PLAN:   62 year old female here for continued follow-up, evaluation and management of multiple myeloma  #1   IgA kappa multiple myeloma status post induction treatment with carfilzomib Revlimid dexamethasone  presenting with anemia hemoglobin 9.4. Creatinine 1.28 No hypercalcemia Bone survey with no obvious skeletal lesions. Baseline M spike of 4.3 g/dL. Beta-2 Microglobulin at 2.5 Mol Cy Monosomy 13 and dup 1q  2) status post stem cell collection assisted with G-CSF and Plerixafor. Currently on maintenance Revlimid. Currently had declined proceeding with consolidative autologous HSCT  3) Diarrhea - ? Related to Revlimid.  Mild and nearly resolved GI panel and c diff neg30  PLAN: -Discussed lab results from today, 04/27/2022, with the patient. CBC show slightly decreased WBC of 3.3 K, mild leukopenia. CMP stable.  -Myeloma Panel no M spike -Answered all of patient's questions about her blood thinner medication, C. Diff, and other general questions.  -Patient reports no significant toxicities from her current dose of Revlimid at this time. -Patient has no symptom suggestive of disease progression  FOLLOW UP: RTC with Dr Irene Limbo with labs in 2 months  The total time spent in the appointment was 25 minutes* .  All of the patient's questions were answered with apparent satisfaction. The patient knows to call the clinic with any problems, questions or concerns.   Sullivan Lone MD MS AAHIVMS [facility] Hematology/Oncology Physician [facility]  .*Total Encounter Time as defined by the Centers for Medicare and Medicaid Services includes, in addition to the face-to-face time of a patient visit (documented in the note above) non-face-to-face time: obtaining and reviewing outside history, ordering and reviewing medications, tests or procedures, care coordination (communications with other health care professionals or caregivers) and documentation in the medical record.   I, Cleda Mccreedy, am acting as a Education administrator for Sullivan Lone, MD.  .I have reviewed the above documentation for accuracy and  completeness, and I agree with the above. Brunetta Genera MD

## 2022-04-30 LAB — MULTIPLE MYELOMA PANEL, SERUM
Albumin SerPl Elph-Mcnc: 3.8 g/dL (ref 2.9–4.4)
Albumin/Glob SerPl: 1.6 (ref 0.7–1.7)
Alpha 1: 0.2 g/dL (ref 0.0–0.4)
Alpha2 Glob SerPl Elph-Mcnc: 0.7 g/dL (ref 0.4–1.0)
B-Globulin SerPl Elph-Mcnc: 0.9 g/dL (ref 0.7–1.3)
Gamma Glob SerPl Elph-Mcnc: 0.7 g/dL (ref 0.4–1.8)
Globulin, Total: 2.5 g/dL (ref 2.2–3.9)
IgA: 112 mg/dL (ref 87–352)
IgG (Immunoglobin G), Serum: 703 mg/dL (ref 586–1602)
IgM (Immunoglobulin M), Srm: 20 mg/dL — ABNORMAL LOW (ref 26–217)
Total Protein ELP: 6.3 g/dL (ref 6.0–8.5)

## 2022-05-03 ENCOUNTER — Encounter: Payer: Self-pay | Admitting: Hematology

## 2022-05-10 ENCOUNTER — Other Ambulatory Visit: Payer: Self-pay | Admitting: Hematology

## 2022-05-20 ENCOUNTER — Other Ambulatory Visit: Payer: Self-pay | Admitting: Hematology

## 2022-05-20 DIAGNOSIS — C9000 Multiple myeloma not having achieved remission: Secondary | ICD-10-CM

## 2022-06-03 ENCOUNTER — Encounter: Payer: Self-pay | Admitting: *Deleted

## 2022-06-07 ENCOUNTER — Other Ambulatory Visit: Payer: Self-pay | Admitting: Hematology

## 2022-06-12 ENCOUNTER — Other Ambulatory Visit: Payer: Self-pay | Admitting: Hematology

## 2022-06-12 DIAGNOSIS — C9000 Multiple myeloma not having achieved remission: Secondary | ICD-10-CM

## 2022-06-13 ENCOUNTER — Emergency Department (HOSPITAL_COMMUNITY): Payer: BC Managed Care – PPO

## 2022-06-13 ENCOUNTER — Other Ambulatory Visit: Payer: Self-pay

## 2022-06-13 ENCOUNTER — Emergency Department (HOSPITAL_COMMUNITY)
Admission: EM | Admit: 2022-06-13 | Discharge: 2022-06-13 | Disposition: A | Discharge status: Home / Self Care | Payer: BC Managed Care – PPO | Attending: Emergency Medicine | Admitting: Emergency Medicine

## 2022-06-13 ENCOUNTER — Encounter (HOSPITAL_COMMUNITY): Payer: Self-pay | Admitting: Emergency Medicine

## 2022-06-13 DIAGNOSIS — R051 Acute cough: Secondary | ICD-10-CM | POA: Diagnosis not present

## 2022-06-13 DIAGNOSIS — Z7902 Long term (current) use of antithrombotics/antiplatelets: Secondary | ICD-10-CM | POA: Diagnosis not present

## 2022-06-13 DIAGNOSIS — Z7901 Long term (current) use of anticoagulants: Secondary | ICD-10-CM | POA: Diagnosis not present

## 2022-06-13 DIAGNOSIS — Z20822 Contact with and (suspected) exposure to covid-19: Secondary | ICD-10-CM | POA: Diagnosis not present

## 2022-06-13 DIAGNOSIS — R197 Diarrhea, unspecified: Secondary | ICD-10-CM

## 2022-06-13 DIAGNOSIS — E876 Hypokalemia: Secondary | ICD-10-CM

## 2022-06-13 DIAGNOSIS — R059 Cough, unspecified: Secondary | ICD-10-CM | POA: Diagnosis present

## 2022-06-13 LAB — RESP PANEL BY RT-PCR (RSV, FLU A&B, COVID)  RVPGX2
Influenza A by PCR: NEGATIVE
Influenza B by PCR: NEGATIVE
Resp Syncytial Virus by PCR: NEGATIVE
SARS Coronavirus 2 by RT PCR: NEGATIVE

## 2022-06-13 LAB — COMPREHENSIVE METABOLIC PANEL
ALT: 23 U/L (ref 0–44)
AST: 23 U/L (ref 15–41)
Albumin: 3.7 g/dL (ref 3.5–5.0)
Alkaline Phosphatase: 59 U/L (ref 38–126)
Anion gap: 8 (ref 5–15)
BUN: 11 mg/dL (ref 8–23)
CO2: 30 mmol/L (ref 22–32)
Calcium: 8.5 mg/dL — ABNORMAL LOW (ref 8.9–10.3)
Chloride: 96 mmol/L — ABNORMAL LOW (ref 98–111)
Creatinine, Ser: 0.96 mg/dL (ref 0.44–1.00)
GFR, Estimated: >60 mL/min (ref >60)
Glucose, Bld: 127 mg/dL — ABNORMAL HIGH (ref 70–99)
Potassium: 3.3 mmol/L — ABNORMAL LOW (ref 3.5–5.1)
Sodium: 134 mmol/L — ABNORMAL LOW (ref 135–145)
Total Bilirubin: 0.9 mg/dL (ref 0.3–1.2)
Total Protein: 6.4 g/dL — ABNORMAL LOW (ref 6.5–8.1)

## 2022-06-13 LAB — URINALYSIS, ROUTINE W REFLEX MICROSCOPIC
Bilirubin Urine: NEGATIVE
Glucose, UA: NEGATIVE mg/dL
Hgb urine dipstick: NEGATIVE
Ketones, ur: NEGATIVE mg/dL
Leukocytes,Ua: NEGATIVE
Nitrite: NEGATIVE
Protein, ur: NEGATIVE mg/dL
Specific Gravity, Urine: 1.008 (ref 1.005–1.030)
pH: 6 (ref 5.0–8.0)

## 2022-06-13 LAB — CBC
HCT: 39.5 % (ref 36.0–46.0)
Hemoglobin: 13.8 g/dL (ref 12.0–15.0)
MCH: 31.6 pg (ref 26.0–34.0)
MCHC: 34.9 g/dL (ref 30.0–36.0)
MCV: 90.4 fL (ref 80.0–100.0)
Platelets: 129 10*3/uL — ABNORMAL LOW (ref 150–400)
RBC: 4.37 MIL/uL (ref 3.87–5.11)
RDW: 13.3 % (ref 11.5–15.5)
WBC: 3.3 10*3/uL — ABNORMAL LOW (ref 4.0–10.5)
nRBC: 0 % (ref 0.0–0.2)

## 2022-06-13 LAB — LIPASE, BLOOD: Lipase: 57 U/L — ABNORMAL HIGH (ref 11–51)

## 2022-06-13 LAB — MAGNESIUM: Magnesium: 1.7 mg/dL (ref 1.7–2.4)

## 2022-06-13 LAB — LACTIC ACID, PLASMA: Lactic Acid, Venous: 1.4 mmol/L (ref 0.5–1.9)

## 2022-06-13 MED ORDER — SODIUM CHLORIDE 0.9 % IV BOLUS
1000.0000 mL | Freq: Once | INTRAVENOUS | Status: AC
  Administered 2022-06-13: 1000 mL via INTRAVENOUS

## 2022-06-13 MED ORDER — MAGNESIUM SULFATE 2 GM/50ML IV SOLN
2.0000 g | Freq: Once | INTRAVENOUS | Status: AC
  Administered 2022-06-13: 2 g via INTRAVENOUS
  Filled 2022-06-13: qty 50

## 2022-06-13 MED ORDER — ONDANSETRON HCL 4 MG/2ML IJ SOLN
4.0000 mg | Freq: Once | INTRAMUSCULAR | Status: AC
  Administered 2022-06-13: 4 mg via INTRAVENOUS
  Filled 2022-06-13: qty 2

## 2022-06-13 MED ORDER — POTASSIUM CHLORIDE CRYS ER 20 MEQ PO TBCR
40.0000 meq | EXTENDED_RELEASE_TABLET | Freq: Once | ORAL | Status: AC
  Administered 2022-06-13: 40 meq via ORAL
  Filled 2022-06-13: qty 2

## 2022-06-13 NOTE — ED Triage Notes (Signed)
Pt arrived via POV. Pt has had diarrhea and nausea for over a week. Was at ER for same 4x days ago. Pt feeling fatigued.  AOx4

## 2022-06-13 NOTE — ED Provider Triage Note (Addendum)
Emergency Medicine Provider Triage Evaluation Note  Megan Velasquez , a 62 y.o. female  was evaluated in triage.  Pt complains of nausea, diarrhea, cough, onset 5 days ago (day after starting zmax). No vomiting. Reports 2-3 episodes of loose stools. No recent travel. +Recent abx for RSV (Keflex, zithromax). Took Imodium without relief  Hx multiple myeloma, chemo pill at night, in remission.  Review of Systems  Positive:  Negative: Vomiting, fevers  Physical Exam  BP 127/74 (BP Location: Left Arm)   Pulse 86   Temp 98.4 F (36.9 C) (Oral)   Resp 17   SpO2 96%  Gen:   Awake, no distress   Resp:  Normal effort  MSK:   Moves extremities without difficulty  Other:    Medical Decision Making  Medically screening exam initiated at 12:26 PM.  Appropriate orders placed.  Yzabel Majure was informed that the remainder of the evaluation will be completed by another provider, this initial triage assessment does not replace that evaluation, and the importance of remaining in the ED until their evaluation is complete.     Tacy Learn, PA-C 06/13/22 1226    Tacy Learn, PA-C 06/13/22 1226    Tacy Learn, PA-C 06/13/22 1227

## 2022-06-13 NOTE — ED Provider Notes (Signed)
Megan Velasquez   CSN: [id number] Arrival date & time: 06/13/22  1127     History {Add pertinent medical, surgical, social history, OB history to HPI:1} Chief Complaint  Patient presents with   Diarrhea   Nausea    Megan Velasquez is a 62 y.o. female.  She has a history of multiple myeloma on oral chemotherapy.  She started with a cough about a week ago and was put on azithromycin.  She began having diarrhea multiple episodes a day and nausea.  No fever.  She went to [place] few days ago feeling worse and they diagnosed her with a UTI and dehydration.  She was put on Keflex.  She continues to feel terrible and weak, still having diarrhea and still has a cough.  Her COVID and flu RSV test were negative.  She does not endorse any urine symptoms.  Cough is productive of some yellow sputum.  The history is provided by the patient.  Diarrhea Quality:  Watery Severity:  Moderate Onset quality:  Gradual Duration:  5 days Timing:  Intermittent Progression:  Unchanged Relieved by:  None tried Worsened by:  Nothing Ineffective treatments:  None tried Associated symptoms: cough   Associated symptoms: no abdominal pain, no diaphoresis, no fever and no vomiting   Risk factors: recent antibiotic use        Home Medications Prior to Admission medications   Medication Sig Start Date End Date Taking? Authorizing Provider  acetaminophen (TYLENOL) 500 MG tablet Take 1,000 mg by mouth every 6 (six) hours as needed for fever or headache.    [provider]  ADVAIR HFA 230-21 MCG/ACT inhaler Inhale 2 puffs into the lungs 2 (two) times daily. 03/24/21   [provider]  Ascorbic Acid (VITAMIN C) 1000 MG tablet Take 1,000 mg by mouth every morning.    [provider]  b complex vitamins capsule Take 1 capsule by mouth daily. Patient taking differently: Take 1 capsule by mouth every morning. 04/25/20   Brunetta Genera, MD  Cholecalciferol (VITAMIN D3) 5000 units CAPS Take 5,000 Units by mouth every morning.    [provider]  clopidogrel (PLAVIX) 75 MG tablet Take 1 tablet (75 mg total) by mouth daily. 12/24/21 12/24/22  Broadus John, MD  Cyanocobalamin (VITAMIN B12) 3000 MCG SUBL Place 3,000 mcg under the tongue every morning.    [provider]  ELIQUIS 2.5 MG TABS tablet Take 1 tablet by mouth twice daily 06/08/22   Barbee Cough, MD  lisinopril-hydrochlorothiazide (ZESTORETIC) 10-12.5 MG tablet Take 1 tablet by mouth daily. 12/22/21   [provider]  loratadine (CLARITIN) 10 MG tablet Take 10 mg by mouth every morning.    [provider]  omeprazole (PRILOSEC) 40 MG capsule Take 40 mg by mouth daily as needed (acid reflux). 01/31/20   [provider]  ondansetron (ZOFRAN) 8 MG tablet Take 1 tablet (8 mg total) by mouth 2 (two) times daily as needed (Nausea or vomiting). Patient not taking: Reported on 04/07/2022 08/27/21   Brunetta Genera, MD  REVLIMID 10 MG capsule TAKE 1 CAPSULE BY MOUTH DAILY FOR 21 DAYS, THEN TAKE 7 DAYS OFF 05/20/22   Brunetta Genera, MD  rosuvastatin (CRESTOR) 10 MG tablet Take 10 mg by mouth daily. 09/18/21   [provider]  Saccharomyces boulardii 500 MG PACK Take 500 mg by mouth 2 (two) times daily at 8 am and 10 pm. 07/24/21  Brunetta Genera, MD      Allergies    Patient has no known allergies.    Review of Systems   Review of Systems  Constitutional:  Positive for fatigue. Negative for diaphoresis and fever.  Respiratory:  Positive for cough.   Cardiovascular:  Negative for chest pain.  Gastrointestinal:  Positive for diarrhea. Negative for abdominal pain and vomiting.  Genitourinary:  Negative for dysuria.    Physical Exam Updated Vital Signs BP 127/74 (BP Location: Left Arm)   Pulse 86   Temp 98.4 F (36.9 C) (Oral)   Resp 17   SpO2 96%  Physical Exam Vitals and nursing Velasquez  reviewed.  Constitutional:      General: She is not in acute distress.    Appearance: Normal appearance. She is well-developed.  HENT:     Head: Normocephalic and atraumatic.  Eyes:     Conjunctiva/sclera: Conjunctivae normal.  Cardiovascular:     Rate and Rhythm: Normal rate and regular rhythm.     Heart sounds: No murmur heard. Pulmonary:     Effort: Pulmonary effort is normal. No respiratory distress.     Breath sounds: Normal breath sounds.     Comments: Port in right upper chest without significant erythema or tenderness. Abdominal:     Palpations: Abdomen is soft.     Tenderness: There is no abdominal tenderness. There is no guarding or rebound.  Musculoskeletal:        General: No deformity.     Cervical back: Neck supple.  Skin:    General: Skin is warm and dry.     Capillary Refill: Capillary refill takes less than 2 seconds.  Neurological:     General: No focal deficit present.     Mental Status: She is alert.     ED Results / Procedures / Treatments   Labs (all labs ordered are listed, but only abnormal results are displayed) Labs Reviewed  RESP PANEL BY RT-PCR (RSV, FLU A&B, COVID)  [id number]  GASTROINTESTINAL PANEL BY PCR, STOOL (REPLACES STOOL CULTURE)  C DIFFICILE QUICK SCREEN W PCR REFLEX    LIPASE, BLOOD  COMPREHENSIVE METABOLIC PANEL  CBC  URINALYSIS, ROUTINE W REFLEX MICROSCOPIC  MAGNESIUM  LACTIC ACID, PLASMA  LACTIC ACID, PLASMA    EKG None  Radiology No results found.  Procedures Procedures  {Document cardiac monitor, telemetry assessment procedure when appropriate:1}  Medications Ordered in ED Medications  sodium chloride 0.9 % bolus 1,000 mL (has no administration in time range)    ED Course/ Medical Decision Making/ A&P Clinical Course as of 06/13/22 1441  Sat Jun 13, 2022  1440 Chest x-ray interpreted by me as COPD no acute infiltrate.  Awaiting radiology reading. [MB]    Clinical Course User Index [MB] Hayden Rasmussen, MD    {   Click here for ABCD2, HEART and other calculatorsREFRESH Velasquez before signing :1}                          Medical Decision Making Amount and/or Complexity of Data Reviewed Labs: ordered. Radiology: ordered.  Risk Prescription drug management.   This patient complains of ***; this involves an extensive number of treatment Options and is a complaint that carries with it a high risk of complications and morbidity. The differential includes ***  I ordered, reviewed and interpreted labs, which included *** I ordered medication *** and reviewed PMP when indicated. I ordered imaging studies which included *** and  I independently    visualized and interpreted imaging which showed *** Additional history obtained from *** Previous records obtained and reviewed *** I consulted *** and discussed lab and imaging findings and discussed disposition.  Cardiac monitoring reviewed, *** Social determinants considered, *** Critical Interventions: ***  After the interventions stated above, I reevaluated the patient and found *** Admission and further testing considered, ***   {Document critical care time when appropriate:1} {Document review of labs and clinical decision tools ie heart score, Chads2Vasc2 etc:1}  {Document your independent review of radiology images, and any outside records:1} {Document your discussion with family members, caretakers, and with consultants:1} {Document social determinants of health affecting pt's care:1} {Document your decision making why or why not admission, treatments were needed:1} Final Clinical Impression(s) / ED Diagnoses Final diagnoses:  None    Rx / DC Orders ED Discharge Orders     None

## 2022-06-13 NOTE — ED Notes (Signed)
Unable to give urine sample at this time.  

## 2022-06-16 ENCOUNTER — Other Ambulatory Visit: Payer: Self-pay

## 2022-06-16 ENCOUNTER — Encounter: Payer: Self-pay | Admitting: Hematology

## 2022-06-24 ENCOUNTER — Inpatient Hospital Stay: Payer: BC Managed Care – PPO | Attending: Hematology

## 2022-06-24 ENCOUNTER — Inpatient Hospital Stay: Payer: BC Managed Care – PPO | Admitting: Hematology

## 2022-06-24 VITALS — BP 119/68 | HR 71 | Temp 98.1°F | Resp 18 | Ht 65.0 in | Wt 126.5 lb

## 2022-06-24 DIAGNOSIS — C9001 Multiple myeloma in remission: Secondary | ICD-10-CM

## 2022-06-24 DIAGNOSIS — Z87891 Personal history of nicotine dependence: Secondary | ICD-10-CM | POA: Insufficient documentation

## 2022-06-24 DIAGNOSIS — Z95828 Presence of other vascular implants and grafts: Secondary | ICD-10-CM

## 2022-06-24 DIAGNOSIS — C9000 Multiple myeloma not having achieved remission: Secondary | ICD-10-CM | POA: Insufficient documentation

## 2022-06-24 LAB — CBC WITH DIFFERENTIAL/PLATELET
Abs Immature Granulocytes: 0.01 10*3/uL (ref 0.00–0.07)
Basophils Absolute: 0.1 10*3/uL (ref 0.0–0.1)
Basophils Relative: 1 %
Eosinophils Absolute: 0 10*3/uL (ref 0.0–0.5)
Eosinophils Relative: 1 %
HCT: 37.2 % (ref 36.0–46.0)
Hemoglobin: 12.8 g/dL (ref 12.0–15.0)
Immature Granulocytes: 0 %
Lymphocytes Relative: 22 %
Lymphs Abs: 1.2 10*3/uL (ref 0.7–4.0)
MCH: 31.7 pg (ref 26.0–34.0)
MCHC: 34.4 g/dL (ref 30.0–36.0)
MCV: 92.1 fL (ref 80.0–100.0)
Monocytes Absolute: 0.5 10*3/uL (ref 0.1–1.0)
Monocytes Relative: 9 %
Neutro Abs: 3.6 10*3/uL (ref 1.7–7.7)
Neutrophils Relative %: 67 %
Platelets: 236 10*3/uL (ref 150–400)
RBC: 4.04 MIL/uL (ref 3.87–5.11)
RDW: 13.8 % (ref 11.5–15.5)
WBC: 5.4 10*3/uL (ref 4.0–10.5)
nRBC: 0 % (ref 0.0–0.2)

## 2022-06-24 LAB — CMP (CANCER CENTER ONLY)
ALT: 14 U/L (ref 0–44)
AST: 15 U/L (ref 15–41)
Albumin: 4 g/dL (ref 3.5–5.0)
Alkaline Phosphatase: 77 U/L (ref 38–126)
Anion gap: 7 (ref 5–15)
BUN: 18 mg/dL (ref 8–23)
CO2: 25 mmol/L (ref 22–32)
Calcium: 9 mg/dL (ref 8.9–10.3)
Chloride: 103 mmol/L (ref 98–111)
Creatinine: 0.89 mg/dL (ref 0.44–1.00)
GFR, Estimated: >60 mL/min (ref >60)
Glucose, Bld: 118 mg/dL — ABNORMAL HIGH (ref 70–99)
Potassium: 3.9 mmol/L (ref 3.5–5.1)
Sodium: 135 mmol/L (ref 135–145)
Total Bilirubin: 0.4 mg/dL (ref 0.3–1.2)
Total Protein: 6.4 g/dL — ABNORMAL LOW (ref 6.5–8.1)

## 2022-06-24 MED ORDER — HEPARIN SOD (PORK) LOCK FLUSH 100 UNIT/ML IV SOLN
500.0000 [IU] | Freq: Once | INTRAVENOUS | Status: AC
  Administered 2022-06-24: 500 [IU]

## 2022-06-24 MED ORDER — SODIUM CHLORIDE 0.9% FLUSH
10.0000 mL | Freq: Once | INTRAVENOUS | Status: AC
  Administered 2022-06-24: 10 mL

## 2022-06-24 NOTE — Progress Notes (Signed)
HEMATOLOGY/ONCOLOGY CLINIC NOTE  Date of Service: 06/24/2022  Patient Care Team: [name], FNP as PCP - General (Family Medicine) [name], [name], MD as PCP - Cardiology (Cardiology) [name], FNP as PCP - Family Medicine (Family Medicine) [name], [name], MD as Consulting Physician (Gastroenterology)  CHIEF COMPLAINTS/PURPOSE OF CONSULTATION:  Continued follow-up for evaluation and management of multiple myeloma  HISTORY OF PRESENTING ILLNESS:  Please see previous note for details on initial presentation  INTERVAL HISTORY  Megan Velasquez is a 62 y.o. female is here for continued evaluation and management of her multiple myeloma. Patient was last seen by me on 04/27/2022 and complained of worsening vision loss in her left eye and tingling in her bilateral hands, but was doing well otherwise.  Today, she is accompanied by her husband. She reports that she was recently sick with an infection earlier this month associated with her respiratory system . She is unsure of the exact infection type, but denies COVID-19, influenza, RSV, or pneumonia. She was given Z-Pak antibiotics which gave her severe diarrhea. She did not hold her Revlimid during the time of her infection and she was never on any steroids. She did receive IV fluids due to dehydration. Her husband does note that she did lose some weight during the time of her infection. She has approximately 4 pounds left to gain in order to reach stability again. She reports that she did receive a pneumonia vaccination last year.  She continues to experience weakness and fatigue, but this has improved. She continues to have a dry cough, but it is no longer productive. Her diarrhea has also improved. Patient denies any low level diarrhea with Revlimid.  She denies any eye itching/redness, but she did experience some soreness in her right forehead. She denies any new bone pain, new skin rashes, back pain, or major medication  changes.  MEDICAL HISTORY:  Past Medical History:  Diagnosis Date   Cancer ([place])    GERD (gastroesophageal reflux disease)    History of kidney stones    HTN (hypertension)    Multiple myeloma ([place])    Renal disorder     SURGICAL HISTORY: Past Surgical History:  Procedure Laterality Date   ABDOMINAL AORTOGRAM W/LOWER EXTREMITY N/A 12/24/2021   Procedure: ABDOMINAL AORTOGRAM W/LOWER EXTREMITY;  Surgeon: Broadus John, MD;  Location: [facility];  Service: Cardiovascular;  Laterality: N/A;   CHOLECYSTECTOMY     COLONOSCOPY  01/2015   Dr. Britta Mccreedy: Mild diverticulosis, sessile polyp ranging 3 to 5 mm removed from the proximal transverse colon, semi-pedunculated polyp 5 to 9 mm in size removed from the sigmoid colon.  Sigmoid colon polyp was serrated adenoma, transverse colon polyp was adenomatous.  Patient was told to have another colonoscopy in 3 years.   COLONOSCOPY WITH PROPOFOL N/A 10/11/2017   Procedure: COLONOSCOPY WITH PROPOFOL;  Surgeon: Daneil Dolin, MD;  [facility];  Service: Endoscopy;  Laterality: N/A;  1:15pm   IR IMAGING GUIDED PORT INSERTION  06/05/2020   PERIPHERAL VASCULAR INTERVENTION  12/24/2021   Procedure: PERIPHERAL VASCULAR INTERVENTION;  Surgeon: Broadus John, MD;  Location: [facility];  Service: Cardiovascular;;   POLYPECTOMY  10/11/2017   Procedure: POLYPECTOMY;  Surgeon: Daneil Dolin, MD;  [facility];  Service: Endoscopy;;  descending colon polyps cs times 2    SOCIAL HISTORY: Social History   Socioeconomic History   Marital status: Married    Spouse name: Not on  file   Number of children: Not on file   Years of education: Not on file   Highest education level: Not on file  Occupational History   Not on file  Tobacco Use   Smoking status: Former    Packs/day: 1.00    Years: 28.00    Additional pack years: 0.00    Total pack years: 28.00    Types: Cigarettes    Quit date: 10/08/2013    Years since  quitting: 8.7   Smokeless tobacco: Never  Vaping Use   Vaping Use: Never used  Substance and Sexual Activity   Alcohol use: Not Currently   Drug use: Never   Sexual activity: Yes    Birth control/protection: Post-menopausal  Other Topics Concern   Not on file  Social History Narrative   Not on file   Social Determinants of Health   Financial Resource Strain: Low Risk  (06/06/2020)   Overall Financial Resource Strain (CARDIA)    Difficulty of Paying Living Expenses: Not hard at all  Food Insecurity: No Food Insecurity (06/06/2020)   Hunger Vital Sign    Worried About Running Out of Food in the Last Year: Never true    [place] in the Last Year: Never true  Transportation Needs: No Transportation Needs (06/06/2020)   PRAPARE - Hydrologist (Medical): No    Lack of Transportation (Non-Medical): No  Physical Activity: Not on file  Stress: No Stress Concern Present (06/06/2020)   [place]    Feeling of Stress : Only a little  Social Connections: Unknown (06/06/2020)   Social Connection and Isolation Panel [NHANES]    Frequency of Communication with Friends and Family: More than three times a week    Frequency of Social Gatherings with Friends and Family: More than three times a week    Attends Religious Services: Not on Advertising copywriter or Organizations: Not on file    Attends Archivist Meetings: Not on file    Marital Status: Married  Human resources officer Violence: Not on file    FAMILY HISTORY: Family History  Problem Relation Age of Onset   Heart disease Mother    Diabetes Mother    Lung cancer Father    COPD Father    Colon cancer Neg Hx     ALLERGIES:  has No Known Allergies.  MEDICATIONS:  Current Outpatient Medications  Medication Sig Dispense Refill   acetaminophen (TYLENOL) 500 MG tablet Take 1,000 mg by mouth every 6 (six) hours as needed for  fever or headache.     ADVAIR HFA 230-21 MCG/ACT inhaler Inhale 2 puffs into the lungs 2 (two) times daily.     Ascorbic Acid (VITAMIN C) 1000 MG tablet Take 1,000 mg by mouth every morning.     b complex vitamins capsule Take 1 capsule by mouth daily. (Patient taking differently: Take 1 capsule by mouth every morning.) 30 capsule 3   Cholecalciferol (VITAMIN D3) 5000 units CAPS Take 5,000 Units by mouth every morning.     clopidogrel (PLAVIX) 75 MG tablet Take 1 tablet (75 mg total) by mouth daily. 30 tablet 11   Cyanocobalamin (VITAMIN B12) 3000 MCG SUBL Place 3,000 mcg under the tongue every morning.     ELIQUIS 2.5 MG TABS tablet Take 1 tablet by mouth twice daily 60 tablet 0   lisinopril-hydrochlorothiazide (ZESTORETIC) 10-12.5 MG tablet Take 1 tablet  by mouth daily.     loratadine (CLARITIN) 10 MG tablet Take 10 mg by mouth every morning.     omeprazole (PRILOSEC) 40 MG capsule Take 40 mg by mouth daily as needed (acid reflux).     ondansetron (ZOFRAN) 8 MG tablet Take 1 tablet (8 mg total) by mouth 2 (two) times daily as needed (Nausea or vomiting). (Patient not taking: Reported on 04/07/2022) 30 tablet 1   REVLIMID 10 MG capsule TAKE 1 CAPSULE BY MOUTH DAILY FOR 21 DAYS, THEN TAKE 7 DAYS OFF 21 capsule 0   rosuvastatin (CRESTOR) 10 MG tablet Take 10 mg by mouth daily.     Saccharomyces boulardii 500 MG PACK Take 500 mg by mouth 2 (two) times daily at 8 am and 10 pm. 20 each 0   No current facility-administered medications for this visit.    REVIEW OF SYSTEMS:    10 Point review of Systems was done is negative except as noted above.   PHYSICAL EXAMINATION:  ECOG PERFORMANCE STATUS: 1 - Symptomatic but completely ambulatory .BP 119/68 (BP Location: Left Arm, Patient Position: Sitting)   Pulse 71   Temp 98.1 F (36.7 C) (Tympanic)   Resp 18   Ht 5\' 5"  (1.651 m)   Wt 126 lb 8 oz (57.4 kg)   SpO2 100%   BMI 21.05 kg/m  GENERAL:alert, in no acute distress and comfortable SKIN: no  acute rashes, no significant lesions EYES: conjunctiva are pink and non-injected, sclera anicteric OROPHARYNX: MMM, no exudates, no oropharyngeal erythema or ulceration NECK: supple, no JVD LYMPH:  no palpable lymphadenopathy in the cervical, axillary or inguinal regions LUNGS: clear to auscultation b/l with normal respiratory effort HEART: regular rate & rhythm ABDOMEN:  normoactive bowel sounds , non tender, not distended. Extremity: no pedal edema PSYCH: alert & oriented x 3 with fluent speech NEURO: no focal motor/sensory deficits   LABORATORY DATA:  I have reviewed the data as listed  .    Latest Ref Rng & Units 06/24/2022   10:10 AM 06/13/2022    1:06 PM 04/27/2022    8:47 AM  CBC  WBC 4.0 - 10.5 K/uL 5.4  3.3  3.3   Hemoglobin 12.0 - 15.0 g/dL 12.8  13.8  12.7   Hematocrit 36.0 - 46.0 % 37.2  39.5  37.2   Platelets 150 - 400 K/uL 236  129  151     .    Latest Ref Rng & Units 06/24/2022   10:10 AM 06/13/2022    1:06 PM 04/27/2022    8:47 AM  CMP  Glucose 70 - 99 mg/dL 118  127  93   BUN 8 - 23 mg/dL 18  11  21    Creatinine 0.44 - 1.00 mg/dL 0.89  0.96  1.13   Sodium 135 - 145 mmol/L 135  134  136   Potassium 3.5 - 5.1 mmol/L 3.9  3.3  4.1   Chloride 98 - 111 mmol/L 103  96  103   CO2 22 - 32 mmol/L 25  30  26    Calcium 8.9 - 10.3 mg/dL 9.0  8.5  9.3   Total Protein 6.5 - 8.1 g/dL 6.4  6.4  6.3   Total Bilirubin 0.3 - 1.2 mg/dL 0.4  0.9  0.8   Alkaline Phos 38 - 126 U/L 77  59  74   AST 15 - 41 U/L 15  23  20    ALT 0 - 44 U/L 14  23  21  Most recent lab results (03/15/2020) of CBC is as follows: all values are WNL except for RBC at 3.66, Hgb at 9.7, HCT at 31.8, MCHC at 30.5, RDW Standard Deviation 58.7, Red Cell Distribution Width At 18.6,  BUN at 19, Creatinine at 1.06, Total Protein at 10.2, Albumin at 2.7, AST at 14,. 03/13/2020 UPEP shows no M-Spike, all values are WNL 03/06/2020 SPEP shows Total Protein at 10.1, Beta Globulin at 5.2, M-Spike at 4.3, Total  Globulin at 6.4, A/G Ratio at 0.6 03/06/2020 Free Kappa Light Chains at 181.1, Free Lambda Light Chains Serum at 3.6, K/L ratio at 50.31 03/06/2020 Beta-2 Microglobulin at 2.5           RADIOGRAPHIC STUDIES: I have personally reviewed the radiological images as listed and agreed with the findings in the report. DG Chest Port 1 View  Result Date: 06/13/2022 CLINICAL DATA:  Nausea and diarrhea.  Cough. EXAM: PORTABLE CHEST 1 VIEW COMPARISON:  Chest radiograph 06/11/2022 FINDINGS: Port-A-Cath tip projects over the superior vena cava. Monitoring leads overlie the patient. Stable cardiac and mediastinal contours. Aortic atherosclerosis. No large area pulmonary consolidation. No pleural effusion or pneumothorax. IMPRESSION: No active disease. Electronically Signed   By: Lovey Newcomer M.D.   On: 06/13/2022 14:41    ASSESSMENT & PLAN:   62 year old female here for continued follow-up, evaluation and management of multiple myeloma  #1  IgA kappa multiple myeloma status post induction treatment with carfilzomib Revlimid dexamethasone  presenting with anemia hemoglobin 9.4. Creatinine 1.28 No hypercalcemia Bone survey with no obvious skeletal lesions. Baseline M spike of 4.3 g/dL. Beta-2 Microglobulin at 2.5 Mol Cy Monosomy 13 and dup 1q  2) status post stem cell collection assisted with G-CSF and Plerixafor. Currently on maintenance Revlimid. Currently had declined proceeding with consolidative autologous HSCT  3) Diarrhea - ? Related to Revlimid.  Mild and nearly resolved GI panel and c diff neg30  PLAN:  -Discussed lab results on 06/24/2022 with patient in detail. CBC normal, showed WBC of 5.4K, hemoglobin of 12.8, and platelets of 236K. -CMP normal -Myeloma lab-- M spike not observed. -Last myeloma lab 04/27/2022 revealed that patient continued to be in remission -Patient reports no significant toxicities from her current dose of Revlimid at this time.  -Patient has no symptom  suggestive of disease progression  -reccommended patient to stay UTD with her vaccinations, including Shingrix, though patient is disinclined to do so at this time -answered all of patient's questions in detail  FOLLOW-UP: RTC with Dr Irene Limbo with labs in 10 weeks  The total time spent in the appointment was 20 minutes* .  All of the patient's questions were answered with apparent satisfaction. The patient knows to call the clinic with any problems, questions or concerns.   Sullivan Lone MD MS AAHIVMS [facility] Hematology/Oncology Physician [facility]  .*Total Encounter Time as defined by the Centers for Medicare and Medicaid Services includes, in addition to the face-to-face time of a patient visit (documented in the note above) non-face-to-face time: obtaining and reviewing outside history, ordering and reviewing medications, tests or procedures, care coordination (communications with other health care professionals or caregivers) and documentation in the medical record.    I,Mitra Faeizi,acting as a Education administrator for Sullivan Lone, MD.,have documented all relevant documentation on the behalf of Sullivan Lone, MD,as directed by  Sullivan Lone, MD while in the presence of Sullivan Lone, MD.  .I have reviewed the above documentation for accuracy and completeness, and I agree with the above. Marland Kitchen  Brunetta Genera MD

## 2022-06-24 NOTE — Patient Instructions (Signed)

## 2022-06-28 NOTE — Progress Notes (Deleted)
Referring Provider:Blair, Urbano Heir, FNP  Primary Care Physician:  [name], FNP Primary Gastroenterologist:  Dr. [name]  No chief complaint on file.   HPI:   Megan Velasquez is a 62 y.o. female with GI history of adenomatous colon polyps, hemorrhoids, GERD.  Additional medical history includes HTN, PAD s/p stenting of RCIA, multiple myeloma on Revlimid. She is presenting today to discuss scheduling surveillance colonoscopy.  Office visit recommended due to blood thinner.    Last colonoscopy July 2019 two 4-7 mm polyps in the descending colon removed, diverticulosis in the sigmoid and descending colon, nonbleeding internal hemorrhoids.  Pathology with tubular adenomas.  Recommended surveillance in 5 years.  Today:    Past Medical History:  Diagnosis Date   Cancer ([place])    GERD (gastroesophageal reflux disease)    History of kidney stones    HTN (hypertension)    Multiple myeloma ([place])    Renal disorder     Past Surgical History:  Procedure Laterality Date   ABDOMINAL AORTOGRAM W/LOWER EXTREMITY N/A 12/24/2021   Procedure: ABDOMINAL AORTOGRAM W/LOWER EXTREMITY;  Surgeon: Broadus John, MD;  Location: [place] [facility];  Service: Cardiovascular;  Laterality: N/A;   CHOLECYSTECTOMY     COLONOSCOPY  01/2015   Dr. Britta Mccreedy: Mild diverticulosis, sessile polyp ranging 3 to 5 mm removed from the proximal transverse colon, semi-pedunculated polyp 5 to 9 mm in size removed from the sigmoid colon.  Sigmoid colon polyp was serrated adenoma, transverse colon polyp was adenomatous.  Patient was told to have another colonoscopy in 3 years.   COLONOSCOPY WITH PROPOFOL N/A 10/11/2017   Procedure: COLONOSCOPY WITH PROPOFOL;  Surgeon: Daneil Dolin, MD;  [facility];  Service: Endoscopy;  Laterality: N/A;  1:15pm   IR IMAGING GUIDED PORT INSERTION  06/05/2020   PERIPHERAL VASCULAR INTERVENTION  12/24/2021   Procedure: PERIPHERAL VASCULAR INTERVENTION;  Surgeon: Broadus John, MD;   Location: [place] [facility];  Service: Cardiovascular;;   POLYPECTOMY  10/11/2017   Procedure: POLYPECTOMY;  Surgeon: Daneil Dolin, MD;  [facility];  Service: Endoscopy;;  descending colon polyps cs times 2    Current Outpatient Medications  Medication Sig Dispense Refill   acetaminophen (TYLENOL) 500 MG tablet Take 1,000 mg by mouth every 6 (six) hours as needed for fever or headache.     ADVAIR HFA 230-21 MCG/ACT inhaler Inhale 2 puffs into the lungs 2 (two) times daily.     Ascorbic Acid (VITAMIN C) 1000 MG tablet Take 1,000 mg by mouth every morning.     b complex vitamins capsule Take 1 capsule by mouth daily. (Patient taking differently: Take 1 capsule by mouth every morning.) 30 capsule 3   Cholecalciferol (VITAMIN D3) 5000 units CAPS Take 5,000 Units by mouth every morning.     clopidogrel (PLAVIX) 75 MG tablet Take 1 tablet (75 mg total) by mouth daily. 30 tablet 11   Cyanocobalamin (VITAMIN B12) 3000 MCG SUBL Place 3,000 mcg under the tongue every morning.     ELIQUIS 2.5 MG TABS tablet Take 1 tablet by mouth twice daily 60 tablet 0   lisinopril-hydrochlorothiazide (ZESTORETIC) 10-12.5 MG tablet Take 1 tablet by mouth daily.     loratadine (CLARITIN) 10 MG tablet Take 10 mg by mouth every morning.     omeprazole (PRILOSEC) 40 MG capsule Take 40 mg by mouth daily as needed (acid reflux).     ondansetron (ZOFRAN) 8 MG tablet Take 1 tablet (8 mg  total) by mouth 2 (two) times daily as needed (Nausea or vomiting). 30 tablet 1   REVLIMID 10 MG capsule TAKE 1 CAPSULE BY MOUTH DAILY FOR 21 DAYS, THEN TAKE 7 DAYS OFF 21 capsule 0   rosuvastatin (CRESTOR) 10 MG tablet Take 10 mg by mouth daily.     Saccharomyces boulardii 500 MG PACK Take 500 mg by mouth 2 (two) times daily at 8 am and 10 pm. (Patient not taking: Reported on 06/24/2022) 20 each 0   No current facility-administered medications for this visit.    Allergies as of 06/29/2022   (No Known Allergies)    Family  History  Problem Relation Age of Onset   Heart disease Mother    Diabetes Mother    Lung cancer Father    COPD Father    Colon cancer Neg Hx     Social History   Socioeconomic History   Marital status: Married    Spouse name: Not on file   Number of children: Not on file   Years of education: Not on file   Highest education level: Not on file  Occupational History   Not on file  Tobacco Use   Smoking status: Former    Packs/day: 1.00    Years: 28.00    Additional pack years: 0.00    Total pack years: 28.00    Types: Cigarettes    Quit date: 10/08/2013    Years since quitting: 8.7   Smokeless tobacco: Never  Vaping Use   Vaping Use: Never used  Substance and Sexual Activity   Alcohol use: Not Currently   Drug use: Never   Sexual activity: Yes    Birth control/protection: Post-menopausal  Other Topics Concern   Not on file  Social History Narrative   Not on file   Social Determinants of Health   Financial Resource Strain: Low Risk  (06/06/2020)   Overall Financial Resource Strain (CARDIA)    Difficulty of Paying Living Expenses: Not hard at all  Food Insecurity: No Food Insecurity (06/06/2020)   Hunger Vital Sign    Worried About Running Out of Food in the Last Year: Never true    [place] in the Last Year: Never true  Transportation Needs: No Transportation Needs (06/06/2020)   PRAPARE - Hydrologist (Medical): No    Lack of Transportation (Non-Medical): No  Physical Activity: Not on file  Stress: No Stress Concern Present (06/06/2020)   [place]    Feeling of Stress : Only a little  Social Connections: Unknown (06/06/2020)   Social Connection and Isolation Panel [NHANES]    Frequency of Communication with Friends and Family: More than three times a week    Frequency of Social Gatherings with Friends and Family: More than three times a week    Attends Religious  Services: Not on Advertising copywriter or Organizations: Not on file    Attends Archivist Meetings: Not on file    Marital Status: Married  Human resources officer Violence: Not on file    Review of Systems: Gen: Denies any fever, chills, fatigue, weight loss, lack of appetite.  CV: Denies chest pain, heart palpitations, peripheral edema, syncope.  Resp: Denies shortness of breath at rest or with exertion. Denies wheezing or cough.  GI: Denies dysphagia or odynophagia. Denies jaundice, hematemesis, fecal incontinence. GU : Denies urinary burning, urinary frequency, urinary hesitancy MS:  Denies joint pain, muscle weakness, cramps, or limitation of movement.  Derm: Denies rash, itching, dry skin Psych: Denies depression, anxiety, memory loss, and confusion Heme: Denies bruising, bleeding, and enlarged lymph nodes.  Physical Exam: There were no vitals taken for this visit. General:   Alert and oriented. Pleasant and cooperative. Well-nourished and well-developed.  Head:  Normocephalic and atraumatic. Eyes:  Without icterus, sclera clear and conjunctiva pink.  Ears:  Normal auditory acuity. Lungs:  Clear to auscultation bilaterally. No wheezes, rales, or rhonchi. No distress.  Heart:  S1, S2 present without murmurs appreciated.  Abdomen:  +BS, soft, non-tender and non-distended. No HSM noted. No guarding or rebound. No masses appreciated.  Rectal:  Deferred  Msk:  Symmetrical without gross deformities. Normal posture. Extremities:  Without edema. Neurologic:  Alert and  oriented x4;  grossly normal neurologically. Skin:  Intact without significant lesions or rashes. Psych:  Alert and cooperative. Normal mood and affect.    Assessment:     Plan:  ***   Aliene Altes, PA-C [facility] 06/29/2022

## 2022-06-29 ENCOUNTER — Ambulatory Visit: Payer: BC Managed Care – PPO | Admitting: Gastroenterology

## 2022-06-29 ENCOUNTER — Encounter: Payer: Self-pay | Admitting: Gastroenterology

## 2022-06-30 ENCOUNTER — Encounter: Payer: Self-pay | Admitting: Hematology

## 2022-06-30 LAB — MULTIPLE MYELOMA PANEL, SERUM
Albumin SerPl Elph-Mcnc: 3.6 g/dL (ref 2.9–4.4)
Albumin/Glob SerPl: 1.5 (ref 0.7–1.7)
Alpha 1: 0.2 g/dL (ref 0.0–0.4)
Alpha2 Glob SerPl Elph-Mcnc: 0.8 g/dL (ref 0.4–1.0)
B-Globulin SerPl Elph-Mcnc: 0.8 g/dL (ref 0.7–1.3)
Gamma Glob SerPl Elph-Mcnc: 0.7 g/dL (ref 0.4–1.8)
Globulin, Total: 2.5 g/dL (ref 2.2–3.9)
IgA: 128 mg/dL (ref 87–352)
IgG (Immunoglobin G), Serum: 782 mg/dL (ref 586–1602)
IgM (Immunoglobulin M), Srm: 17 mg/dL — ABNORMAL LOW (ref 26–217)
Total Protein ELP: 6.1 g/dL (ref 6.0–8.5)

## 2022-07-10 ENCOUNTER — Other Ambulatory Visit: Payer: Self-pay | Admitting: Hematology

## 2022-07-10 DIAGNOSIS — C9000 Multiple myeloma not having achieved remission: Secondary | ICD-10-CM

## 2022-07-12 ENCOUNTER — Other Ambulatory Visit: Payer: Self-pay | Admitting: Oncology

## 2022-07-13 ENCOUNTER — Encounter: Payer: Self-pay | Admitting: Hematology

## 2022-07-13 ENCOUNTER — Other Ambulatory Visit: Payer: Self-pay

## 2022-07-15 ENCOUNTER — Encounter: Payer: Self-pay | Admitting: Hematology

## 2022-08-04 ENCOUNTER — Other Ambulatory Visit: Payer: Self-pay | Admitting: Hematology

## 2022-08-04 DIAGNOSIS — C9000 Multiple myeloma not having achieved remission: Secondary | ICD-10-CM

## 2022-08-10 ENCOUNTER — Other Ambulatory Visit: Payer: Self-pay

## 2022-08-10 ENCOUNTER — Encounter: Payer: Self-pay | Admitting: Hematology

## 2022-08-12 ENCOUNTER — Telehealth: Payer: Self-pay | Admitting: Pharmacy Technician

## 2022-08-12 ENCOUNTER — Other Ambulatory Visit (HOSPITAL_COMMUNITY): Payer: Self-pay

## 2022-08-12 NOTE — Telephone Encounter (Signed)
Oral Oncology Patient Advocate Encounter   Received notification that prior authorization for Revlimid is due for renewal.   PA submitted on 08/12/22 Submitted via e-fax to [employer] [phone] Status is pending     Jinger Neighbors, CPhT-Adv Oncology Pharmacy Patient Advocate [facility] Direct Number: [phone]  Fax: [phone]

## 2022-08-13 ENCOUNTER — Other Ambulatory Visit (HOSPITAL_COMMUNITY): Payer: Self-pay

## 2022-08-13 NOTE — Telephone Encounter (Signed)
Oral Oncology Patient Advocate Encounter  Prior Authorization for lenalidomide has been approved.    PA# [id number] Effective dates: 08/12/22 through 08/12/23  Patien must continue to fill at [facility].    Jinger Neighbors, CPhT-Adv Oncology Pharmacy Patient Advocate [facility] Direct Number: [phone]  Fax: [phone]

## 2022-08-17 ENCOUNTER — Other Ambulatory Visit: Payer: Self-pay | Admitting: Oncology

## 2022-08-27 ENCOUNTER — Encounter: Payer: Self-pay | Admitting: *Deleted

## 2022-08-28 ENCOUNTER — Other Ambulatory Visit: Payer: Self-pay

## 2022-08-28 DIAGNOSIS — C9001 Multiple myeloma in remission: Secondary | ICD-10-CM

## 2022-09-01 ENCOUNTER — Encounter: Payer: Self-pay | Admitting: Hematology

## 2022-09-02 ENCOUNTER — Other Ambulatory Visit: Payer: Self-pay | Admitting: Hematology

## 2022-09-02 ENCOUNTER — Inpatient Hospital Stay: Payer: BC Managed Care – PPO | Attending: Hematology

## 2022-09-02 ENCOUNTER — Inpatient Hospital Stay: Payer: BC Managed Care – PPO | Admitting: Hematology

## 2022-09-02 VITALS — BP 128/70 | HR 69 | Temp 98.3°F | Resp 18 | Ht 65.0 in | Wt 130.1 lb

## 2022-09-02 DIAGNOSIS — C9001 Multiple myeloma in remission: Secondary | ICD-10-CM

## 2022-09-02 DIAGNOSIS — C9000 Multiple myeloma not having achieved remission: Secondary | ICD-10-CM | POA: Diagnosis present

## 2022-09-02 DIAGNOSIS — Z87891 Personal history of nicotine dependence: Secondary | ICD-10-CM | POA: Diagnosis not present

## 2022-09-02 DIAGNOSIS — Z95828 Presence of other vascular implants and grafts: Secondary | ICD-10-CM

## 2022-09-02 LAB — CMP (CANCER CENTER ONLY)
ALT: 17 U/L (ref 0–44)
AST: 17 U/L (ref 15–41)
Albumin: 4.1 g/dL (ref 3.5–5.0)
Alkaline Phosphatase: 73 U/L (ref 38–126)
Anion gap: 7 (ref 5–15)
BUN: 24 mg/dL — ABNORMAL HIGH (ref 8–23)
CO2: 28 mmol/L (ref 22–32)
Calcium: 8.8 mg/dL — ABNORMAL LOW (ref 8.9–10.3)
Chloride: 102 mmol/L (ref 98–111)
Creatinine: 1.05 mg/dL — ABNORMAL HIGH (ref 0.44–1.00)
GFR, Estimated: >60 mL/min (ref >60)
Glucose, Bld: 97 mg/dL (ref 70–99)
Potassium: 4.3 mmol/L (ref 3.5–5.1)
Sodium: 137 mmol/L (ref 135–145)
Total Bilirubin: 0.5 mg/dL (ref 0.3–1.2)
Total Protein: 6.6 g/dL (ref 6.5–8.1)

## 2022-09-02 LAB — CBC WITH DIFFERENTIAL (CANCER CENTER ONLY)
Abs Immature Granulocytes: 0.02 10*3/uL (ref 0.00–0.07)
Basophils Absolute: 0 10*3/uL (ref 0.0–0.1)
Basophils Relative: 0 %
Eosinophils Absolute: 0.2 10*3/uL (ref 0.0–0.5)
Eosinophils Relative: 3 %
HCT: 36.2 % (ref 36.0–46.0)
Hemoglobin: 12.4 g/dL (ref 12.0–15.0)
Immature Granulocytes: 0 %
Lymphocytes Relative: 24 %
Lymphs Abs: 1.1 10*3/uL (ref 0.7–4.0)
MCH: 32.1 pg (ref 26.0–34.0)
MCHC: 34.3 g/dL (ref 30.0–36.0)
MCV: 93.8 fL (ref 80.0–100.0)
Monocytes Absolute: 0.6 10*3/uL (ref 0.1–1.0)
Monocytes Relative: 14 %
Neutro Abs: 2.8 10*3/uL (ref 1.7–7.7)
Neutrophils Relative %: 59 %
Platelet Count: 168 10*3/uL (ref 150–400)
RBC: 3.86 MIL/uL — ABNORMAL LOW (ref 3.87–5.11)
RDW: 13.9 % (ref 11.5–15.5)
WBC Count: 4.8 10*3/uL (ref 4.0–10.5)
nRBC: 0 % (ref 0.0–0.2)

## 2022-09-02 MED ORDER — SODIUM CHLORIDE 0.9% FLUSH
10.0000 mL | Freq: Once | INTRAVENOUS | Status: AC
  Administered 2022-09-02: 10 mL

## 2022-09-02 MED ORDER — HEPARIN SOD (PORK) LOCK FLUSH 100 UNIT/ML IV SOLN
500.0000 [IU] | Freq: Once | INTRAVENOUS | Status: AC
  Administered 2022-09-02: 500 [IU]

## 2022-09-02 NOTE — Progress Notes (Signed)
HEMATOLOGY/ONCOLOGY CLINIC NOTE  Date of Service: 09/02/2022  Patient Care Team: Delorse Lek, FNP as PCP - General (Family Medicine) Wyline Mood, Dorothe Pea, MD as PCP - Cardiology (Cardiology) Delorse Lek, FNP as PCP - Family Medicine (Family Medicine) Jena Gauss, Gerrit Friends, MD as Consulting Physician (Gastroenterology)  CHIEF COMPLAINTS/PURPOSE OF CONSULTATION:  Continued follow-up for evaluation and management of multiple myeloma  HISTORY OF PRESENTING ILLNESS:  Please see previous note for details on initial presentation  INTERVAL HISTORY  Megan Velasquez is a 62 y.o. female is here for continued evaluation and management of her multiple myeloma. Patient was last seen by me on 06/24/2022 and reported a respiratory infection, a 4-pound weight-loss, and severe diarrhea caused by Z-Pak. She also reported weakness, fatigue, right forehead soreness, and dry cough.  Today, she is accompanied by her husband. She reports a mildly painful infection in her inguinal area with redness around the area and spontaneous drain. She does take antibiotics but is unsure of the type. She believes that it may be Cephalexin.  Patient had cataract surgery a while ago. She reports that her left eye has collected condensation under the lens causing vision issues. Patient was seen by ophthalmologist yesterday and was consulted on condensation removal.  She denies any new bone pain or back pain, and endorses normal eating and sleeping habits. She regularly drinks at least 3 16.9 oz bottles daily.  MEDICAL HISTORY:  Past Medical History:  Diagnosis Date   Cancer (HCC)    GERD (gastroesophageal reflux disease)    History of kidney stones    HTN (hypertension)    Multiple myeloma (HCC)    Renal disorder     SURGICAL HISTORY: Past Surgical History:  Procedure Laterality Date   ABDOMINAL AORTOGRAM W/LOWER EXTREMITY N/A 12/24/2021   Procedure: ABDOMINAL AORTOGRAM W/LOWER EXTREMITY;  Surgeon: Victorino Sparrow,  MD;  Location: [facility] INVASIVE CV LAB;  Service: Cardiovascular;  Laterality: N/A;   CHOLECYSTECTOMY     COLONOSCOPY  01/2015   Dr. Teena Dunk: Mild diverticulosis, sessile polyp ranging 3 to 5 mm removed from the proximal transverse colon, semi-pedunculated polyp 5 to 9 mm in size removed from the sigmoid colon.  Sigmoid colon polyp was serrated adenoma, transverse colon polyp was adenomatous.  Patient was told to have another colonoscopy in 3 years.   COLONOSCOPY WITH PROPOFOL N/A 10/11/2017   Procedure: COLONOSCOPY WITH PROPOFOL;  Surgeon: Corbin Ade, MD;  Location: [facility];  Service: Endoscopy;  Laterality: N/A;  1:15pm   IR IMAGING GUIDED PORT INSERTION  06/05/2020   PERIPHERAL VASCULAR INTERVENTION  12/24/2021   Procedure: PERIPHERAL VASCULAR INTERVENTION;  Surgeon: Victorino Sparrow, MD;  Location: [facility] INVASIVE CV LAB;  Service: Cardiovascular;;   POLYPECTOMY  10/11/2017   Procedure: POLYPECTOMY;  Surgeon: Corbin Ade, MD;  Location: [facility];  Service: Endoscopy;;  descending colon polyps cs times 2    SOCIAL HISTORY: Social History   Socioeconomic History   Marital status: Married    Spouse name: Not on file   Number of children: Not on file   Years of education: Not on file   Highest education level: Not on file  Occupational History   Not on file  Tobacco Use   Smoking status: Former    Packs/day: 1.00    Years: 28.00    Additional pack years: 0.00    Total pack years: 28.00    Types: Cigarettes    Quit date: 10/08/2013    Years  since quitting: 8.9   Smokeless tobacco: Never  Vaping Use   Vaping Use: Never used  Substance and Sexual Activity   Alcohol use: Not Currently   Drug use: Never   Sexual activity: Yes    Birth control/protection: Post-menopausal  Other Topics Concern   Not on file  Social History Narrative   Not on file   Social Determinants of Health   Financial Resource Strain: Low Risk  (06/06/2020)   Overall Financial Resource Strain (CARDIA)     Difficulty of Paying Living Expenses: Not hard at all  Food Insecurity: No Food Insecurity (06/06/2020)   Hunger Vital Sign    Worried About Running Out of Food in the Last Year: Never true    Ran Out of Food in the Last Year: Never true  Transportation Needs: No Transportation Needs (06/06/2020)   PRAPARE - Administrator, Civil Service (Medical): No    Lack of Transportation (Non-Medical): No  Physical Activity: Not on file  Stress: No Stress Concern Present (06/06/2020)   [employer] of Occupational Health - Occupational Stress Questionnaire    Feeling of Stress : Only a little  Social Connections: Unknown (06/06/2020)   Social Connection and Isolation Panel [NHANES]    Frequency of Communication with Friends and Family: More than three times a week    Frequency of Social Gatherings with Friends and Family: More than three times a week    Attends Religious Services: Not on Marketing executive or Organizations: Not on file    Attends Banker Meetings: Not on file    Marital Status: Married  Catering manager Violence: Not on file    FAMILY HISTORY: Family History  Problem Relation Age of Onset   Heart disease Mother    Diabetes Mother    Lung cancer Father    COPD Father    Colon cancer Neg Hx     ALLERGIES:  has No Known Allergies.  MEDICATIONS:  Current Outpatient Medications  Medication Sig Dispense Refill   REVLIMID 10 MG capsule TAKE 1 CAPSULE BY MOUTH DAILY FOR 21 DAYS, THEN TAKE 7 DAYS OFF 21 capsule 10   acetaminophen (TYLENOL) 500 MG tablet Take 1,000 mg by mouth every 6 (six) hours as needed for fever or headache.     ADVAIR HFA 230-21 MCG/ACT inhaler Inhale 2 puffs into the lungs 2 (two) times daily.     Ascorbic Acid (VITAMIN C) 1000 MG tablet Take 1,000 mg by mouth every morning.     b complex vitamins capsule Take 1 capsule by mouth daily. (Patient taking differently: Take 1 capsule by mouth every morning.) 30 capsule 3    Cholecalciferol (VITAMIN D3) 5000 units CAPS Take 5,000 Units by mouth every morning.     clopidogrel (PLAVIX) 75 MG tablet Take 1 tablet (75 mg total) by mouth daily. 30 tablet 11   Cyanocobalamin (VITAMIN B12) 3000 MCG SUBL Place 3,000 mcg under the tongue every morning.     ELIQUIS 2.5 MG TABS tablet Take 1 tablet by mouth twice daily 60 tablet 0   lisinopril-hydrochlorothiazide (ZESTORETIC) 10-12.5 MG tablet Take 1 tablet by mouth daily.     loratadine (CLARITIN) 10 MG tablet Take 10 mg by mouth every morning.     omeprazole (PRILOSEC) 40 MG capsule Take 40 mg by mouth daily as needed (acid reflux).     ondansetron (ZOFRAN) 8 MG tablet Take 1 tablet (8 mg total) by mouth 2 (two)  times daily as needed (Nausea or vomiting). 30 tablet 1   rosuvastatin (CRESTOR) 10 MG tablet Take 10 mg by mouth daily.     Saccharomyces boulardii 500 MG PACK Take 500 mg by mouth 2 (two) times daily at 8 am and 10 pm. (Patient not taking: Reported on 06/24/2022) 20 each 0   No current facility-administered medications for this visit.    REVIEW OF SYSTEMS:    10 Point review of Systems was done is negative except as noted above.   PHYSICAL EXAMINATION:  ECOG PERFORMANCE STATUS: 1 - Symptomatic but completely ambulatory .BP 128/70 (BP Location: Right Arm, Patient Position: Sitting)   Pulse 69   Temp 98.3 F (36.8 C) (Oral)   Resp 18   Ht 5\' 5"  (1.651 m)   Wt 130 lb 1.6 oz (59 kg)   SpO2 100%   BMI 21.65 kg/m   GENERAL:alert, in no acute distress and comfortable SKIN: no acute rashes, no significant lesions EYES: conjunctiva are pink and non-injected, sclera anicteric OROPHARYNX: MMM, no exudates, no oropharyngeal erythema or ulceration NECK: supple, no JVD LYMPH:  no palpable lymphadenopathy in the cervical, axillary or inguinal regions LUNGS: clear to auscultation b/l with normal respiratory effort HEART: regular rate & rhythm ABDOMEN:  normoactive bowel sounds , non tender, not  distended. Extremity: no pedal edema PSYCH: alert & oriented x 3 with fluent speech NEURO: no focal motor/sensory deficits   LABORATORY DATA:  I have reviewed the data as listed  .    Latest Ref Rng & Units 09/02/2022    9:55 AM 06/24/2022   10:10 AM 06/13/2022    1:06 PM  CBC  WBC 4.0 - 10.5 K/uL 4.8  5.4  3.3   Hemoglobin 12.0 - 15.0 g/dL 16.1  09.6  04.5   Hematocrit 36.0 - 46.0 % 36.2  37.2  39.5   Platelets 150 - 400 K/uL 168  236  129     .    Latest Ref Rng & Units 09/02/2022    9:55 AM 06/24/2022   10:10 AM 06/13/2022    1:06 PM  CMP  Glucose 70 - 99 mg/dL 97  409  811   BUN 8 - 23 mg/dL 24  18  11    Creatinine 0.44 - 1.00 mg/dL 9.14  7.82  9.56   Sodium 135 - 145 mmol/L 137  135  134   Potassium 3.5 - 5.1 mmol/L 4.3  3.9  3.3   Chloride 98 - 111 mmol/L 102  103  96   CO2 22 - 32 mmol/L 28  25  30    Calcium 8.9 - 10.3 mg/dL 8.8  9.0  8.5   Total Protein 6.5 - 8.1 g/dL 6.6  6.4  6.4   Total Bilirubin 0.3 - 1.2 mg/dL 0.5  0.4  0.9   Alkaline Phos 38 - 126 U/L 73  77  59   AST 15 - 41 U/L 17  15  23    ALT 0 - 44 U/L 17  14  23       Most recent lab results (03/15/2020) of CBC is as follows: all values are WNL except for RBC at 3.66, Hgb at 9.7, HCT at 31.8, MCHC at 30.5, RDW Standard Deviation 58.7, Red Cell Distribution Width At 18.6,  BUN at 19, Creatinine at 1.06, Total Protein at 10.2, Albumin at 2.7, AST at 14,. 03/13/2020 UPEP shows no M-Spike, all values are WNL 03/06/2020 SPEP shows Total Protein at 10.1, Beta Globulin at  5.2, M-Spike at 4.3, Total Globulin at 6.4, A/G Ratio at 0.6 03/06/2020 Free Kappa Light Chains at 181.1, Free Lambda Light Chains Serum at 3.6, K/L ratio at 50.31 03/06/2020 Beta-2 Microglobulin at 2.5           RADIOGRAPHIC STUDIES: I have personally reviewed the radiological images as listed and agreed with the findings in the report. No results found.  ASSESSMENT & PLAN:   62 year old female here for continued follow-up,  evaluation and management of multiple myeloma  #1  IgA kappa multiple myeloma status post induction treatment with carfilzomib Revlimid dexamethasone  presenting with anemia hemoglobin 9.4. Creatinine 1.28 No hypercalcemia Bone survey with no obvious skeletal lesions. Baseline M spike of 4.3 g/dL. Beta-2 Microglobulin at 2.5 Mol Cy Monosomy 13 and dup 1q  2) status post stem cell collection assisted with G-CSF and Plerixafor. Currently on maintenance Revlimid. Currently had declined proceeding with consolidative autologous HSCT  3) Diarrhea - ? Related to Revlimid.  Mild and nearly resolved GI panel and c diff neg30  PLAN:  -Discussed lab results on 09/02/2022 in detail with patient. CBC stable, showed WBC of 4.8K, hemoglobin of 12.4, and platelets of 168K. -CMP show mild dehydration, otherwise normal -informed patient that dehydration -last myeloma lab on 06/24/2022 showed no M protein and pt continued to be in remission -continue maintence revlimid -discussed options of antibiotics and warm compresses to improve symptoms -would recommend to hold Revlimid for 2-4 weeks depending on severity following any surgery -With patient's history of myeloma, discussed need to be more cautious with infections -If infection is deeper, patient may need IV antibiotics to ensure there is enough tissue infiltration of antibiotics -answered all of patient's and her husband's questions in detail -Last abnormal protein visible was  09/12/2020 -patient will be 2 years into remission on November, 2024 -Patient reports no significant toxicities from her current dose of Revlimid at this time.  -Patient has no symptom suggestive of disease progression  -patient is planning on being seen by ophthalmologist for removal of condensation from left lens -recommend OTC triple topical antibiotic ointment along with oral antibiotics to improve infection -recommend warm compresses, avoid shaving area, and wearing  loose clothing to improve groin infection -Patient has an appt with dermotologist on Tuesday -do not believe patient endorses a yeast infection  FOLLOW-UP: RTC with Dr Candise Che with labs in 10 weeks  The total time spent in the appointment was 21 minutes* .  All of the patient's questions were answered with apparent satisfaction. The patient knows to call the clinic with any problems, questions or concerns.   Wyvonnia Lora MD MS AAHIVMS [facility] Hematology/Oncology Physician [facility]  .*Total Encounter Time as defined by the Centers for Medicare and Medicaid Services includes, in addition to the face-to-face time of a patient visit (documented in the note above) non-face-to-face time: obtaining and reviewing outside history, ordering and reviewing medications, tests or procedures, care coordination (communications with other health care professionals or caregivers) and documentation in the medical record.    I,Mitra Faeizi,acting as a Neurosurgeon for Wyvonnia Lora, MD.,have documented all relevant documentation on the behalf of Wyvonnia Lora, MD,as directed by  Wyvonnia Lora, MD while in the presence of Wyvonnia Lora, MD.  .I have reviewed the above documentation for accuracy and completeness, and I agree with the above. Johney Maine MD

## 2022-09-04 ENCOUNTER — Encounter: Payer: Self-pay | Admitting: Hematology

## 2022-09-04 ENCOUNTER — Other Ambulatory Visit: Payer: Self-pay

## 2022-09-09 ENCOUNTER — Encounter: Payer: Self-pay | Admitting: Hematology

## 2022-09-09 NOTE — Progress Notes (Signed)
Returned call to pt regarding taking Revlimid. Pt had a cyst removed in her groin today and the opening was packed with gauze by the Provider. Pt was asking if she should hold her Revlimid as she has 2 days left in her 21 day cycle. Per MD pt to hold last 2 pills and then take her normal 7 days off. Pt will restart Revlimid with next cycle if no s/s of infection. Pt acknowledged information and verbalized understanding.

## 2022-09-10 LAB — MULTIPLE MYELOMA PANEL, SERUM
Albumin SerPl Elph-Mcnc: 3.6 g/dL (ref 2.9–4.4)
Albumin/Glob SerPl: 1.6 (ref 0.7–1.7)
Alpha 1: 0.2 g/dL (ref 0.0–0.4)
Alpha2 Glob SerPl Elph-Mcnc: 0.8 g/dL (ref 0.4–1.0)
B-Globulin SerPl Elph-Mcnc: 0.8 g/dL (ref 0.7–1.3)
Gamma Glob SerPl Elph-Mcnc: 0.6 g/dL (ref 0.4–1.8)
Globulin, Total: 2.4 g/dL (ref 2.2–3.9)
IgA: 128 mg/dL (ref 87–352)
IgG (Immunoglobin G), Serum: 716 mg/dL (ref 586–1602)
IgM (Immunoglobulin M), Srm: 22 mg/dL — ABNORMAL LOW (ref 26–217)
Total Protein ELP: 6 g/dL (ref 6.0–8.5)

## 2022-09-15 ENCOUNTER — Other Ambulatory Visit: Payer: Self-pay

## 2022-09-15 ENCOUNTER — Other Ambulatory Visit (HOSPITAL_COMMUNITY): Payer: Self-pay

## 2022-09-15 DIAGNOSIS — C9000 Multiple myeloma not having achieved remission: Secondary | ICD-10-CM

## 2022-09-20 ENCOUNTER — Other Ambulatory Visit: Payer: Self-pay | Admitting: Oncology

## 2022-09-20 DIAGNOSIS — C9001 Multiple myeloma in remission: Secondary | ICD-10-CM

## 2022-09-25 ENCOUNTER — Other Ambulatory Visit: Payer: Self-pay

## 2022-09-25 ENCOUNTER — Encounter: Payer: Self-pay | Admitting: Hematology

## 2022-09-28 ENCOUNTER — Telehealth: Payer: Self-pay | Admitting: Hematology

## 2022-10-02 ENCOUNTER — Other Ambulatory Visit: Payer: Self-pay | Admitting: Hematology

## 2022-10-02 DIAGNOSIS — C9000 Multiple myeloma not having achieved remission: Secondary | ICD-10-CM

## 2022-10-05 ENCOUNTER — Other Ambulatory Visit: Payer: Self-pay

## 2022-10-05 DIAGNOSIS — Z7189 Other specified counseling: Secondary | ICD-10-CM

## 2022-10-05 DIAGNOSIS — C9000 Multiple myeloma not having achieved remission: Secondary | ICD-10-CM

## 2022-10-05 MED ORDER — ONDANSETRON HCL 8 MG PO TABS
8.0000 mg | ORAL_TABLET | Freq: Two times a day (BID) | ORAL | 1 refills | Status: AC | PRN
Start: 2022-10-05 — End: ?

## 2022-10-06 ENCOUNTER — Other Ambulatory Visit: Payer: Self-pay

## 2022-10-06 ENCOUNTER — Encounter: Payer: Self-pay | Admitting: Hematology

## 2022-10-06 ENCOUNTER — Other Ambulatory Visit: Payer: Self-pay | Admitting: *Deleted

## 2022-10-06 DIAGNOSIS — I739 Peripheral vascular disease, unspecified: Secondary | ICD-10-CM

## 2022-10-06 DIAGNOSIS — M79604 Pain in right leg: Secondary | ICD-10-CM

## 2022-10-06 DIAGNOSIS — L819 Disorder of pigmentation, unspecified: Secondary | ICD-10-CM

## 2022-10-16 ENCOUNTER — Encounter: Payer: Self-pay | Admitting: Hematology

## 2022-10-22 ENCOUNTER — Encounter: Payer: Self-pay | Admitting: Gastroenterology

## 2022-10-22 NOTE — Progress Notes (Signed)
Primary Care Physician:  Delorse Lek, FNP Primary Gastroenterologist:  Dr. Jena Gauss  Chief Complaint  Patient presents with   Colonoscopy    Colonoscopy screening. Was having diarrhea     HPI:   Megan Velasquez is a 62 y.o. female presenting today to discuss scheduling surveillance colonoscopy.  Last colonoscopy 10/11/2017:  - Two 4 to 7 mm polyps in the descending colon, removed with a cold snare. Resected and retrieved.  - Diverticulosis in the sigmoid colon and in the descending colon.  - Non- bleeding internal hemorrhoids.  - The examination was otherwise normal on direct and retroflexion views. -Pathology with tubular adenomas. -Recommended 5-year surveillance.   Today she reports she has had intermittent diarrhea for more than 1 year. She can go a couple of weeks with normal Bms, then diarrhea will start out of no where, and she will have 4-6 watery stools for 3-4 days. She does take imodium during this time. Associated lower abdominal cramping prior to a BM that improves thereafter. No brbpr, melena, unintentional weight loss, medication changes. For the last 1.5 weeks, she has had diarrhea persistently, but seems to be improving over the last 1-2 days. This morning stool was more formed. Denies nocturnal stools. Has been having 3-4 Bms in the morning. She has taken 2 imodium in the last week. Tends to take it if she gets to 4 Bms. She has started Activia yogurt and feels this is helping. Notably, she was on about 17 days of antibiotics about 1 month ago for a cyst on her leg.   She also rells me she has been having morning nausea for last month or so. Eventually it will wear off during the day. No vomiting. Rare heartburn on omeprazole 40 mg daily. No dysphagia. Doesn't eat within 3 hours of going to bed. No new medications. No postprandial symptoms. She has used Zofran a couple of times. This is prescribed by her oncologist. No early satiety. Eating well. No weight loss. She does  notice some hoarseness in the morning.   Reports she is on Eliquis due to her risk of blood clots secondary to her cancer. Prescribed by oncology.   Past Medical History:  Diagnosis Date   Cancer (HCC)    GERD (gastroesophageal reflux disease)    History of kidney stones    HTN (hypertension)    Multiple myeloma (HCC)    PAD (peripheral artery disease) (HCC)    Renal disorder     Past Surgical History:  Procedure Laterality Date   ABDOMINAL AORTOGRAM W/LOWER EXTREMITY N/A 12/24/2021   Procedure: ABDOMINAL AORTOGRAM W/LOWER EXTREMITY;  Surgeon: Victorino Sparrow, MD;  Location: [facility] INVASIVE CV LAB;  Service: Cardiovascular;  Laterality: N/A;   CHOLECYSTECTOMY     COLONOSCOPY  01/2015   Dr. Teena Dunk: Mild diverticulosis, sessile polyp ranging 3 to 5 mm removed from the proximal transverse colon, semi-pedunculated polyp 5 to 9 mm in size removed from the sigmoid colon.  Sigmoid colon polyp was serrated adenoma, transverse colon polyp was adenomatous.  Patient was told to have another colonoscopy in 3 years.   COLONOSCOPY WITH PROPOFOL N/A 10/11/2017   Procedure: COLONOSCOPY WITH PROPOFOL;  Surgeon: Corbin Ade, MD;  Location: [facility];  Service: Endoscopy;  Laterality: N/A;  1:15pm   IR IMAGING GUIDED PORT INSERTION  06/05/2020   PERIPHERAL VASCULAR INTERVENTION  12/24/2021   Procedure: PERIPHERAL VASCULAR INTERVENTION;  Surgeon: Victorino Sparrow, MD;  Location: [facility] INVASIVE CV LAB;  Service: Cardiovascular;;  POLYPECTOMY  10/11/2017   Procedure: POLYPECTOMY;  Surgeon: Corbin Ade, MD;  Location: [facility];  Service: Endoscopy;;  descending colon polyps cs times 2    Current Outpatient Medications  Medication Sig Dispense Refill   acetaminophen (TYLENOL) 500 MG tablet Take 1,000 mg by mouth every 6 (six) hours as needed for fever or headache.     apixaban (ELIQUIS) 2.5 MG TABS tablet Take 1 tablet by mouth twice daily 60 tablet 2   Ascorbic Acid (VITAMIN C) 1000 MG tablet Take  1,000 mg by mouth every morning.     b complex vitamins capsule Take 1 capsule by mouth daily. (Patient taking differently: Take 1 capsule by mouth every morning.) 30 capsule 3   Cholecalciferol (VITAMIN D3) 5000 units CAPS Take 5,000 Units by mouth every morning.     clopidogrel (PLAVIX) 75 MG tablet Take 1 tablet (75 mg total) by mouth daily. 30 tablet 11   Cyanocobalamin (VITAMIN B12) 3000 MCG SUBL Place 3,000 mcg under the tongue every morning.     famotidine (PEPCID) 20 MG tablet Take 1 tablet (20 mg total) by mouth at bedtime. 30 tablet 3   lisinopril-hydrochlorothiazide (ZESTORETIC) 10-12.5 MG tablet Take 1 tablet by mouth daily.     loratadine (CLARITIN) 10 MG tablet Take 10 mg by mouth every morning.     omeprazole (PRILOSEC) 40 MG capsule Take 40 mg by mouth daily as needed (acid reflux).     ondansetron (ZOFRAN) 8 MG tablet Take 1 tablet (8 mg total) by mouth 2 (two) times daily as needed (Nausea or vomiting). 30 tablet 1   REVLIMID 10 MG capsule TAKE 1 CAPSULE BY MOUTH DAILY FOR 21 DAYS, THEN TAKE 7 DAYS OFF 21 capsule 0   rosuvastatin (CRESTOR) 10 MG tablet Take 10 mg by mouth daily.     ADVAIR HFA 230-21 MCG/ACT inhaler Inhale 2 puffs into the lungs 2 (two) times daily. (Patient not taking: Reported on 10/23/2022)     No current facility-administered medications for this visit.    Allergies as of 10/23/2022   (No Known Allergies)    Family History  Problem Relation Age of Onset   Heart disease Mother    Diabetes Mother    Lung cancer Father    COPD Father    Colon cancer Neg Hx     Social History   Socioeconomic History   Marital status: Married    Spouse name: Not on file   Number of children: Not on file   Years of education: Not on file   Highest education level: Not on file  Occupational History   Not on file  Tobacco Use   Smoking status: Former    Current packs/day: 0.00    Average packs/day: 1 pack/day for 28.0 years (28.0 ttl pk-yrs)    Types:  Cigarettes    Start date: 10/08/1985    Quit date: 10/08/2013    Years since quitting: 9.0   Smokeless tobacco: Never  Vaping Use   Vaping status: Never Used  Substance and Sexual Activity   Alcohol use: Not Currently   Drug use: Never   Sexual activity: Yes    Birth control/protection: Post-menopausal  Other Topics Concern   Not on file  Social History Narrative   Not on file   Social Determinants of Health   Financial Resource Strain: Low Risk  (09/13/2020)   Received from [facility]   Overall Financial Resource Strain (CARDIA)  Difficulty of Paying Living Expenses: Not hard at all  Food Insecurity: No Food Insecurity (09/13/2020)   Received from [facility]   Hunger Vital Sign    Worried About Running Out of Food in the Last Year: Never true    Ran Out of Food in the Last Year: Never true  Transportation Needs: No Transportation Needs (09/13/2020)   Received from [facility], [facility]   [facility] - Transportation    Lack of Transportation (Medical): No    Lack of Transportation (Non-Medical): No  Physical Activity: Not on file  Stress: No Stress Concern Present (06/06/2020)   [employer] of Occupational Health - Occupational Stress Questionnaire    Feeling of Stress : Only a little  Social Connections: Unknown (06/06/2020)   Social Connection and Isolation Panel [NHANES]    Frequency of Communication with Friends and Family: More than three times a week    Frequency of Social Gatherings with Friends and Family: More than three times a week    Attends Religious Services: Not on Marketing executive or Organizations: Not on file    Attends Banker Meetings: Not on file    Marital Status: Married  Catering manager Violence: Not on file    Review of Systems: Gen: Denies any fever, chills, cold or flu like symptoms, pre-syncope, or syncope.  CV: Denies chest pain, heart palpitations. Resp: Denies  shortness of breath, cough.  GI: See HPI GU : Denies urinary burning, urinary frequency, urinary hesitancy MS: Denies joint pain. Heme: See HPI  Physical Exam: BP 125/78 (BP Location: Right Arm, Patient Position: Sitting, Cuff Size: Normal)   Pulse 65   Temp 97.9 F (36.6 C) (Temporal)   Ht 5\' 5"  (1.651 m)   Wt 129 lb 12.8 oz (58.9 kg)   SpO2 99%   BMI 21.60 kg/m  General:   Alert and oriented. Pleasant and cooperative. Well-nourished and well-developed.  Head:  Normocephalic and atraumatic. Eyes:  Without icterus, sclera clear and conjunctiva pink.  Ears:  Normal auditory acuity. Lungs:  Clear to auscultation bilaterally. No wheezes, rales, or rhonchi. No distress.  Heart:  S1, S2 present without murmurs appreciated.  Abdomen:  +BS, soft, non-tender and non-distended. No HSM noted. No guarding or rebound. No masses appreciated.  Rectal:  Deferred  Msk:  Symmetrical without gross deformities. Normal posture. Extremities:  Without edema. Neurologic:  Alert and  oriented x4;  grossly normal neurologically. Skin:  Intact without significant lesions or rashes. Psych:  Normal mood and affect.    Assessment:  62 y.o. female with multiple myeloma, PAD s/p stenting on Plavix and Eliquis, HTN, HLD, GERD, adenomatous colon polyps, presenting today to discuss scheduling surveillance colonoscopy. Also reporting diarrhea and nausea.   History of colon polyps:  Last colonoscopy 10/11/2017 with 2 tubular adenomas removed, diverticulosis in the sigmoid colon and in the descending colon, non-bleeding internal hemorrhoids. Recommended 5 year surveillance.   Diarrhea:  Greater than 1 year history of intermittent diarrhea occurring every couple of weeks and resolving after 3-4 days. Associated abdominal cramping prior to a BM that resolves thereafter.  No alarm symptoms. However, she has had persistent diarrhea for the last 1.5 weeks with 4-6 watery stools daily. She was on antibiotics about 1 month  ago. Discussed completing stool testing to rule out infection, but patient prefers to hold off on this as she feels diarrhea is starting to improve over the last couple  of days with the addition of Activia. Requested she let me know if diarrhea continues, and we will evaluate further.   Regarding the chronic intermittent symptoms, she may have IBS. Symptoms may also be influenced by Revlimid. She is due for a colonoscopy. This will help rule out other pathology such as microscopic colitis. Symptoms are not consistent with IBD. She is currently managing with imodium prn which works well for her.   Nausea:  Morning nausea for the last month. Also waking with hoarseness. Symptoms resolve during the day. She does take omeprazole 40 mg every morning and reports typical GERD symptoms are fairly well controlled. No early satiety, unintentional weight loss, postprandial symptoms, new medications. Query whether symptoms are secondary to nocturnal GERD. Will start famotidine at bedtime.    Plan:  Proceed with colonoscopy with propofol by Dr. Jena Gauss in near future. The risks, benefits, and alternatives have been discussed with the patient in detail. The patient states understanding and desires to proceed.  ASA 3 We will reach out to oncology to get the ok to hold Eliquis x 48 hours prior.  Continue omeprazole 40 mg in the morning.  Start famotidine 20 mg at bedtime. Patient will let me know if diarrhea persist.  Continue Activia daily.  Can try adding daily probiotic.  Follow-up in 3-4 months or sooner if needed.    Ermalinda Memos, PA-C [facility] 10/23/2022

## 2022-10-23 ENCOUNTER — Telehealth: Payer: Self-pay | Admitting: *Deleted

## 2022-10-23 ENCOUNTER — Encounter: Payer: Self-pay | Admitting: Gastroenterology

## 2022-10-23 ENCOUNTER — Ambulatory Visit (INDEPENDENT_AMBULATORY_CARE_PROVIDER_SITE_OTHER): Payer: BC Managed Care – PPO | Admitting: Gastroenterology

## 2022-10-23 VITALS — BP 125/78 | HR 65 | Temp 97.9°F | Ht 65.0 in | Wt 129.8 lb

## 2022-10-23 DIAGNOSIS — K219 Gastro-esophageal reflux disease without esophagitis: Secondary | ICD-10-CM

## 2022-10-23 DIAGNOSIS — R110 Nausea: Secondary | ICD-10-CM | POA: Diagnosis not present

## 2022-10-23 DIAGNOSIS — R197 Diarrhea, unspecified: Secondary | ICD-10-CM | POA: Diagnosis not present

## 2022-10-23 DIAGNOSIS — Z86010 Personal history of colonic polyps: Secondary | ICD-10-CM | POA: Diagnosis not present

## 2022-10-23 MED ORDER — FAMOTIDINE 20 MG PO TABS
20.0000 mg | ORAL_TABLET | Freq: Every day | ORAL | 3 refills | Status: DC
Start: 2022-10-23 — End: 2023-02-15

## 2022-10-23 NOTE — Patient Instructions (Addendum)
We will arrange for ou to have a colonoscopy in September with Dr. Jena Gauss.  We will reach out to your cancer doctor to get the OK to hold Eliquis x 48 hours prior.   Continue omeprazole 40 mg in the morning.   Start famotidine 20 mg in the evening at bedtime to see if this will resolve morning nausea.   Please let me know if you continue with diarrhea and we will work this up further.   Continue eating Christy Gentles as this seems to be helping your diarrhea.   You can try adding a daily probiotic as well.   We will plan to follow-up in 3-4 months or sooner if needed.   It was nice to meet you today!   Ermalinda Memos, PA-C [facility]astroenter[facility]\

## 2022-10-23 NOTE — Telephone Encounter (Signed)
  Request for patient to stop medication prior to procedure or is needing cleareance  10/23/22  Megan Velasquez 1961/02/26  What type of surgery is being performed? Colonoscopy  When is surgery scheduled? TBD  What type of clearance is required (medical or pharmacy to hold medication or both? medication  Are there any medications that need to be held prior to surgery and how long? Eliquis x 2 days  Name of physician performing surgery?  Dr.Rourk [facility] at [employer]: [phone] Fax: [phone]  Anethesia type (none, local, MAC, general)? MAC

## 2022-10-26 ENCOUNTER — Encounter: Payer: Self-pay | Admitting: Gastroenterology

## 2022-10-29 NOTE — Progress Notes (Signed)
HISTORY AND PHYSICAL     CC:  follow up. Requesting Provider:  Delorse Lek, FNP  HPI: This is a 62 y.o. female who is here today for follow up for PAD.  Pt has hx of aortogram with right CIA stent on 12/24/2021 by Dr. Karin Lieu for RLE atheroembolic event.   She has hx of multiple myeloma, treated with Revlimid, both of which lead to hypercoagulable state.  She is on eliquis.    Pt was last seen 01/30/2022 and at that time, she had palpable DP pulse bilatearlly.  She was scheduled for 9 month follow up.   The pt returns today for follow up.  She states she is doing well.  She denies any claudication, rest pain or non healing wounds.  She states she does get some cramping at night.  She quit smoking about 9 years ago.  She is compliant with her plavix and Eliquis and Crestor.  She states that her blood pressure normally runs 120's-130's systolic when she is not at the doctor.    The pt is on a statin for cholesterol management.    The pt is not on an aspirin.    Other AC:  Plavix/Eliquis The pt is on ACEI, diuretic for hypertension.  The pt is not on medication for diabetes. Tobacco hx:  former  Pt does have family hx of AAA with a grandfather.    Past Medical History:  Diagnosis Date   Cancer (HCC)    GERD (gastroesophageal reflux disease)    History of kidney stones    HTN (hypertension)    Multiple myeloma (HCC)    PAD (peripheral artery disease) (HCC)    Renal disorder     Past Surgical History:  Procedure Laterality Date   ABDOMINAL AORTOGRAM W/LOWER EXTREMITY N/A 12/24/2021   Procedure: ABDOMINAL AORTOGRAM W/LOWER EXTREMITY;  Surgeon: Victorino Sparrow, MD;  Location: [facility] INVASIVE CV LAB;  Service: Cardiovascular;  Laterality: N/A;   CHOLECYSTECTOMY     COLONOSCOPY  01/2015   Dr. Teena Dunk: Mild diverticulosis, sessile polyp ranging 3 to 5 mm removed from the proximal transverse colon, semi-pedunculated polyp 5 to 9 mm in size removed from the sigmoid colon.  Sigmoid colon  polyp was serrated adenoma, transverse colon polyp was adenomatous.  Patient was told to have another colonoscopy in 3 years.   COLONOSCOPY WITH PROPOFOL N/A 10/11/2017   Procedure: COLONOSCOPY WITH PROPOFOL;  Surgeon: Corbin Ade, MD;  Location: [facility];  Service: Endoscopy;  Laterality: N/A;  1:15pm   IR IMAGING GUIDED PORT INSERTION  06/05/2020   PERIPHERAL VASCULAR INTERVENTION  12/24/2021   Procedure: PERIPHERAL VASCULAR INTERVENTION;  Surgeon: Victorino Sparrow, MD;  Location: [facility] INVASIVE CV LAB;  Service: Cardiovascular;;   POLYPECTOMY  10/11/2017   Procedure: POLYPECTOMY;  Surgeon: Corbin Ade, MD;  Location: [facility];  Service: Endoscopy;;  descending colon polyps cs times 2    No Known Allergies  Current Outpatient Medications  Medication Sig Dispense Refill   acetaminophen (TYLENOL) 500 MG tablet Take 1,000 mg by mouth every 6 (six) hours as needed for fever or headache.     ADVAIR HFA 230-21 MCG/ACT inhaler Inhale 2 puffs into the lungs 2 (two) times daily. (Patient not taking: Reported on 10/23/2022)     apixaban (ELIQUIS) 2.5 MG TABS tablet Take 1 tablet by mouth twice daily 60 tablet 2   Ascorbic Acid (VITAMIN C) 1000 MG tablet Take 1,000 mg by mouth every morning.  b complex vitamins capsule Take 1 capsule by mouth daily. (Patient taking differently: Take 1 capsule by mouth every morning.) 30 capsule 3   Cholecalciferol (VITAMIN D3) 5000 units CAPS Take 5,000 Units by mouth every morning.     clopidogrel (PLAVIX) 75 MG tablet Take 1 tablet (75 mg total) by mouth daily. 30 tablet 11   Cyanocobalamin (VITAMIN B12) 3000 MCG SUBL Place 3,000 mcg under the tongue every morning.     famotidine (PEPCID) 20 MG tablet Take 1 tablet (20 mg total) by mouth at bedtime. 30 tablet 3   lisinopril-hydrochlorothiazide (ZESTORETIC) 10-12.5 MG tablet Take 1 tablet by mouth daily.     loratadine (CLARITIN) 10 MG tablet Take 10 mg by mouth every morning.     omeprazole (PRILOSEC) 40  MG capsule Take 40 mg by mouth daily as needed (acid reflux).     ondansetron (ZOFRAN) 8 MG tablet Take 1 tablet (8 mg total) by mouth 2 (two) times daily as needed (Nausea or vomiting). 30 tablet 1   REVLIMID 10 MG capsule TAKE 1 CAPSULE BY MOUTH DAILY FOR 21 DAYS, THEN TAKE 7 DAYS OFF 21 capsule 0   rosuvastatin (CRESTOR) 10 MG tablet Take 10 mg by mouth daily.     No current facility-administered medications for this visit.    Family History  Problem Relation Age of Onset   Heart disease Mother    Diabetes Mother    Lung cancer Father    COPD Father    Colon cancer Neg Hx     Social History   Socioeconomic History   Marital status: Married    Spouse name: Not on file   Number of children: Not on file   Years of education: Not on file   Highest education level: Not on file  Occupational History   Not on file  Tobacco Use   Smoking status: Former    Current packs/day: 0.00    Average packs/day: 1 pack/day for 28.0 years (28.0 ttl pk-yrs)    Types: Cigarettes    Start date: 10/08/1985    Quit date: 10/08/2013    Years since quitting: 9.0   Smokeless tobacco: Never  Vaping Use   Vaping status: Never Used  Substance and Sexual Activity   Alcohol use: Not Currently   Drug use: Never   Sexual activity: Yes    Birth control/protection: Post-menopausal  Other Topics Concern   Not on file  Social History Narrative   Not on file   Social Determinants of Health   Financial Resource Strain: Low Risk  (09/13/2020)   Received from [facility], [facility]   Overall Financial Resource Strain (CARDIA)    Difficulty of Paying Living Expenses: Not hard at all  Food Insecurity: No Food Insecurity (09/13/2020)   Received from [facility], [facility]   Hunger Vital Sign    Worried About Running Out of Food in the Last Year: Never true    Ran Out of Food in the Last Year: Never true  Transportation Needs: No Transportation Needs (09/13/2020)   Received from [facility], [facility]   [facility] - Transportation    Lack of Transportation (Medical): No    Lack of Transportation (Non-Medical): No  Physical Activity: Not on file  Stress: No Stress Concern Present (06/06/2020)   [employer] of Occupational Health - Occupational Stress Questionnaire    Feeling of Stress : Only a little  Social Connections: Unknown (06/06/2020)  Social Advertising account executive [NHANES]    Frequency of Communication with Friends and Family: More than three times a week    Frequency of Social Gatherings with Friends and Family: More than three times a week    Attends Religious Services: Not on Marketing executive or Organizations: Not on file    Attends Banker Meetings: Not on file    Marital Status: Married  Catering manager Violence: Not on file     REVIEW OF SYSTEMS:   [X]  denotes positive finding, [ ]  denotes negative finding Cardiac  Comments:  Chest pain or chest pressure:    Shortness of breath upon exertion:    Short of breath when lying flat:    Irregular heart rhythm:        Vascular    Pain in calf, thigh, or hip brought on by ambulation:    Pain in feet at night that wakes you up from your sleep:     Blood clot in your veins:    Leg swelling:         Pulmonary    Oxygen at home:    Productive cough:     Wheezing:         Neurologic    Sudden weakness in arms or legs:     Sudden numbness in arms or legs:     Sudden onset of difficulty speaking or slurred speech:    Temporary loss of vision in one eye:     Problems with dizziness:         Gastrointestinal    Blood in stool:     Vomited blood:         Genitourinary    Burning when urinating:     Blood in urine:        Psychiatric    Major depression:         Hematologic    Bleeding problems:    Problems with blood clotting too easily:        Skin    Rashes or ulcers:        Constitutional    Fever or chills:      PHYSICAL  EXAMINATION:  Today's Vitals   10/30/22 0949  BP: (!) 151/72  Pulse: 62  Resp: 14  Temp: 98.2 F (36.8 C)  TempSrc: Temporal  SpO2: 99%  Weight: 130 lb (59 kg)  Height: 5\' 5"  (1.651 m)  PainSc: 7    Body mass index is 21.63 kg/m.   General:  WDWN in NAD; vital signs documented above Gait: Not observed HENT: WNL, normocephalic Pulmonary: normal non-labored breathing , without wheezing Cardiac: regular HR, without carotid bruits Abdomen: soft, NT; aortic pulse is palpable Skin: without rashes Vascular Exam/Pulses:  Right Left  Radial 2+ (normal) 2+ (normal)  Femoral 2+ (normal) 2+ (normal)  Popliteal Unable to palpate Unable to palpate  DP 2+ (normal) 2+ (normal)   Extremities: without ischemic changes, without Gangrene , without cellulitis; without open wounds Musculoskeletal: no muscle wasting or atrophy  Neurologic: A&O X 3 Psychiatric:  The pt has Normal affect.   Non-Invasive Vascular Imaging:   ABI's/TBI's on 10/30/2022: Right:  1.27/0 - Great toe pressure: 0 Left:  1.04/0.73 - Great toe pressure: 109  Arterial duplex on 10/30/2022: Right Stent(s):  +---------------+--------+--------+---------+--------+  CIA           PSV cm/sStenosisWaveform Comments  +---------------+--------+--------+---------+--------+  Prox to Stent  92  biphasic           +---------------+--------+--------+---------+--------+  Proximal Stent 116             biphasic           +---------------+--------+--------+---------+--------+  Mid Stent      197             triphasic          +---------------+--------+--------+---------+--------+  Distal Stent   127             biphasic           +---------------+--------+--------+---------+--------+  Distal to Stent204     50-74%  biphasic           +---------------+--------+--------+---------+--------+  Summary:  Stenosis: +------------------+-----------------------------+-----------+  Location           Stenosis                     Stent        +------------------+-----------------------------+-----------+  Right Common Iliac>50% stenosis (stent outflow)no stenosis  +------------------+-----------------------------+-----------+    Previous ABI's/TBI's on 01/23/2022: Right:  1.16/0.92 - Great toe pressure: 122 Left:  0.99/0.80 - Great toe pressure:  107  Previous arterial duplex on 01/23/2022: Right Stent(s):  +---------------+--------+--------+---------+--------+  CIA           PSV cm/sStenosisWaveform Comments  +---------------+--------+--------+---------+--------+  Proximal Stent 155             triphasic          +---------------+--------+--------+---------+--------+  Mid Stent      162             triphasic          +---------------+--------+--------+---------+--------+  Distal Stent   186             triphasic          +---------------+--------+--------+---------+--------+  Distal to Stent137             biphasic           +---------------+--------+--------+---------+--------+   Summary:  Stenosis: +------------------+-----------+  Location          Stent        +------------------+-----------+  Right Common Iliacno stenosis  +------------------+-----------+      ASSESSMENT/PLAN:: 62 y.o. female here for follow up for PAD with hx of aortogram with right CIA stent on 12/24/2021 by Dr. Karin Lieu for RLE atheroembolic event.   Hx right iliac artery stenting -pt with easily palpable femoral and DP pulses bilaterally -her duplex shows a mild stenosis distal to the stent but she remains asymptomatic.   -continue plavix, statin, eliquis -pt will f/u in 6 months with right aortoiliac and ABI.  She will call sooner if any issues before then.   Family hx AAA -pt has family hx of AAA with her grandfather.  Her aorta is palpable on exam.   -she is a former smoker.  Given the above, will get a AAA screen when she returns for follow  up in 6 months.  Discussed that if she develops new sudden severe abdominal pain, she should call 911.     Doreatha Massed, [facility] [phone]  Clinic MD:   Karin Lieu

## 2022-10-30 ENCOUNTER — Ambulatory Visit (INDEPENDENT_AMBULATORY_CARE_PROVIDER_SITE_OTHER)
Admission: RE | Admit: 2022-10-30 | Discharge: 2022-10-30 | Disposition: A | Discharge status: Home / Self Care | Payer: BC Managed Care – PPO | Source: Ambulatory Visit | Attending: Vascular Surgery | Admitting: Vascular Surgery

## 2022-10-30 ENCOUNTER — Ambulatory Visit (HOSPITAL_COMMUNITY)
Admission: RE | Admit: 2022-10-30 | Discharge: 2022-10-30 | Disposition: A | Discharge status: Home / Self Care | Payer: BC Managed Care – PPO | Source: Ambulatory Visit | Attending: Vascular Surgery | Admitting: Vascular Surgery

## 2022-10-30 ENCOUNTER — Ambulatory Visit: Payer: BC Managed Care – PPO | Admitting: Physician Assistant

## 2022-10-30 ENCOUNTER — Encounter: Payer: Self-pay | Admitting: Physician Assistant

## 2022-10-30 VITALS — BP 151/72 | HR 62 | Temp 98.2°F | Resp 14 | Ht 65.0 in | Wt 130.0 lb

## 2022-10-30 DIAGNOSIS — L819 Disorder of pigmentation, unspecified: Secondary | ICD-10-CM | POA: Insufficient documentation

## 2022-10-30 DIAGNOSIS — I739 Peripheral vascular disease, unspecified: Secondary | ICD-10-CM | POA: Diagnosis present

## 2022-10-30 DIAGNOSIS — I714 Abdominal aortic aneurysm, without rupture, unspecified: Secondary | ICD-10-CM | POA: Diagnosis not present

## 2022-10-30 DIAGNOSIS — M79604 Pain in right leg: Secondary | ICD-10-CM | POA: Insufficient documentation

## 2022-10-30 DIAGNOSIS — M79605 Pain in left leg: Secondary | ICD-10-CM

## 2022-10-30 LAB — VAS US ABI WITH/WO TBI
Left ABI: 1.04
Right ABI: 1.27

## 2022-11-02 ENCOUNTER — Other Ambulatory Visit: Payer: Self-pay

## 2022-11-02 DIAGNOSIS — C9000 Multiple myeloma not having achieved remission: Secondary | ICD-10-CM

## 2022-11-02 MED ORDER — REVLIMID 10 MG PO CAPS
ORAL_CAPSULE | ORAL | 0 refills | Status: DC
Start: 2022-11-02 — End: 2022-11-27

## 2022-11-04 ENCOUNTER — Other Ambulatory Visit: Payer: Self-pay

## 2022-11-04 DIAGNOSIS — I714 Abdominal aortic aneurysm, without rupture, unspecified: Secondary | ICD-10-CM

## 2022-11-04 DIAGNOSIS — I739 Peripheral vascular disease, unspecified: Secondary | ICD-10-CM

## 2022-11-04 DIAGNOSIS — M79605 Pain in left leg: Secondary | ICD-10-CM

## 2022-11-04 NOTE — Telephone Encounter (Signed)
Spoke with Waynetta Sandy from [facility] office. She provided fax number to send clearance to.  Clearance faxed.

## 2022-11-04 NOTE — Telephone Encounter (Signed)
LMOVM at providers office to call back concerning the clearance that was sent.

## 2022-11-10 ENCOUNTER — Other Ambulatory Visit: Payer: Self-pay

## 2022-11-10 DIAGNOSIS — C9001 Multiple myeloma in remission: Secondary | ICD-10-CM

## 2022-11-11 ENCOUNTER — Other Ambulatory Visit: Payer: BC Managed Care – PPO

## 2022-11-11 ENCOUNTER — Telehealth: Payer: Self-pay

## 2022-11-11 ENCOUNTER — Ambulatory Visit: Payer: BC Managed Care – PPO | Admitting: Hematology

## 2022-11-11 NOTE — Telephone Encounter (Signed)
Left voicemail informing patient that documentation for FMLA for spouse was in progress, but we would need new a signed ROI. Requested call back for information on how she would like to receive the document to sign

## 2022-11-12 ENCOUNTER — Inpatient Hospital Stay: Payer: BC Managed Care – PPO | Admitting: Hematology

## 2022-11-12 ENCOUNTER — Inpatient Hospital Stay: Payer: BC Managed Care – PPO | Attending: Hematology

## 2022-11-12 ENCOUNTER — Other Ambulatory Visit: Payer: Self-pay

## 2022-11-12 VITALS — BP 147/74 | HR 68 | Temp 98.0°F | Resp 18 | Wt 131.7 lb

## 2022-11-12 DIAGNOSIS — Z87891 Personal history of nicotine dependence: Secondary | ICD-10-CM | POA: Insufficient documentation

## 2022-11-12 DIAGNOSIS — D649 Anemia, unspecified: Secondary | ICD-10-CM | POA: Insufficient documentation

## 2022-11-12 DIAGNOSIS — C9001 Multiple myeloma in remission: Secondary | ICD-10-CM | POA: Diagnosis not present

## 2022-11-12 DIAGNOSIS — Z95828 Presence of other vascular implants and grafts: Secondary | ICD-10-CM

## 2022-11-12 DIAGNOSIS — C9000 Multiple myeloma not having achieved remission: Secondary | ICD-10-CM | POA: Diagnosis present

## 2022-11-12 LAB — CMP (CANCER CENTER ONLY)
ALT: 18 U/L (ref 0–44)
AST: 20 U/L (ref 15–41)
Albumin: 4.2 g/dL (ref 3.5–5.0)
Alkaline Phosphatase: 68 U/L (ref 38–126)
Anion gap: 7 (ref 5–15)
BUN: 22 mg/dL (ref 8–23)
CO2: 27 mmol/L (ref 22–32)
Calcium: 9 mg/dL (ref 8.9–10.3)
Chloride: 103 mmol/L (ref 98–111)
Creatinine: 1.04 mg/dL — ABNORMAL HIGH (ref 0.44–1.00)
GFR, Estimated: >60 mL/min (ref >60)
Glucose, Bld: 92 mg/dL (ref 70–99)
Potassium: 3.9 mmol/L (ref 3.5–5.1)
Sodium: 137 mmol/L (ref 135–145)
Total Bilirubin: 0.7 mg/dL (ref 0.3–1.2)
Total Protein: 6.6 g/dL (ref 6.5–8.1)

## 2022-11-12 LAB — CBC WITH DIFFERENTIAL (CANCER CENTER ONLY)
Abs Immature Granulocytes: 0 10*3/uL (ref 0.00–0.07)
Basophils Absolute: 0.1 10*3/uL (ref 0.0–0.1)
Basophils Relative: 2 %
Eosinophils Absolute: 0.1 10*3/uL (ref 0.0–0.5)
Eosinophils Relative: 3 %
HCT: 36.7 % (ref 36.0–46.0)
Hemoglobin: 12.7 g/dL (ref 12.0–15.0)
Immature Granulocytes: 0 %
Lymphocytes Relative: 39 %
Lymphs Abs: 1.2 10*3/uL (ref 0.7–4.0)
MCH: 31.7 pg (ref 26.0–34.0)
MCHC: 34.6 g/dL (ref 30.0–36.0)
MCV: 91.5 fL (ref 80.0–100.0)
Monocytes Absolute: 0.5 10*3/uL (ref 0.1–1.0)
Monocytes Relative: 16 %
Neutro Abs: 1.3 10*3/uL — ABNORMAL LOW (ref 1.7–7.7)
Neutrophils Relative %: 40 %
Platelet Count: 150 10*3/uL (ref 150–400)
RBC: 4.01 MIL/uL (ref 3.87–5.11)
RDW: 13.2 % (ref 11.5–15.5)
WBC Count: 3.2 10*3/uL — ABNORMAL LOW (ref 4.0–10.5)
nRBC: 0 % (ref 0.0–0.2)

## 2022-11-12 MED ORDER — SODIUM CHLORIDE 0.9% FLUSH
10.0000 mL | Freq: Once | INTRAVENOUS | Status: AC
  Administered 2022-11-12: 10 mL

## 2022-11-12 MED ORDER — HEPARIN SOD (PORK) LOCK FLUSH 100 UNIT/ML IV SOLN
500.0000 [IU] | Freq: Once | INTRAVENOUS | Status: AC
  Administered 2022-11-12: 500 [IU]

## 2022-11-12 NOTE — Progress Notes (Signed)
HEMATOLOGY/ONCOLOGY CLINIC NOTE  Date of Service: 11/12/2022  Patient Care Team: Delorse Lek, FNP as PCP - General (Family Medicine) Wyline Mood, Dorothe Pea, MD as PCP - Cardiology (Cardiology) Delorse Lek, FNP as PCP - Family Medicine (Family Medicine) Jena Gauss, Gerrit Friends, MD as Consulting Physician (Gastroenterology)  CHIEF COMPLAINTS/PURPOSE OF CONSULTATION:  Continued follow-up for evaluation and management of multiple myeloma  HISTORY OF PRESENTING ILLNESS:  Please see previous note for details on initial presentation  INTERVAL HISTORY  Megan Velasquez is a 62 y.o. female is here for continued evaluation and management of her multiple myeloma. Patient was last seen by me on 09/02/2022 and complained of a mildly painful infection in her inguinal area with redness and spontaneous drain. She reported previous cataract surgery and complained that her left eye collected condensation under the lens causing vision issues.   Today, she reports that she has been  tolerating Revlimid well with no significant toxicities, but does endorse diarrhea occasionally, which improves with yogurt consumption.   Patient reports that polyps were previously found with previous colonoscopy. She reports that her previous boil/cyst on her labia has resolved.  She reports a uncomfortable wart on her right index finger which has returned after previously being removed.   Patient reports that she will have an eye procedure tomorrow regarding removal of condensation behind the lens.   She denies any new bone pain, leg swelling, skin rashes, or infection issues.   MEDICAL HISTORY:  Past Medical History:  Diagnosis Date   Cancer (HCC)    GERD (gastroesophageal reflux disease)    History of kidney stones    HTN (hypertension)    Multiple myeloma (HCC)    PAD (peripheral artery disease) (HCC)    Renal disorder     SURGICAL HISTORY: Past Surgical History:  Procedure Laterality Date   ABDOMINAL AORTOGRAM  W/LOWER EXTREMITY N/A 12/24/2021   Procedure: ABDOMINAL AORTOGRAM W/LOWER EXTREMITY;  Surgeon: Victorino Sparrow, MD;  Location: [facility] INVASIVE CV LAB;  Service: Cardiovascular;  Laterality: N/A;   CHOLECYSTECTOMY     COLONOSCOPY  01/2015   Dr. Teena Dunk: Mild diverticulosis, sessile polyp ranging 3 to 5 mm removed from the proximal transverse colon, semi-pedunculated polyp 5 to 9 mm in size removed from the sigmoid colon.  Sigmoid colon polyp was serrated adenoma, transverse colon polyp was adenomatous.  Patient was told to have another colonoscopy in 3 years.   COLONOSCOPY WITH PROPOFOL N/A 10/11/2017   Procedure: COLONOSCOPY WITH PROPOFOL;  Surgeon: Corbin Ade, MD;  Location: [facility];  Service: Endoscopy;  Laterality: N/A;  1:15pm   IR IMAGING GUIDED PORT INSERTION  06/05/2020   PERIPHERAL VASCULAR INTERVENTION  12/24/2021   Procedure: PERIPHERAL VASCULAR INTERVENTION;  Surgeon: Victorino Sparrow, MD;  Location: [facility] INVASIVE CV LAB;  Service: Cardiovascular;;   POLYPECTOMY  10/11/2017   Procedure: POLYPECTOMY;  Surgeon: Corbin Ade, MD;  Location: [facility];  Service: Endoscopy;;  descending colon polyps cs times 2    SOCIAL HISTORY: Social History   Socioeconomic History   Marital status: Married    Spouse name: Not on file   Number of children: Not on file   Years of education: Not on file   Highest education level: Not on file  Occupational History   Not on file  Tobacco Use   Smoking status: Former    Current packs/day: 0.00    Average packs/day: 1 pack/day for 28.0 years (28.0 ttl pk-yrs)    Types:  Cigarettes    Start date: 10/08/1985    Quit date: 10/08/2013    Years since quitting: 9.1   Smokeless tobacco: Never  Vaping Use   Vaping status: Never Used  Substance and Sexual Activity   Alcohol use: Not Currently   Drug use: Never   Sexual activity: Yes    Birth control/protection: Post-menopausal  Other Topics Concern   Not on file  Social History Narrative   Not on  file   Social Determinants of Health   Financial Resource Strain: Low Risk  (09/13/2020)   [facility], [facility]   Overall Financial Resource Strain (CARDIA)    Difficulty of Paying Living Expenses: Not hard at all  Food Insecurity: No Food Insecurity (09/13/2020)   Received from [facility], [facility]   Hunger Vital Sign    Worried About Running Out of Food in the Last Year: Never true    Ran Out of Food in the Last Year: Never true  Transportation Needs: No Transportation Needs (09/13/2020)   Received from [facility], [facility]   [facility] - Transportation    Lack of Transportation (Medical): No    Lack of Transportation (Non-Medical): No  Physical Activity: Not on file  Stress: No Stress Concern Present (06/06/2020)   [employer] of Occupational Health - Occupational Stress Questionnaire    Feeling of Stress : Only a little  Social Connections: Unknown (06/06/2020)   Social Connection and Isolation Panel [NHANES]    Frequency of Communication with Friends and Family: More than three times a week    Frequency of Social Gatherings with Friends and Family: More than three times a week    Attends Religious Services: Not on Marketing executive or Organizations: Not on file    Attends Banker Meetings: Not on file    Marital Status: Married  Catering manager Violence: Not on file    FAMILY HISTORY: Family History  Problem Relation Age of Onset   Heart disease Mother    Diabetes Mother    Lung cancer Father    COPD Father    Colon cancer Neg Hx     ALLERGIES:  has No Known Allergies.  MEDICATIONS:  Current Outpatient Medications  Medication Sig Dispense Refill   acetaminophen (TYLENOL) 500 MG tablet Take 1,000 mg by mouth every 6 (six) hours as needed for fever or headache.     ADVAIR HFA 230-21 MCG/ACT inhaler Inhale 2 puffs into the lungs 2 (two) times daily. (Patient not taking: Reported on 10/23/2022)      apixaban (ELIQUIS) 2.5 MG TABS tablet Take 1 tablet by mouth twice daily 60 tablet 2   Ascorbic Acid (VITAMIN C) 1000 MG tablet Take 1,000 mg by mouth every morning.     b complex vitamins capsule Take 1 capsule by mouth daily. (Patient taking differently: Take 1 capsule by mouth every morning.) 30 capsule 3   Cholecalciferol (VITAMIN D3) 5000 units CAPS Take 5,000 Units by mouth every morning.     clopidogrel (PLAVIX) 75 MG tablet Take 1 tablet (75 mg total) by mouth daily. 30 tablet 11   Cyanocobalamin (VITAMIN B12) 3000 MCG SUBL Place 3,000 mcg under the tongue every morning.     famotidine (PEPCID) 20 MG tablet Take 1 tablet (20 mg total) by mouth at bedtime. 30 tablet 3   lisinopril-hydrochlorothiazide (ZESTORETIC) 10-12.5 MG tablet Take 1 tablet by mouth daily.  loratadine (CLARITIN) 10 MG tablet Take 10 mg by mouth every morning.     omeprazole (PRILOSEC) 40 MG capsule Take 40 mg by mouth daily as needed (acid reflux).     ondansetron (ZOFRAN) 8 MG tablet Take 1 tablet (8 mg total) by mouth 2 (two) times daily as needed (Nausea or vomiting). 30 tablet 1   REVLIMID 10 MG capsule TAKE 1 CAPSULE BY MOUTH DAILY FOR 21 DAYS, THEN TAKE 7 DAYS OFF 21 capsule 0   rosuvastatin (CRESTOR) 10 MG tablet Take 10 mg by mouth daily.     No current facility-administered medications for this visit.    REVIEW OF SYSTEMS:    10 Point review of Systems was done is negative except as noted above.   PHYSICAL EXAMINATION:  ECOG PERFORMANCE STATUS: 1 - Symptomatic but completely ambulatory .BP (!) 147/74   Pulse 68   Temp 98 F (36.7 C)   Resp 18   Wt 131 lb 11.2 oz (59.7 kg)   SpO2 100%   BMI 21.92 kg/m  GENERAL:alert, in no acute distress and comfortable SKIN: no acute rashes, no significant lesions EYES: conjunctiva are pink and non-injected, sclera anicteric OROPHARYNX: MMM, no exudates, no oropharyngeal erythema or ulceration NECK: supple, no JVD LYMPH:  no palpable lymphadenopathy in  the cervical, axillary or inguinal regions LUNGS: clear to auscultation b/l with normal respiratory effort HEART: regular rate & rhythm ABDOMEN:  normoactive bowel sounds , non tender, not distended. Extremity: no pedal edema PSYCH: alert & oriented x 3 with fluent speech NEURO: no focal motor/sensory deficits   LABORATORY DATA:  I have reviewed the data as listed  .    Latest Ref Rng & Units 11/12/2022   10:37 AM 09/02/2022    9:55 AM 06/24/2022   10:10 AM  CBC  WBC 4.0 - 10.5 K/uL 3.2  4.8  5.4   Hemoglobin 12.0 - 15.0 g/dL 13.2  44.0  10.2   Hematocrit 36.0 - 46.0 % 36.7  36.2  37.2   Platelets 150 - 400 K/uL 150  168  236     .    Latest Ref Rng & Units 11/12/2022   10:37 AM 09/02/2022    9:55 AM 06/24/2022   10:10 AM  CMP  Glucose 70 - 99 mg/dL 92  97  725   BUN 8 - 23 mg/dL 22  24  18    Creatinine 0.44 - 1.00 mg/dL 3.66  4.40  3.47   Sodium 135 - 145 mmol/L 137  137  135   Potassium 3.5 - 5.1 mmol/L 3.9  4.3  3.9   Chloride 98 - 111 mmol/L 103  102  103   CO2 22 - 32 mmol/L 27  28  25    Calcium 8.9 - 10.3 mg/dL 9.0  8.8  9.0   Total Protein 6.5 - 8.1 g/dL 6.6  6.6  6.4   Total Bilirubin 0.3 - 1.2 mg/dL 0.7  0.5  0.4   Alkaline Phos 38 - 126 U/L 68  73  77   AST 15 - 41 U/L 20  17  15    ALT 0 - 44 U/L 18  17  14       Most recent lab results (03/15/2020) of CBC is as follows: all values are WNL except for RBC at 3.66, Hgb at 9.7, HCT at 31.8, MCHC at 30.5, RDW Standard Deviation 58.7, Red Cell Distribution Width At 18.6,  BUN at 19, Creatinine at 1.06, Total Protein at 10.2,  Albumin at 2.7, AST at 14,. 03/13/2020 UPEP shows no M-Spike, all values are WNL 03/06/2020 SPEP shows Total Protein at 10.1, Beta Globulin at 5.2, M-Spike at 4.3, Total Globulin at 6.4, A/G Ratio at 0.6 03/06/2020 Free Kappa Light Chains at 181.1, Free Lambda Light Chains Serum at 3.6, K/L ratio at 50.31 03/06/2020 Beta-2 Microglobulin at 2.5           RADIOGRAPHIC STUDIES: I have  personally reviewed the radiological images as listed and agreed with the findings in the report. VAS US AORTA/IVC/ILIACS  Result Date: 10/30/2022 ABDOMINAL AORTA STUDY Patient Name:  Megan Velasquez  Date of Exam:   10/30/2022 Medical Rec #: [id number]     Accession #:    [id number] Date of Birth: [birth year]     Patient Gender: F Patient Age:   85 years Exam Location:  [facility] Procedure:      VAS US AORTA/IVC/ILIACS Referring Phys: Gerarda Fraction --------------------------------------------------------------------------------  Indications: Follow up right CIA stent Risk Factors: Hypertension, past history of smoking. Vascular Interventions: 12/24/21 Aortogram. Bilateral lower extremity angiogram.                         Right common iliac artery 8 x 29mm Gore VBX stent. Limitations: Air/bowel gas.  Comparison Study: 01/23/22 Aorta/iliac artery duplex- right common iliac artery-                   no stenosis. Performing Technologist: Gertie Fey MHA, RDMS, RVT, RDCS  Examination Guidelines: A complete evaluation includes B-mode imaging, spectral Doppler, color Doppler, and power Doppler as needed of all accessible portions of each vessel. Bilateral testing is considered an integral part of a complete examination. Limited examinations for reoccurring indications may be performed as noted.  Right Stent(s): +---------------+--------+--------+---------+--------+ CIA            PSV cm/sStenosisWaveform Comments +---------------+--------+--------+---------+--------+ Prox to Stent  92              biphasic          +---------------+--------+--------+---------+--------+ Proximal Stent 116             biphasic          +---------------+--------+--------+---------+--------+ Mid Stent      197             triphasic         +---------------+--------+--------+---------+--------+ Distal Stent   127             biphasic           +---------------+--------+--------+---------+--------+ Distal to Stent204     50-74%  biphasic          +---------------+--------+--------+---------+--------+    Summary: Stenosis: +------------------+-----------------------------+-----------+ Location          Stenosis                     Stent       +------------------+-----------------------------+-----------+ Right Common Iliac>50% stenosis (stent outflow)no stenosis +------------------+-----------------------------+-----------+   *See table(s) above for measurements and observations.  Electronically signed by Gerarda Fraction on 10/30/2022 at 5:11:18 PM.    Final    VAS [place] ABI WITH/WO TBI  Result Date: 10/30/2022  LOWER EXTREMITY DOPPLER STUDY Patient Name:  Megan Velasquez  Date of Exam:   10/30/2022 Medical Rec #: [id number]     Accession #:    [id number] Date of Birth: [birth year]     Patient Gender: F Patient Age:   3 years Exam  Location:  [facility] Procedure:      VAS [place] ABI WITH/WO TBI Referring Phys: JOSHUA ROBINS --------------------------------------------------------------------------------  Indications: Peripheral artery disease. High Risk Factors: Hypertension, past history of smoking.  Vascular Interventions: 12/24/21 Aortogram. Bilateral lower extremity angiogram.                         Right common iliac artery 8 x 29mm Gore VBX stent. Comparison Study: 01/23/2022 ABI/TBI- 01/23/22 ABI/TBI- right=1.16/0.92,                   left=0.99/0.80 Performing Technologist: Gertie Fey MHA, RVT, RDCS, RDMS  Examination Guidelines: A complete evaluation includes at minimum, Doppler waveform signals and systolic blood pressure reading at the level of bilateral brachial, anterior tibial, and posterior tibial arteries, when vessel segments are accessible. Bilateral testing is considered an integral part of a complete examination. Photoelectric Plethysmograph (PPG) waveforms and toe systolic pressure readings are included as  required and additional duplex testing as needed. Limited examinations for reoccurring indications may be performed as noted.  ABI Findings: +--------+------------------+-----+---------+--------+ Right   Rt Pressure (mmHg)IndexWaveform Comment  +--------+------------------+-----+---------+--------+ Brachial150                                      +--------+------------------+-----+---------+--------+ PTA     191               1.27 triphasic         +--------+------------------+-----+---------+--------+ DP      136               0.91 triphasic         +--------+------------------+-----+---------+--------+ +---------+------------------+-----+---------+-------+ Left     Lt Pressure (mmHg)IndexWaveform Comment +---------+------------------+-----+---------+-------+ Brachial 148                                     +---------+------------------+-----+---------+-------+ PTA      156               1.04 biphasic         +---------+------------------+-----+---------+-------+ DP       148               0.99 triphasic        +---------+------------------+-----+---------+-------+ Great Toe109               0.73                  +---------+------------------+-----+---------+-------+ +-------+-----------+-----------+------------+------------+ ABI/TBIToday's ABIToday's TBIPrevious ABIPrevious TBI +-------+-----------+-----------+------------+------------+ Right  1.27       0.00       1.16        0.92         +-------+-----------+-----------+------------+------------+ Left   1.04       0.73       0.99        0.80         +-------+-----------+-----------+------------+------------+   Summary: Right: Resting right ankle-brachial index is within normal range. The right toe-brachial index is abnormal. Left: Resting left ankle-brachial index is within normal range. The left toe-brachial index is normal. *See table(s) above for measurements and observations.   Electronically signed by Gerarda Fraction on 10/30/2022 at 5:11:09 PM.    Final     ASSESSMENT & PLAN:   62 year old female here for continued follow-up, evaluation and  management of multiple myeloma  #1  IgA kappa multiple myeloma status post induction treatment with Carfilzomib Revlimid dexamethasone  presenting with anemia hemoglobin 9.4. Creatinine 1.28 No hypercalcemia Bone survey with no obvious skeletal lesions. Baseline M spike of 4.3 g/dL. Beta-2 Microglobulin at 2.5 Mol Cy Monosomy 13 and dup 1q  2) status post stem cell collection assisted with G-CSF and Plerixafor. Currently on maintenance Revlimid. Currently had declined proceeding with consolidative autologous HSCT  3) Diarrhea - ? Related to Revlimid.  Mild and nearly resolved GI panel and c diff neg  PLAN:  -Discussed lab results on 11/12/2022 in detail with patient. CBC showed WBC of 3.2K, hemoglobin of 12.7, and platelets of 150K. -CMP stable -myeloma panel pending -Last abnormal protein visible was  09/12/2020 -last myeloma panel from 09/02/2022 showed undetectable M protein -Patient reports no significant toxicities from her current dose of Revlimid at this time.  -Patient has no symptom suggestive of disease progression  -continue 10 mg Revlimid for maintenance. -patient will be 2 years into remission on November 2024 -okay to hold Eliquis for 48 hours prior to colonoscopy and restart when gastroenterologist believes that there is no concern for bleeding -Advised patient to hold Revlimid for a few days prior to stopping Eliquis then restart when restarting eliquis as advised by gastroenterologist -if there is no need to remove polyps, patient may be recommended to start Eliquis the next day following colonoscopy. If there is a role to remove polyps, patient may be advised to hold Eliquis for a week. Patient shall then restart Revlimid once the gastroenterologist advises that patient may restart Eliquis.  -answered all  of patient's questions regarding medication use with colonoscopy procedure -advised patient to connect with eye specialist to discuss whether there is a need to hold blood thinners prior to her upcoming eye procedure depending on the type of procedure.  -Discussed option of OTC ointment to freeze spot/wart in right index finger -discussed option for spot on right index finger to be evaluated by dermatologist or surgeon  FOLLOW-UP: RTC with Dr Candise Che with labs in 2 months  The total time spent in the appointment was 30 minutes* .  All of the patient's questions were answered with apparent satisfaction. The patient knows to call the clinic with any problems, questions or concerns.   Wyvonnia Lora MD MS AAHIVMS [facility] Hematology/Oncology Physician [facility]  .*Total Encounter Time as defined by the Centers for Medicare and Medicaid Services includes, in addition to the face-to-face time of a patient visit (documented in the note above) non-face-to-face time: obtaining and reviewing outside history, ordering and reviewing medications, tests or procedures, care coordination (communications with other health care professionals or caregivers) and documentation in the medical record.    I,Mitra Faeizi,acting as a Neurosurgeon for Wyvonnia Lora, MD.,have documented all relevant documentation on the behalf of Wyvonnia Lora, MD,as directed by  Wyvonnia Lora, MD while in the presence of Wyvonnia Lora, MD.   .I have reviewed the above documentation for accuracy and completeness, and I agree with the above. Johney Maine MD

## 2022-11-15 ENCOUNTER — Encounter: Payer: Self-pay | Admitting: Hematology

## 2022-11-17 ENCOUNTER — Other Ambulatory Visit: Payer: Self-pay | Admitting: Vascular Surgery

## 2022-11-18 ENCOUNTER — Telehealth: Payer: Self-pay

## 2022-11-18 NOTE — Telephone Encounter (Signed)
Spoke with patient informing her that documentation for her spouse's disability had been completed and confirmed receipt of ROI via email. Faxed documents to company with fax conformation received. Per pt request copy of documents mailed

## 2022-11-19 NOTE — Telephone Encounter (Signed)
Clearance scanned to media tab. Please advise. Thank you

## 2022-11-19 NOTE — Telephone Encounter (Signed)
Noted. Will schedule pt once we get providers schedule for Oct.

## 2022-11-19 NOTE — Telephone Encounter (Signed)
Reviewed.  Okay was given to hold Eliquis for 2 days prior to procedure.  Also advised to hold Revlimid while off of Eliquis. Please proceed with scheduling.

## 2022-11-25 ENCOUNTER — Other Ambulatory Visit: Payer: Self-pay | Admitting: Hematology

## 2022-11-25 DIAGNOSIS — C9000 Multiple myeloma not having achieved remission: Secondary | ICD-10-CM

## 2022-11-27 ENCOUNTER — Encounter: Payer: Self-pay | Admitting: Hematology

## 2022-11-27 ENCOUNTER — Other Ambulatory Visit: Payer: Self-pay

## 2022-12-14 ENCOUNTER — Other Ambulatory Visit: Payer: Self-pay | Admitting: Hematology

## 2022-12-14 DIAGNOSIS — C9001 Multiple myeloma in remission: Secondary | ICD-10-CM

## 2022-12-21 MED ORDER — PEG 3350-KCL-NA BICARB-NACL 420 G PO SOLR: 4000.0000 mL | Freq: Once | ORAL | 0 refills | Status: AC

## 2022-12-21 NOTE — Telephone Encounter (Addendum)
Spoke with pt. She is scheduled for 10/2. Aware will send instructions and pre-op to mychart. Rx for prep to pharmacy.   Checked [employer] for PA for procedure "he following solutions for the service date entered do not require Pre-Authorization by [employer]. Please note that benefit limits, if applicable, will still be applied. Contact the health plan using the number on the back of the member's ID card if you have any questions regarding coverage or Pre-Authorization requirements."

## 2022-12-21 NOTE — Addendum Note (Signed)
Addended by: Armstead Peaks on: 12/21/2022 09:48 AM   Modules accepted: Orders

## 2022-12-22 ENCOUNTER — Encounter: Payer: Self-pay | Admitting: *Deleted

## 2022-12-22 ENCOUNTER — Other Ambulatory Visit: Payer: Self-pay | Admitting: Hematology

## 2022-12-22 DIAGNOSIS — C9001 Multiple myeloma in remission: Secondary | ICD-10-CM

## 2022-12-24 ENCOUNTER — Ambulatory Visit: Payer: BC Managed Care – PPO | Admitting: Gastroenterology

## 2022-12-30 ENCOUNTER — Other Ambulatory Visit: Payer: Self-pay | Admitting: Hematology

## 2022-12-30 DIAGNOSIS — C9000 Multiple myeloma not having achieved remission: Secondary | ICD-10-CM

## 2023-01-01 ENCOUNTER — Other Ambulatory Visit: Payer: Self-pay

## 2023-01-01 ENCOUNTER — Encounter: Payer: Self-pay | Admitting: Hematology

## 2023-01-01 NOTE — Patient Instructions (Signed)
Megan Velasquez  01/01/2023     @PREFPERIOPPHARMACY @   Your procedure is scheduled on 01/06/2023.  Report to [facility] at 11:15 A.M.  Call this number if you have problems the morning of surgery:  [phone]  If you experience any cold or flu symptoms such as cough, fever, chills, shortness of breath, etc. between now and your scheduled surgery, please notify us at the above number.   Remember:   Please follow the diet and prep instructions given to you by Dr Luvenia Starch office.   Last does of Eliquis should be on 01/03/23     Take these medicines the morning of surgery with A SIP OF WATER : Pepcid Claritin Prilosec and Zofran as needed.  Use your inhaler before coming and bring your rescue inhaler with you.     Do not wear jewelry, make-up or nail polish, including gel polish,  artificial nails, or any other type of covering on natural nails (fingers and  toes).  Do not wear lotions, powders, or perfumes, or deodorant.  Do not shave 48 hours prior to surgery.  Men may shave face and neck.  Do not bring valuables to the hospital.  [facility] is not responsible for any belongings or valuables.  Contacts, dentures or bridgework may not be worn into surgery.  Leave your suitcase in the car.  After surgery it may be brought to your room.  For patients admitted to the hospital, discharge time will be determined by your treatment team.  Patients discharged the day of surgery will not be allowed to drive home.   Name and phone number of your driver:   Family Special instructions:  N/A  Please read over the following fact sheets that you were given. Care and Recovery After Surgery  Colonoscopy, Adult A colonoscopy is a procedure to look at the entire large intestine. This procedure is done using a long, thin, flexible tube that has a camera on the end. You may have a colonoscopy: As a part of normal colorectal screening. If you have certain symptoms, such as: A low number of  red blood cells in your blood (anemia). Diarrhea that does not go away. Pain in your abdomen. Blood in your stool. A colonoscopy can help screen for and diagnose medical problems, including: An abnormal growth of cells or tissue (tumor). Abnormal growths within the lining of your intestine (polyps). Inflammation. Areas of bleeding. Tell your health care provider about: Any allergies you have. All medicines you are taking, including vitamins, herbs, eye drops, creams, and over-the-counter medicines. Any problems you or family members have had with anesthetic medicines. Any bleeding problems you have. Any surgeries you have had. Any medical conditions you have. Any problems you have had with having bowel movements. Whether you are pregnant or may be pregnant. What are the risks? Generally, this is a safe procedure. However, problems may occur, including: Bleeding. Damage to your intestine. Allergic reactions to medicines given during the procedure. Infection. This is rare. What happens before the procedure? Eating and drinking restrictions Follow instructions from your health care provider about eating or drinking restrictions, which may include: A few days before the procedure: Follow a low-fiber diet. Avoid nuts, seeds, dried fruit, raw fruits, and vegetables. 1-3 days before the procedure: Eat only gelatin dessert or ice pops. Drink only clear liquids, such as water, clear juice, clear broth or bouillon, black coffee or tea, or clear soft drinks or sports drinks. Avoid liquids that contain red or purple dye.  The day of the procedure: Do not eat solid foods. You may continue to drink clear liquids until up to 2 hours before the procedure. Do not eat or drink anything starting 2 hours before the procedure, or within the time period that your health care provider recommends. Bowel prep If you were prescribed a bowel prep to take by mouth (orally) to clean out your colon: Take it  as told by your health care provider. Starting the day before your procedure, you will need to drink a large amount of liquid medicine. The liquid will cause you to have many bowel movements of loose stool until your stool becomes almost clear or light green. If your skin or the opening between the buttocks (anus) gets irritated from diarrhea, you may relieve the irritation using: Wipes with medicine in them, such as adult wet wipes with aloe and vitamin E. A product to soothe skin, such as petroleum jelly. If you vomit while drinking the bowel prep: Take a break for up to 60 minutes. Begin the bowel prep again. Call your health care provider if you keep vomiting or you cannot take the bowel prep without vomiting. To clean out your colon, you may also be given: Laxative medicines. These help you have a bowel movement. Instructions for enema use. An enema is liquid medicine injected into your rectum. Medicines Ask your health care provider about: Changing or stopping your regular medicines or supplements. This is especially important if you are taking iron supplements, diabetes medicines, or blood thinners. Taking medicines such as aspirin and ibuprofen. These medicines can thin your blood. Do not take these medicines unless your health care provider tells you to take them. Taking over-the-counter medicines, vitamins, herbs, and supplements. General instructions Ask your health care provider what steps will be taken to help prevent infection. These may include washing skin with a germ-killing soap. If you will be going home right after the procedure, plan to have a responsible adult: Take you home from the hospital or clinic. You will not be allowed to drive. Care for you for the time you are told. What happens during the procedure?  An IV will be inserted into one of your veins. You will be given a medicine to make you fall asleep (general anesthetic). You will lie on your side with your  knees bent. A lubricant will be put on the tube. Then the tube will be: Inserted into your anus. Gently eased through all parts of your large intestine. Air will be sent into your colon to keep it open. This may cause some pressure or cramping. Images will be taken with the camera and will appear on a screen. A small tissue sample may be removed to be looked at under a microscope (biopsy). The tissue may be sent to a lab for testing if any signs of problems are found. If small polyps are found, they may be removed and checked for cancer cells. When the procedure is finished, the tube will be removed. The procedure may vary among health care providers and hospitals. What happens after the procedure? Your blood pressure, heart rate, breathing rate, and blood oxygen level will be monitored until you leave the hospital or clinic. You may have a small amount of blood in your stool. You may pass gas and have mild cramping or bloating in your abdomen. This is caused by the air that was used to open your colon during the exam. If you were given a sedative during the procedure, it  can affect you for several hours. Do not drive or operate machinery until your health care provider says that it is safe. It is up to you to get the results of your procedure. Ask your health care provider, or the department that is doing the procedure, when your results will be ready. Summary A colonoscopy is a procedure to look at the entire large intestine. Follow instructions from your health care provider about eating and drinking before the procedure. If you were prescribed an oral bowel prep to clean out your colon, take it as told by your health care provider. During the colonoscopy, a flexible tube with a camera on its end is inserted into the anus and then passed into all parts of the large intestine. This information is not intended to replace advice given to you by your health care provider. Make sure you discuss  any questions you have with your health care provider. Document Revised: 05/05/2022 Document Reviewed: 11/13/2020 Elsevier Patient Education  2024 [employer]  Monitored Anesthesia Care Anesthesia refers to the techniques, procedures, and medicines that help a person stay safe and comfortable during surgery. Monitored anesthesia care, or sedation, is one type of anesthesia. You may have sedation if you do not need to be asleep for your procedure. Procedures that use sedation may include: Surgery to remove cataracts from your eyes. A dental procedure. A biopsy. This is when a tissue sample is removed and looked at under a microscope. You will be watched closely during your procedure. Your level of sedation or type of anesthesia may be changed to fit your needs. Tell a health care provider about: Any allergies you have. All medicines you are taking, including vitamins, herbs, eye drops, creams, and over-the-counter medicines. Any problems you or family members have had with anesthesia. Any bleeding problems you have. Any surgeries you have had. Any medical conditions or illnesses you have. This includes sleep apnea, cough, fever, or the flu. Whether you are pregnant or may be pregnant. Whether you use cigarettes, alcohol, or drugs. Any use of steroids, whether by mouth or as a cream. What are the risks? Your health care provider will talk with you about risks. These may include: Getting too much medicine (oversedation). Nausea. Allergic reactions to medicines. Trouble breathing. If this happens, a breathing tube may be used to help you breathe. It will be removed when you are awake and breathing on your own. Heart trouble. Lung trouble. Confusion that gets better with time (emergence delirium). What happens before the procedure? When to stop eating and drinking Follow instructions from your health care provider about what you may eat and drink. These may include: 8 hours before your  procedure Stop eating most foods. Do not eat meat, fried foods, or fatty foods. Eat only light foods, such as toast or crackers. All liquids are okay except energy drinks and alcohol. 6 hours before your procedure Stop eating. Drink only clear liquids, such as water, clear fruit juice, black coffee, plain tea, and sports drinks. Do not drink energy drinks or alcohol. 2 hours before your procedure Stop drinking all liquids. You may be allowed to take medicines with small sips of water. If you do not follow your health care provider's instructions, your procedure may be delayed or canceled. Medicines Ask your health care provider about: Changing or stopping your regular medicines. These include any diabetes medicines or blood thinners you take. Taking medicines such as aspirin and ibuprofen. These medicines can thin your blood. Do not take  them unless your health care provider tells you to. Taking over-the-counter medicines, vitamins, herbs, and supplements. Testing You may have an exam or testing. You may have a blood or urine sample taken. General instructions Do not use any products that contain nicotine or tobacco for at least 4 weeks before the procedure. These products include cigarettes, chewing tobacco, and vaping devices, such as e-cigarettes. If you need help quitting, ask your health care provider. If you will be going home right after the procedure, plan to have a responsible adult: Take you home from the hospital or clinic. You will not be allowed to drive. Care for you for the time you are told. What happens during the procedure?  Your blood pressure, heart rate, breathing, level of pain, and blood oxygen level will be monitored. An IV will be inserted into one of your veins. You may be given: A sedative. This helps you relax. Anesthesia. This will: Numb certain areas of your body. Make you fall asleep for surgery. You will be given medicines as needed to keep you  comfortable. The more medicine you are given, the deeper your level of sedation will be. Your level of sedation may be changed to fit your needs. There are three levels of sedation: Mild sedation. At this level, you may feel awake and relaxed. You will be able to follow directions. Moderate sedation. At this level, you will be sleepy. You may not remember the procedure. Deep sedation. At this level, you will be asleep. You will not remember the procedure. How you get the medicines will depend on your age and the procedure. They may be given as: A pill. This may be taken by mouth (orally) or inserted into the rectum. An injection. This may be into a vein or muscle. A spray through the nose. After your procedure is over, the medicine will be stopped. The procedure may vary among health care providers and hospitals. What happens after the procedure? Your blood pressure, heart rate, breathing rate, and blood oxygen level will be monitored until you leave the hospital or clinic. You may feel sleepy, clumsy, or nauseous. You may not remember what happened during or after the procedure. Sedation can affect you for several hours. Do not drive or use machinery until your health care provider says that it is safe. This information is not intended to replace advice given to you by your health care provider. Make sure you discuss any questions you have with your health care provider. Document Revised: 08/18/2021 Document Reviewed: 08/18/2021 Elsevier Patient Education  2024 [employer].

## 2023-01-04 ENCOUNTER — Encounter (HOSPITAL_COMMUNITY): Payer: Self-pay

## 2023-01-04 ENCOUNTER — Encounter (HOSPITAL_COMMUNITY)
Admission: RE | Admit: 2023-01-04 | Discharge: 2023-01-04 | Disposition: A | Discharge status: Home / Self Care | Payer: BC Managed Care – PPO | Source: Ambulatory Visit | Attending: Internal Medicine | Admitting: Internal Medicine

## 2023-01-04 VITALS — BP 127/66 | HR 67 | Temp 98.4°F | Resp 18 | Ht 63.0 in | Wt 131.6 lb

## 2023-01-04 DIAGNOSIS — Z7901 Long term (current) use of anticoagulants: Secondary | ICD-10-CM | POA: Diagnosis not present

## 2023-01-04 DIAGNOSIS — K219 Gastro-esophageal reflux disease without esophagitis: Secondary | ICD-10-CM | POA: Diagnosis not present

## 2023-01-04 DIAGNOSIS — Z79899 Other long term (current) drug therapy: Secondary | ICD-10-CM | POA: Diagnosis not present

## 2023-01-04 DIAGNOSIS — C9000 Multiple myeloma not having achieved remission: Secondary | ICD-10-CM | POA: Diagnosis not present

## 2023-01-04 DIAGNOSIS — I10 Essential (primary) hypertension: Secondary | ICD-10-CM

## 2023-01-04 DIAGNOSIS — Z01818 Encounter for other preprocedural examination: Secondary | ICD-10-CM | POA: Insufficient documentation

## 2023-01-04 DIAGNOSIS — N289 Disorder of kidney and ureter, unspecified: Secondary | ICD-10-CM | POA: Insufficient documentation

## 2023-01-04 DIAGNOSIS — Z860101 Personal history of adenomatous and serrated colon polyps: Secondary | ICD-10-CM | POA: Diagnosis not present

## 2023-01-04 DIAGNOSIS — Z87891 Personal history of nicotine dependence: Secondary | ICD-10-CM | POA: Diagnosis not present

## 2023-01-04 DIAGNOSIS — K5730 Diverticulosis of large intestine without perforation or abscess without bleeding: Secondary | ICD-10-CM | POA: Diagnosis not present

## 2023-01-04 DIAGNOSIS — Z7902 Long term (current) use of antithrombotics/antiplatelets: Secondary | ICD-10-CM | POA: Diagnosis not present

## 2023-01-04 DIAGNOSIS — Z1211 Encounter for screening for malignant neoplasm of colon: Secondary | ICD-10-CM | POA: Diagnosis present

## 2023-01-04 LAB — BASIC METABOLIC PANEL
Anion gap: 9 (ref 5–15)
BUN: 23 mg/dL (ref 8–23)
CO2: 28 mmol/L (ref 22–32)
Calcium: 8.9 mg/dL (ref 8.9–10.3)
Chloride: 102 mmol/L (ref 98–111)
Creatinine, Ser: 1.13 mg/dL — ABNORMAL HIGH (ref 0.44–1.00)
GFR, Estimated: 55 mL/min — ABNORMAL LOW (ref >60)
Glucose, Bld: 86 mg/dL (ref 70–99)
Potassium: 4 mmol/L (ref 3.5–5.1)
Sodium: 139 mmol/L (ref 135–145)

## 2023-01-05 ENCOUNTER — Telehealth: Payer: Self-pay | Admitting: *Deleted

## 2023-01-05 NOTE — Telephone Encounter (Signed)
Pt called and states she has diarrhea and does not think she can drink the prep for her colonoscopy in the morning. She is concerned with getting dehydrated and because she has low kidney function. Please advise.

## 2023-01-06 ENCOUNTER — Ambulatory Visit (HOSPITAL_COMMUNITY): Payer: BC Managed Care – PPO | Admitting: Anesthesiology

## 2023-01-06 ENCOUNTER — Encounter (HOSPITAL_COMMUNITY): Payer: Self-pay | Admitting: Internal Medicine

## 2023-01-06 ENCOUNTER — Encounter (HOSPITAL_COMMUNITY)
Admission: RE | Disposition: A | Discharge status: Home / Self Care | Payer: Self-pay | Source: Home / Self Care | Attending: Internal Medicine

## 2023-01-06 ENCOUNTER — Ambulatory Visit (HOSPITAL_COMMUNITY)
Admission: RE | Admit: 2023-01-06 | Discharge: 2023-01-06 | Disposition: A | Discharge status: Home / Self Care | Payer: BC Managed Care – PPO | Attending: Internal Medicine | Admitting: Internal Medicine

## 2023-01-06 DIAGNOSIS — Z1211 Encounter for screening for malignant neoplasm of colon: Secondary | ICD-10-CM | POA: Insufficient documentation

## 2023-01-06 DIAGNOSIS — C9000 Multiple myeloma not having achieved remission: Secondary | ICD-10-CM | POA: Insufficient documentation

## 2023-01-06 DIAGNOSIS — I10 Essential (primary) hypertension: Secondary | ICD-10-CM | POA: Insufficient documentation

## 2023-01-06 DIAGNOSIS — Z860101 Personal history of adenomatous and serrated colon polyps: Secondary | ICD-10-CM | POA: Diagnosis not present

## 2023-01-06 DIAGNOSIS — K5730 Diverticulosis of large intestine without perforation or abscess without bleeding: Secondary | ICD-10-CM | POA: Diagnosis not present

## 2023-01-06 DIAGNOSIS — Z7901 Long term (current) use of anticoagulants: Secondary | ICD-10-CM | POA: Insufficient documentation

## 2023-01-06 DIAGNOSIS — Z87891 Personal history of nicotine dependence: Secondary | ICD-10-CM | POA: Insufficient documentation

## 2023-01-06 DIAGNOSIS — K219 Gastro-esophageal reflux disease without esophagitis: Secondary | ICD-10-CM | POA: Insufficient documentation

## 2023-01-06 DIAGNOSIS — Z79899 Other long term (current) drug therapy: Secondary | ICD-10-CM | POA: Insufficient documentation

## 2023-01-06 DIAGNOSIS — Z7902 Long term (current) use of antithrombotics/antiplatelets: Secondary | ICD-10-CM | POA: Insufficient documentation

## 2023-01-06 HISTORY — PX: COLONOSCOPY WITH PROPOFOL: SHX5780

## 2023-01-06 SURGERY — COLONOSCOPY WITH PROPOFOL
Anesthesia: General

## 2023-01-06 MED ORDER — LACTATED RINGERS IV SOLN: INTRAVENOUS | Status: DC

## 2023-01-06 MED ORDER — EPHEDRINE 5 MG/ML INJ
INTRAVENOUS | Status: AC
  Filled 2023-01-06: qty 5

## 2023-01-06 MED ORDER — STERILE WATER FOR IRRIGATION IR SOLN
Status: DC | PRN
  Administered 2023-01-06: 120 mL

## 2023-01-06 MED ORDER — PROPOFOL 500 MG/50ML IV EMUL
INTRAVENOUS | Status: AC
  Filled 2023-01-06: qty 50

## 2023-01-06 MED ORDER — EPHEDRINE SULFATE-NACL 50-0.9 MG/10ML-% IV SOSY
PREFILLED_SYRINGE | INTRAVENOUS | Status: DC | PRN
Start: 2023-01-06 — End: 2023-01-06
  Administered 2023-01-06: 10 mg via INTRAVENOUS

## 2023-01-06 MED ORDER — PROPOFOL 10 MG/ML IV BOLUS
INTRAVENOUS | Status: DC | PRN
  Administered 2023-01-06 (×3): 50 mg via INTRAVENOUS
  Administered 2023-01-06: 100 mg via INTRAVENOUS

## 2023-01-06 NOTE — Anesthesia Postprocedure Evaluation (Signed)
Anesthesia Post Note  Patient: Programmer, applications  Procedure(s) Performed: COLONOSCOPY WITH PROPOFOL  Patient location during evaluation: PACU Anesthesia Type: General Level of consciousness: awake and alert Pain management: pain level controlled Vital Signs Assessment: post-procedure vital signs reviewed and stable Respiratory status: spontaneous breathing, nonlabored ventilation, respiratory function stable and patient connected to nasal cannula oxygen Cardiovascular status: blood pressure returned to baseline and stable Postop Assessment: no apparent nausea or vomiting Anesthetic complications: no   There were no known notable events for this encounter.   Last Vitals:  Vitals:   01/06/23 1313 01/06/23 1314  BP: (!) 98/37 (!) 103/59  Pulse: 73   Resp: 14   Temp: 36.7 C   SpO2: 98%     Last Pain:  Vitals:   01/06/23 1313  TempSrc: Axillary  PainSc: 0-No pain                 Jamarquis Crull L Sy Saintjean

## 2023-01-06 NOTE — Op Note (Signed)
[facility] Patient Name: Megan Velasquez Procedure Date: 01/06/2023 11:45 AM MRN: [id number] Date of Birth: 23-Feb-1961 Attending MD: Gennette Pac , MD, [id number] CSN: [id number] Age: 62 Admit Type: Outpatient Procedure:                Colonoscopy Indications:              High risk colon cancer surveillance: Personal                            history of colonic polyps Providers:                Gennette Pac, MD, Edrick Kins, RN,                            Zena Amos Referring MD:             Gennette Pac, MD Medicines:                Propofol per Anesthesia Complications:            No immediate complications. Estimated Blood Loss:     Estimated blood loss: none. Procedure:                Pre-Anesthesia Assessment:                           - Prior to the procedure, a History and Physical                            was performed, and patient medications and                            allergies were reviewed. The patient's tolerance of                            previous anesthesia was also reviewed. The risks                            and benefits of the procedure and the sedation                            options and risks were discussed with the patient.                            All questions were answered, and informed consent                            was obtained. Prior Anticoagulants: The patient has                            taken no anticoagulant or antiplatelet agents. ASA                            Grade Assessment: III - A patient with severe  systemic disease. After reviewing the risks and                            benefits, the patient was deemed in satisfactory                            condition to undergo the procedure.                           After obtaining informed consent, the colonoscope                            was passed under direct vision. Throughout the                             procedure, the patient's blood pressure, pulse, and                            oxygen saturations were monitored continuously. The                            [id number] scope was introduced through the                            anus and advanced to the the cecum, identified by                            appendiceal orifice and ileocecal valve. Scope In: 12:52:25 PM Scope Out: 1:03:49 PM Scope Withdrawal Time: 0 hours 6 minutes 27 seconds  Total Procedure Duration: 0 hours 11 minutes 24 seconds  Findings:      The perianal and digital rectal examinations were normal.      Scattered small-mouthed diverticula were found in the sigmoid colon and       descending colon.      The exam was otherwise without abnormality on direct and retroflexion       views. Impression:               - Diverticulosis in the sigmoid colon and in the                            descending colon.                           - The examination was otherwise normal on direct                            and retroflexion views.                           - No specimens collected. Moderate Sedation:      Moderate (conscious) sedation was personally administered by an       anesthesia professional. The following parameters were monitored: oxygen       saturation, heart rate, blood pressure, respiratory rate, EKG, adequacy       of pulmonary ventilation, and response to care. Recommendation:           -  Patient has a contact number available for                            emergencies. The signs and symptoms of potential                            delayed complications were discussed with the                            patient. Return to normal activities tomorrow.                            Written discharge instructions were provided to the                            patient.                           - Advance diet as tolerated.                           - Continue present medications.                            - Repeat colonoscopy in 7 years for surveillance.                           - Return to GI office (date not yet determined). Procedure Code(s):        --- Professional ---                           [phone], Colonoscopy, flexible; diagnostic, including                            collection of specimen(s) by brushing or washing,                            when performed (separate procedure) Diagnosis Code(s):        --- Professional ---                           Z86.010, Personal history of colonic polyps                           K57.30, Diverticulosis of large intestine without                            perforation or abscess without bleeding CPT copyright 2022 American Medical Association. All rights reserved. The codes documented in this report are preliminary and upon coder review may  be revised to meet current compliance requirements. Gerrit Friends. Jalesa Thien, MD Gennette Pac, MD 01/06/2023 1:13:04 PM This report has been signed electronically. Number of Addenda: 0

## 2023-01-06 NOTE — Discharge Instructions (Addendum)
  Colonoscopy Discharge Instructions  Read the instructions outlined below and refer to this sheet in the next few weeks. These discharge instructions provide you with general information on caring for yourself after you leave the hospital. Your doctor may also give you specific instructions. While your treatment has been planned according to the most current medical practices available, unavoidable complications occasionally occur. If you have any problems or questions after discharge, call Dr. Jena Gauss at [phone]. ACTIVITY You may resume your regular activity, but move at a slower pace for the next 24 hours.  Take frequent rest periods for the next 24 hours.  Walking will help get rid of the air and reduce the bloated feeling in your belly (abdomen).  No driving for 24 hours (because of the medicine (anesthesia) used during the test).   Do not sign any important legal documents or operate any machinery for 24 hours (because of the anesthesia used during the test).  NUTRITION Drink plenty of fluids.  You may resume your normal diet as instructed by your doctor.  Begin with a light meal and progress to your normal diet. Heavy or fried foods are harder to digest and may make you feel sick to your stomach (nauseated).  Avoid alcoholic beverages for 24 hours or as instructed.  MEDICATIONS You may resume your normal medications unless your doctor tells you otherwise.  WHAT YOU CAN EXPECT TODAY Some feelings of bloating in the abdomen.  Passage of more gas than usual.  Spotting of blood in your stool or on the toilet paper.  IF YOU HAD POLYPS REMOVED DURING THE COLONOSCOPY: No aspirin products for 7 days or as instructed.  No alcohol for 7 days or as instructed.  Eat a soft diet for the next 24 hours.  FINDING OUT THE RESULTS OF YOUR TEST Not all test results are available during your visit. If your test results are not back during the visit, make an appointment with your caregiver to find out the  results. Do not assume everything is normal if you have not heard from your caregiver or the medical facility. It is important for you to follow up on all of your test results.  SEEK IMMEDIATE MEDICAL ATTENTION IF: You have more than a spotting of blood in your stool.  Your belly is swollen (abdominal distention).  You are nauseated or vomiting.  You have a temperature over 101.  You have abdominal pain or discomfort that is severe or gets worse throughout the day.       No polyps found today.  You do have diverticulosis.  Informational diverticulosis provided   it is recommended you return for repeat colonoscopy in 7 years   at patient request, I called Alecia Lemming at [facility] findings and recommendations   resume Eliquis today

## 2023-01-06 NOTE — Transfer of Care (Signed)
Immediate Anesthesia Transfer of Care Note  Patient: Megan Velasquez  Procedure(s) Performed: COLONOSCOPY WITH PROPOFOL  Patient Location: Endoscopy Unit  Anesthesia Type:General  Level of Consciousness: awake, alert , and oriented  Airway & Oxygen Therapy: Patient Spontanous Breathing  Post-op Assessment: Report given to RN and Post -op Vital signs reviewed and stable  Post vital signs: Reviewed and stable  Last Vitals:  Vitals Value Taken Time  BP    Temp    Pulse    Resp    SpO2      Last Pain:  Vitals:   01/06/23 1245  TempSrc:   PainSc: 0-No pain         Complications: No notable events documented.

## 2023-01-06 NOTE — Anesthesia Preprocedure Evaluation (Signed)
Anesthesia Evaluation  Patient identified by MRN, date of birth, ID band Patient awake    Reviewed: Allergy & Precautions, H&P , NPO status , Patient's Chart, lab work & pertinent test results, reviewed documented beta blocker date and time   Airway Mallampati: II  TM Distance: >3 FB Neck ROM: full    Dental no notable dental hx.    Pulmonary neg pulmonary ROS, former smoker   Pulmonary exam normal breath sounds clear to auscultation       Cardiovascular Exercise Tolerance: Good hypertension, negative cardio ROS Normal cardiovascular exam Rhythm:regular Rate:Normal     Neuro/Psych negative neurological ROS  negative psych ROS   GI/Hepatic negative GI ROS, Neg liver ROS,GERD  ,,  Endo/Other  negative endocrine ROS    Renal/GU negative Renal ROS  negative genitourinary   Musculoskeletal   Abdominal   Peds  Hematology negative hematology ROS (+)   Anesthesia Other Findings 12 lead with NSR, nonspecific T changes Otherwise denies any sig PMH  Reproductive/Obstetrics negative OB ROS                             Anesthesia Physical Anesthesia Plan  ASA: 2  Anesthesia Plan: General   Post-op Pain Management: Minimal or no pain anticipated   Induction: Intravenous  PONV Risk Score and Plan: Propofol infusion  Airway Management Planned: Nasal Cannula and Natural Airway  Additional Equipment: None  Intra-op Plan:   Post-operative Plan:   Informed Consent: I have reviewed the patients History and Physical, chart, labs and discussed the procedure including the risks, benefits and alternatives for the proposed anesthesia with the patient or authorized representative who has indicated his/her understanding and acceptance.     Dental Advisory Given  Plan Discussed with: CRNA and Anesthesiologist  Anesthesia Plan Comments:         Anesthesia Quick Evaluation

## 2023-01-06 NOTE — H&P (Signed)
@LOGO @   Primary Care Physician:  Delorse Lek, FNP Primary Gastroenterologist:  Dr. Jena Gauss  Pre-Procedure History & Physical: HPI:  Megan Velasquez is a 61 y.o. female here for   For surveillance colonoscopy.  History of multiple colonic adenomas removed over time.    Eliquis held 3 days ago.  Past Medical History:  Diagnosis Date   Cancer (HCC)    GERD (gastroesophageal reflux disease)    History of kidney stones    HTN (hypertension)    Multiple myeloma (HCC)    PAD (peripheral artery disease) (HCC)    Renal disorder     Past Surgical History:  Procedure Laterality Date   ABDOMINAL AORTOGRAM W/LOWER EXTREMITY N/A 12/24/2021   Procedure: ABDOMINAL AORTOGRAM W/LOWER EXTREMITY;  Surgeon: Victorino Sparrow, MD;  Location: [facility] INVASIVE CV LAB;  Service: Cardiovascular;  Laterality: N/A;   CHOLECYSTECTOMY     COLONOSCOPY  01/2015   Dr. Teena Dunk: Mild diverticulosis, sessile polyp ranging 3 to 5 mm removed from the proximal transverse colon, semi-pedunculated polyp 5 to 9 mm in size removed from the sigmoid colon.  Sigmoid colon polyp was serrated adenoma, transverse colon polyp was adenomatous.  Patient was told to have another colonoscopy in 3 years.   COLONOSCOPY WITH PROPOFOL N/A 10/11/2017   Procedure: COLONOSCOPY WITH PROPOFOL;  Surgeon: Corbin Ade, MD;  Location: [facility];  Service: Endoscopy;  Laterality: N/A;  1:15pm   IR IMAGING GUIDED PORT INSERTION  06/05/2020   PERIPHERAL VASCULAR INTERVENTION  12/24/2021   Procedure: PERIPHERAL VASCULAR INTERVENTION;  Surgeon: Victorino Sparrow, MD;  Location: [facility] INVASIVE CV LAB;  Service: Cardiovascular;;   POLYPECTOMY  10/11/2017   Procedure: POLYPECTOMY;  Surgeon: Corbin Ade, MD;  Location: [facility];  Service: Endoscopy;;  descending colon polyps cs times 2    Prior to Admission medications   Medication Sig Start Date End Date Taking? Authorizing Provider  acetaminophen (TYLENOL) 500 MG tablet Take 1,000 mg by mouth every 6  (six) hours as needed for fever or headache.   Yes [provider]  Ascorbic Acid (VITAMIN C) 1000 MG tablet Take 1,000 mg by mouth every morning.   Yes [provider]  b complex vitamins capsule Take 1 capsule by mouth daily. Patient taking differently: Take 1 capsule by mouth every morning. 04/25/20  Yes Johney Maine, MD  Cholecalciferol (VITAMIN D3) 5000 units CAPS Take 5,000 Units by mouth every morning.   Yes [provider]  clopidogrel (PLAVIX) 75 MG tablet Take 1 tablet by mouth once daily 11/17/22  Yes Victorino Sparrow, MD  Cyanocobalamin (VITAMIN B12) 3000 MCG SUBL Place 3,000 mcg under the tongue every morning.   Yes [provider]  famotidine (PEPCID) 20 MG tablet Take 1 tablet (20 mg total) by mouth at bedtime. 10/23/22  Yes Letta Median, PA-C  lisinopril-hydrochlorothiazide (ZESTORETIC) 10-12.5 MG tablet Take 1 tablet by mouth daily. 12/22/21  Yes [provider]  loratadine (CLARITIN) 10 MG tablet Take 10 mg by mouth every morning.   Yes [provider]  omeprazole (PRILOSEC) 40 MG capsule Take 40 mg by mouth daily as needed (acid reflux). 01/31/20  Yes [provider]  ondansetron (ZOFRAN) 8 MG tablet Take 1 tablet (8 mg total) by mouth 2 (two) times daily as needed (Nausea or vomiting). 10/05/22  Yes Johney Maine, MD  REVLIMID 10 MG capsule TAKE 1 CAPSULE BY MOUTH DAILY FOR 21 DAYS, THEN TAKE 7 DAYS OFF Patient taking  differently: TAKE 1 CAPSULE BY MOUTH DAILY FOR 21 DAYS, THEN TAKE 7 DAYS OFF 01/01/23  Yes Johney Maine, MD  rosuvastatin (CRESTOR) 10 MG tablet Take 10 mg by mouth daily. 09/18/21  Yes [provider]  ADVAIR HFA 230-21 MCG/ACT inhaler Inhale 2 puffs into the lungs 2 (two) times daily. Patient not taking: Reported on 10/23/2022 03/24/21   [provider]  ELIQUIS 2.5 MG TABS tablet Take 1 tablet by mouth twice daily 12/22/22   Johney Maine, MD    Allergies  as of 12/21/2022   (No Known Allergies)    Family History  Problem Relation Age of Onset   Heart disease Mother    Diabetes Mother    Lung cancer Father    COPD Father    Colon cancer Neg Hx     Social History   Socioeconomic History   Marital status: Married    Spouse name: Not on file   Number of children: Not on file   Years of education: Not on file   Highest education level: Not on file  Occupational History   Not on file  Tobacco Use   Smoking status: Former    Current packs/day: 0.00    Average packs/day: 1 pack/day for 28.0 years (28.0 ttl pk-yrs)    Types: Cigarettes    Start date: 10/08/1985    Quit date: 10/08/2013    Years since quitting: 9.2   Smokeless tobacco: Never  Vaping Use   Vaping status: Never Used  Substance and Sexual Activity   Alcohol use: Not Currently   Drug use: Never   Sexual activity: Yes    Birth control/protection: Post-menopausal  Other Topics Concern   Not on file  Social History Narrative   Not on file   Social Determinants of Health   Financial Resource Strain: Low Risk  (09/13/2020)   [facility], [facility]   Overall Financial Resource Strain (CARDIA)    Difficulty of Paying Living Expenses: Not hard at all  Food Insecurity: No Food Insecurity (09/13/2020)   [facility], [facility]   Hunger Vital Sign    Worried About Running Out of Food in the Last Year: Never true    Ran Out of Food in the Last Year: Never true  Transportation Needs: No Transportation Needs (09/13/2020)   Received from [facility], [facility]   [facility] - Transportation    Lack of Transportation (Medical): No    Lack of Transportation (Non-Medical): No  Physical Activity: Inactive (12/22/2022)   Received from [facility]   Exercise Vital Sign    Days of Exercise per Week: 0 days    Minutes of Exercise per Session: 0 min  Stress: No Stress Concern Present (12/22/2022)   Received from [facility] - Occupational Stress Questionnaire    Feeling of Stress : Not at all  Social Connections: Unknown (06/06/2020)   Social Connection and Isolation Panel [NHANES]    Frequency of Communication with Friends and Family: More than three times a week    Frequency of Social Gatherings with Friends and Family: More than three times a week    Attends Religious Services: Not on file    Active Member of Clubs or Organizations: Not on file    Attends Banker Meetings: Not on file    Marital Status: Married  Intimate Partner Violence: Not At  Risk (12/22/2022)   Received from [facility]   Humiliation, Afraid, Rape, and Kick questionnaire    Fear of Current or Ex-Partner: No    Emotionally Abused: No    Physically Abused: No    Sexually Abused: No    Review of Systems: See HPI, otherwise negative ROS  Physical Exam: BP 110/73   Pulse 77   Temp 97.9 F (36.6 C) (Oral)   Resp 13   Ht 5\' 2"  (1.575 m)   Wt 59.7 kg   SpO2 100%   BMI 24.07 kg/m  General:   Alert,  Well-developed, well-nourished, pleasant and cooperative in NAD Skin:  Intact without significant lesions or rashes. Heart:  Regular rate and rhythm; no murmurs, clicks, rubs,  or gallops. Abdomen: Non-distended, normal bowel sounds.  Soft and nontender without appreciable mass or hepatosplenomegaly.  Pulses:  Normal pulses noted. Extremities:  Without clubbing or edema.  Impression/Plan:    62 year old lady with a history of multiple colonic adenomas removed over time here for surveillance colonoscopy.  Eliquis held 3 days ago.  I will offer the patient a surveillance colonoscopy today.  The risks, benefits, limitations, alternatives and imponderables have been reviewed with the patient. Questions have been answered. All parties are agreeable.       Notice: This dictation was prepared with Dragon dictation along with smaller phrase technology. Any transcriptional  errors that result from this process are unintentional and may not be corrected upon review.

## 2023-01-12 ENCOUNTER — Other Ambulatory Visit: Payer: Self-pay

## 2023-01-12 DIAGNOSIS — C9001 Multiple myeloma in remission: Secondary | ICD-10-CM

## 2023-01-13 ENCOUNTER — Inpatient Hospital Stay: Payer: BC Managed Care – PPO | Admitting: Hematology

## 2023-01-13 ENCOUNTER — Inpatient Hospital Stay: Payer: BC Managed Care – PPO | Attending: Hematology

## 2023-01-13 VITALS — BP 141/68 | HR 71 | Temp 97.7°F | Resp 16 | Wt 136.8 lb

## 2023-01-13 DIAGNOSIS — Z87891 Personal history of nicotine dependence: Secondary | ICD-10-CM | POA: Insufficient documentation

## 2023-01-13 DIAGNOSIS — C9001 Multiple myeloma in remission: Secondary | ICD-10-CM

## 2023-01-13 DIAGNOSIS — E860 Dehydration: Secondary | ICD-10-CM | POA: Diagnosis not present

## 2023-01-13 DIAGNOSIS — C9000 Multiple myeloma not having achieved remission: Secondary | ICD-10-CM | POA: Insufficient documentation

## 2023-01-13 DIAGNOSIS — Z95828 Presence of other vascular implants and grafts: Secondary | ICD-10-CM

## 2023-01-13 LAB — CMP (CANCER CENTER ONLY)
ALT: 14 U/L (ref 0–44)
AST: 16 U/L (ref 15–41)
Albumin: 4.3 g/dL (ref 3.5–5.0)
Alkaline Phosphatase: 67 U/L (ref 38–126)
Anion gap: 7 (ref 5–15)
BUN: 26 mg/dL — ABNORMAL HIGH (ref 8–23)
CO2: 25 mmol/L (ref 22–32)
Calcium: 9.3 mg/dL (ref 8.9–10.3)
Chloride: 106 mmol/L (ref 98–111)
Creatinine: 1.1 mg/dL — ABNORMAL HIGH (ref 0.44–1.00)
GFR, Estimated: 57 mL/min — ABNORMAL LOW (ref >60)
Glucose, Bld: 134 mg/dL — ABNORMAL HIGH (ref 70–99)
Potassium: 3.7 mmol/L (ref 3.5–5.1)
Sodium: 138 mmol/L (ref 135–145)
Total Bilirubin: 0.7 mg/dL (ref 0.3–1.2)
Total Protein: 6.8 g/dL (ref 6.5–8.1)

## 2023-01-13 LAB — CBC WITH DIFFERENTIAL (CANCER CENTER ONLY)
Abs Immature Granulocytes: 0.03 10*3/uL (ref 0.00–0.07)
Basophils Absolute: 0.1 10*3/uL (ref 0.0–0.1)
Basophils Relative: 1 %
Eosinophils Absolute: 0 10*3/uL (ref 0.0–0.5)
Eosinophils Relative: 1 %
HCT: 39.2 % (ref 36.0–46.0)
Hemoglobin: 12.8 g/dL (ref 12.0–15.0)
Immature Granulocytes: 1 %
Lymphocytes Relative: 25 %
Lymphs Abs: 0.9 10*3/uL (ref 0.7–4.0)
MCH: 31.6 pg (ref 26.0–34.0)
MCHC: 32.7 g/dL (ref 30.0–36.0)
MCV: 96.8 fL (ref 80.0–100.0)
Monocytes Absolute: 0.3 10*3/uL (ref 0.1–1.0)
Monocytes Relative: 9 %
Neutro Abs: 2.3 10*3/uL (ref 1.7–7.7)
Neutrophils Relative %: 63 %
Platelet Count: 154 10*3/uL (ref 150–400)
RBC: 4.05 MIL/uL (ref 3.87–5.11)
RDW: 14 % (ref 11.5–15.5)
WBC Count: 3.6 10*3/uL — ABNORMAL LOW (ref 4.0–10.5)
nRBC: 0 % (ref 0.0–0.2)

## 2023-01-13 MED ORDER — HEPARIN SOD (PORK) LOCK FLUSH 100 UNIT/ML IV SOLN
500.0000 [IU] | Freq: Once | INTRAVENOUS | Status: AC
  Administered 2023-01-13: 500 [IU]

## 2023-01-13 MED ORDER — SODIUM CHLORIDE 0.9% FLUSH
10.0000 mL | Freq: Once | INTRAVENOUS | Status: AC
  Administered 2023-01-13: 10 mL

## 2023-01-13 NOTE — Progress Notes (Signed)
HEMATOLOGY/ONCOLOGY CLINIC NOTE  Date of Service: 01/13/2023  Patient Care Team: Delorse Lek, FNP as PCP - General (Family Medicine) Wyline Mood, Dorothe Pea, MD as PCP - Cardiology (Cardiology) Delorse Lek, FNP as PCP - Family Medicine (Family Medicine) Jena Gauss, Gerrit Friends, MD as Consulting Physician (Gastroenterology)  CHIEF COMPLAINTS/PURPOSE OF CONSULTATION:  Continued follow-up for evaluation and management of multiple myeloma  HISTORY OF PRESENTING ILLNESS:  Please see previous note for details on initial presentation  INTERVAL HISTORY  Megan Velasquez is a 62 y.o. female is here for continued evaluation and management of her multiple myeloma. Patient was last seen by me on 11/12/2022 and reported occasional diarrhea, wart on right index finger, and upcoming eye procedure for removal of condensation behind the lens.   She was admitted on 01/06/2023 for surveillance colonoscopy which showed diverticulosis in the sigmoid colon and in the descending colon. The examination was otherwise normal on direct and retroflexion views.  Today, she is accompanied by her husband. Patient received a pap smear, colonoscopy, and mammorgram recently. She has recently completed eye surgery to remove condensation behind the lens.   Patient complains of pain in her left elbow radiating down her arm. Recent x-ray showed no issues in the bone and no fluid or obvious inflammation in the joint. She does report that she was thought to have tendonitis. Patient denies any known specific triggers. She denies engaging in any specific activities or any falls/trauma that may have caused symptoms. Patient denies any new bone pain in the spine, hip, or shoulder.   Patient has tolerated Revlimid well with no new or severe toxicities. She denies any significant diarrhea, muscle cramps, rashes, or infection issues.   She continues to take Eliquis and Plavix regularly. Patient reports having a large bruise in her left arm  from an IV line from recent colonoscopy.  She reports that she previously presented to the ED due to severe acid reflux symptoms. Patient was given liquid acid suppressant while there which improved her symptoms. She is unsure of what triggers her acid reflux, but notes that it is well managed at this time. Patient regularly takes Omeprazole in the mornings and Famotidine at night.   She has gained 5 pounds in the last 10 days and currently weighs 136 pounds. Patient reports that her A1c was okay recently. She tries her best to stay well-hydrated.   MEDICAL HISTORY:  Past Medical History:  Diagnosis Date   Cancer (HCC)    GERD (gastroesophageal reflux disease)    History of kidney stones    HTN (hypertension)    Multiple myeloma (HCC)    PAD (peripheral artery disease) (HCC)    Renal disorder     SURGICAL HISTORY: Past Surgical History:  Procedure Laterality Date   ABDOMINAL AORTOGRAM W/LOWER EXTREMITY N/A 12/24/2021   Procedure: ABDOMINAL AORTOGRAM W/LOWER EXTREMITY;  Surgeon: Victorino Sparrow, MD;  Location: [facility] INVASIVE CV LAB;  Service: Cardiovascular;  Laterality: N/A;   CHOLECYSTECTOMY     COLONOSCOPY  01/2015   Dr. Teena Dunk: Mild diverticulosis, sessile polyp ranging 3 to 5 mm removed from the proximal transverse colon, semi-pedunculated polyp 5 to 9 mm in size removed from the sigmoid colon.  Sigmoid colon polyp was serrated adenoma, transverse colon polyp was adenomatous.  Patient was told to have another colonoscopy in 3 years.   COLONOSCOPY WITH PROPOFOL N/A 10/11/2017   Procedure: COLONOSCOPY WITH PROPOFOL;  Surgeon: Corbin Ade, MD;  [facility];  Service: Endoscopy;  Laterality: N/A;  1:15pm   IR IMAGING GUIDED PORT INSERTION  06/05/2020   PERIPHERAL VASCULAR INTERVENTION  12/24/2021   Procedure: PERIPHERAL VASCULAR INTERVENTION;  Surgeon: Victorino Sparrow, MD;  Location: [facility] INVASIVE CV LAB;  Service: Cardiovascular;;   POLYPECTOMY  10/11/2017   Procedure:  POLYPECTOMY;  Surgeon: Corbin Ade, MD;  [facility]: [facility];  Service: Endoscopy;;  descending colon polyps cs times 2    SOCIAL HISTORY: Social History   Socioeconomic History   Marital status: Married    Spouse name: Not on file   Number of children: Not on file   Years of education: Not on file   Highest education level: Not on file  Occupational History   Not on file  Tobacco Use   Smoking status: Former    Current packs/day: 0.00    Average packs/day: 1 pack/day for 28.0 years (28.0 ttl pk-yrs)    Types: Cigarettes    Start date: 10/08/1985    Quit date: 10/08/2013    Years since quitting: 9.2   Smokeless tobacco: Never  Vaping Use   Vaping status: Never Used  Substance and Sexual Activity   Alcohol use: Not Currently   Drug use: Never   Sexual activity: Yes    Birth control/protection: Post-menopausal  Other Topics Concern   Not on file  Social History Narrative   Not on file   Social Determinants of Health   Financial Resource Strain: Low Risk  (09/13/2020)   Received from [facility], [facility]   Overall Financial Resource Strain (CARDIA)    Difficulty of Paying Living Expenses: Not hard at all  Food Insecurity: No Food Insecurity (09/13/2020)   Received from [facility], [facility]   Hunger Vital Sign    Worried About Running Out of Food in the Last Year: Never true    Ran Out of Food in the Last Year: Never true  Transportation Needs: No Transportation Needs (09/13/2020)   Received from [facility], [facility]   [facility] - Transportation    Lack of Transportation (Medical): No    Lack of Transportation (Non-Medical): No  Physical Activity: Inactive (12/22/2022)   Received from [facility]   Exercise Vital Sign    Days of Exercise per Week: 0 days    Minutes of Exercise per Session: 0 min  Stress: No Stress Concern Present (12/22/2022)   Received from [facility] of Occupational Health -  Occupational Stress Questionnaire    Feeling of Stress : Not at all  Social Connections: Unknown (06/06/2020)   Social Connection and Isolation Panel [NHANES]    Frequency of Communication with Friends and Family: More than three times a week    Frequency of Social Gatherings with Friends and Family: More than three times a week    Attends Religious Services: Not on file    Active Member of Clubs or Organizations: Not on file    Attends Banker Meetings: Not on file    Marital Status: Married  Intimate Partner Violence: Not At Risk (12/22/2022)   Received from [facility]   Humiliation, Afraid, Rape, and Kick questionnaire    Fear of Current or Ex-Partner: No    Emotionally Abused: No    Physically Abused: No    Sexually Abused: No    FAMILY HISTORY: Family History  Problem Relation Age of Onset   Heart disease Mother    Diabetes Mother  Lung cancer Father    COPD Father    Colon cancer Neg Hx     ALLERGIES:  has No Known Allergies.  MEDICATIONS:  Current Outpatient Medications  Medication Sig Dispense Refill   acetaminophen (TYLENOL) 500 MG tablet Take 1,000 mg by mouth every 6 (six) hours as needed for fever or headache.     ADVAIR HFA 230-21 MCG/ACT inhaler Inhale 2 puffs into the lungs 2 (two) times daily. (Patient not taking: Reported on 10/23/2022)     Ascorbic Acid (VITAMIN C) 1000 MG tablet Take 1,000 mg by mouth every morning.     b complex vitamins capsule Take 1 capsule by mouth daily. (Patient taking differently: Take 1 capsule by mouth every morning.) 30 capsule 3   Cholecalciferol (VITAMIN D3) 5000 units CAPS Take 5,000 Units by mouth every morning.     clopidogrel (PLAVIX) 75 MG tablet Take 1 tablet by mouth once daily 30 tablet 11   Cyanocobalamin (VITAMIN B12) 3000 MCG SUBL Place 3,000 mcg under the tongue every morning.     ELIQUIS 2.5 MG TABS tablet Take 1 tablet by mouth twice daily 60 tablet 0   famotidine (PEPCID) 20 MG tablet Take 1  tablet (20 mg total) by mouth at bedtime. 30 tablet 3   lisinopril-hydrochlorothiazide (ZESTORETIC) 10-12.5 MG tablet Take 1 tablet by mouth daily.     loratadine (CLARITIN) 10 MG tablet Take 10 mg by mouth every morning.     omeprazole (PRILOSEC) 40 MG capsule Take 40 mg by mouth daily as needed (acid reflux).     ondansetron (ZOFRAN) 8 MG tablet Take 1 tablet (8 mg total) by mouth 2 (two) times daily as needed (Nausea or vomiting). 30 tablet 1   REVLIMID 10 MG capsule TAKE 1 CAPSULE BY MOUTH DAILY FOR 21 DAYS, THEN TAKE 7 DAYS OFF (Patient taking differently: TAKE 1 CAPSULE BY MOUTH DAILY FOR 21 DAYS, THEN TAKE 7 DAYS OFF) 21 capsule 0   rosuvastatin (CRESTOR) 10 MG tablet Take 10 mg by mouth daily.     No current facility-administered medications for this visit.    REVIEW OF SYSTEMS:    10 Point review of Systems was done is negative except as noted above.   PHYSICAL EXAMINATION:  ECOG PERFORMANCE STATUS: 1 - Symptomatic but completely ambulatory .BP (!) 141/68 (BP Location: Right Arm) Comment: Nurse Beth stated to me that it was ok  Pulse 71   Temp 97.7 F (36.5 C) (Oral)   Resp 16   Wt 136 lb 12.8 oz (62.1 kg)   SpO2 100%   BMI 25.02 kg/m   GENERAL:alert, in no acute distress and comfortable SKIN: no acute rashes, no significant lesions EYES: conjunctiva are pink and non-injected, sclera anicteric OROPHARYNX: MMM, no exudates, no oropharyngeal erythema or ulceration NECK: supple, no JVD LYMPH:  no palpable lymphadenopathy in the cervical, axillary or inguinal regions LUNGS: clear to auscultation b/l with normal respiratory effort HEART: regular rate & rhythm ABDOMEN:  normoactive bowel sounds , non tender, not distended. Extremity: no pedal edema PSYCH: alert & oriented x 3 with fluent speech NEURO: no focal motor/sensory deficits   LABORATORY DATA:  I have reviewed the data as listed  .    Latest Ref Rng & Units 11/12/2022   10:37 AM 09/02/2022    9:55 AM 06/24/2022    10:10 AM  CBC  WBC 4.0 - 10.5 K/uL 3.2  4.8  5.4   Hemoglobin 12.0 - 15.0 g/dL 81.1  91.4  12.8  Hematocrit 36.0 - 46.0 % 36.7  36.2  37.2   Platelets 150 - 400 K/uL 150  168  236     .    Latest Ref Rng & Units 01/04/2023    1:23 PM 11/12/2022   10:37 AM 09/02/2022    9:55 AM  CMP  Glucose 70 - 99 mg/dL 86  92  97   BUN 8 - 23 mg/dL 23  22  24    Creatinine 0.44 - 1.00 mg/dL 6.26  9.48  5.46   Sodium 135 - 145 mmol/L 139  137  137   Potassium 3.5 - 5.1 mmol/L 4.0  3.9  4.3   Chloride 98 - 111 mmol/L 102  103  102   CO2 22 - 32 mmol/L 28  27  28    Calcium 8.9 - 10.3 mg/dL 8.9  9.0  8.8   Total Protein 6.5 - 8.1 g/dL  6.6  6.6   Total Bilirubin 0.3 - 1.2 mg/dL  0.7  0.5   Alkaline Phos 38 - 126 U/L  68  73   AST 15 - 41 U/L  20  17   ALT 0 - 44 U/L  18  17      Most recent lab results (03/15/2020) of CBC is as follows: all values are WNL except for RBC at 3.66, Hgb at 9.7, HCT at 31.8, MCHC at 30.5, RDW Standard Deviation 58.7, Red Cell Distribution Width At 18.6,  BUN at 19, Creatinine at 1.06, Total Protein at 10.2, Albumin at 2.7, AST at 14,. 03/13/2020 UPEP shows no M-Spike, all values are WNL 03/06/2020 SPEP shows Total Protein at 10.1, Beta Globulin at 5.2, M-Spike at 4.3, Total Globulin at 6.4, A/G Ratio at 0.6 03/06/2020 Free Kappa Light Chains at 181.1, Free Lambda Light Chains Serum at 3.6, K/L ratio at 50.31 03/06/2020 Beta-2 Microglobulin at 2.5           RADIOGRAPHIC STUDIES: I have personally reviewed the radiological images as listed and agreed with the findings in the report. No results found.  ASSESSMENT & PLAN:   62 year old female here for continued follow-up, evaluation and management of multiple myeloma  #1  IgA kappa multiple myeloma status post induction treatment with Carfilzomib Revlimid dexamethasone  presenting with anemia hemoglobin 9.4. Creatinine 1.28 No hypercalcemia Bone survey with no obvious skeletal lesions. Baseline M spike of  4.3 g/dL. Beta-2 Microglobulin at 2.5 Mol Cy Monosomy 13 and dup 1q  2) status post stem cell collection assisted with G-CSF and Plerixafor. Currently on maintenance Revlimid. Currently had declined proceeding with consolidative autologous HSCT  3) Diarrhea - ? Related to Revlimid.  Mild and nearly resolved GI panel and c diff neg  PLAN:  -Discussed lab results on 01/13/23 in detail with patient. CBC normal, showed WBC of 3.6K, hemoglobin of 12.8, and platelets of 154K. -CMP stable, mild dehydration -last myeloma panel from 2 months ago showed that she continued to be in remission -myeloma lab from today show no measurable M spike but [place] with faint IgA band-- will need to be monitored. -Patient reports no significant toxicities from her current dose of Revlimid at this time.  -Patient has no symptom suggestive of disease progression  -continue 10 mg Revlimid for maintenance. -recommend limiting mobilization of left arm and avoid lifting heavy objects -recommend patient to avoid drinking too much liquids with main meals to improve acid reflux symptoms -recommend patient to drink at least 2L of water daily -discussed details of guidelines of  maintenance Revlimid -recommend patient to stay UTD with age-appropriate vaccines, including flu and COVID-19 vaccines -patient is UTD with age-appropriate cancer-screenings, including colonoscopy, papsmear, and mammogram -answered all of patient's and her husband's questions in detail  FOLLOW-UP: RTC with Dr Candise Che with labs in 3 months   The total time spent in the appointment was 30 minutes* .  All of the patient's questions were answered with apparent satisfaction. The patient knows to call the clinic with any problems, questions or concerns.   Wyvonnia Lora MD MS AAHIVMS [facility] Hematology/Oncology Physician [facility]  .*Total Encounter Time as defined by the Centers for Medicare and Medicaid Services includes, in addition  to the face-to-face time of a patient visit (documented in the note above) non-face-to-face time: obtaining and reviewing outside history, ordering and reviewing medications, tests or procedures, care coordination (communications with other health care professionals or caregivers) and documentation in the medical record.    I,Mitra Faeizi,acting as a Neurosurgeon for Wyvonnia Lora, MD.,have documented all relevant documentation on the behalf of Wyvonnia Lora, MD,as directed by  Wyvonnia Lora, MD while in the presence of Wyvonnia Lora, MD.  .I have reviewed the above documentation for accuracy and completeness, and I agree with the above. Johney Maine MD

## 2023-01-15 ENCOUNTER — Encounter (HOSPITAL_COMMUNITY): Payer: Self-pay | Admitting: Internal Medicine

## 2023-01-15 LAB — MULTIPLE MYELOMA PANEL, SERUM
Albumin SerPl Elph-Mcnc: 4 g/dL (ref 2.9–4.4)
Albumin/Glob SerPl: 1.7 (ref 0.7–1.7)
Alpha 1: 0.2 g/dL (ref 0.0–0.4)
Alpha2 Glob SerPl Elph-Mcnc: 0.7 g/dL (ref 0.4–1.0)
B-Globulin SerPl Elph-Mcnc: 0.9 g/dL (ref 0.7–1.3)
Gamma Glob SerPl Elph-Mcnc: 0.6 g/dL (ref 0.4–1.8)
Globulin, Total: 2.4 g/dL (ref 2.2–3.9)
IgA: 128 mg/dL (ref 87–352)
IgG (Immunoglobin G), Serum: 701 mg/dL (ref 586–1602)
IgM (Immunoglobulin M), Srm: 21 mg/dL — ABNORMAL LOW (ref 26–217)
Total Protein ELP: 6.4 g/dL (ref 6.0–8.5)

## 2023-01-19 ENCOUNTER — Encounter: Payer: Self-pay | Admitting: Hematology

## 2023-01-25 ENCOUNTER — Other Ambulatory Visit: Payer: Self-pay | Admitting: Hematology

## 2023-01-25 DIAGNOSIS — C9000 Multiple myeloma not having achieved remission: Secondary | ICD-10-CM

## 2023-01-27 ENCOUNTER — Encounter: Payer: Self-pay | Admitting: Hematology

## 2023-01-27 ENCOUNTER — Other Ambulatory Visit: Payer: Self-pay

## 2023-01-28 NOTE — Progress Notes (Signed)
Referring Provider: Delorse Lek, FNP Primary Care Physician:  Delorse Lek, FNP Primary GI Physician: Dr. Jena Gauss  Chief Complaint  Patient presents with   Follow-up    Follow up. No problem     HPI:   Megan Velasquez is a 62 y.o. female with multiple myeloma, PAD s/p stenting on Plavix and Eliquis, HTN, HLD, GERD, adenomatous colon polyps, presenting today for follow-up of diarrhea and nausea.  Last seen in the office 10/23/2022.  She was scheduled for surveillance colonoscopy.  She reported 1 year history of intermittent diarrhea occurring every couple of weeks and resolving after 3 to 4 days.  Associated abdominal cramping prior to a bowel movement that resolved thereafter.  However, she had had persistent diarrhea for the last 1.5 weeks with 4-6 watery bowel movements daily.  She had been on antibiotics about 1 month prior.  Discussed stool testing to rule out infection, but patient requested to hold off as she felt diarrhea was starting to improve over the last couple of days with Activia yogurt.  Regarding her chronic symptoms, suspected IBS versus symptoms related to Revlimid.  She was currently managing with Imodium as needed which worked well for her.  She also reported 1 month of morning nausea.  Query nocturnal GERD.  Recommended starting famotidine at bedtime.   Colonoscopy 01/06/2023 with diverticulosis in the sigmoid and descending colon, otherwise normal exam.  Recommended repeat colonoscopy in 7 years due to history of polyps.  Today: Reports he is doing well overall.  Diarrhea has improved significantly, returned to baseline occurring every few weeks.  Responds very well to Imodium and is satisfied with her current regimen.  No BRBPR, melena, or significant abdominal pain.  Prior morning nausea has resolved with the addition of Pepcid at bedtime.  No daytime GERD symptoms.  She continue to take omeprazole as well.   Past Medical History:  Diagnosis Date   Cancer (HCC)     GERD (gastroesophageal reflux disease)    History of kidney stones    HTN (hypertension)    Multiple myeloma (HCC)    PAD (peripheral artery disease) (HCC)    Renal disorder     Past Surgical History:  Procedure Laterality Date   ABDOMINAL AORTOGRAM W/LOWER EXTREMITY N/A 12/24/2021   Procedure: ABDOMINAL AORTOGRAM W/LOWER EXTREMITY;  Surgeon: Victorino Sparrow, MD;  Location: [facility] INVASIVE CV LAB;  Service: Cardiovascular;  Laterality: N/A;   CHOLECYSTECTOMY     COLONOSCOPY  01/2015   Dr. Teena Dunk: Mild diverticulosis, sessile polyp ranging 3 to 5 mm removed from the proximal transverse colon, semi-pedunculated polyp 5 to 9 mm in size removed from the sigmoid colon.  Sigmoid colon polyp was serrated adenoma, transverse colon polyp was adenomatous.  Patient was told to have another colonoscopy in 3 years.   COLONOSCOPY WITH PROPOFOL N/A 10/11/2017   Procedure: COLONOSCOPY WITH PROPOFOL;  Surgeon: Corbin Ade, MD;  [facility];  Service: Endoscopy;  Laterality: N/A;  1:15pm   COLONOSCOPY WITH PROPOFOL N/A 01/06/2023   Procedure: COLONOSCOPY WITH PROPOFOL;  Surgeon: Corbin Ade, MD;  [facility];  Service: Endoscopy;  Laterality: N/A;  1:15pm, asa 3   IR IMAGING GUIDED PORT INSERTION  06/05/2020   PERIPHERAL VASCULAR INTERVENTION  12/24/2021   Procedure: PERIPHERAL VASCULAR INTERVENTION;  Surgeon: Victorino Sparrow, MD;  Location: [facility] INVASIVE CV LAB;  Service: Cardiovascular;;   POLYPECTOMY  10/11/2017   Procedure: POLYPECTOMY;  Surgeon: Corbin Ade, MD;  [facility];  Service: Endoscopy;;  descending colon polyps cs times 2    Current Outpatient Medications  Medication Sig Dispense Refill   acetaminophen (TYLENOL) 500 MG tablet Take 1,000 mg by mouth every 6 (six) hours as needed for fever or headache.     Ascorbic Acid (VITAMIN C) 1000 MG tablet Take 1,000 mg by mouth every morning.     b complex vitamins capsule Take 1 capsule by mouth daily. (Patient  taking differently: Take 1 capsule by mouth every morning.) 30 capsule 3   Cholecalciferol (VITAMIN D3) 5000 units CAPS Take 5,000 Units by mouth every morning.     clopidogrel (PLAVIX) 75 MG tablet Take 1 tablet by mouth once daily 30 tablet 11   Cyanocobalamin (VITAMIN B12) 3000 MCG SUBL Place 3,000 mcg under the tongue every morning.     ELIQUIS 2.5 MG TABS tablet Take 1 tablet by mouth twice daily 60 tablet 0   famotidine (PEPCID) 20 MG tablet Take 1 tablet (20 mg total) by mouth at bedtime. 30 tablet 3   lisinopril-hydrochlorothiazide (ZESTORETIC) 10-12.5 MG tablet Take 1 tablet by mouth daily.     loratadine (CLARITIN) 10 MG tablet Take 10 mg by mouth every morning.     omeprazole (PRILOSEC) 40 MG capsule Take 40 mg by mouth daily as needed (acid reflux).     ondansetron (ZOFRAN) 8 MG tablet Take 1 tablet (8 mg total) by mouth 2 (two) times daily as needed (Nausea or vomiting). 30 tablet 1   REVLIMID 10 MG capsule TAKE 1 CAPSULE BY MOUTH DAILY FOR 21 DAYS, THEN TAKE 7 DAYS OFF 21 capsule 0   rosuvastatin (CRESTOR) 10 MG tablet Take 10 mg by mouth daily.     ADVAIR HFA 230-21 MCG/ACT inhaler Inhale 2 puffs into the lungs 2 (two) times daily. (Patient not taking: Reported on 10/23/2022)     No current facility-administered medications for this visit.    Allergies as of 01/29/2023   (No Known Allergies)    Family History  Problem Relation Age of Onset   Heart disease Mother    Diabetes Mother    Lung cancer Father    COPD Father    Colon cancer Neg Hx     Social History   Socioeconomic History   Marital status: Married    Spouse name: Not on file   Number of children: Not on file   Years of education: Not on file   Highest education level: Not on file  Occupational History   Not on file  Tobacco Use   Smoking status: Former    Current packs/day: 0.00    Average packs/day: 1 pack/day for 28.0 years (28.0 ttl pk-yrs)    Types: Cigarettes    Start date: 10/08/1985    Quit  date: 10/08/2013    Years since quitting: 9.3   Smokeless tobacco: Never  Vaping Use   Vaping status: Never Used  Substance and Sexual Activity   Alcohol use: Not Currently   Drug use: Never   Sexual activity: Yes    Birth control/protection: Post-menopausal  Other Topics Concern   Not on file  Social History Narrative   Not on file   Social Determinants of Health   Financial Resource Strain: Low Risk  (09/13/2020)   Received from [facility], [facility]   Overall Financial Resource Strain (CARDIA)    Difficulty of Paying Living Expenses: Not hard at all  Food Insecurity: No Food Insecurity (09/13/2020)  Received from [facility], [facility]   Hunger Vital Sign    Worried About Running Out of Food in the Last Year: Never true    Ran Out of Food in the Last Year: Never true  Transportation Needs: No Transportation Needs (09/13/2020)   Received from [facility], [facility]   [facility] - Transportation    Lack of Transportation (Medical): No    Lack of Transportation (Non-Medical): No  Physical Activity: Inactive (12/22/2022)   Received from [facility]   Exercise Vital Sign    Days of Exercise per Week: 0 days    Minutes of Exercise per Session: 0 min  Stress: No Stress Concern Present (12/22/2022)   Received from [facility] of Occupational Health - Occupational Stress Questionnaire    Feeling of Stress : Not at all  Social Connections: Unknown (06/06/2020)   Social Connection and Isolation Panel [NHANES]    Frequency of Communication with Friends and Family: More than three times a week    Frequency of Social Gatherings with Friends and Family: More than three times a week    Attends Religious Services: Not on Marketing executive or Organizations: Not on file    Attends Banker Meetings: Not on file    Marital Status: Married    Review of Systems: Gen: Denies fever, chills, cold or flulike  symptoms, presyncope, syncope. GI: See HPI Heme: See HPI  Physical Exam: BP 138/76 (BP Location: Right Arm, Patient Position: Sitting, Cuff Size: Normal)   Pulse 76   Temp 97.9 F (36.6 C) (Temporal)   Ht 5\' 5"  (1.651 m)   Wt 135 lb 6.4 oz (61.4 kg)   BMI 22.53 kg/m  General:   Alert and oriented. No distress noted. Pleasant and cooperative.  Head:  Normocephalic and atraumatic. Eyes:  Conjuctiva clear without scleral icterus. Heart:  S1, S2 present without murmurs appreciated. Lungs:  Clear to auscultation bilaterally. No wheezes, rales, or rhonchi. No distress.  Abdomen:  +BS, soft, non-tender and non-distended. No rebound or guarding. No HSM or masses noted. Msk:  Symmetrical without gross deformities. Normal posture. Extremities:  Without edema. Neurologic:  Alert and  oriented x4 Psych:  Normal mood and affect.    Assessment:  62 y.o. female with multiple myeloma, PAD s/p stenting on Plavix and Eliquis, HTN, HLD, GERD, adenomatous colon polyps, presenting today for follow-up of diarrhea and nausea.    Chronic intermittent diarrhea:  Likely secondary to IBS and/or influenced by Revlimid.  Symptoms are well-controlled with Imodium as needed. C. difficile and GI pathogen panel last year were negative.  Recent colonoscopy in October with normal exam.  GERD:  Well-controlled on omeprazole 40 mg daily and famotidine 20 mg at bedtime.  Nausea:  Resolved with the addition of famotidine at bedtime.  Symptoms likely related to nocturnal GERD.  Plan:  Continue omeprazole 40 mg daily. Continue famotidine 20 mg at bedtime. Continue Imodium as needed Follow-up in 1 year or sooner if needed.   Ermalinda Memos, PA-C [facility] 01/29/2023

## 2023-01-29 ENCOUNTER — Encounter: Payer: Self-pay | Admitting: Gastroenterology

## 2023-01-29 ENCOUNTER — Ambulatory Visit (INDEPENDENT_AMBULATORY_CARE_PROVIDER_SITE_OTHER): Payer: BC Managed Care – PPO | Admitting: Gastroenterology

## 2023-01-29 VITALS — BP 138/76 | HR 76 | Temp 97.9°F | Ht 65.0 in | Wt 135.4 lb

## 2023-01-29 DIAGNOSIS — K529 Noninfective gastroenteritis and colitis, unspecified: Secondary | ICD-10-CM | POA: Diagnosis not present

## 2023-01-29 DIAGNOSIS — K219 Gastro-esophageal reflux disease without esophagitis: Secondary | ICD-10-CM

## 2023-01-29 DIAGNOSIS — R110 Nausea: Secondary | ICD-10-CM

## 2023-01-29 NOTE — Patient Instructions (Signed)
Continue omeprazole 40 mg daily and pepcid 20 mg at bedtime.   Continue imodium as needed for diarrhea.   We will see you back in 1 year or sooner if needed.   It was great to see you again today!   Ermalinda Memos, PA-C [facility]

## 2023-02-15 ENCOUNTER — Other Ambulatory Visit: Payer: Self-pay | Admitting: Gastroenterology

## 2023-02-15 DIAGNOSIS — R110 Nausea: Secondary | ICD-10-CM

## 2023-02-16 ENCOUNTER — Other Ambulatory Visit: Payer: Self-pay | Admitting: Hematology

## 2023-02-16 DIAGNOSIS — C9000 Multiple myeloma not having achieved remission: Secondary | ICD-10-CM

## 2023-02-19 ENCOUNTER — Encounter: Payer: Self-pay | Admitting: Hematology

## 2023-02-21 ENCOUNTER — Other Ambulatory Visit: Payer: Self-pay | Admitting: Hematology

## 2023-02-21 DIAGNOSIS — C9001 Multiple myeloma in remission: Secondary | ICD-10-CM

## 2023-02-22 ENCOUNTER — Encounter: Payer: Self-pay | Admitting: Hematology

## 2023-03-19 ENCOUNTER — Other Ambulatory Visit: Payer: Self-pay | Admitting: Hematology

## 2023-03-19 DIAGNOSIS — C9000 Multiple myeloma not having achieved remission: Secondary | ICD-10-CM

## 2023-03-23 ENCOUNTER — Encounter: Payer: Self-pay | Admitting: Hematology

## 2023-04-15 ENCOUNTER — Telehealth: Payer: Self-pay | Admitting: *Deleted

## 2023-04-15 ENCOUNTER — Other Ambulatory Visit: Payer: Self-pay | Admitting: Hematology

## 2023-04-15 ENCOUNTER — Other Ambulatory Visit: Payer: Self-pay

## 2023-04-15 DIAGNOSIS — C9001 Multiple myeloma in remission: Secondary | ICD-10-CM

## 2023-04-15 DIAGNOSIS — C9000 Multiple myeloma not having achieved remission: Secondary | ICD-10-CM

## 2023-04-15 NOTE — Progress Notes (Signed)
 HEMATOLOGY/ONCOLOGY CLINIC NOTE  Date of Service: 04/16/2023  Patient Care Team: Almeda Loa ORN, FNP as PCP - General (Family Medicine) Alvan, Dorn FALCON, MD as PCP - Cardiology (Cardiology) Almeda Loa ORN, FNP as PCP - Family Medicine (Family Medicine) Shaaron, Lamar HERO, MD as Consulting Physician (Gastroenterology)  CHIEF COMPLAINTS/PURPOSE OF CONSULTATION:  Continued follow-up for evaluation and management of multiple myeloma  HISTORY OF PRESENTING ILLNESS:  Please see previous note for details on initial presentation  INTERVAL HISTORY  Megan Velasquez is a 63 y.o. female is here for continued evaluation and management of her multiple myeloma. Patient was last seen by me on 01/13/2023 and complained of pain in her left elbow radiating down her arm, large bruise in her left arm from an IV line from colonoscopy, and severe acid reflux which eventually improved. She also reported a 5-pound weight gain over 10 days.   Today, she is accompanied by her husband. She reports being told that her creatinine was recently high and that she has CKD. However, her creatine today is WNL at 0.99 which is not concerning.   Patient reports that she endorsed mild dizziness with lightheaded sensation yesterday. She notes that she previously felt dizzy at the end of June, which improved after taking nausea medication. She reports that she has improved her water  intake.   She reports endorsing bone pain sometimes in her left elbow. Patient reports that she was told to have tendonitis and X-ray did not show concerning findings. Patient denies any other new bone pains.   Patient has tolerated Revlimid  well with no new or severe toxicities. She denies any major medication issues and denies any infection issues.   She reports diarrhea sometimes usually in the mornings. She generally takes one tablet of imodium sometimes which does improve symptoms. Patient notes that she ocasionallly needs to take a second  tablet.   MEDICAL HISTORY:  Past Medical History:  Diagnosis Date   Cancer (HCC)    GERD (gastroesophageal reflux disease)    History of kidney stones    HTN (hypertension)    Multiple myeloma (HCC)    PAD (peripheral artery disease) (HCC)    Renal disorder     SURGICAL HISTORY: Past Surgical History:  Procedure Laterality Date   ABDOMINAL AORTOGRAM W/LOWER EXTREMITY N/A 12/24/2021   Procedure: ABDOMINAL AORTOGRAM W/LOWER EXTREMITY;  Surgeon: Lanis Fonda BRAVO, MD;  Location: [facility] INVASIVE CV LAB;  Service: Cardiovascular;  Laterality: N/A;   CHOLECYSTECTOMY     COLONOSCOPY  01/2015   Dr. Donnel: Mild diverticulosis, sessile polyp ranging 3 to 5 mm removed from the proximal transverse colon, semi-pedunculated polyp 5 to 9 mm in size removed from the sigmoid colon.  Sigmoid colon polyp was serrated adenoma, transverse colon polyp was adenomatous.  Patient was told to have another colonoscopy in 3 years.   COLONOSCOPY WITH PROPOFOL  N/A 10/11/2017   Procedure: COLONOSCOPY WITH PROPOFOL ;  Surgeon: Shaaron Lamar HERO, MD;  Location: [facility];  Service: Endoscopy;  Laterality: N/A;  1:15pm   COLONOSCOPY WITH PROPOFOL  N/A 01/06/2023   Procedure: COLONOSCOPY WITH PROPOFOL ;  Surgeon: Shaaron Lamar HERO, MD;  Location: [facility];  Service: Endoscopy;  Laterality: N/A;  1:15pm, asa 3   IR IMAGING GUIDED PORT INSERTION  06/05/2020   PERIPHERAL VASCULAR INTERVENTION  12/24/2021   Procedure: PERIPHERAL VASCULAR INTERVENTION;  Surgeon: Lanis Fonda BRAVO, MD;  Location: [facility] INVASIVE CV LAB;  Service: Cardiovascular;;   POLYPECTOMY  10/11/2017   Procedure: POLYPECTOMY;  Surgeon:  Rourk, Lamar HERO, MD;  Location: [facility];  Service: Endoscopy;;  descending colon polyps cs times 2    SOCIAL HISTORY: Social History   Socioeconomic History   Marital status: Married    Spouse name: Not on file   Number of children: Not on file   Years of education: Not on file   Highest education level: Not on file   Occupational History   Not on file  Tobacco Use   Smoking status: Former    Current packs/day: 0.00    Average packs/day: 1 pack/day for 28.0 years (28.0 ttl pk-yrs)    Types: Cigarettes    Start date: 10/08/1985    Quit date: 10/08/2013    Years since quitting: 9.5   Smokeless tobacco: Never  Vaping Use   Vaping status: Never Used  Substance and Sexual Activity   Alcohol use: Not Currently   Drug use: Never   Sexual activity: Yes    Birth control/protection: Post-menopausal  Other Topics Concern   Not on file  Social History Narrative   Not on file   Social Drivers of Health   Financial Resource Strain: Low Risk  (09/13/2020)   Received from [facility], [facility]   Overall Financial Resource Strain (CARDIA)    Difficulty of Paying Living Expenses: Not hard at all  Food Insecurity: No Food Insecurity (09/13/2020)   Received from [facility], [facility]   Hunger Vital Sign    Worried About Running Out of Food in the Last Year: Never true    Ran Out of Food in the Last Year: Never true  Transportation Needs: No Transportation Needs (09/13/2020)   Received from [facility], [facility]   [facility] - Transportation    Lack of Transportation (Medical): No    Lack of Transportation (Non-Medical): No  Physical Activity: Inactive (12/22/2022)   Received from [facility]   Exercise Vital Sign    Days of Exercise per Week: 0 days    Minutes of Exercise per Session: 0 min  Stress: No Stress Concern Present (12/22/2022)   Received from [facility] - Occupational Stress Questionnaire    Feeling of Stress : Not at all  Social Connections: Unknown (06/06/2020)   Social Connection and Isolation Panel [NHANES]    Frequency of Communication with Friends and Family: More than three times a week    Frequency of Social Gatherings with Friends and Family: More than three times a week    Attends Religious Services: Not  on file    Active Member of Clubs or Organizations: Not on file    Attends Banker Meetings: Not on file    Marital Status: Married  Intimate Partner Violence: Not At Risk (12/22/2022)   Received from [facility]   Humiliation, Afraid, Rape, and Kick questionnaire    Fear of Current or Ex-Partner: No    Emotionally Abused: No    Physically Abused: No    Sexually Abused: No    FAMILY HISTORY: Family History  Problem Relation Age of Onset   Heart disease Mother    Diabetes Mother    Lung cancer Father    COPD Father    Colon cancer Neg Hx     ALLERGIES:  has no known allergies.  MEDICATIONS:  Current Outpatient Medications  Medication Sig Dispense Refill   acetaminophen  (TYLENOL ) 500 MG tablet Take 1,000 mg by mouth every  6 (six) hours as needed for fever or headache.     ADVAIR HFA 230-21 MCG/ACT inhaler Inhale 2 puffs into the lungs 2 (two) times daily. (Patient not taking: Reported on 10/23/2022)     Ascorbic Acid  (VITAMIN C) 1000 MG tablet Take 1,000 mg by mouth every morning.     b complex vitamins capsule Take 1 capsule by mouth daily. (Patient taking differently: Take 1 capsule by mouth every morning.) 30 capsule 3   Cholecalciferol  (VITAMIN D3) 5000 units CAPS Take 5,000 Units by mouth every morning.     clopidogrel  (PLAVIX ) 75 MG tablet Take 1 tablet by mouth once daily 30 tablet 11   Cyanocobalamin  (VITAMIN B12) 3000 MCG SUBL Place 3,000 mcg under the tongue every morning.     ELIQUIS  2.5 MG TABS tablet Take 1 tablet by mouth twice daily 60 tablet 0   famotidine  (PEPCID ) 20 MG tablet TAKE 1 TABLET BY MOUTH AT BEDTIME 30 tablet 11   lisinopril -hydrochlorothiazide  (ZESTORETIC ) 10-12.5 MG tablet Take 1 tablet by mouth daily.     loratadine  (CLARITIN ) 10 MG tablet Take 10 mg by mouth every morning.     omeprazole (PRILOSEC) 40 MG capsule Take 40 mg by mouth daily as needed (acid reflux).     ondansetron  (ZOFRAN ) 8 MG tablet Take 1 tablet (8 mg total) by  mouth 2 (two) times daily as needed (Nausea or vomiting). 30 tablet 1   REVLIMID  10 MG capsule TAKE 1 CAPSULE BY MOUTH DAILY FOR 21 DAYS, THEN TAKE 7 DAYS OFF 21 capsule 0   rosuvastatin (CRESTOR) 10 MG tablet Take 10 mg by mouth daily.     No current facility-administered medications for this visit.    REVIEW OF SYSTEMS:    10 Point review of Systems was done is negative except as noted above.   PHYSICAL EXAMINATION:  ECOG PERFORMANCE STATUS: 1 - Symptomatic but completely ambulatory .BP 130/70 (BP Location: Right Leg, Patient Position: Sitting)   Pulse 67   Temp (!) 97.2 F (36.2 C) (Temporal)   Resp 16   Ht 5' 5 (1.651 m)   Wt 133 lb 12.8 oz (60.7 kg)   SpO2 100%   BMI 22.27 kg/m   GENERAL:alert, in no acute distress and comfortable SKIN: no acute rashes, no significant lesions EYES: conjunctiva are pink and non-injected, sclera anicteric OROPHARYNX: MMM, no exudates, no oropharyngeal erythema or ulceration NECK: supple, no JVD LYMPH:  no palpable lymphadenopathy in the cervical, axillary or inguinal regions LUNGS: clear to auscultation b/l with normal respiratory effort HEART: regular rate & rhythm ABDOMEN:  normoactive bowel sounds , non tender, not distended. Extremity: no pedal edema PSYCH: alert & oriented x 3 with fluent speech NEURO: no focal motor/sensory deficits   LABORATORY DATA:  I have reviewed the data as listed  .    Latest Ref Rng & Units 04/16/2023    9:38 AM 01/13/2023    9:14 AM 11/12/2022   10:37 AM  CBC  WBC 4.0 - 10.5 K/uL 3.6  3.6  3.2   Hemoglobin 12.0 - 15.0 g/dL 87.0  87.1  87.2   Hematocrit 36.0 - 46.0 % 37.8  39.2  36.7   Platelets 150 - 400 K/uL 131  154  150     .    Latest Ref Rng & Units 04/16/2023    9:38 AM 01/13/2023    9:14 AM 01/04/2023    1:23 PM  CMP  Glucose 70 - 99 mg/dL 94  865  86  BUN 8 - 23 mg/dL 17  26  23    Creatinine 0.44 - 1.00 mg/dL 9.00  8.89  8.86   Sodium 135 - 145 mmol/L 137  138  139   Potassium 3.5  - 5.1 mmol/L 4.1  3.7  4.0   Chloride 98 - 111 mmol/L 104  106  102   CO2 22 - 32 mmol/L 27  25  28    Calcium 8.9 - 10.3 mg/dL 9.0  9.3  8.9   Total Protein 6.5 - 8.1 g/dL 6.4  6.8    Total Bilirubin 0.0 - 1.2 mg/dL 0.8  0.7    Alkaline Phos 38 - 126 U/L 72  67    AST 15 - 41 U/L 17  16    ALT 0 - 44 U/L 17  14       Most recent lab results (03/15/2020) of CBC is as follows: all values are WNL except for RBC at 3.66, Hgb at 9.7, HCT at 31.8, MCHC at 30.5, RDW Standard Deviation 58.7, Red Cell Distribution Width At 18.6,  BUN at 19, Creatinine at 1.06, Total Protein at 10.2, Albumin at 2.7, AST at 14,. 03/13/2020 UPEP shows no M-Spike, all values are WNL 03/06/2020 SPEP shows Total Protein at 10.1, Beta Globulin at 5.2, M-Spike at 4.3, Total Globulin at 6.4, A/G Ratio at 0.6 03/06/2020 Free Kappa Light Chains at 181.1, Free Lambda Light Chains Serum at 3.6, K/L ratio at 50.31 03/06/2020 Beta-2  Microglobulin at 2.5           RADIOGRAPHIC STUDIES: I have personally reviewed the radiological images as listed and agreed with the findings in the report. No results found.  ASSESSMENT & PLAN:   63 year old female here for continued follow-up, evaluation and management of multiple myeloma  #1  IgA kappa multiple myeloma status post induction treatment with Carfilzomib  Revlimid  dexamethasone   presenting with anemia hemoglobin 9.4. Creatinine 1.28 No hypercalcemia Bone survey with no obvious skeletal lesions. Baseline M spike of 4.3 g/dL. Beta-2  Microglobulin at 2.5 Mol Cy Monosomy 13 and dup 1q  2) status post stem cell collection assisted with G-CSF and Plerixafor. Currently on maintenance Revlimid . Currently had declined proceeding with consolidative autologous HSCT  3) Diarrhea - ? Related to Revlimid .  Mild and nearly resolved GI panel and c diff neg  PLAN:  -Discussed lab results on 04/16/2023 in detail with patient. CBC stable, showed WBC of 3.6K, hemoglobin of 12.9, and  platelets of 131K. -Hgb normal -WBC is slightly low, likely due to Revlimid  and not concerning  -Neutrophils normal -Kidney function and calcium normal -her last myeloma panel from 01/13/2023 showed that her M protein continued to be undetectable -myeloma panel from today is pending at this time -CMP shows creatinine level of 0.99, which is not concerning  -educated patient that certain medications such as Lisinopril  and her diuretic medication can increase creatinine levels.  -advised patient to continue to avoid OTC pain medications that may affect creatinine levels. Also recommend continuing to control blood presssure well, and staying well hydrated to manage creatinine levels -continue to stay well hydrated with at least 2L of water  daily.  -patient has no clinical sign or lab evidence of disease progression at this time -Patient reports no significant toxicities from her current dose of Revlimid  at this time.  -continue 10 mg Revlimid  for maintenance.  -answered all of patient's questions in detail  FOLLOW-UP: RTC with Dr Onesimo with labs in 2 months   The total time spent in  the appointment was 25 minutes* .  All of the patient's questions were answered with apparent satisfaction. The patient knows to call the clinic with any problems, questions or concerns.   Emaline Saran MD MS AAHIVMS [facility] Hematology/Oncology Physician [facility]  .*Total Encounter Time as defined by the Centers for Medicare and Medicaid Services includes, in addition to the face-to-face time of a patient visit (documented in the note above) non-face-to-face time: obtaining and reviewing outside history, ordering and reviewing medications, tests or procedures, care coordination (communications with other health care professionals or caregivers) and documentation in the medical record.    I,Mitra Faeizi,acting as a neurosurgeon for Emaline Saran, MD.,have documented all relevant documentation on the behalf  of Emaline Saran, MD,as directed by  Emaline Saran, MD while in the presence of Emaline Saran, MD.  .I have reviewed the above documentation for accuracy and completeness, and I agree with the above. .Amelio Brosky Kishore Knox Holdman MD

## 2023-04-15 NOTE — Telephone Encounter (Signed)
 Spoke with patient who is in agreement with moving flush appt to 0930 on 1/10 to help with scheduling

## 2023-04-16 ENCOUNTER — Encounter: Payer: Self-pay | Admitting: Hematology

## 2023-04-16 ENCOUNTER — Inpatient Hospital Stay: Payer: BC Managed Care – PPO | Attending: Hematology | Admitting: Hematology

## 2023-04-16 ENCOUNTER — Inpatient Hospital Stay: Payer: BC Managed Care – PPO

## 2023-04-16 ENCOUNTER — Other Ambulatory Visit: Payer: BC Managed Care – PPO

## 2023-04-16 VITALS — BP 130/70 | HR 67 | Temp 97.2°F | Resp 16 | Ht 65.0 in | Wt 133.8 lb

## 2023-04-16 DIAGNOSIS — C9000 Multiple myeloma not having achieved remission: Secondary | ICD-10-CM | POA: Insufficient documentation

## 2023-04-16 DIAGNOSIS — D649 Anemia, unspecified: Secondary | ICD-10-CM | POA: Insufficient documentation

## 2023-04-16 DIAGNOSIS — C9001 Multiple myeloma in remission: Secondary | ICD-10-CM | POA: Diagnosis not present

## 2023-04-16 DIAGNOSIS — Z95828 Presence of other vascular implants and grafts: Secondary | ICD-10-CM

## 2023-04-16 DIAGNOSIS — Z87891 Personal history of nicotine dependence: Secondary | ICD-10-CM | POA: Diagnosis not present

## 2023-04-16 LAB — CMP (CANCER CENTER ONLY)
ALT: 17 U/L (ref 0–44)
AST: 17 U/L (ref 15–41)
Albumin: 4.2 g/dL (ref 3.5–5.0)
Alkaline Phosphatase: 72 U/L (ref 38–126)
Anion gap: 6 (ref 5–15)
BUN: 17 mg/dL (ref 8–23)
CO2: 27 mmol/L (ref 22–32)
Calcium: 9 mg/dL (ref 8.9–10.3)
Chloride: 104 mmol/L (ref 98–111)
Creatinine: 0.99 mg/dL (ref 0.44–1.00)
GFR, Estimated: >60 mL/min (ref >60)
Glucose, Bld: 94 mg/dL (ref 70–99)
Potassium: 4.1 mmol/L (ref 3.5–5.1)
Sodium: 137 mmol/L (ref 135–145)
Total Bilirubin: 0.8 mg/dL (ref 0.0–1.2)
Total Protein: 6.4 g/dL — ABNORMAL LOW (ref 6.5–8.1)

## 2023-04-16 LAB — CBC WITH DIFFERENTIAL (CANCER CENTER ONLY)
Abs Immature Granulocytes: 0.01 10*3/uL (ref 0.00–0.07)
Basophils Absolute: 0 10*3/uL (ref 0.0–0.1)
Basophils Relative: 1 %
Eosinophils Absolute: 0.2 10*3/uL (ref 0.0–0.5)
Eosinophils Relative: 5 %
HCT: 37.8 % (ref 36.0–46.0)
Hemoglobin: 12.9 g/dL (ref 12.0–15.0)
Immature Granulocytes: 0 %
Lymphocytes Relative: 26 %
Lymphs Abs: 1 10*3/uL (ref 0.7–4.0)
MCH: 31.6 pg (ref 26.0–34.0)
MCHC: 34.1 g/dL (ref 30.0–36.0)
MCV: 92.6 fL (ref 80.0–100.0)
Monocytes Absolute: 0.5 10*3/uL (ref 0.1–1.0)
Monocytes Relative: 14 %
Neutro Abs: 1.9 10*3/uL (ref 1.7–7.7)
Neutrophils Relative %: 54 %
Platelet Count: 131 10*3/uL — ABNORMAL LOW (ref 150–400)
RBC: 4.08 MIL/uL (ref 3.87–5.11)
RDW: 13.3 % (ref 11.5–15.5)
WBC Count: 3.6 10*3/uL — ABNORMAL LOW (ref 4.0–10.5)
nRBC: 0 % (ref 0.0–0.2)

## 2023-04-16 MED ORDER — SODIUM CHLORIDE 0.9% FLUSH
10.0000 mL | Freq: Once | INTRAVENOUS | Status: AC
  Administered 2023-04-16: 10 mL

## 2023-04-16 MED ORDER — HEPARIN SOD (PORK) LOCK FLUSH 100 UNIT/ML IV SOLN
500.0000 [IU] | Freq: Once | INTRAVENOUS | Status: AC
  Administered 2023-04-16: 500 [IU]

## 2023-04-19 ENCOUNTER — Encounter: Payer: Self-pay | Admitting: Hematology

## 2023-04-22 ENCOUNTER — Encounter: Payer: Self-pay | Admitting: Hematology

## 2023-04-23 LAB — MULTIPLE MYELOMA PANEL, SERUM
Albumin SerPl Elph-Mcnc: 3.8 g/dL (ref 2.9–4.4)
Albumin/Glob SerPl: 1.7 (ref 0.7–1.7)
Alpha 1: 0.2 g/dL (ref 0.0–0.4)
Alpha2 Glob SerPl Elph-Mcnc: 0.6 g/dL (ref 0.4–1.0)
B-Globulin SerPl Elph-Mcnc: 0.8 g/dL (ref 0.7–1.3)
Gamma Glob SerPl Elph-Mcnc: 0.6 g/dL (ref 0.4–1.8)
Globulin, Total: 2.3 g/dL (ref 2.2–3.9)
IgA: 135 mg/dL (ref 87–352)
IgG (Immunoglobin G), Serum: 690 mg/dL (ref 586–1602)
IgM (Immunoglobulin M), Srm: 17 mg/dL — ABNORMAL LOW (ref 26–217)
Total Protein ELP: 6.1 g/dL (ref 6.0–8.5)

## 2023-04-30 ENCOUNTER — Ambulatory Visit: Payer: BC Managed Care – PPO | Admitting: Vascular Surgery

## 2023-04-30 ENCOUNTER — Encounter (HOSPITAL_COMMUNITY): Payer: BC Managed Care – PPO

## 2023-05-05 NOTE — Progress Notes (Unsigned)
HISTORY AND PHYSICAL     CC:  follow up. Requesting Provider:  Delorse Lek, FNP  HPI: This is a 63 y.o. female who is here today for follow up for PAD.  Pt has hx of aortogram with right CIA stent on 12/24/2021 by Dr. Karin Lieu for RLE atheroembolic event.   She has hx of multiple myeloma, treated with Revlimid, both of which lead to hypercoagulable state.  She is on eliquis.    Pt was last seen 01/30/2022 and at that time, she had palpable DP pulse bilatearlly.  She was scheduled for 9 month follow up.   The pt returns today for follow up.  She states she is doing well.  She denies any claudication, rest pain or non healing wounds.  She states she does get some cramping at night.  She quit smoking about 9 years ago.  She is compliant with her plavix and Eliquis and Crestor.  She states that her blood pressure normally runs 120's-130's systolic when she is not at the doctor.    The pt is on a statin for cholesterol management.    The pt is not on an aspirin.    Other AC:  Plavix/Eliquis The pt is on ACEI, diuretic for hypertension.  The pt is not on medication for diabetes. Tobacco hx:  former  Pt does have family hx of AAA with a grandfather.    Past Medical History:  Diagnosis Date   Cancer (HCC)    GERD (gastroesophageal reflux disease)    History of kidney stones    HTN (hypertension)    Multiple myeloma (HCC)    PAD (peripheral artery disease) (HCC)    Renal disorder     Past Surgical History:  Procedure Laterality Date   ABDOMINAL AORTOGRAM W/LOWER EXTREMITY N/A 12/24/2021   Procedure: ABDOMINAL AORTOGRAM W/LOWER EXTREMITY;  Surgeon: Victorino Sparrow, MD;  Location: [facility] INVASIVE CV LAB;  Service: Cardiovascular;  Laterality: N/A;   CHOLECYSTECTOMY     COLONOSCOPY  01/2015   Dr. Teena Dunk: Mild diverticulosis, sessile polyp ranging 3 to 5 mm removed from the proximal transverse colon, semi-pedunculated polyp 5 to 9 mm in size removed from the sigmoid colon.  Sigmoid colon  polyp was serrated adenoma, transverse colon polyp was adenomatous.  Patient was told to have another colonoscopy in 3 years.   COLONOSCOPY WITH PROPOFOL N/A 10/11/2017   Procedure: COLONOSCOPY WITH PROPOFOL;  Surgeon: Corbin Ade, MD;  Location: [facility];  Service: Endoscopy;  Laterality: N/A;  1:15pm   COLONOSCOPY WITH PROPOFOL N/A 01/06/2023   Procedure: COLONOSCOPY WITH PROPOFOL;  Surgeon: Corbin Ade, MD;  Location: [facility];  Service: Endoscopy;  Laterality: N/A;  1:15pm, asa 3   IR IMAGING GUIDED PORT INSERTION  06/05/2020   PERIPHERAL VASCULAR INTERVENTION  12/24/2021   Procedure: PERIPHERAL VASCULAR INTERVENTION;  Surgeon: Victorino Sparrow, MD;  Location: [facility] INVASIVE CV LAB;  Service: Cardiovascular;;   POLYPECTOMY  10/11/2017   Procedure: POLYPECTOMY;  Surgeon: Corbin Ade, MD;  Location: [facility];  Service: Endoscopy;;  descending colon polyps cs times 2    No Known Allergies  Current Outpatient Medications  Medication Sig Dispense Refill   acetaminophen (TYLENOL) 500 MG tablet Take 1,000 mg by mouth every 6 (six) hours as needed for fever or headache.     ADVAIR HFA 230-21 MCG/ACT inhaler Inhale 2 puffs into the lungs 2 (two) times daily. (Patient not taking: Reported on 10/23/2022)     Ascorbic  Acid (VITAMIN C) 1000 MG tablet Take 1,000 mg by mouth every morning.     b complex vitamins capsule Take 1 capsule by mouth daily. (Patient taking differently: Take 1 capsule by mouth every morning.) 30 capsule 3   Cholecalciferol (VITAMIN D3) 5000 units CAPS Take 5,000 Units by mouth every morning.     clopidogrel (PLAVIX) 75 MG tablet Take 1 tablet by mouth once daily 30 tablet 11   Cyanocobalamin (VITAMIN B12) 3000 MCG SUBL Place 3,000 mcg under the tongue every morning.     ELIQUIS 2.5 MG TABS tablet Take 1 tablet by mouth twice daily 60 tablet 0   famotidine (PEPCID) 20 MG tablet TAKE 1 TABLET BY MOUTH AT BEDTIME 30 tablet 11   lisinopril-hydrochlorothiazide  (ZESTORETIC) 10-12.5 MG tablet Take 1 tablet by mouth daily.     loratadine (CLARITIN) 10 MG tablet Take 10 mg by mouth every morning.     omeprazole (PRILOSEC) 40 MG capsule Take 40 mg by mouth daily as needed (acid reflux).     ondansetron (ZOFRAN) 8 MG tablet Take 1 tablet (8 mg total) by mouth 2 (two) times daily as needed (Nausea or vomiting). 30 tablet 1   REVLIMID 10 MG capsule TAKE 1 CAPSULE BY MOUTH DAILY FOR 21 DAYS, THEN TAKE 7 DAYS OFF 21 capsule 0   rosuvastatin (CRESTOR) 10 MG tablet Take 10 mg by mouth daily.     No current facility-administered medications for this visit.    Family History  Problem Relation Age of Onset   Heart disease Mother    Diabetes Mother    Lung cancer Father    COPD Father    Colon cancer Neg Hx     Social History   Socioeconomic History   Marital status: Married    Spouse name: Not on file   Number of children: Not on file   Years of education: Not on file   Highest education level: Not on file  Occupational History   Not on file  Tobacco Use   Smoking status: Former    Current packs/day: 0.00    Average packs/day: 1 pack/day for 28.0 years (28.0 ttl pk-yrs)    Types: Cigarettes    Start date: 10/08/1985    Quit date: 10/08/2013    Years since quitting: 9.5   Smokeless tobacco: Never  Vaping Use   Vaping status: Never Used  Substance and Sexual Activity   Alcohol use: Not Currently   Drug use: Never   Sexual activity: Yes    Birth control/protection: Post-menopausal  Other Topics Concern   Not on file  Social History Narrative   Not on file   Social Drivers of Health   Financial Resource Strain: Low Risk  (09/13/2020)   Received from [facility], [facility]   Overall Financial Resource Strain (CARDIA)    Difficulty of Paying Living Expenses: Not hard at all  Food Insecurity: No Food Insecurity (09/13/2020)   Received from [facility], [facility]   Hunger Vital Sign    Worried About Running Out of Food in  the Last Year: Never true    Ran Out of Food in the Last Year: Never true  Transportation Needs: No Transportation Needs (09/13/2020)   Received from [facility], [facility]   [facility] - Transportation    Lack of Transportation (Medical): No    Lack of Transportation (Non-Medical): No  Physical Activity: Inactive (12/22/2022)   Received from [facility]  Exercise Vital Sign    Days of Exercise per Week: 0 days    Minutes of Exercise per Session: 0 min  Stress: No Stress Concern Present (12/22/2022)   Received from [facility] - Occupational Stress Questionnaire    Feeling of Stress : Not at all  Social Connections: Unknown (06/06/2020)   Social Connection and Isolation Panel [NHANES]    Frequency of Communication with Friends and Family: More than three times a week    Frequency of Social Gatherings with Friends and Family: More than three times a week    Attends Religious Services: Not on file    Active Member of Clubs or Organizations: Not on file    Attends Banker Meetings: Not on file    Marital Status: Married  Intimate Partner Violence: Not At Risk (12/22/2022)   Received from [facility]   Humiliation, Afraid, Rape, and Kick questionnaire    Fear of Current or Ex-Partner: No    Emotionally Abused: No    Physically Abused: No    Sexually Abused: No     REVIEW OF SYSTEMS:   [X]  denotes positive finding, [ ]  denotes negative finding Cardiac  Comments:  Chest pain or chest pressure:    Shortness of breath upon exertion:    Short of breath when lying flat:    Irregular heart rhythm:        Vascular    Pain in calf, thigh, or hip brought on by ambulation:    Pain in feet at night that wakes you up from your sleep:     Blood clot in your veins:    Leg swelling:         Pulmonary    Oxygen at home:    Productive cough:     Wheezing:         Neurologic    Sudden weakness in arms or legs:      Sudden numbness in arms or legs:     Sudden onset of difficulty speaking or slurred speech:    Temporary loss of vision in one eye:     Problems with dizziness:         Gastrointestinal    Blood in stool:     Vomited blood:         Genitourinary    Burning when urinating:     Blood in urine:        Psychiatric    Major depression:         Hematologic    Bleeding problems:    Problems with blood clotting too easily:        Skin    Rashes or ulcers:        Constitutional    Fever or chills:      PHYSICAL EXAMINATION:  There were no vitals filed for this visit.  There is no height or weight on file to calculate BMI.   General:  WDWN in NAD; vital signs documented above Gait: Not observed HENT: WNL, normocephalic Pulmonary: normal non-labored breathing , without wheezing Cardiac: regular HR, without carotid bruits Abdomen: soft, NT; aortic pulse is palpable Skin: without rashes Vascular Exam/Pulses:  Right Left  Radial 2+ (normal) 2+ (normal)  Femoral 2+ (normal) 2+ (normal)  Popliteal Unable to palpate Unable to palpate  DP 2+ (normal) 2+ (normal)   Extremities: without ischemic changes, without Gangrene , without cellulitis; without open wounds Musculoskeletal: no muscle wasting or  atrophy  Neurologic: A&O X 3 Psychiatric:  The pt has Normal affect.   Non-Invasive Vascular Imaging:   ABI's/TBI's on 10/30/2022: Right:  1.27/0 - Great toe pressure: 0 Left:  1.04/0.73 - Great toe pressure: 109  Arterial duplex on 10/30/2022: Right Stent(s):  +---------------+--------+--------+---------+--------+  CIA           PSV cm/sStenosisWaveform Comments  +---------------+--------+--------+---------+--------+  Prox to Stent  92              biphasic           +---------------+--------+--------+---------+--------+  Proximal Stent 116             biphasic           +---------------+--------+--------+---------+--------+  Mid Stent      197              triphasic          +---------------+--------+--------+---------+--------+  Distal Stent   127             biphasic           +---------------+--------+--------+---------+--------+  Distal to Stent204     50-74%  biphasic           +---------------+--------+--------+---------+--------+  Summary:  Stenosis: +------------------+-----------------------------+-----------+  Location          Stenosis                     Stent        +------------------+-----------------------------+-----------+  Right Common Iliac>50% stenosis (stent outflow)no stenosis  +------------------+-----------------------------+-----------+    Previous ABI's/TBI's on 01/23/2022: Right:  1.16/0.92 - Great toe pressure: 122 Left:  0.99/0.80 - Great toe pressure:  107  Previous arterial duplex on 01/23/2022: Right Stent(s):  +---------------+--------+--------+---------+--------+  CIA           PSV cm/sStenosisWaveform Comments  +---------------+--------+--------+---------+--------+  Proximal Stent 155             triphasic          +---------------+--------+--------+---------+--------+  Mid Stent      162             triphasic          +---------------+--------+--------+---------+--------+  Distal Stent   186             triphasic          +---------------+--------+--------+---------+--------+  Distal to Stent137             biphasic           +---------------+--------+--------+---------+--------+   Summary:  Stenosis: +------------------+-----------+  Location          Stent        +------------------+-----------+  Right Common Iliacno stenosis  +------------------+-----------+      ASSESSMENT/PLAN:: 63 y.o. female here for follow up for PAD with hx of aortogram with right CIA stent on 12/24/2021 by Dr. Karin Lieu for RLE atheroembolic event.   Hx right iliac artery stenting -pt with easily palpable femoral and DP pulses bilaterally -her duplex  shows a mild stenosis distal to the stent but she remains asymptomatic.   -continue plavix, statin, eliquis -pt will f/u in 6 months with right aortoiliac and ABI.  She will call sooner if any issues before then.   Family hx AAA -pt has family hx of AAA with her grandfather.  Her aorta is palpable on exam.   -she is a former smoker.  Given the above, will get a AAA screen when she returns for follow up in 6  months.  Discussed that if she develops new sudden severe abdominal pain, she should call 911.     Doreatha Massed, [facility] [phone]  Clinic MD:   Karin Lieu

## 2023-05-06 ENCOUNTER — Ambulatory Visit (INDEPENDENT_AMBULATORY_CARE_PROVIDER_SITE_OTHER)
Admission: RE | Admit: 2023-05-06 | Discharge: 2023-05-06 | Disposition: A | Discharge status: Home / Self Care | Payer: BC Managed Care – PPO | Source: Ambulatory Visit | Attending: Vascular Surgery | Admitting: Vascular Surgery

## 2023-05-06 ENCOUNTER — Ambulatory Visit: Payer: BC Managed Care – PPO | Admitting: Vascular Surgery

## 2023-05-06 ENCOUNTER — Ambulatory Visit (HOSPITAL_COMMUNITY)
Admission: RE | Admit: 2023-05-06 | Discharge: 2023-05-06 | Disposition: A | Discharge status: Home / Self Care | Payer: BC Managed Care – PPO | Source: Ambulatory Visit | Attending: Vascular Surgery | Admitting: Vascular Surgery

## 2023-05-06 ENCOUNTER — Encounter (HOSPITAL_COMMUNITY): Payer: Self-pay

## 2023-05-06 ENCOUNTER — Encounter: Payer: Self-pay | Admitting: Vascular Surgery

## 2023-05-06 VITALS — BP 120/83 | HR 75 | Temp 97.7°F | Resp 20 | Ht 65.0 in | Wt 131.0 lb

## 2023-05-06 DIAGNOSIS — Z95828 Presence of other vascular implants and grafts: Secondary | ICD-10-CM

## 2023-05-06 DIAGNOSIS — M79604 Pain in right leg: Secondary | ICD-10-CM | POA: Insufficient documentation

## 2023-05-06 DIAGNOSIS — M79605 Pain in left leg: Secondary | ICD-10-CM | POA: Diagnosis present

## 2023-05-06 DIAGNOSIS — I714 Abdominal aortic aneurysm, without rupture, unspecified: Secondary | ICD-10-CM | POA: Diagnosis present

## 2023-05-06 DIAGNOSIS — I75021 Atheroembolism of right lower extremity: Secondary | ICD-10-CM | POA: Diagnosis not present

## 2023-05-06 DIAGNOSIS — I739 Peripheral vascular disease, unspecified: Secondary | ICD-10-CM

## 2023-05-06 LAB — VAS US ABI WITH/WO TBI
Left ABI: 0.95
Right ABI: 1.1

## 2023-05-14 ENCOUNTER — Other Ambulatory Visit: Payer: Self-pay | Admitting: Hematology

## 2023-05-14 DIAGNOSIS — C9000 Multiple myeloma not having achieved remission: Secondary | ICD-10-CM

## 2023-05-17 ENCOUNTER — Encounter: Payer: Self-pay | Admitting: Hematology

## 2023-05-20 ENCOUNTER — Other Ambulatory Visit: Payer: Self-pay

## 2023-05-20 DIAGNOSIS — Z95828 Presence of other vascular implants and grafts: Secondary | ICD-10-CM

## 2023-05-20 DIAGNOSIS — I739 Peripheral vascular disease, unspecified: Secondary | ICD-10-CM

## 2023-06-08 ENCOUNTER — Other Ambulatory Visit: Payer: Self-pay

## 2023-06-08 DIAGNOSIS — C9001 Multiple myeloma in remission: Secondary | ICD-10-CM

## 2023-06-09 ENCOUNTER — Inpatient Hospital Stay: Payer: BC Managed Care – PPO | Admitting: Hematology

## 2023-06-09 ENCOUNTER — Inpatient Hospital Stay: Payer: BC Managed Care – PPO | Attending: Hematology

## 2023-06-09 VITALS — BP 136/91 | HR 93 | Temp 97.7°F | Resp 18 | Ht 65.0 in | Wt 133.6 lb

## 2023-06-09 DIAGNOSIS — C9000 Multiple myeloma not having achieved remission: Secondary | ICD-10-CM | POA: Insufficient documentation

## 2023-06-09 DIAGNOSIS — C9001 Multiple myeloma in remission: Secondary | ICD-10-CM

## 2023-06-09 DIAGNOSIS — Z87891 Personal history of nicotine dependence: Secondary | ICD-10-CM | POA: Insufficient documentation

## 2023-06-09 DIAGNOSIS — Z95828 Presence of other vascular implants and grafts: Secondary | ICD-10-CM

## 2023-06-09 LAB — CMP (CANCER CENTER ONLY)
ALT: 16 U/L (ref 0–44)
AST: 15 U/L (ref 15–41)
Albumin: 4.3 g/dL (ref 3.5–5.0)
Alkaline Phosphatase: 71 U/L (ref 38–126)
Anion gap: 9 (ref 5–15)
BUN: 18 mg/dL (ref 8–23)
CO2: 28 mmol/L (ref 22–32)
Calcium: 9 mg/dL (ref 8.9–10.3)
Chloride: 101 mmol/L (ref 98–111)
Creatinine: 1.08 mg/dL — ABNORMAL HIGH (ref 0.44–1.00)
GFR, Estimated: 58 mL/min — ABNORMAL LOW (ref >60)
Glucose, Bld: 125 mg/dL — ABNORMAL HIGH (ref 70–99)
Potassium: 3.6 mmol/L (ref 3.5–5.1)
Sodium: 138 mmol/L (ref 135–145)
Total Bilirubin: 0.9 mg/dL (ref 0.0–1.2)
Total Protein: 6.7 g/dL (ref 6.5–8.1)

## 2023-06-09 LAB — CBC WITH DIFFERENTIAL (CANCER CENTER ONLY)
Abs Immature Granulocytes: 0.02 10*3/uL (ref 0.00–0.07)
Basophils Absolute: 0 10*3/uL (ref 0.0–0.1)
Basophils Relative: 1 %
Eosinophils Absolute: 0.1 10*3/uL (ref 0.0–0.5)
Eosinophils Relative: 1 %
HCT: 38.3 % (ref 36.0–46.0)
Hemoglobin: 13 g/dL (ref 12.0–15.0)
Immature Granulocytes: 1 %
Lymphocytes Relative: 23 %
Lymphs Abs: 0.9 10*3/uL (ref 0.7–4.0)
MCH: 31.9 pg (ref 26.0–34.0)
MCHC: 33.9 g/dL (ref 30.0–36.0)
MCV: 93.9 fL (ref 80.0–100.0)
Monocytes Absolute: 0.6 10*3/uL (ref 0.1–1.0)
Monocytes Relative: 14 %
Neutro Abs: 2.5 10*3/uL (ref 1.7–7.7)
Neutrophils Relative %: 60 %
Platelet Count: 119 10*3/uL — ABNORMAL LOW (ref 150–400)
RBC: 4.08 MIL/uL (ref 3.87–5.11)
RDW: 14 % (ref 11.5–15.5)
WBC Count: 4.1 10*3/uL (ref 4.0–10.5)
nRBC: 0 % (ref 0.0–0.2)

## 2023-06-09 MED ORDER — SODIUM CHLORIDE 0.9% FLUSH
10.0000 mL | Freq: Once | INTRAVENOUS | Status: AC
  Administered 2023-06-09: 10 mL

## 2023-06-09 MED ORDER — HEPARIN SOD (PORK) LOCK FLUSH 100 UNIT/ML IV SOLN
500.0000 [IU] | Freq: Once | INTRAVENOUS | Status: AC
  Administered 2023-06-09: 500 [IU]

## 2023-06-09 NOTE — Progress Notes (Signed)
 HEMATOLOGY/ONCOLOGY CLINIC NOTE  Date of Service: 06/09/23  Patient Care Team: Delorse Lek, FNP as PCP - General (Family Medicine) Wyline Mood, Dorothe Pea, MD as PCP - Cardiology (Cardiology) Delorse Lek, FNP as PCP - Family Medicine (Family Medicine) Jena Gauss, Gerrit Friends, MD as Consulting Physician (Gastroenterology)  CHIEF COMPLAINTS/PURPOSE OF CONSULTATION:  Continued follow-up for evaluation and management of multiple myeloma  HISTORY OF PRESENTING ILLNESS:  Please see previous note for details on initial presentation  INTERVAL HISTORY  Megan Velasquez is a 63 y.o. female is here for continued evaluation and management of her multiple myeloma. Patient was last seen by me on 04/16/2023 and reported an episode of mild lightheadedness, left elbow pain sometimes, and diarrhea sometimes.   Today, she is accompanied by her husband. Patient is tolerating Revlimid well with no toxicity issues.   Patient complains of a sore throat, which she attributes to allergies. She reports that she does tend to endorse seasonal allergies. Patient notes that recent culture testing was negative. She reports that her sore throat has improved recently. Patient is on antibiotics for her sore throat at this time.   Patient reports no new concerns from her last clinical visit otherwise. She denies any infection, new skin rash, bone pain, abdominal pain, or leg swelling. Patient reports eating well and reports that she is sleeping well most nights. She denies any acid reflux issues.   She is UTD with her cancer screenings, including mammogram and colonoscopies.   MEDICAL HISTORY:  Past Medical History:  Diagnosis Date   Cancer (HCC)    GERD (gastroesophageal reflux disease)    History of kidney stones    HTN (hypertension)    Multiple myeloma (HCC)    PAD (peripheral artery disease) (HCC)    Renal disorder     SURGICAL HISTORY: Past Surgical History:  Procedure Laterality Date   ABDOMINAL AORTOGRAM  W/LOWER EXTREMITY N/A 12/24/2021   Procedure: ABDOMINAL AORTOGRAM W/LOWER EXTREMITY;  Surgeon: Victorino Sparrow, MD;  [facility]: [facility] INVASIVE CV LAB;  Service: Cardiovascular;  Laterality: N/A;   CHOLECYSTECTOMY     COLONOSCOPY  01/2015   Dr. Teena Dunk: Mild diverticulosis, sessile polyp ranging 3 to 5 mm removed from the proximal transverse colon, semi-pedunculated polyp 5 to 9 mm in size removed from the sigmoid colon.  Sigmoid colon polyp was serrated adenoma, transverse colon polyp was adenomatous.  Patient was told to have another colonoscopy in 3 years.   COLONOSCOPY WITH PROPOFOL N/A 10/11/2017   Procedure: COLONOSCOPY WITH PROPOFOL;  Surgeon: Corbin Ade, MD;  Location: [facility];  Service: Endoscopy;  Laterality: N/A;  1:15pm   COLONOSCOPY WITH PROPOFOL N/A 01/06/2023   Procedure: COLONOSCOPY WITH PROPOFOL;  Surgeon: Corbin Ade, MD;  Location: [facility];  Service: Endoscopy;  Laterality: N/A;  1:15pm, asa 3   IR IMAGING GUIDED PORT INSERTION  06/05/2020   PERIPHERAL VASCULAR INTERVENTION  12/24/2021   Procedure: PERIPHERAL VASCULAR INTERVENTION;  Surgeon: Victorino Sparrow, MD;  Location: [facility] INVASIVE CV LAB;  Service: Cardiovascular;;   POLYPECTOMY  10/11/2017   Procedure: POLYPECTOMY;  Surgeon: Corbin Ade, MD;  Location: [facility];  Service: Endoscopy;;  descending colon polyps cs times 2    SOCIAL HISTORY: Social History   Socioeconomic History   Marital status: Married    Spouse name: Not on file   Number of children: Not on file   Years of education: Not on file   Highest education level: Not on file  Occupational History   Not on file  Tobacco Use   Smoking status: Former    Current packs/day: 0.00    Average packs/day: 1 pack/day for 28.0 years (28.0 ttl pk-yrs)    Types: Cigarettes    Start date: 10/08/1985    Quit date: 10/08/2013    Years since quitting: 9.6   Smokeless tobacco: Never  Vaping Use   Vaping status: Never Used  Substance and Sexual Activity    Alcohol use: Not Currently   Drug use: Never   Sexual activity: Yes    Birth control/protection: Post-menopausal  Other Topics Concern   Not on file  Social History Narrative   Not on file   Social Drivers of Health   Financial Resource Strain: Low Risk  (09/13/2020)   Received from [facility], [facility]   Overall Financial Resource Strain (CARDIA)    Difficulty of Paying Living Expenses: Not hard at all  Food Insecurity: No Food Insecurity (09/13/2020)   Received from [facility]   Hunger Vital Sign    Worried About Running Out of Food in the Last Year: Never true    Ran Out of Food in the Last Year: Never true  Transportation Needs: No Transportation Needs (09/13/2020)   Received from [facility], [facility]   [facility] - Transportation    Lack of Transportation (Medical): No    Lack of Transportation (Non-Medical): No  Physical Activity: Inactive (12/22/2022)   Received from [facility]   Exercise Vital Sign    Days of Exercise per Week: 0 days    Minutes of Exercise per Session: 0 min  Stress: No Stress Concern Present (12/22/2022)   Received from [facility] - Occupational Stress Questionnaire    Feeling of Stress : Not at all  Social Connections: Unknown (06/06/2020)   Social Connection and Isolation Panel [NHANES]    Frequency of Communication with Friends and Family: More than three times a week    Frequency of Social Gatherings with Friends and Family: More than three times a week    Attends Religious Services: Not on file    Active Member of Clubs or Organizations: Not on file    Attends Banker Meetings: Not on file    Marital Status: Married  Intimate Partner Violence: Not At Risk (12/22/2022)   Received from [facility]   Humiliation, Afraid, Rape, and Kick questionnaire    Fear of Current or Ex-Partner: No    Emotionally Abused: No    Physically Abused: No     Sexually Abused: No    FAMILY HISTORY: Family History  Problem Relation Age of Onset   Heart disease Mother    Diabetes Mother    Lung cancer Father    COPD Father    Colon cancer Neg Hx     ALLERGIES:  has no known allergies.  MEDICATIONS:  Current Outpatient Medications  Medication Sig Dispense Refill   acetaminophen (TYLENOL) 500 MG tablet Take 1,000 mg by mouth every 6 (six) hours as needed for fever or headache.     ADVAIR HFA 230-21 MCG/ACT inhaler Inhale 2 puffs into the lungs 2 (two) times daily.     Ascorbic Acid (VITAMIN C) 1000 MG tablet Take 1,000 mg by mouth every morning.     b complex vitamins capsule Take 1 capsule by mouth daily. (Patient taking differently: Take 1 capsule by mouth  every morning.) 30 capsule 3   Cholecalciferol (VITAMIN D3) 5000 units CAPS Take 5,000 Units by mouth every morning.     clopidogrel (PLAVIX) 75 MG tablet Take 1 tablet by mouth once daily 30 tablet 11   Cyanocobalamin (VITAMIN B12) 3000 MCG SUBL Place 3,000 mcg under the tongue every morning.     ELIQUIS 2.5 MG TABS tablet Take 1 tablet by mouth twice daily 60 tablet 0   famotidine (PEPCID) 20 MG tablet TAKE 1 TABLET BY MOUTH AT BEDTIME 30 tablet 11   lisinopril-hydrochlorothiazide (ZESTORETIC) 10-12.5 MG tablet Take 1 tablet by mouth daily.     loratadine (CLARITIN) 10 MG tablet Take 10 mg by mouth every morning.     omeprazole (PRILOSEC) 40 MG capsule Take 40 mg by mouth daily as needed (acid reflux).     ondansetron (ZOFRAN) 8 MG tablet Take 1 tablet (8 mg total) by mouth 2 (two) times daily as needed (Nausea or vomiting). 30 tablet 1   REVLIMID 10 MG capsule TAKE 1 CAPSULE BY MOUTH DAILY FOR 21 DAYS, THEN TAKE 7 DAYS OFF 21 capsule 0   rosuvastatin (CRESTOR) 10 MG tablet Take 10 mg by mouth daily.     No current facility-administered medications for this visit.    REVIEW OF SYSTEMS:    10 Point review of Systems was done is negative except as noted above.   PHYSICAL  EXAMINATION:  ECOG PERFORMANCE STATUS: 1 - Symptomatic but completely ambulatory .BP (!) 136/91 (BP Location: Left Arm, Patient Position: Sitting)   Pulse 93   Temp 97.7 F (36.5 C) (Tympanic)   Resp 18   Ht 5\' 5"  (1.651 m)   Wt 133 lb 9.6 oz (60.6 kg)   SpO2 100%   BMI 22.23 kg/m  GENERAL:alert, in no acute distress and comfortable SKIN: no acute rashes, no significant lesions EYES: conjunctiva are pink and non-injected, sclera anicteric OROPHARYNX: MMM, no exudates, no oropharyngeal erythema or ulceration NECK: supple, no JVD LYMPH:  no palpable lymphadenopathy in the cervical, axillary or inguinal regions LUNGS: clear to auscultation b/l with normal respiratory effort HEART: regular rate & rhythm ABDOMEN:  normoactive bowel sounds , non tender, not distended. Extremity: no pedal edema PSYCH: alert & oriented x 3 with fluent speech NEURO: no focal motor/sensory deficits   LABORATORY DATA:  I have reviewed the data as listed  .    Latest Ref Rng & Units 06/09/2023    8:42 AM 04/16/2023    9:38 AM 01/13/2023    9:14 AM  CBC  WBC 4.0 - 10.5 K/uL 4.1  3.6  3.6   Hemoglobin 12.0 - 15.0 g/dL 16.1  09.6  04.5   Hematocrit 36.0 - 46.0 % 38.3  37.8  39.2   Platelets 150 - 400 K/uL 119  131  154     .    Latest Ref Rng & Units 06/09/2023    8:42 AM 04/16/2023    9:38 AM 01/13/2023    9:14 AM  CMP  Glucose 70 - 99 mg/dL 409  94  811   BUN 8 - 23 mg/dL 18  17  26    Creatinine 0.44 - 1.00 mg/dL 9.14  7.82  9.56   Sodium 135 - 145 mmol/L 138  137  138   Potassium 3.5 - 5.1 mmol/L 3.6  4.1  3.7   Chloride 98 - 111 mmol/L 101  104  106   CO2 22 - 32 mmol/L 28  27  25  Calcium 8.9 - 10.3 mg/dL 9.0  9.0  9.3   Total Protein 6.5 - 8.1 g/dL 6.7  6.4  6.8   Total Bilirubin 0.0 - 1.2 mg/dL 0.9  0.8  0.7   Alkaline Phos 38 - 126 U/L 71  72  67   AST 15 - 41 U/L 15  17  16    ALT 0 - 44 U/L 16  17  14       Most recent lab results (03/15/2020) of CBC is as follows: all values are  WNL except for RBC at 3.66, Hgb at 9.7, HCT at 31.8, MCHC at 30.5, RDW Standard Deviation 58.7, Red Cell Distribution Width At 18.6,  BUN at 19, Creatinine at 1.06, Total Protein at 10.2, Albumin at 2.7, AST at 14,. 03/13/2020 UPEP shows no M-Spike, all values are WNL 03/06/2020 SPEP shows Total Protein at 10.1, Beta Globulin at 5.2, M-Spike at 4.3, Total Globulin at 6.4, A/G Ratio at 0.6 03/06/2020 Free Kappa Light Chains at 181.1, Free Lambda Light Chains Serum at 3.6, K/L ratio at 50.31 03/06/2020 Beta-2 Microglobulin at 2.5           RADIOGRAPHIC STUDIES: I have personally reviewed the radiological images as listed and agreed with the findings in the report. No results found.  ASSESSMENT & PLAN:   63 year old female here for continued follow-up, evaluation and management of multiple myeloma  #1  IgA kappa multiple myeloma status post induction treatment with Carfilzomib Revlimid dexamethasone  presenting with anemia hemoglobin 9.4. Creatinine 1.28 No hypercalcemia Bone survey with no obvious skeletal lesions. Baseline M spike of 4.3 g/dL. Beta-2 Microglobulin at 2.5 Mol Cy Monosomy 13 and dup 1q  2) status post stem cell collection assisted with G-CSF and Plerixafor. Currently on maintenance Revlimid. Currently had declined proceeding with consolidative autologous HSCT  3) Diarrhea - ? Related to Revlimid.  Mild and nearly resolved GI panel and c diff neg  PLAN:  -Discussed lab results on 06/09/23 in detail with patient. CBC showed WBC of 4.1K, hemoglobin of 13.0, and platelets of 119K. -platelets are slightly low, though not concerning. This could be related to revlimid or allergies.  -kidneys are stable with creatinine level of 1.08 mg/dL. Her other electrolytes are normal.  -her last myeloma panel from 04/16/2023 showed that her M protein continued to be undetectable and she continues to be in remission -myeloma panel from today -shows no detectable M spike -did not  feel any enlarged lymph nodes during physical examination -patient has no clinical sign or lab evidence of disease progression at this time -Patient reports no significant toxicities from her current dose of Revlimid at this time.  -continue 10 mg Revlimid for maintenance.  -answered all of patient's questions in detail  FOLLOW-UP: RTC with Dr Candise Che with labs in 2 months   The total time spent in the appointment was 21 minutes* .  All of the patient's questions were answered with apparent satisfaction. The patient knows to call the clinic with any problems, questions or concerns.   Wyvonnia Lora MD MS AAHIVMS [facility] Hematology/Oncology Physician [facility]  .*Total Encounter Time as defined by the Centers for Medicare and Medicaid Services includes, in addition to the face-to-face time of a patient visit (documented in the note above) non-face-to-face time: obtaining and reviewing outside history, ordering and reviewing medications, tests or procedures, care coordination (communications with other health care professionals or caregivers) and documentation in the medical record.    I,Mitra Faeizi,acting as a  scribe for Wyvonnia Lora, MD.,have documented all relevant documentation on the behalf of Wyvonnia Lora, MD,as directed by  Wyvonnia Lora, MD while in the presence of Wyvonnia Lora, MD.  .I have reviewed the above documentation for accuracy and completeness, and I agree with the above. Johney Maine MD

## 2023-06-11 LAB — MULTIPLE MYELOMA PANEL, SERUM
Albumin SerPl Elph-Mcnc: 3.7 g/dL (ref 2.9–4.4)
Albumin/Glob SerPl: 1.5 (ref 0.7–1.7)
Alpha 1: 0.2 g/dL (ref 0.0–0.4)
Alpha2 Glob SerPl Elph-Mcnc: 0.8 g/dL (ref 0.4–1.0)
B-Globulin SerPl Elph-Mcnc: 0.9 g/dL (ref 0.7–1.3)
Gamma Glob SerPl Elph-Mcnc: 0.7 g/dL (ref 0.4–1.8)
Globulin, Total: 2.6 g/dL (ref 2.2–3.9)
IgA: 141 mg/dL (ref 87–352)
IgG (Immunoglobin G), Serum: 710 mg/dL (ref 586–1602)
IgM (Immunoglobulin M), Srm: 17 mg/dL — ABNORMAL LOW (ref 26–217)
Total Protein ELP: 6.3 g/dL (ref 6.0–8.5)

## 2023-06-14 ENCOUNTER — Other Ambulatory Visit: Payer: Self-pay | Admitting: Hematology

## 2023-06-14 DIAGNOSIS — C9000 Multiple myeloma not having achieved remission: Secondary | ICD-10-CM

## 2023-06-15 ENCOUNTER — Encounter: Payer: Self-pay | Admitting: Hematology

## 2023-06-18 ENCOUNTER — Other Ambulatory Visit: Payer: BC Managed Care – PPO

## 2023-06-18 ENCOUNTER — Ambulatory Visit: Payer: BC Managed Care – PPO | Admitting: Hematology

## 2023-06-30 ENCOUNTER — Other Ambulatory Visit: Payer: Self-pay

## 2023-06-30 DIAGNOSIS — C9000 Multiple myeloma not having achieved remission: Secondary | ICD-10-CM

## 2023-06-30 MED ORDER — LENALIDOMIDE 10 MG PO CAPS: ORAL_CAPSULE | ORAL | 0 refills | Status: DC

## 2023-07-13 ENCOUNTER — Other Ambulatory Visit: Payer: Self-pay

## 2023-07-13 DIAGNOSIS — C9000 Multiple myeloma not having achieved remission: Secondary | ICD-10-CM

## 2023-07-13 MED ORDER — LENALIDOMIDE 10 MG PO CAPS: ORAL_CAPSULE | ORAL | 0 refills | Status: DC

## 2023-08-06 ENCOUNTER — Other Ambulatory Visit: Payer: Self-pay | Admitting: Hematology

## 2023-08-06 DIAGNOSIS — C9000 Multiple myeloma not having achieved remission: Secondary | ICD-10-CM

## 2023-08-09 ENCOUNTER — Encounter: Payer: Self-pay | Admitting: Hematology

## 2023-08-09 ENCOUNTER — Other Ambulatory Visit: Payer: Self-pay | Admitting: *Deleted

## 2023-08-09 DIAGNOSIS — C9001 Multiple myeloma in remission: Secondary | ICD-10-CM

## 2023-08-10 ENCOUNTER — Inpatient Hospital Stay: Admitting: Hematology

## 2023-08-10 ENCOUNTER — Inpatient Hospital Stay: Attending: Hematology

## 2023-08-10 VITALS — BP 131/71 | HR 75 | Temp 97.7°F | Resp 17 | Wt 137.5 lb

## 2023-08-10 DIAGNOSIS — C9001 Multiple myeloma in remission: Secondary | ICD-10-CM

## 2023-08-10 DIAGNOSIS — Z95828 Presence of other vascular implants and grafts: Secondary | ICD-10-CM

## 2023-08-10 DIAGNOSIS — C9000 Multiple myeloma not having achieved remission: Secondary | ICD-10-CM | POA: Insufficient documentation

## 2023-08-10 DIAGNOSIS — Z87891 Personal history of nicotine dependence: Secondary | ICD-10-CM | POA: Insufficient documentation

## 2023-08-10 DIAGNOSIS — Z7961 Long term (current) use of immunomodulator: Secondary | ICD-10-CM | POA: Diagnosis not present

## 2023-08-10 LAB — CMP (CANCER CENTER ONLY)
ALT: 15 U/L (ref 0–44)
AST: 16 U/L (ref 15–41)
Albumin: 4.3 g/dL (ref 3.5–5.0)
Alkaline Phosphatase: 68 U/L (ref 38–126)
Anion gap: 7 (ref 5–15)
BUN: 21 mg/dL (ref 8–23)
CO2: 29 mmol/L (ref 22–32)
Calcium: 8.9 mg/dL (ref 8.9–10.3)
Chloride: 102 mmol/L (ref 98–111)
Creatinine: 1.07 mg/dL — ABNORMAL HIGH (ref 0.44–1.00)
GFR, Estimated: 58 mL/min — ABNORMAL LOW (ref >60)
Glucose, Bld: 127 mg/dL — ABNORMAL HIGH (ref 70–99)
Potassium: 3.8 mmol/L (ref 3.5–5.1)
Sodium: 138 mmol/L (ref 135–145)
Total Bilirubin: 0.9 mg/dL (ref 0.0–1.2)
Total Protein: 6.4 g/dL — ABNORMAL LOW (ref 6.5–8.1)

## 2023-08-10 LAB — CBC WITH DIFFERENTIAL (CANCER CENTER ONLY)
Abs Immature Granulocytes: 0.02 10*3/uL (ref 0.00–0.07)
Basophils Absolute: 0 10*3/uL (ref 0.0–0.1)
Basophils Relative: 1 %
Eosinophils Absolute: 0.2 10*3/uL (ref 0.0–0.5)
Eosinophils Relative: 4 %
HCT: 35.1 % — ABNORMAL LOW (ref 36.0–46.0)
Hemoglobin: 12.4 g/dL (ref 12.0–15.0)
Immature Granulocytes: 1 %
Lymphocytes Relative: 22 %
Lymphs Abs: 0.8 10*3/uL (ref 0.7–4.0)
MCH: 32.2 pg (ref 26.0–34.0)
MCHC: 35.3 g/dL (ref 30.0–36.0)
MCV: 91.2 fL (ref 80.0–100.0)
Monocytes Absolute: 0.4 10*3/uL (ref 0.1–1.0)
Monocytes Relative: 12 %
Neutro Abs: 2.2 10*3/uL (ref 1.7–7.7)
Neutrophils Relative %: 60 %
Platelet Count: 111 10*3/uL — ABNORMAL LOW (ref 150–400)
RBC: 3.85 MIL/uL — ABNORMAL LOW (ref 3.87–5.11)
RDW: 13.6 % (ref 11.5–15.5)
WBC Count: 3.6 10*3/uL — ABNORMAL LOW (ref 4.0–10.5)
nRBC: 0 % (ref 0.0–0.2)

## 2023-08-10 MED ORDER — HEPARIN SOD (PORK) LOCK FLUSH 100 UNIT/ML IV SOLN
500.0000 [IU] | Freq: Once | INTRAVENOUS | Status: AC
  Administered 2023-08-10: 500 [IU]

## 2023-08-10 MED ORDER — SODIUM CHLORIDE 0.9% FLUSH
10.0000 mL | Freq: Once | INTRAVENOUS | Status: AC
Start: 2023-08-10 — End: 2023-08-10
  Administered 2023-08-10: 10 mL

## 2023-08-12 LAB — MULTIPLE MYELOMA PANEL, SERUM
Albumin SerPl Elph-Mcnc: 3.6 g/dL (ref 2.9–4.4)
Albumin/Glob SerPl: 1.6 (ref 0.7–1.7)
Alpha 1: 0.2 g/dL (ref 0.0–0.4)
Alpha2 Glob SerPl Elph-Mcnc: 0.6 g/dL (ref 0.4–1.0)
B-Globulin SerPl Elph-Mcnc: 0.8 g/dL (ref 0.7–1.3)
Gamma Glob SerPl Elph-Mcnc: 0.6 g/dL (ref 0.4–1.8)
Globulin, Total: 2.3 g/dL (ref 2.2–3.9)
IgA: 132 mg/dL (ref 87–352)
IgG (Immunoglobin G), Serum: 694 mg/dL (ref 586–1602)
IgM (Immunoglobulin M), Srm: 16 mg/dL — ABNORMAL LOW (ref 26–217)
Total Protein ELP: 5.9 g/dL — ABNORMAL LOW (ref 6.0–8.5)

## 2023-08-13 NOTE — Progress Notes (Signed)
 HEMATOLOGY/ONCOLOGY CLINIC NOTE  Date of Service: 08/10/2023  Patient Care Team: Daphney Eans, FNP as PCP - General (Family Medicine) Amanda Jungling, Joyceann No, MD as PCP - Cardiology (Cardiology) Daphney Eans, FNP as PCP - Family Medicine (Family Medicine) Riley Cheadle, Windsor Hatcher, MD as Consulting Physician (Gastroenterology) Frankie Israel, MD as Consulting Physician (Hematology)  CHIEF COMPLAINTS/PURPOSE OF CONSULTATION:  Continued follow-up for evaluation and management of multiple myeloma  HISTORY OF PRESENTING ILLNESS:  Please see previous note for details on initial presentation  INTERVAL HISTORY  Megan Velasquez is a 63 y.o. female is here for continued evaluation and management of her multiple myeloma. Patient was last seen by me on 06/09/2023 and complained of sore throat attributed to seasonal allergies.  Patient notes no acute new symptoms since her last clinic visit about 2 months ago.  No new fevers chills night sweats.  No new focal bone pains. Does not report any toxicities from her maintenance dose of Revlimid . No new skin rashes or diarrhea.   MEDICAL HISTORY:  Past Medical History:  Diagnosis Date   Cancer (HCC)    GERD (gastroesophageal reflux disease)    History of kidney stones    HTN (hypertension)    Multiple myeloma (HCC)    PAD (peripheral artery disease) (HCC)    Renal disorder     SURGICAL HISTORY: Past Surgical History:  Procedure Laterality Date   ABDOMINAL AORTOGRAM W/LOWER EXTREMITY N/A 12/24/2021   Procedure: ABDOMINAL AORTOGRAM W/LOWER EXTREMITY;  Surgeon: Kayla Part, MD;  Location: [facility] INVASIVE CV LAB;  Service: Cardiovascular;  Laterality: N/A;   CHOLECYSTECTOMY     COLONOSCOPY  01/2015   Dr. Alline Ivans: Mild diverticulosis, sessile polyp ranging 3 to 5 mm removed from the proximal transverse colon, semi-pedunculated polyp 5 to 9 mm in size removed from the sigmoid colon.  Sigmoid colon polyp was serrated adenoma, transverse colon polyp was  adenomatous.  Patient was told to have another colonoscopy in 3 years.   COLONOSCOPY WITH PROPOFOL  N/A 10/11/2017   Procedure: COLONOSCOPY WITH PROPOFOL ;  Surgeon: Suzette Espy, MD;  Location[facility];  Service: Endoscopy;  Laterality: N/A;  1:15pm   COLONOSCOPY WITH PROPOFOL  N/A 01/06/2023   Procedure: COLONOSCOPY WITH PROPOFOL ;  Surgeon: Suzette Espy, MD;  Location: [facility];  Service: Endoscopy;  Laterality: N/A;  1:15pm, asa 3   IR IMAGING GUIDED PORT INSERTION  06/05/2020   PERIPHERAL VASCULAR INTERVENTION  12/24/2021   Procedure: PERIPHERAL VASCULAR INTERVENTION;  Surgeon: Kayla Part, MD;  Location: [facility] INVASIVE CV LAB;  Service: Cardiovascular;;   POLYPECTOMY  10/11/2017   Procedure: POLYPECTOMY;  Surgeon: Suzette Espy, MD;  Location: [facility];  Service: Endoscopy;;  descending colon polyps cs times 2    SOCIAL HISTORY: Social History   Socioeconomic History   Marital status: Married    Spouse name: Not on file   Number of children: Not on file   Years of education: Not on file   Highest education level: Not on file  Occupational History   Not on file  Tobacco Use   Smoking status: Former    Current packs/day: 0.00    Average packs/day: 1 pack/day for 28.0 years (28.0 ttl pk-yrs)    Types: Cigarettes    Start date: 10/08/1985    Quit date: 10/08/2013    Years since quitting: 9.8   Smokeless tobacco: Never  Vaping Use   Vaping status: Never Used  Substance and Sexual Activity  Alcohol use: Not Currently   Drug use: Never   Sexual activity: Yes    Birth control/protection: Post-menopausal  Other Topics Concern   Not on file  Social History Narrative   Not on file   Social Drivers of Health   Financial Resource Strain: Low Risk  (09/13/2020)   Received [facility]   Overall Financial Resource Strain (CARDIA)    Difficulty of Paying Living Expenses: Not hard at all  Food Insecurity: No Food Insecurity (09/13/2020)    Received from [facility], [facility]   Hunger Vital Sign    Worried About Running Out of Food in the Last Year: Never true    Ran Out of Food in the Last Year: Never true  Transportation Needs: No Transportation Needs (09/13/2020)   Received from [facility], [facility]   [facility] - Transportation    Lack of Transportation (Medical): No    Lack of Transportation (Non-Medical): No  Physical Activity: Inactive (12/22/2022)   Received from [facility]   Exercise Vital Sign    Days of Exercise per Week: 0 days    Minutes of Exercise per Session: 0 min  Stress: No Stress Concern Present (12/22/2022)   Received from [facility] - Occupational Stress Questionnaire    Feeling of Stress : Not at all  Social Connections: Unknown (06/06/2020)   Social Connection and Isolation Panel [NHANES]    Frequency of Communication with Friends and Family: More than three times a week    Frequency of Social Gatherings with Friends and Family: More than three times a week    Attends Religious Services: Not on file    Active Member of Clubs or Organizations: Not on file    Attends Banker Meetings: Not on file    Marital Status: Married  Intimate Partner Violence: Not At Risk (12/22/2022)   Received from [facility]   Humiliation, Afraid, Rape, and Kick questionnaire    Fear of Current or Ex-Partner: No    Emotionally Abused: No    Physically Abused: No    Sexually Abused: No    FAMILY HISTORY: Family History  Problem Relation Age of Onset   Heart disease Mother    Diabetes Mother    Lung cancer Father    COPD Father    Colon cancer Neg Hx     ALLERGIES:  has no known allergies.  MEDICATIONS:  Current Outpatient Medications  Medication Sig Dispense Refill   acetaminophen  (TYLENOL ) 500 MG tablet Take 1,000 mg by mouth every 6 (six) hours as needed for fever or headache.     ADVAIR HFA 230-21 MCG/ACT inhaler  Inhale 2 puffs into the lungs 2 (two) times daily.     Ascorbic Acid  (VITAMIN C) 1000 MG tablet Take 1,000 mg by mouth every morning.     b complex vitamins capsule Take 1 capsule by mouth daily. (Patient taking differently: Take 1 capsule by mouth every morning.) 30 capsule 3   Cholecalciferol  (VITAMIN D3) 5000 units CAPS Take 5,000 Units by mouth every morning.     clopidogrel  (PLAVIX ) 75 MG tablet Take 1 tablet by mouth once daily 30 tablet 11   Cyanocobalamin  (VITAMIN B12) 3000 MCG SUBL Place 3,000 mcg under the tongue every morning.     ELIQUIS  2.5 MG TABS tablet Take 1 tablet by mouth twice daily 60 tablet 0   famotidine  (PEPCID ) 20  MG tablet TAKE 1 TABLET BY MOUTH AT BEDTIME 30 tablet 11   lenalidomide  (REVLIMID ) 10 MG capsule TAKE 1 CAPSULE BY MOUTH DAILY FOR 21 DAYS, THEN TAKE 7 DAYS OFF 21 capsule 0   lisinopril -hydrochlorothiazide  (ZESTORETIC ) 10-12.5 MG tablet Take 1 tablet by mouth daily.     loratadine  (CLARITIN ) 10 MG tablet Take 10 mg by mouth every morning.     omeprazole (PRILOSEC) 40 MG capsule Take 40 mg by mouth daily as needed (acid reflux).     ondansetron  (ZOFRAN ) 8 MG tablet Take 1 tablet (8 mg total) by mouth 2 (two) times daily as needed (Nausea or vomiting). 30 tablet 1   rosuvastatin (CRESTOR) 10 MG tablet Take 10 mg by mouth daily.     No current facility-administered medications for this visit.    REVIEW OF SYSTEMS:    10 Point review of Systems was done is negative except as noted above.   PHYSICAL EXAMINATION:  ECOG PERFORMANCE STATUS: 1 - Symptomatic but completely ambulatory .BP 131/71   Pulse 75   Temp 97.7 F (36.5 C) (Temporal)   Resp 17   Wt 137 lb 8 oz (62.4 kg)   SpO2 99%   BMI 22.88 kg/m   GENERAL:alert, in no acute distress and comfortable SKIN: no acute rashes, no significant lesions EYES: conjunctiva are pink and non-injected, sclera anicteric OROPHARYNX: MMM, no exudates, no oropharyngeal erythema or ulceration NECK: supple, no  JVD LYMPH:  no palpable lymphadenopathy in the cervical, axillary or inguinal regions LUNGS: clear to auscultation b/l with normal respiratory effort HEART: regular rate & rhythm ABDOMEN:  normoactive bowel sounds , non tender, not distended. Extremity: no pedal edema PSYCH: alert & oriented x 3 with fluent speech NEURO: no focal motor/sensory deficits   LABORATORY DATA:  I have reviewed the data as listed  .    Latest Ref Rng & Units 08/10/2023    8:33 AM 06/09/2023    8:42 AM 04/16/2023    9:38 AM  CBC  WBC 4.0 - 10.5 K/uL 3.6  4.1  3.6   Hemoglobin 12.0 - 15.0 g/dL 68.3  41.9  62.2   Hematocrit 36.0 - 46.0 % 35.1  38.3  37.8   Platelets 150 - 400 K/uL 111  119  131     .    Latest Ref Rng & Units 08/10/2023    8:33 AM 06/09/2023    8:42 AM 04/16/2023    9:38 AM  CMP  Glucose 70 - 99 mg/dL 297  989  94   BUN 8 - 23 mg/dL 21  18  17    Creatinine 0.44 - 1.00 mg/dL 2.11  9.41  7.40   Sodium 135 - 145 mmol/L 138  138  137   Potassium 3.5 - 5.1 mmol/L 3.8  3.6  4.1   Chloride 98 - 111 mmol/L 102  101  104   CO2 22 - 32 mmol/L 29  28  27    Calcium 8.9 - 10.3 mg/dL 8.9  9.0  9.0   Total Protein 6.5 - 8.1 g/dL 6.4  6.7  6.4   Total Bilirubin 0.0 - 1.2 mg/dL 0.9  0.9  0.8   Alkaline Phos 38 - 126 U/L 68  71  72   AST 15 - 41 U/L 16  15  17    ALT 0 - 44 U/L 15  16  17       Most recent lab results (03/15/2020) of CBC is as follows: all values are WNL  except for RBC at 3.66, Hgb at 9.7, HCT at 31.8, MCHC at 30.5, RDW Standard Deviation 58.7, Red Cell Distribution Width At 18.6,  BUN at 19, Creatinine at 1.06, Total Protein at 10.2, Albumin at 2.7, AST at 14,. 03/13/2020 UPEP shows no M-Spike, all values are WNL 03/06/2020 SPEP shows Total Protein at 10.1, Beta Globulin at 5.2, M-Spike at 4.3, Total Globulin at 6.4, A/G Ratio at 0.6 03/06/2020 Free Kappa Light Chains at 181.1, Free Lambda Light Chains Serum at 3.6, K/L ratio at 50.31 03/06/2020 Beta-2  Microglobulin at 2.5            RADIOGRAPHIC STUDIES: I have personally reviewed the radiological images as listed and agreed with the findings in the report. No results found.  ASSESSMENT & PLAN:   63 year old female here for continued follow-up, evaluation and management of multiple myeloma  #1  IgA kappa multiple myeloma status post induction treatment with Carfilzomib  Revlimid  dexamethasone   presenting with anemia hemoglobin 9.4. Creatinine 1.28 No hypercalcemia Bone survey with no obvious skeletal lesions. Baseline M spike of 4.3 g/dL. Beta-2  Microglobulin at 2.5 Mol Cy Monosomy 13 and dup 1q  2) status post stem cell collection assisted with G-CSF and Plerixafor. Currently on maintenance Revlimid . Currently had declined proceeding with consolidative autologous HSCT  3) Diarrhea - ? Related to Revlimid .  Mild and nearly resolved GI panel and c diff neg  PLAN: -Discussed lab results on 08/10/2023 in detail with patient. CBC showed WBC of 3.6K, hemoglobin of 12.4, and platelets of 111K. -CMP shows creatinine 1.07 mg/dL -her previous myeloma panel from 06/09/2023 showed that her M protein continued to be undetectable and she continues to be in remission -myeloma panel from today shows no detectable M spike. -Patient has no lab or clinical evidence of myeloma progression at this time. -She has no significant toxicities from her current maintenance Revlimid  at 10 mg p.o. daily 3 weeks on 1 week off.  She will continue the same. She continues to be on Eliquis  for VTE prophylaxis. She is also on Plavix  for her peripheral arterial disease. -answered all of patient's questions in detail  FOLLOW-UP: RTC with Dr Salomon Cree with labs in 10 weeks  The total time spent in the appointment was 20 minutes* .  All of the patient's questions were answered with apparent satisfaction. The patient knows to call the clinic with any problems, questions or concerns.   Jacquelyn Matt MD MS AAHIVMS [facility]  [facility] Hematology/Oncology Physician [facility]  .*Total Encounter Time as defined by the Centers for Medicare and Medicaid Services includes, in addition to the face-to-face time of a patient visit (documented in the note above) non-face-to-face time: obtaining and reviewing outside history, ordering and reviewing medications, tests or procedures, care coordination (communications with other health care professionals or caregivers) and documentation in the medical record.    I,Mitra Faeizi,acting as a Neurosurgeon for Jacquelyn Matt, MD.,have documented all relevant documentation on the behalf of Jacquelyn Matt, MD,as directed by  Jacquelyn Matt, MD while in the presence of Jacquelyn Matt, MD.  .I have reviewed the above documentation for accuracy and completeness, and I agree with the above. .Landis Dowdy Kishore Tajuana Kniskern MD

## 2023-08-16 ENCOUNTER — Encounter: Payer: Self-pay | Admitting: Hematology

## 2023-08-18 ENCOUNTER — Telehealth: Payer: Self-pay

## 2023-08-18 ENCOUNTER — Other Ambulatory Visit (HOSPITAL_COMMUNITY): Payer: Self-pay

## 2023-08-18 NOTE — Telephone Encounter (Signed)
 Oral Oncology Patient Advocate Encounter  Re-authorization   Received notification that prior authorization for Lenalidomide  is due for renewal.   PA submitted on 08/18/23  Sent via e-fax to [employer] at [phone]  Status is pending     Hansel Ley, CPhT Pharmacy Technician Coordinator [facility] [phone] (Ph) 08/18/2023 3:14 PM

## 2023-08-19 ENCOUNTER — Telehealth: Payer: Self-pay | Admitting: *Deleted

## 2023-08-19 NOTE — Telephone Encounter (Signed)
 Late entry for 08/18/23: Megan Velasquez notified office that [employer] informed her on 08/13/23 that her Prior Auth for lenalidomide  needed to be renewed and that they had informed the oral chemo pharmacy at [facility]. Megan Velasquez said that she called [employer] 5/14 and was told that they had not received a resonse from [facility]. She is concerned because she is to restart med on 08/20/23 and will not have it in time.  Contacted [employer] - rep Ardelia Beau provided confirmation of Megan Velasquez information and states a fax was sent to [facility] requesting new PA. She states that she can mark refill request urgent and that PA has been requested from provider. The medication may not arrive to patient for 2-3 days.   Contacted Megan Velasquez, RPH, [facility] pharmacist for assistance. Received response from Megan Velasquez that she contacted Megan Velasquez CPhT w/[facility] pharmacy. They are not aware of a request for Prior Auth, stating no record of receiving such request in [facility]. Megan Velasquez states that he would submit urgent Prior Auth request to insurer and notify [employer] when completed.  Megan Velasquez informed of patient concern regarding delay in restarting lenalidomide  after 7 day break. Per Megan Velasquez, ok for patient to delay restart for few days until PA is done and med is delivered.  Informed Megan Velasquez of all information as noted above. She stated she understood and will let office know if she does not hear from pharmacy.

## 2023-08-24 ENCOUNTER — Other Ambulatory Visit: Payer: Self-pay

## 2023-08-24 ENCOUNTER — Other Ambulatory Visit (HOSPITAL_COMMUNITY): Payer: Self-pay

## 2023-08-24 NOTE — Telephone Encounter (Signed)
 Oral Oncology Patient Advocate Encounter  Called to check status of prior authorization for Lenalidomide  through [employer].  Status is pending review. Rep put it on a spreadsheet to escalate it to priorety. We will receive a fax once a determination is made. He said typically 4-7 business days total.  Phone: [phone] Fax: [phone]   I will continue to check status until final determination.  Roda Cirri, CPhT Specialty Pharmacy Patient Advocate Phone: [phone] Fax: [phone]

## 2023-08-25 NOTE — Telephone Encounter (Addendum)
 Oral Oncology Patient Advocate Encounter  Prior Authorization for Lenalidomide  has been approved.    PA# [id number]  Effective dates: 08/24/23 through 08/23/24  Patient may continue to fill as they have been.   Called [employer] and provided them with approval information.   Called and left VM for patient informing them that "they could call [facility] and order medication prescribed by Dr. Salomon Cree, and that approval was on file through 08/23/24." I left my call back number of [phone] if patient has any questions or concerns.    Hansel Ley, CPhT Pharmacy Technician Coordinator [facility] [phone] (Ph) 08/25/2023 11:34 AM

## 2023-09-13 ENCOUNTER — Other Ambulatory Visit: Payer: Self-pay | Admitting: Hematology

## 2023-09-13 DIAGNOSIS — C9000 Multiple myeloma not having achieved remission: Secondary | ICD-10-CM

## 2023-09-15 ENCOUNTER — Encounter: Payer: Self-pay | Admitting: Hematology

## 2023-09-23 ENCOUNTER — Telehealth: Payer: Self-pay | Admitting: *Deleted

## 2023-09-23 NOTE — Telephone Encounter (Signed)
 Completed [employer] FMLA paperwork for spouse   Sent to provider to review, amend, sign and return to this nurse to return to claims benefit manager.

## 2023-09-24 ENCOUNTER — Other Ambulatory Visit: Payer: Self-pay

## 2023-10-04 ENCOUNTER — Other Ambulatory Visit: Payer: Self-pay

## 2023-10-04 ENCOUNTER — Other Ambulatory Visit: Payer: Self-pay | Admitting: Hematology

## 2023-10-04 DIAGNOSIS — C9000 Multiple myeloma not having achieved remission: Secondary | ICD-10-CM

## 2023-10-04 DIAGNOSIS — C9001 Multiple myeloma in remission: Secondary | ICD-10-CM

## 2023-10-05 ENCOUNTER — Inpatient Hospital Stay: Attending: Hematology

## 2023-10-05 ENCOUNTER — Inpatient Hospital Stay (HOSPITAL_BASED_OUTPATIENT_CLINIC_OR_DEPARTMENT_OTHER): Admitting: Hematology

## 2023-10-05 VITALS — BP 140/70 | HR 67 | Temp 97.2°F | Resp 18 | Wt 136.1 lb

## 2023-10-05 DIAGNOSIS — Z87891 Personal history of nicotine dependence: Secondary | ICD-10-CM | POA: Diagnosis not present

## 2023-10-05 DIAGNOSIS — R197 Diarrhea, unspecified: Secondary | ICD-10-CM | POA: Diagnosis not present

## 2023-10-05 DIAGNOSIS — C9000 Multiple myeloma not having achieved remission: Secondary | ICD-10-CM | POA: Insufficient documentation

## 2023-10-05 DIAGNOSIS — C9001 Multiple myeloma in remission: Secondary | ICD-10-CM

## 2023-10-05 DIAGNOSIS — R5383 Other fatigue: Secondary | ICD-10-CM | POA: Diagnosis not present

## 2023-10-05 DIAGNOSIS — R112 Nausea with vomiting, unspecified: Secondary | ICD-10-CM | POA: Diagnosis not present

## 2023-10-05 DIAGNOSIS — Z95828 Presence of other vascular implants and grafts: Secondary | ICD-10-CM

## 2023-10-05 LAB — CMP (CANCER CENTER ONLY)
ALT: 17 U/L (ref 0–44)
AST: 16 U/L (ref 15–41)
Albumin: 4.2 g/dL (ref 3.5–5.0)
Alkaline Phosphatase: 66 U/L (ref 38–126)
Anion gap: 7 (ref 5–15)
BUN: 22 mg/dL (ref 8–23)
CO2: 29 mmol/L (ref 22–32)
Calcium: 9.2 mg/dL (ref 8.9–10.3)
Chloride: 102 mmol/L (ref 98–111)
Creatinine: 1.09 mg/dL — ABNORMAL HIGH (ref 0.44–1.00)
GFR, Estimated: 57 mL/min — ABNORMAL LOW (ref >60)
Glucose, Bld: 90 mg/dL (ref 70–99)
Potassium: 4.1 mmol/L (ref 3.5–5.1)
Sodium: 138 mmol/L (ref 135–145)
Total Bilirubin: 0.7 mg/dL (ref 0.0–1.2)
Total Protein: 6.4 g/dL — ABNORMAL LOW (ref 6.5–8.1)

## 2023-10-05 LAB — CBC WITH DIFFERENTIAL (CANCER CENTER ONLY)
Abs Immature Granulocytes: 0.02 10*3/uL (ref 0.00–0.07)
Basophils Absolute: 0 10*3/uL (ref 0.0–0.1)
Basophils Relative: 1 %
Eosinophils Absolute: 0.2 10*3/uL (ref 0.0–0.5)
Eosinophils Relative: 5 %
HCT: 35.7 % — ABNORMAL LOW (ref 36.0–46.0)
Hemoglobin: 12.2 g/dL (ref 12.0–15.0)
Immature Granulocytes: 1 %
Lymphocytes Relative: 24 %
Lymphs Abs: 0.9 10*3/uL (ref 0.7–4.0)
MCH: 32 pg (ref 26.0–34.0)
MCHC: 34.2 g/dL (ref 30.0–36.0)
MCV: 93.7 fL (ref 80.0–100.0)
Monocytes Absolute: 0.5 10*3/uL (ref 0.1–1.0)
Monocytes Relative: 14 %
Neutro Abs: 2.1 10*3/uL (ref 1.7–7.7)
Neutrophils Relative %: 55 %
Platelet Count: 128 10*3/uL — ABNORMAL LOW (ref 150–400)
RBC: 3.81 MIL/uL — ABNORMAL LOW (ref 3.87–5.11)
RDW: 14.5 % (ref 11.5–15.5)
WBC Count: 3.6 10*3/uL — ABNORMAL LOW (ref 4.0–10.5)
nRBC: 0 % (ref 0.0–0.2)

## 2023-10-05 MED ORDER — SODIUM CHLORIDE 0.9% FLUSH
10.0000 mL | Freq: Once | INTRAVENOUS | Status: AC
  Administered 2023-10-05: 10 mL

## 2023-10-05 MED ORDER — HEPARIN SOD (PORK) LOCK FLUSH 100 UNIT/ML IV SOLN
250.0000 [IU] | Freq: Once | INTRAVENOUS | Status: AC
  Administered 2023-10-05: 250 [IU]

## 2023-10-05 NOTE — Progress Notes (Signed)
 HEMATOLOGY/ONCOLOGY CLINIC NOTE  Date of Service: 10/05/23   Patient Care Team: Almeda Loa ORN, FNP as PCP - General (Family Medicine) Alvan, Dorn FALCON, MD as PCP - Cardiology (Cardiology) Almeda Loa ORN, FNP as PCP - Family Medicine (Family Medicine) Shaaron, Lamar HERO, MD as Consulting Physician (Gastroenterology) Velasquez Megan Brink, MD as Consulting Physician (Hematology)  CHIEF COMPLAINTS/PURPOSE OF CONSULTATION:  Continued follow-up for evaluation and management of multiple myeloma  HISTORY OF PRESENTING ILLNESS:  Please see previous note for details on initial presentation  INTERVAL HISTORY  Megan Velasquez is a 63 y.o. female here for continued evaluation and management of her multiple myeloma.   Patient was last seen by me on 08/10/2023 and was doing well overall with no new medical complaints.  Today she is accompanied by husband.  Patient complains of constant fatigue. She does not attribute her fatigue to the heat. Patient notes that it is possible that her fatigue improves the week she is off of Revlimid , though her husband does not feel that there is a difference.   She has been eating well. Patient notes 1 occasion of being dehydrated for 1 day but has otherwise been well hydrated.   She reports nausea and vomiting. Patient complains of some diarrhea usually 2-3 times in the mornings. She notes that her diarrhea was previously daily but has since improved. Patient did use Imodium, which resolved symptoms.   She notes previously having experienced cramping which had resolved. Patient denies any new bone pain, fever, chills or night sweats.   She reports having taken an antibiotic, possibly Cephalexin, for a upper respiratory infection 3 days after her last visit. Patient notes that her URI has resolved.  Patient reports concerns of thinking vitamin B12 may damage the kidneys and notes she stopped it for some time. She has been recently taking B12 2500mg  every other  day. We discussed that her vitamin B12 should not cause kidney issues. Patient has been taking vitamin B complex daily.    MEDICAL HISTORY:  Past Medical History:  Diagnosis Date   Cancer (HCC)    GERD (gastroesophageal reflux disease)    History of kidney stones    HTN (hypertension)    Multiple myeloma (HCC)    PAD (peripheral artery disease) (HCC)    Renal disorder     SURGICAL HISTORY: Past Surgical History:  Procedure Laterality Date   ABDOMINAL AORTOGRAM W/LOWER EXTREMITY N/A 12/24/2021   Procedure: ABDOMINAL AORTOGRAM W/LOWER EXTREMITY;  Surgeon: Lanis Fonda BRAVO, MD;  Location[facility] INVASIVE CV LAB;  Service: Cardiovascular;  Laterality: N/A;   CHOLECYSTECTOMY     COLONOSCOPY  01/2015   Dr. Donnel: Mild diverticulosis, sessile polyp ranging 3 to 5 mm removed from the proximal transverse colon, semi-pedunculated polyp 5 to 9 mm in size removed from the sigmoid colon.  Sigmoid colon polyp was serrated adenoma, transverse colon polyp was adenomatous.  Patient was told to have another colonoscopy in 3 years.   COLONOSCOPY WITH PROPOFOL  N/A 10/11/2017   Procedure: COLONOSCOPY WITH PROPOFOL ;  Surgeon: Shaaron Lamar HERO, MD;  Location: [facility];  Service: Endoscopy;  Laterality: N/A;  1:15pm   COLONOSCOPY WITH PROPOFOL  N/A 01/06/2023   Procedure: COLONOSCOPY WITH PROPOFOL ;  Surgeon: Shaaron Lamar HERO, MD;  Location: [facility];  Service: Endoscopy;  Laterality: N/A;  1:15pm, asa 3   IR IMAGING GUIDED PORT INSERTION  06/05/2020   PERIPHERAL VASCULAR INTERVENTION  12/24/2021   Procedure: PERIPHERAL VASCULAR INTERVENTION;  Surgeon: Lanis Fonda BRAVO,  MD;  Location: [facility] INVASIVE CV LAB;  Service: Cardiovascular;;   POLYPECTOMY  10/11/2017   Procedure: POLYPECTOMY;  Surgeon: Shaaron Lamar HERO, MD;  Location: [facility];  Service: Endoscopy;;  descending colon polyps cs times 2    SOCIAL HISTORY: Social History   Socioeconomic History   Marital status: Married    Spouse name: Not on file    Number of children: Not on file   Years of education: Not on file   Highest education level: Not on file  Occupational History   Not on file  Tobacco Use   Smoking status: Former    Current packs/day: 0.00    Average packs/day: 1 pack/day for 28.0 years (28.0 ttl pk-yrs)    Types: Cigarettes    Start date: 10/08/1985    Quit date: 10/08/2013    Years since quitting: 9.9   Smokeless tobacco: Never  Vaping Use   Vaping status: Never Used  Substance and Sexual Activity   Alcohol use: Not Currently   Drug use: Never   Sexual activity: Yes    Birth control/protection: Post-menopausal  Other Topics Concern   Not on file  Social History Narrative   Not on file   Social Drivers of Health   Financial Resource Strain: Low Risk  (09/13/2020)   Received from [facility]   Overall Financial Resource Strain (CARDIA)    Difficulty of Paying Living Expenses: Not hard at all  Food Insecurity: No Food Insecurity (09/13/2020)   Received from [facility]   Hunger Vital Sign    Within the past 12 months, you worried that your food would run out before you got the money to buy more.: Never true    Within the past 12 months, the food you bought just didn't last and you didn't have money to get more.: Never true  Transportation Needs: No Transportation Needs (09/13/2020)   Received from [facility]   PRAPARE - Transportation    Lack of Transportation (Medical): No    Lack of Transportation (Non-Medical): No  Physical Activity: Inactive (12/22/2022)   Received from [facility]   Exercise Vital Sign    On average, how many days per week do you engage in moderate to strenuous exercise (like a brisk walk)?: 0 days    On average, how many minutes do you engage in exercise at this level?: 0 min  Stress: No Stress Concern Present (12/22/2022)   Received from [facility] - Occupational Stress Questionnaire    Feeling of Stress : Not at all   Social Connections: Unknown (06/06/2020)   Social Connection and Isolation Panel    Frequency of Communication with Friends and Family: More than three times a week    Frequency of Social Gatherings with Friends and Family: More than three times a week    Attends Religious Services: Not on file    Active Member of Clubs or Organizations: Not on file    Attends Banker Meetings: Not on file    Marital Status: Married  Intimate Partner Violence: Not At Risk (12/22/2022)   Received from [facility]   Humiliation, Afraid, Rape, and Kick questionnaire    Within the last year, have you been afraid of your partner or ex-partner?: No    Within the last year, have you been humiliated or emotionally abused in other ways by your partner or ex-partner?: No    Within the last  year, have you been kicked, hit, slapped, or otherwise physically hurt by your partner or ex-partner?: No    Within the last year, have you been raped or forced to have any kind of sexual activity by your partner or ex-partner?: No    FAMILY HISTORY: Family History  Problem Relation Age of Onset   Heart disease Mother    Diabetes Mother    Lung cancer Father    COPD Father    Colon cancer Neg Hx     ALLERGIES:  has no known allergies.  MEDICATIONS:  Current Outpatient Medications  Medication Sig Dispense Refill   acetaminophen  (TYLENOL ) 500 MG tablet Take 1,000 mg by mouth every 6 (six) hours as needed for fever or headache.     ADVAIR HFA 230-21 MCG/ACT inhaler Inhale 2 puffs into the lungs 2 (two) times daily.     Ascorbic Acid  (VITAMIN C) 1000 MG tablet Take 1,000 mg by mouth every morning.     b complex vitamins capsule Take 1 capsule by mouth daily. (Patient taking differently: Take 1 capsule by mouth every morning.) 30 capsule 3   Cholecalciferol  (VITAMIN D3) 5000 units CAPS Take 5,000 Units by mouth every morning.     clopidogrel  (PLAVIX ) 75 MG tablet Take 1 tablet by mouth once daily 30 tablet 11    Cyanocobalamin  (VITAMIN B12) 3000 MCG SUBL Place 3,000 mcg under the tongue every morning.     ELIQUIS  2.5 MG TABS tablet Take 1 tablet by mouth twice daily 60 tablet 0   famotidine  (PEPCID ) 20 MG tablet TAKE 1 TABLET BY MOUTH AT BEDTIME 30 tablet 11   lenalidomide  (REVLIMID ) 10 MG capsule TAKE 1 CAPSULE BY MOUTH DAILY FOR 21 DAYS, THEN TAKE 7 DAYS OFF 21 capsule 0   lisinopril -hydrochlorothiazide  (ZESTORETIC ) 10-12.5 MG tablet Take 1 tablet by mouth daily.     loratadine  (CLARITIN ) 10 MG tablet Take 10 mg by mouth every morning.     omeprazole (PRILOSEC) 40 MG capsule Take 40 mg by mouth daily as needed (acid reflux).     ondansetron  (ZOFRAN ) 8 MG tablet Take 1 tablet (8 mg total) by mouth 2 (two) times daily as needed (Nausea or vomiting). 30 tablet 1   rosuvastatin (CRESTOR) 10 MG tablet Take 10 mg by mouth daily.     No current facility-administered medications for this visit.    REVIEW OF SYSTEMS:    10 Point review of Systems was done is negative except as noted above.   PHYSICAL EXAMINATION:  ECOG PERFORMANCE STATUS: 1 - Symptomatic but completely ambulatory .There were no vitals taken for this visit.   GENERAL:alert, in no acute distress and comfortable SKIN: no acute rashes, no significant lesions EYES: conjunctiva are pink and non-injected, sclera anicteric OROPHARYNX: MMM, no exudates, no oropharyngeal erythema or ulceration NECK: supple, no JVD LYMPH:  no palpable lymphadenopathy in the cervical, axillary or inguinal regions LUNGS: clear to auscultation b/l with normal respiratory effort HEART: regular rate & rhythm ABDOMEN:  normoactive bowel sounds , non tender, not distended. Extremity: no pedal edema PSYCH: alert & oriented x 3 with fluent speech NEURO: no focal motor/sensory deficits    LABORATORY DATA:  I have reviewed the data as listed  .    Latest Ref Rng & Units 08/10/2023    8:33 AM 06/09/2023    8:42 AM 04/16/2023    9:38 AM  CBC  WBC 4.0 - 10.5  K/uL 3.6  4.1  3.6   Hemoglobin 12.0 - 15.0 g/dL  12.4  13.0  12.9   Hematocrit 36.0 - 46.0 % 35.1  38.3  37.8   Platelets 150 - 400 K/uL 111  119  131     .    Latest Ref Rng & Units 08/10/2023    8:33 AM 06/09/2023    8:42 AM 04/16/2023    9:38 AM  CMP  Glucose 70 - 99 mg/dL 872  874  94   BUN 8 - 23 mg/dL 21  18  17    Creatinine 0.44 - 1.00 mg/dL 8.92  8.91  9.00   Sodium 135 - 145 mmol/L 138  138  137   Potassium 3.5 - 5.1 mmol/L 3.8  3.6  4.1   Chloride 98 - 111 mmol/L 102  101  104   CO2 22 - 32 mmol/L 29  28  27    Calcium 8.9 - 10.3 mg/dL 8.9  9.0  9.0   Total Protein 6.5 - 8.1 g/dL 6.4  6.7  6.4   Total Bilirubin 0.0 - 1.2 mg/dL 0.9  0.9  0.8   Alkaline Phos 38 - 126 U/L 68  71  72   AST 15 - 41 U/L 16  15  17    ALT 0 - 44 U/L 15  16  17       Most recent lab results (03/15/2020) of CBC is as follows: all values are WNL except for RBC at 3.66, Hgb at 9.7, HCT at 31.8, MCHC at 30.5, RDW Standard Deviation 58.7, Red Cell Distribution Width At 18.6,  BUN at 19, Creatinine at 1.06, Total Protein at 10.2, Albumin at 2.7, AST at 14,. 03/13/2020 UPEP shows no M-Spike, all values are WNL 03/06/2020 SPEP shows Total Protein at 10.1, Beta Globulin at 5.2, M-Spike at 4.3, Total Globulin at 6.4, A/G Ratio at 0.6 03/06/2020 Free Kappa Light Chains at 181.1, Free Lambda Light Chains Serum at 3.6, K/L ratio at 50.31 03/06/2020 Beta-2  Microglobulin at 2.5           RADIOGRAPHIC STUDIES: I have personally reviewed the radiological images as listed and agreed with the findings in the report. No results found.  ASSESSMENT & PLAN:   63 year old female here for continued follow-up, evaluation and management of multiple myeloma  #1  IgA kappa multiple myeloma status post induction treatment with Carfilzomib  Revlimid  dexamethasone   presenting with anemia hemoglobin 9.4. Creatinine 1.28 No hypercalcemia Bone survey with no obvious skeletal lesions. Baseline M spike of 4.3  g/dL. Beta-2  Microglobulin at 2.5 Mol Cy Monosomy 13 and dup 1q  2) status post stem cell collection assisted with G-CSF and Plerixafor. Currently on maintenance Revlimid . Currently had declined proceeding with consolidative autologous HSCT  3) Diarrhea - ? Related to Revlimid .  Mild and nearly resolved GI panel and c diff neg  PLAN:  -Discussed lab results on 10/05/23  in detail with patient. CBC showed WBC of 3.6K, hemoglobin of 12.2, and platelets of 128K. - CMP pending at time of visit -Platelet is slightly low from Revlimid  though it is improving  - No anemia  -her last Myeloma lab from 08/10/2023 shows undetectable M protein and patient continues to be in remission.  - did not palpate any enlarged lymph nodes during physical exam -Patient has no lab or clinical evidence of myeloma progression at this time. -She has no significant toxicities from her current maintenance Revlimid  at 10 mg p.o. daily 3 weeks on 1 week off.  She will continue the same. -She continues to be on Eliquis  for  VTE prophylaxis. -She is also on Plavix  for her peripheral arterial disease. -Imodium can be used as needed for diarrhea and recommend optimizing fiber for stool.  - discussed that there is a need that her potassium is optimized with Revlimid   - recommend optimizing electrolytes and potassium in the diet. Discussed options of nuts, orange juice and coconut water . - recommend staying well hydrated with at least 2L of water  daily - recommend to continue vitamin B complex daily - Okay to take 2500 mg of B12 every other day. Discussed that very large doses of B12 can cause slightly low potassium.  -discussed that it is possible that her previous cramping was related to potassium, hydration status, or potentially Revlimid  to some extent - Answered all of the patient's and husband's questions in detail   FOLLOW-UP: RTC with Dr Velasquez with labs in 12 weeks  The total time spent in the appointment was ***  minutes* .  All of the patient's questions were answered with apparent satisfaction. The patient knows to call the clinic with any problems, questions or concerns.   Megan Onesimo MD MS AAHIVMS [facility] Hematology/Oncology Physician [facility]  .*Total Encounter Time as defined by the Centers for Medicare and Medicaid Services includes, in addition to the face-to-face time of a patient visit (documented in the note above) non-face-to-face time: obtaining and reviewing outside history, ordering and reviewing medications, tests or procedures, care coordination (communications with other health care professionals or caregivers) and documentation in the medical record.   I,Mitra Faeizi,acting as a Neurosurgeon for Megan Onesimo, MD.,have documented all relevant documentation on the behalf of Megan Onesimo, MD,as directed by  Megan Onesimo, MD while in the presence of Megan Onesimo, MD.  ***

## 2023-10-06 ENCOUNTER — Encounter: Payer: Self-pay | Admitting: Hematology

## 2023-10-07 ENCOUNTER — Encounter: Payer: Self-pay | Admitting: Hematology

## 2023-10-07 LAB — MULTIPLE MYELOMA PANEL, SERUM
Albumin SerPl Elph-Mcnc: 3.8 g/dL (ref 2.9–4.4)
Albumin/Glob SerPl: 1.7 (ref 0.7–1.7)
Alpha 1: 0.2 g/dL (ref 0.0–0.4)
Alpha2 Glob SerPl Elph-Mcnc: 0.7 g/dL (ref 0.4–1.0)
B-Globulin SerPl Elph-Mcnc: 0.9 g/dL (ref 0.7–1.3)
Gamma Glob SerPl Elph-Mcnc: 0.6 g/dL (ref 0.4–1.8)
Globulin, Total: 2.3 g/dL (ref 2.2–3.9)
IgA: 123 mg/dL (ref 87–352)
IgG (Immunoglobin G), Serum: 645 mg/dL (ref 586–1602)
IgM (Immunoglobulin M), Srm: 15 mg/dL — ABNORMAL LOW (ref 26–217)
Total Protein ELP: 6.1 g/dL (ref 6.0–8.5)

## 2023-11-02 ENCOUNTER — Other Ambulatory Visit: Payer: Self-pay | Admitting: Hematology

## 2023-11-02 DIAGNOSIS — C9000 Multiple myeloma not having achieved remission: Secondary | ICD-10-CM

## 2023-11-03 ENCOUNTER — Encounter: Payer: Self-pay | Admitting: Hematology

## 2023-11-04 ENCOUNTER — Ambulatory Visit: Payer: BC Managed Care – PPO | Attending: Vascular Surgery | Admitting: Physician Assistant

## 2023-11-04 ENCOUNTER — Ambulatory Visit (HOSPITAL_COMMUNITY)
Admission: RE | Admit: 2023-11-04 | Discharge: 2023-11-04 | Disposition: A | Discharge status: Home / Self Care | Payer: BC Managed Care – PPO | Source: Ambulatory Visit | Attending: Vascular Surgery | Admitting: Vascular Surgery

## 2023-11-04 ENCOUNTER — Ambulatory Visit (HOSPITAL_COMMUNITY)
Admission: RE | Admit: 2023-11-04 | Discharge: 2023-11-04 | Discharge status: Home / Self Care | Payer: BC Managed Care – PPO | Source: Ambulatory Visit | Attending: Vascular Surgery

## 2023-11-04 VITALS — BP 119/69 | HR 64 | Temp 97.8°F | Wt 135.8 lb

## 2023-11-04 DIAGNOSIS — I739 Peripheral vascular disease, unspecified: Secondary | ICD-10-CM

## 2023-11-04 DIAGNOSIS — Z95828 Presence of other vascular implants and grafts: Secondary | ICD-10-CM | POA: Diagnosis not present

## 2023-11-04 LAB — VAS US ABI WITH/WO TBI
Left ABI: 0.9
Right ABI: 0.99

## 2023-11-04 NOTE — Progress Notes (Signed)
 Office Note   History of Present Illness   Megan Velasquez is a 63 y.o. ([birth year]) female who presents for PAD follow-up.  She has a history of aortogram with right common iliac artery stenting on 12/24/2021 by Dr. Lanis.  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  She does have a history of multiple myeloma, treated with Revlimid , both of which led to a hypercoagulable state.  She returns today for follow-up.  She says that she is doing well without any issues.  She denies any rest pain, claudication, or tissue loss. She is happy to go to the beach with her family at the end of this month.   Current Outpatient Medications  Medication Sig Dispense Refill   acetaminophen  (TYLENOL ) 500 MG tablet Take 1,000 mg by mouth every 6 (six) hours as needed for fever or headache.     ADVAIR HFA 230-21 MCG/ACT inhaler Inhale 2 puffs into the lungs 2 (two) times daily.     Ascorbic Acid  (VITAMIN C) 1000 MG tablet Take 1,000 mg by mouth every morning.     b complex vitamins capsule Take 1 capsule by mouth daily. (Patient taking differently: Take 1 capsule by mouth every morning.) 30 capsule 3   Cholecalciferol  (VITAMIN D3) 5000 units CAPS Take 5,000 Units by mouth every morning.     clopidogrel  (PLAVIX ) 75 MG tablet Take 1 tablet by mouth once daily 30 tablet 11   Cyanocobalamin  (VITAMIN B12) 3000 MCG SUBL Place 3,000 mcg under the tongue every morning.     ELIQUIS  2.5 MG TABS tablet Take 1 tablet by mouth twice daily 60 tablet 0   famotidine  (PEPCID ) 20 MG tablet TAKE 1 TABLET BY MOUTH AT BEDTIME 30 tablet 11   lenalidomide  (REVLIMID ) 10 MG capsule TAKE 1 CAPSULE BY MOUTH DAILY FOR 21 DAYS, THEN TAKE 7 DAYS OFF 21 capsule 0   lisinopril -hydrochlorothiazide  (ZESTORETIC ) 10-12.5 MG tablet Take 1 tablet by mouth daily.     loratadine  (CLARITIN ) 10 MG tablet Take 10 mg by mouth every morning.     omeprazole (PRILOSEC) 40 MG capsule Take 40 mg by mouth daily as needed (acid reflux).      ondansetron  (ZOFRAN ) 8 MG tablet Take 1 tablet (8 mg total) by mouth 2 (two) times daily as needed (Nausea or vomiting). 30 tablet 1   rosuvastatin (CRESTOR) 10 MG tablet Take 10 mg by mouth daily.     No current facility-administered medications for this visit.    REVIEW OF SYSTEMS (negative unless checked):   Cardiac:  []  Chest pain or chest pressure? []  Shortness of breath upon activity? []  Shortness of breath when lying flat? []  Irregular heart rhythm?  Vascular:  []  Pain in calf, thigh, or hip brought on by walking? []  Pain in feet at night that wakes you up from your sleep? []  Blood clot in your veins? []  Leg swelling?  Pulmonary:  []  Oxygen at home? []  Productive cough? []  Wheezing?  Neurologic:  []  Sudden weakness in arms or legs? []  Sudden numbness in arms or legs? []  Sudden onset of difficult speaking or slurred speech? []  Temporary loss of vision in one eye? []  Problems with dizziness?  Gastrointestinal:  []  Blood in stool? []  Vomited blood?  Genitourinary:  []  Burning when urinating? []  Blood in urine?  Psychiatric:  []  Major depression  Hematologic:  []  Bleeding problems? []  Problems with blood clotting?  Dermatologic:  []  Rashes or ulcers?  Constitutional:  []  Fever or chills?  Ear/Nose/Throat:  []  Change in hearing? []  Nose bleeds? []  Sore throat?  Musculoskeletal:  []  Back pain? []  Joint pain? []  Muscle pain?   Physical Examination   Vitals:   11/04/23 0837  BP: 119/69  Pulse: 64  Temp: 97.8 F (36.6 C)  TempSrc: Temporal  Weight: 135 lb 12.8 oz (61.6 kg)   Body mass index is 22.6 kg/m.  General:  WDWN in NAD; vital signs documented above Gait: Not observed HENT: WNL, normocephalic Pulmonary: normal non-labored breathing , without rales, rhonchi,  wheezing Cardiac: Regular Abdomen: soft, NT, no masses Skin: without rashes Vascular Exam/Pulses: Palpable DP pulses bilaterally Extremities: without ischemic changes,  without gangrene , without cellulitis; without open wounds;  Musculoskeletal: no muscle wasting or atrophy  Neurologic: A&O X 3;  No focal weakness or paresthesias are detected Psychiatric:  The pt has Normal affect.  Non-Invasive Vascular imaging   ABI (11/04/2023) R:  ABI: 0.99 (1.1),  PT: tri DP: tri TBI: 0.6 L:  ABI: 0.9 (0.95),  PT: tri DP: tri TBI: 0.67  Aortoiliac Duplex (11/04/2023) Patent right common iliac artery stent without stenosis.  Unchanged 50 to 74% stenosis distal to the stent  Medical Decision Making   Megan Velasquez is a 63 y.o. female who presents for surveillance of PAD  Based on the patient's vascular studies, her ABIs are essentially unchanged since her last visit.  Her right ABI is 0.99 and left ABI is 0.9 Arterial duplex demonstrates a patent right common iliac artery stent without stenosis.  There is unchanged 50 to 74% stenosis distal to the stent On exam she has palpable femoral and DP pulses bilaterally.  She has no tissue loss She denies any rest pain, claudication, or tissue loss She can follow-up with our office in 9 months with repeat right aortoiliac duplex and ABIs   Ahmed Holster PA-C [facility] Office: [phone]  Call MD: Gretta

## 2023-11-26 ENCOUNTER — Other Ambulatory Visit: Payer: Self-pay | Admitting: Hematology

## 2023-11-26 DIAGNOSIS — C9000 Multiple myeloma not having achieved remission: Secondary | ICD-10-CM

## 2023-11-29 ENCOUNTER — Encounter: Payer: Self-pay | Admitting: Hematology

## 2023-12-20 ENCOUNTER — Other Ambulatory Visit: Payer: Self-pay

## 2023-12-20 ENCOUNTER — Encounter: Payer: Self-pay | Admitting: Gastroenterology

## 2023-12-20 DIAGNOSIS — C9000 Multiple myeloma not having achieved remission: Secondary | ICD-10-CM

## 2023-12-27 ENCOUNTER — Other Ambulatory Visit: Payer: Self-pay | Admitting: Hematology

## 2023-12-27 DIAGNOSIS — C9000 Multiple myeloma not having achieved remission: Secondary | ICD-10-CM

## 2023-12-28 ENCOUNTER — Inpatient Hospital Stay: Admitting: Hematology

## 2023-12-28 ENCOUNTER — Inpatient Hospital Stay: Attending: Hematology

## 2023-12-28 VITALS — BP 129/67 | HR 75 | Temp 97.5°F | Resp 20 | Wt 136.6 lb

## 2023-12-28 DIAGNOSIS — C9001 Multiple myeloma in remission: Secondary | ICD-10-CM

## 2023-12-28 DIAGNOSIS — D649 Anemia, unspecified: Secondary | ICD-10-CM | POA: Insufficient documentation

## 2023-12-28 DIAGNOSIS — Z87891 Personal history of nicotine dependence: Secondary | ICD-10-CM | POA: Diagnosis not present

## 2023-12-28 DIAGNOSIS — C9000 Multiple myeloma not having achieved remission: Secondary | ICD-10-CM | POA: Diagnosis present

## 2023-12-28 LAB — CBC WITH DIFFERENTIAL (CANCER CENTER ONLY)
Abs Immature Granulocytes: 0.02 K/uL (ref 0.00–0.07)
Basophils Absolute: 0 K/uL (ref 0.0–0.1)
Basophils Relative: 1 %
Eosinophils Absolute: 0.3 K/uL (ref 0.0–0.5)
Eosinophils Relative: 10 %
HCT: 33.4 % — ABNORMAL LOW (ref 36.0–46.0)
Hemoglobin: 11.3 g/dL — ABNORMAL LOW (ref 12.0–15.0)
Immature Granulocytes: 1 %
Lymphocytes Relative: 20 %
Lymphs Abs: 0.6 K/uL — ABNORMAL LOW (ref 0.7–4.0)
MCH: 31.4 pg (ref 26.0–34.0)
MCHC: 33.8 g/dL (ref 30.0–36.0)
MCV: 92.8 fL (ref 80.0–100.0)
Monocytes Absolute: 0.5 K/uL (ref 0.1–1.0)
Monocytes Relative: 16 %
Neutro Abs: 1.7 K/uL (ref 1.7–7.7)
Neutrophils Relative %: 52 %
Platelet Count: 144 K/uL — ABNORMAL LOW (ref 150–400)
RBC: 3.6 MIL/uL — ABNORMAL LOW (ref 3.87–5.11)
RDW: 14.2 % (ref 11.5–15.5)
WBC Count: 3.1 K/uL — ABNORMAL LOW (ref 4.0–10.5)
nRBC: 0 % (ref 0.0–0.2)

## 2023-12-28 LAB — IRON AND IRON BINDING CAPACITY (CC-WL,HP ONLY)
Iron: 57 ug/dL (ref 28–170)
Saturation Ratios: 17 % (ref 10.4–31.8)
TIBC: 342 ug/dL (ref 250–450)
UIBC: 285 ug/dL (ref 148–442)

## 2023-12-28 LAB — CMP (CANCER CENTER ONLY)
ALT: 16 U/L (ref 0–44)
AST: 14 U/L — ABNORMAL LOW (ref 15–41)
Albumin: 3.9 g/dL (ref 3.5–5.0)
Alkaline Phosphatase: 70 U/L (ref 38–126)
Anion gap: 7 (ref 5–15)
BUN: 21 mg/dL (ref 8–23)
CO2: 26 mmol/L (ref 22–32)
Calcium: 8.9 mg/dL (ref 8.9–10.3)
Chloride: 102 mmol/L (ref 98–111)
Creatinine: 1.14 mg/dL — ABNORMAL HIGH (ref 0.44–1.00)
GFR, Estimated: 54 mL/min — ABNORMAL LOW (ref >60)
Glucose, Bld: 95 mg/dL (ref 70–99)
Potassium: 3.9 mmol/L (ref 3.5–5.1)
Sodium: 135 mmol/L (ref 135–145)
Total Bilirubin: 0.6 mg/dL (ref 0.0–1.2)
Total Protein: 6.2 g/dL — ABNORMAL LOW (ref 6.5–8.1)

## 2023-12-28 LAB — VITAMIN B12: Vitamin B-12: 1557 pg/mL — ABNORMAL HIGH (ref 180–914)

## 2023-12-28 LAB — FERRITIN: Ferritin: 426 ng/mL — ABNORMAL HIGH (ref 11–307)

## 2023-12-28 MED ORDER — AZITHROMYCIN 250 MG PO TABS: ORAL_TABLET | ORAL | 0 refills | Status: AC

## 2023-12-29 ENCOUNTER — Encounter: Payer: Self-pay | Admitting: Hematology

## 2023-12-31 LAB — MULTIPLE MYELOMA PANEL, SERUM
Albumin SerPl Elph-Mcnc: 3.4 g/dL (ref 2.9–4.4)
Albumin/Glob SerPl: 1.5 (ref 0.7–1.7)
Alpha 1: 0.2 g/dL (ref 0.0–0.4)
Alpha2 Glob SerPl Elph-Mcnc: 0.9 g/dL (ref 0.4–1.0)
B-Globulin SerPl Elph-Mcnc: 0.9 g/dL (ref 0.7–1.3)
Gamma Glob SerPl Elph-Mcnc: 0.4 g/dL (ref 0.4–1.8)
Globulin, Total: 2.4 g/dL (ref 2.2–3.9)
IgA: 108 mg/dL (ref 87–352)
IgG (Immunoglobin G), Serum: 507 mg/dL — ABNORMAL LOW (ref 586–1602)
IgM (Immunoglobulin M), Srm: 23 mg/dL — ABNORMAL LOW (ref 26–217)
Total Protein ELP: 5.8 g/dL — ABNORMAL LOW (ref 6.0–8.5)

## 2024-01-05 ENCOUNTER — Encounter: Payer: Self-pay | Admitting: Hematology

## 2024-01-05 NOTE — Progress Notes (Signed)
 HEMATOLOGY/ONCOLOGY CLINIC NOTE  Date of Service: .12/28/2023  Patient Care Team: Almeda Loa ORN, FNP as PCP - General (Family Medicine) Alvan, Dorn FALCON, MD as PCP - Cardiology (Cardiology) Almeda Loa ORN, FNP as PCP - Family Medicine (Family Medicine) Shaaron, Lamar HERO, MD as Consulting Physician (Gastroenterology) Onesimo Emaline Brink, MD as Consulting Physician (Hematology)  CHIEF COMPLAINTS/PURPOSE OF CONSULTATION:  Continued follow-up for evaluation management of multiple myeloma  HISTORY OF PRESENTING ILLNESS:  Please see previous note for details on initial presentation  INTERVAL HISTORY  Megan Velasquez is a 63 y.o. female is here for continued valuation management of multiple myeloma. She notes that she is tolerating her Revlimid  but has been having some diarrhea which is controlled with as needed use of Imodium. No new focal pains. No fevers no chills no night sweats no unexpected weight loss. No new infection issues. No other new medications since her last clinic visit.  MEDICAL HISTORY:  Past Medical History:  Diagnosis Date   Cancer (HCC)    GERD (gastroesophageal reflux disease)    History of kidney stones    HTN (hypertension)    Multiple myeloma (HCC)    PAD (peripheral artery disease)    Renal disorder     SURGICAL HISTORY: Past Surgical History:  Procedure Laterality Date   ABDOMINAL AORTOGRAM W/LOWER EXTREMITY N/A 12/24/2021   Procedure: ABDOMINAL AORTOGRAM W/LOWER EXTREMITY;  Surgeon: Lanis Fonda BRAVO, MD;  Location: [facility] INVASIVE CV LAB;  Service: Cardiovascular;  Laterality: N/A;   CHOLECYSTECTOMY     COLONOSCOPY  01/2015   Dr. Donnel: Mild diverticulosis, sessile polyp ranging 3 to 5 mm removed from the proximal transverse colon, semi-pedunculated polyp 5 to 9 mm in size removed from the sigmoid colon.  Sigmoid colon polyp was serrated adenoma, transverse colon polyp was adenomatous.  Patient was told to have another colonoscopy in 3 years.    COLONOSCOPY WITH PROPOFOL  N/A 10/11/2017   Procedure: COLONOSCOPY WITH PROPOFOL ;  Surgeon: Shaaron Lamar HERO, MD;  Location: [facility];  Service: Endoscopy;  Laterality: N/A;  1:15pm   COLONOSCOPY WITH PROPOFOL  N/A 01/06/2023   Procedure: COLONOSCOPY WITH PROPOFOL ;  Surgeon: Shaaron Lamar HERO, MD;  Location: [facility];  Service: Endoscopy;  Laterality: N/A;  1:15pm, asa 3   IR IMAGING GUIDED PORT INSERTION  06/05/2020   PERIPHERAL VASCULAR INTERVENTION  12/24/2021   Procedure: PERIPHERAL VASCULAR INTERVENTION;  Surgeon: Lanis Fonda BRAVO, MD;  Location: [facility] INVASIVE CV LAB;  Service: Cardiovascular;;   POLYPECTOMY  10/11/2017   Procedure: POLYPECTOMY;  Surgeon: Shaaron Lamar HERO, MD;  Location: [facility];  Service: Endoscopy;;  descending colon polyps cs times 2    SOCIAL HISTORY: Social History   Socioeconomic History   Marital status: Married    Spouse name: Not on file   Number of children: Not on file   Years of education: Not on file   Highest education level: Not on file  Occupational History   Not on file  Tobacco Use   Smoking status: Former    Current packs/day: 0.00    Average packs/day: 1 pack/day for 28.0 years (28.0 ttl pk-yrs)    Types: Cigarettes    Start date: 10/08/1985    Quit date: 10/08/2013    Years since quitting: 10.2   Smokeless tobacco: Never  Vaping Use   Vaping status: Never Used  Substance and Sexual Activity   Alcohol use: Not Currently   Drug use: Never   Sexual activity: Yes  Birth control/protection: Post-menopausal  Other Topics Concern   Not on file  Social History Narrative   Not on file   Social Drivers of Health   Financial Resource Strain: Low Risk  (09/13/2020)   Received from [facility]   Overall Financial Resource Strain (CARDIA)    Difficulty of Paying Living Expenses: Not hard at all  Food Insecurity: No Food Insecurity (09/13/2020)   Received from [facility]   Hunger Vital Sign    Within the past 12 months, you worried  that your food would run out before you got the money to buy more.: Never true    Within the past 12 months, the food you bought just didn't last and you didn't have money to get more.: Never true  Transportation Needs: No Transportation Needs (09/13/2020)   Received from [facility] - Transportation    Lack of Transportation (Medical): No    Lack of Transportation (Non-Medical): No  Physical Activity: Inactive (12/22/2022)   Received from [facility]   Exercise Vital Sign    On average, how many days per week do you engage in moderate to strenuous exercise (like a brisk walk)?: 0 days    On average, how many minutes do you engage in exercise at this level?: 0 min  Stress: No Stress Concern Present (12/22/2022)   Received from [facility] - Occupational Stress Questionnaire    Feeling of Stress : Not at all  Social Connections: Unknown (06/06/2020)   Social Connection and Isolation Panel    Frequency of Communication with Friends and Family: More than three times a week    Frequency of Social Gatherings with Friends and Family: More than three times a week    Attends Religious Services: Not on file    Active Member of Clubs or Organizations: Not on file    Attends Banker Meetings: Not on file    Marital Status: Married  Intimate Partner Violence: Not At Risk (12/22/2022)   Received from [facility]   Humiliation, Afraid, Rape, and Kick questionnaire    Within the last year, have you been afraid of your partner or ex-partner?: No    Within the last year, have you been humiliated or emotionally abused in other ways by your partner or ex-partner?: No    Within the last year, have you been kicked, hit, slapped, or otherwise physically hurt by your partner or ex-partner?: No    Within the last year, have you been raped or forced to have any kind of sexual activity by your partner or ex-partner?: No    FAMILY  HISTORY: Family History  Problem Relation Age of Onset   Heart disease Mother    Diabetes Mother    Lung cancer Father    COPD Father    Colon cancer Neg Hx     ALLERGIES:  has no known allergies.  MEDICATIONS:  Current Outpatient Medications  Medication Sig Dispense Refill   azithromycin  (ZITHROMAX  Z-PAK) 250 MG tablet 2 tabs (500mg ) on Day 1 and then 1 tab (250mg ) daily po from Day 2 to 5 6 each 0   acetaminophen  (TYLENOL ) 500 MG tablet Take 1,000 mg by mouth every 6 (six) hours as needed for fever or headache.     ADVAIR HFA 230-21 MCG/ACT inhaler Inhale 2 puffs into the lungs 2 (two) times daily.     Ascorbic Acid  (VITAMIN C) 1000 MG  tablet Take 1,000 mg by mouth every morning.     b complex vitamins capsule Take 1 capsule by mouth daily. (Patient taking differently: Take 1 capsule by mouth every morning.) 30 capsule 3   Cholecalciferol  (VITAMIN D3) 5000 units CAPS Take 5,000 Units by mouth every morning.     clopidogrel  (PLAVIX ) 75 MG tablet Take 1 tablet by mouth once daily 30 tablet 11   Cyanocobalamin  (VITAMIN B12) 3000 MCG SUBL Place 3,000 mcg under the tongue every morning.     ELIQUIS  2.5 MG TABS tablet Take 1 tablet by mouth twice daily 60 tablet 0   famotidine  (PEPCID ) 20 MG tablet TAKE 1 TABLET BY MOUTH AT BEDTIME 30 tablet 11   lenalidomide  (REVLIMID ) 10 MG capsule TAKE 1 CAPSULE BY MOUTH DAILY FOR 21 DAYS, THEN TAKE 7 DAYS OFF 21 capsule 0   lisinopril -hydrochlorothiazide  (ZESTORETIC ) 10-12.5 MG tablet Take 1 tablet by mouth daily.     loratadine  (CLARITIN ) 10 MG tablet Take 10 mg by mouth every morning.     omeprazole (PRILOSEC) 40 MG capsule Take 40 mg by mouth daily as needed (acid reflux).     ondansetron  (ZOFRAN ) 8 MG tablet Take 1 tablet (8 mg total) by mouth 2 (two) times daily as needed (Nausea or vomiting). 30 tablet 1   rosuvastatin (CRESTOR) 10 MG tablet Take 10 mg by mouth daily.     No current facility-administered medications for this visit.    REVIEW  OF SYSTEMS:    10 Point review of Systems was done is negative except as noted above.  PHYSICAL EXAMINATION:  ECOG PERFORMANCE STATUS: 1 - Symptomatic but completely ambulatory .BP 129/67   Pulse 75   Temp (!) 97.5 F (36.4 C)   Resp 20   Wt 136 lb 9.6 oz (62 kg)   SpO2 97%   BMI 22.73 kg/m  NAD GENERAL:alert, in no acute distress and comfortable SKIN: no acute rashes, no significant lesions EYES: conjunctiva are pink and non-injected, sclera anicteric OROPHARYNX: MMM, no exudates, no oropharyngeal erythema or ulceration NECK: supple, no JVD LYMPH:  no palpable lymphadenopathy in the cervical, axillary or inguinal regions LUNGS: clear to auscultation b/l with normal respiratory effort HEART: regular rate & rhythm ABDOMEN:  normoactive bowel sounds , non tender, not distended. Extremity: no pedal edema PSYCH: alert & oriented x 3 with fluent speech NEURO: no focal motor/sensory deficits   LABORATORY DATA:  I have reviewed the data as listed  .    Latest Ref Rng & Units 12/28/2023    8:17 AM 10/05/2023    9:15 AM 08/10/2023    8:33 AM  CBC  WBC 4.0 - 10.5 K/uL 3.1  3.6  3.6   Hemoglobin 12.0 - 15.0 g/dL 88.6  87.7  87.5   Hematocrit 36.0 - 46.0 % 33.4  35.7  35.1   Platelets 150 - 400 K/uL 144  128  111     .    Latest Ref Rng & Units 12/28/2023    8:17 AM 10/05/2023    9:15 AM 08/10/2023    8:33 AM  CMP  Glucose 70 - 99 mg/dL 95  90  872   BUN 8 - 23 mg/dL 21  22  21    Creatinine 0.44 - 1.00 mg/dL 8.85  8.90  8.92   Sodium 135 - 145 mmol/L 135  138  138   Potassium 3.5 - 5.1 mmol/L 3.9  4.1  3.8   Chloride 98 - 111 mmol/L 102  102  102   CO2 22 - 32 mmol/L 26  29  29    Calcium 8.9 - 10.3 mg/dL 8.9  9.2  8.9   Total Protein 6.5 - 8.1 g/dL 6.2  6.4  6.4   Total Bilirubin 0.0 - 1.2 mg/dL 0.6  0.7  0.9   Alkaline Phos 38 - 126 U/L 70  66  68   AST 15 - 41 U/L 14  16  16    ALT 0 - 44 U/L 16  17  15       Most recent lab results (03/15/2020) of CBC is as follows:  all values are WNL except for RBC at 3.66, Hgb at 9.7, HCT at 31.8, MCHC at 30.5, RDW Standard Deviation 58.7, Red Cell Distribution Width At 18.6,  BUN at 19, Creatinine at 1.06, Total Protein at 10.2, Albumin at 2.7, AST at 14,. 03/13/2020 UPEP shows no M-Spike, all values are WNL 03/06/2020 SPEP shows Total Protein at 10.1, Beta Globulin at 5.2, M-Spike at 4.3, Total Globulin at 6.4, A/G Ratio at 0.6 03/06/2020 Free Kappa Light Chains at 181.1, Free Lambda Light Chains Serum at 3.6, K/L ratio at 50.31 03/06/2020 Beta-2  Microglobulin at 2.5           RADIOGRAPHIC STUDIES: I have personally reviewed the radiological images as listed and agreed with the findings in the report. No results found.  ASSESSMENT & PLAN:   63 year old female here for continued follow-up, evaluation and management of multiple myeloma  #1  IgA kappa multiple myeloma status post induction treatment with Carfilzomib  Revlimid  dexamethasone   presenting with anemia hemoglobin 9.4. Creatinine 1.28 No hypercalcemia Bone survey with no obvious skeletal lesions. Baseline M spike of 4.3 g/dL. Beta-2  Microglobulin at 2.5 Mol Cy Monosomy 13 and dup 1q  2) status post stem cell collection assisted with G-CSF and Plerixafor. Currently on maintenance Revlimid . Currently had declined proceeding with consolidative autologous HSCT  3) Diarrhea - ? Related to Revlimid .  Mild and nearly resolved GI panel and c diff neg Role of as needed Imodium PLAN: Discussed patient's lab results from today at 12/28/2023 CBC shows hemoglobin of 11.3, WBC count of 3.1k and platelets of 144k CMP shows stable creatinine of 1.14 normal calcium of 8.9 Myeloma panel shows no detectable M spike and negative IFE suggesting patient continues to be in remission B12 adequate at 1557 Ferritin is adequate at 426 with an iron saturation of 17% No indication for p.o. iron at this time No indication for myeloma progression at this time She will  continue her maintenance Revlimid  10 mg p.o. daily 3 weeks on 1 week off As needed Imodium for grade 1-2 diarrhea If diarrhea is persistent or worsening discussed with patient that we could reduce her dose of Revlimid .  She prefers to continue the current dose at this time.  FOLLOW-UP: RTC with Dr Onesimo with labs in 12 weeks   The total time spent in the appointment was 30 minutes*.  All of the patient's questions were answered with apparent satisfaction. The patient knows to call the clinic with any problems, questions or concerns.   Emaline Onesimo MD MS AAHIVMS [facility] Hematology/Oncology Physician [facility]  .*Total Encounter Time as defined by the Centers for Medicare and Medicaid Services includes, in addition to the face-to-face time of a patient visit (documented in the note above) non-face-to-face time: obtaining and reviewing outside history, ordering and reviewing medications, tests or procedures, care coordination (communications with other health care professionals or caregivers) and documentation  in the medical record.

## 2024-01-18 ENCOUNTER — Other Ambulatory Visit: Payer: Self-pay | Admitting: Hematology

## 2024-01-18 ENCOUNTER — Inpatient Hospital Stay: Admitting: Hematology

## 2024-01-18 DIAGNOSIS — R0602 Shortness of breath: Secondary | ICD-10-CM

## 2024-01-18 NOTE — Progress Notes (Signed)
 Patient's diarrhea better.  She has restarted her Revlimid . Will get an echo in about a month to evaluate some mild shortness of breath which is improved. Will see him back in 2 months with repeat labs.

## 2024-01-21 ENCOUNTER — Other Ambulatory Visit: Payer: Self-pay

## 2024-01-21 DIAGNOSIS — C9000 Multiple myeloma not having achieved remission: Secondary | ICD-10-CM

## 2024-01-21 MED ORDER — LENALIDOMIDE 10 MG PO CAPS: ORAL_CAPSULE | ORAL | 0 refills | Status: DC

## 2024-01-31 ENCOUNTER — Ambulatory Visit: Attending: Cardiology

## 2024-01-31 DIAGNOSIS — R0602 Shortness of breath: Secondary | ICD-10-CM | POA: Diagnosis not present

## 2024-01-31 LAB — ECHOCARDIOGRAM COMPLETE
AR max vel: 3.56 cm2
AV Peak grad: 7.6 mmHg
Ao pk vel: 1.38 m/s
Area-P 1/2: 2.43 cm2
Calc EF: 65.2 %
S' Lateral: 2.5 cm
Single Plane A2C EF: 70.5 %
Single Plane A4C EF: 64.8 %

## 2024-02-05 ENCOUNTER — Other Ambulatory Visit: Payer: Self-pay | Admitting: Gastroenterology

## 2024-02-05 DIAGNOSIS — R110 Nausea: Secondary | ICD-10-CM

## 2024-02-09 ENCOUNTER — Telehealth: Payer: Self-pay | Admitting: Cardiology

## 2024-02-09 NOTE — Telephone Encounter (Signed)
 Called patient advised her that she will need to reach out to her Oncologist Dr. Onesimo for results. Patient verbalized understanding.

## 2024-02-09 NOTE — Telephone Encounter (Signed)
 Pt calling for results of Echo please advise

## 2024-02-15 ENCOUNTER — Telehealth: Payer: Self-pay | Admitting: *Deleted

## 2024-02-15 ENCOUNTER — Other Ambulatory Visit (INDEPENDENT_AMBULATORY_CARE_PROVIDER_SITE_OTHER): Payer: Self-pay | Admitting: Gastroenterology

## 2024-02-15 DIAGNOSIS — R110 Nausea: Secondary | ICD-10-CM

## 2024-02-15 MED ORDER — FAMOTIDINE 20 MG PO TABS: 20.0000 mg | ORAL_TABLET | Freq: Every day | ORAL | 0 refills | Status: AC

## 2024-02-15 NOTE — Telephone Encounter (Signed)
 Received refill request for famotidine  20mg . Pt last OV 01/29/2023

## 2024-02-16 NOTE — Telephone Encounter (Signed)
Noted. Informed pt.  

## 2024-02-25 ENCOUNTER — Other Ambulatory Visit: Payer: Self-pay

## 2024-02-25 DIAGNOSIS — C9000 Multiple myeloma not having achieved remission: Secondary | ICD-10-CM

## 2024-02-28 ENCOUNTER — Inpatient Hospital Stay: Admitting: Hematology

## 2024-02-28 ENCOUNTER — Inpatient Hospital Stay: Attending: Hematology

## 2024-02-28 VITALS — BP 126/71 | HR 71 | Temp 97.3°F | Resp 20 | Wt 140.2 lb

## 2024-02-28 DIAGNOSIS — R5383 Other fatigue: Secondary | ICD-10-CM | POA: Diagnosis not present

## 2024-02-28 DIAGNOSIS — R197 Diarrhea, unspecified: Secondary | ICD-10-CM | POA: Diagnosis not present

## 2024-02-28 DIAGNOSIS — D649 Anemia, unspecified: Secondary | ICD-10-CM | POA: Diagnosis not present

## 2024-02-28 DIAGNOSIS — C9000 Multiple myeloma not having achieved remission: Secondary | ICD-10-CM | POA: Diagnosis present

## 2024-02-28 DIAGNOSIS — Z7961 Long term (current) use of immunomodulator: Secondary | ICD-10-CM | POA: Insufficient documentation

## 2024-02-28 DIAGNOSIS — R202 Paresthesia of skin: Secondary | ICD-10-CM | POA: Insufficient documentation

## 2024-02-28 DIAGNOSIS — Z87891 Personal history of nicotine dependence: Secondary | ICD-10-CM | POA: Insufficient documentation

## 2024-02-28 LAB — CBC WITH DIFFERENTIAL (CANCER CENTER ONLY)
Abs Immature Granulocytes: 0.01 K/uL (ref 0.00–0.07)
Basophils Absolute: 0.1 K/uL (ref 0.0–0.1)
Basophils Relative: 2 %
Eosinophils Absolute: 0.1 K/uL (ref 0.0–0.5)
Eosinophils Relative: 2 %
HCT: 36.4 % (ref 36.0–46.0)
Hemoglobin: 12.5 g/dL (ref 12.0–15.0)
Immature Granulocytes: 0 %
Lymphocytes Relative: 23 %
Lymphs Abs: 0.8 K/uL (ref 0.7–4.0)
MCH: 31.7 pg (ref 26.0–34.0)
MCHC: 34.3 g/dL (ref 30.0–36.0)
MCV: 92.4 fL (ref 80.0–100.0)
Monocytes Absolute: 0.5 K/uL (ref 0.1–1.0)
Monocytes Relative: 14 %
Neutro Abs: 2.1 K/uL (ref 1.7–7.7)
Neutrophils Relative %: 59 %
Platelet Count: 177 K/uL (ref 150–400)
RBC: 3.94 MIL/uL (ref 3.87–5.11)
RDW: 13.2 % (ref 11.5–15.5)
WBC Count: 3.6 K/uL — ABNORMAL LOW (ref 4.0–10.5)
nRBC: 0 % (ref 0.0–0.2)

## 2024-02-28 LAB — CMP (CANCER CENTER ONLY)
ALT: 21 U/L (ref 0–44)
AST: 26 U/L (ref 15–41)
Albumin: 4.2 g/dL (ref 3.5–5.0)
Alkaline Phosphatase: 77 U/L (ref 38–126)
Anion gap: 10 (ref 5–15)
BUN: 17 mg/dL (ref 8–23)
CO2: 26 mmol/L (ref 22–32)
Calcium: 9.1 mg/dL (ref 8.9–10.3)
Chloride: 104 mmol/L (ref 98–111)
Creatinine: 1.05 mg/dL — ABNORMAL HIGH (ref 0.44–1.00)
GFR, Estimated: 59 mL/min — ABNORMAL LOW (ref >60)
Glucose, Bld: 107 mg/dL — ABNORMAL HIGH (ref 70–99)
Potassium: 4.1 mmol/L (ref 3.5–5.1)
Sodium: 141 mmol/L (ref 135–145)
Total Bilirubin: 0.5 mg/dL (ref 0.0–1.2)
Total Protein: 6.5 g/dL (ref 6.5–8.1)

## 2024-02-28 LAB — IRON AND IRON BINDING CAPACITY (CC-WL,HP ONLY)
Iron: 64 ug/dL (ref 28–170)
Saturation Ratios: 18 % (ref 10.4–31.8)
TIBC: 350 ug/dL (ref 250–450)
UIBC: 287 ug/dL

## 2024-02-28 LAB — FERRITIN: Ferritin: 223 ng/mL (ref 11–307)

## 2024-02-28 LAB — VITAMIN B12: Vitamin B-12: 1097 pg/mL — ABNORMAL HIGH (ref 180–914)

## 2024-02-28 NOTE — Progress Notes (Signed)
 HEMATOLOGY ONCOLOGY PROGRESS NOTE  Date of service: 02/28/2024  Patient Care Team: Almeda Loa ORN, FNP as PCP - General (Family Medicine) Alvan, Dorn FALCON, MD as PCP - Cardiology (Cardiology) Almeda Loa ORN, FNP as PCP - Family Medicine (Family Medicine) Shaaron, Lamar HERO, MD as Consulting Physician (Gastroenterology) Onesimo Emaline Brink, MD as Consulting Physician (Hematology)  CHIEF COMPLAINT/PURPOSE OF CONSULTATION: Follow-up for continued evaluation and management of multiple myeloma   HISTORY OF PRESENTING ILLNESS: (04/16/2020) Megan Velasquez is a wonderful 63 y.o. female who has been referred to us  for evaluation and management of multiple myeloma. Pt is accompanied today by her husband for this visit.    Patient was referred to us  for second opinion regarding management of newly diagnosed multiple myeloma by Dr. Burgess Fusi MD.   Patient had been referred to to [facility] cancer center by her primary care physician for anemia and elevated serum protein levels.  She also had some increasing fatigue and tingling in her hands and feet.  Noted primarily in the right wrist and right ankle.  B12 levels were noted to be borderline low.   She had a work-up including labs which showed hemoglobin of 9.7 with an MCV of 86.9 WBC count of 6.95k and platelets of 165k. CMP showed a creatinine of 1.06 calcium level of 8.9 total protein of 10.2 with an albumin of 2.7 normal liver function tests and other electrolytes. Serum protein electrophoresis with an M spike of 4.3 with an additional M spike of 0.2 g/dL.  IgA quantitative more than 6400. B12 levels 276, homocystine 15.9 Serum folate 26.5 LDH 96 Beta-2  microglobulin of 2.5   Serum kappa lambda free light chains showed free kappa light chains of 181, free lambda light chains of 3.6 and a free kappa lambda ratio of 50.3.   24-hour UPEP showed no M spike and a total protein of 5.7 mg/dL.   Bone Survey completed on 03/08/2020 with results revealing  No evidence of lytic nor blastic lesions within the appendicular or axial skeleton. Degenerative changes as described above.    On review of systems, pt reports some mild fatigue.  No focal bone pains.  No chest pain.  No shortness of breath. No evidence of GI bleeding no hematuria no nosebleeds or gum bleeds. No other acute new focal symptoms. Denies any history of known cardiopulmonary disease strokes or heart attacks.   SUMMARY OF ONCOLOGIC HISTORY: Oncology History  Multiple myeloma not having achieved remission (HCC)  05/20/2020 Cancer Staging   Staging form: Plasma Cell Myeloma and Plasma Cell Disorders, AJCC 8th Edition - Clinical stage from 05/20/2020: RISS Stage II (Beta-2 -microglobulin (mg/L): 2, Albumin (g/dL): 3, ISS: Stage II, High-risk cytogenetics: Absent, LDH: Normal) - Signed by Onesimo Emaline Brink, MD on 12/23/2021 Stage prefix: Initial diagnosis Beta 2 microglobulin range (mg/L): Less than 3.5 Albumin range (g/dL): Less than 3.5 Cytogenetics: 1p deletion Lactate dehydrogenase (LDH) (U/L): 71 Serum calcium level: Normal Creatinine (mg/dL): 1.3   7/75/7977 Initial Diagnosis   Multiple myeloma not having achieved remission (HCC)   06/06/2020 - 01/03/2021 Chemotherapy   Patient is on Treatment Plan : MYELOMA NEWLY DIAGNOSED TRANSPLANT CANDIDATE Carfilzomib  (20/36)/ Lenalidomide  / Dexamethasone  (40/20) (KRd) q28d      INTERVAL HISTORY: Megan Velasquez is a 63 y.o. female who is here today for continued evaluation and management of multiple myeloma . accompanied by husband   she was last seen by me on 12/28/2023; at the time she mentioned experiencing diarrhea, which she was managing with Imodium.  Today, she says that she feels significantly improved from experiencing Covid-like symptoms for several months.   She reports that she is still experiencing diarrhea with her Revlimid , managed with Imodium. She says that she uses Imodium when she's had more than 3 bowel  movements. Notes that when she stopped Revlimid  during an illness her diarrhea resolved, she does still experience on her week off and diarrhea occasionally worsens around the 3-week mark.  Denies any back pain, abdominal pain, or new lumps/bumps.  REVIEW OF SYSTEMS:   10 Point review of systems of done and is negative except as noted above.  MEDICAL HISTORY Past Medical History:  Diagnosis Date   Cancer [facility])    GERD (gastroesophageal reflux disease)    History of kidney stones    HTN (hypertension)    Multiple myeloma (HCC)    PAD (peripheral artery disease)    Renal disorder     SURGICAL HISTORY Past Surgical History:  Procedure Laterality Date   ABDOMINAL AORTOGRAM W/LOWER EXTREMITY N/A 12/24/2021   Procedure: ABDOMINAL AORTOGRAM W/LOWER EXTREMITY;  Surgeon: Lanis Fonda BRAVO, MD;  Location[facility] INVASIVE CV LAB;  Service: Cardiovascular;  Laterality: N/A;   CHOLECYSTECTOMY     COLONOSCOPY  01/2015   Dr. Donnel: Mild diverticulosis, sessile polyp ranging 3 to 5 mm removed from the proximal transverse colon, semi-pedunculated polyp 5 to 9 mm in size removed from the sigmoid colon.  Sigmoid colon polyp was serrated adenoma, transverse colon polyp was adenomatous.  Patient was told to have another colonoscopy in 3 years.   COLONOSCOPY WITH PROPOFOL  N/A 10/11/2017   Procedure: COLONOSCOPY WITH PROPOFOL ;  Surgeon: Shaaron Lamar HERO, MD;  Location: [facility] ENDO [facility];  Service: Endoscopy;  Laterality: N/A;  1:15pm   COLONOSCOPY WITH PROPOFOL  N/A 01/06/2023   Procedure: COLONOSCOPY WITH PROPOFOL ;  Surgeon: Shaaron Lamar HERO, MD;  Location: [facility] ENDO SUITE;  Service: Endoscopy;  Laterality: N/A;  1:15pm, asa 3   IR IMAGING GUIDED PORT INSERTION  06/05/2020   PERIPHERAL VASCULAR INTERVENTION  12/24/2021   Procedure: PERIPHERAL VASCULAR INTERVENTION;  Surgeon: Lanis Fonda BRAVO, MD;  Location: [facility] INVASIVE CV LAB;  Service: Cardiovascular;;   POLYPECTOMY  10/11/2017   Procedure: POLYPECTOMY;  Surgeon: Shaaron Lamar HERO, MD;  Location: [facility];  Service: Endoscopy;;  descending colon polyps cs times 2    SOCIAL HISTORY Social History   Tobacco Use   Smoking status: Former    Current packs/day: 0.00    Average packs/day: 1 pack/day for 28.0 years (28.0 ttl pk-yrs)    Types: Cigarettes    Start date: 10/08/1985    Quit date: 10/08/2013    Years since quitting: 10.3   Smokeless tobacco: Never  Vaping Use   Vaping status: Never Used  Substance Use Topics   Alcohol use: Not Currently   Drug use: Never    Social History   Social History Narrative   Not on file    SOCIAL DRIVERS OF HEALTH SDOH Screenings   Food Insecurity: No Food Insecurity (09/13/2020)   Received [facility]  Housing: Low Risk  (06/06/2020)  Transportation Needs: No Transportation Needs (09/13/2020)   Received from [facility]  Financial Resource Strain: Low Risk (09/13/2020)   Received from [facility]  Physical Activity: Inactive (12/22/2022)   Received from [facility]  Social Connections: Unknown (06/06/2020)  Stress: No Stress Concern Present (12/22/2022)   Received from [facility]  Tobacco Use: Medium Risk (11/04/2023)  Health Literacy:  Low Risk (12/22/2022)   Received from [facility]     FAMILY HISTORY Family History  Problem Relation Age of Onset   Heart disease Mother    Diabetes Mother    Lung cancer Father    COPD Father    Colon cancer Neg Hx      ALLERGIES: has no known allergies.  MEDICATIONS  Current Outpatient Medications  Medication Sig Dispense Refill   acetaminophen  (TYLENOL ) 500 MG tablet Take 1,000 mg by mouth every 6 (six) hours as needed for fever or headache.     ADVAIR HFA 230-21 MCG/ACT inhaler Inhale 2 puffs into the lungs 2 (two) times daily.     Ascorbic Acid  (VITAMIN C) 1000 MG tablet Take 1,000 mg by mouth every morning.     azithromycin  (ZITHROMAX  Z-PAK) 250 MG tablet 2 tabs (500mg ) on Day 1 and then 1 tab (250mg ) daily po from Day 2 to 5 6  each 0   b complex vitamins capsule Take 1 capsule by mouth daily. (Patient taking differently: Take 1 capsule by mouth every morning.) 30 capsule 3   Cholecalciferol  (VITAMIN D3) 5000 units CAPS Take 5,000 Units by mouth every morning.     clopidogrel  (PLAVIX ) 75 MG tablet Take 1 tablet by mouth once daily 30 tablet 11   Cyanocobalamin  (VITAMIN B12) 3000 MCG SUBL Place 3,000 mcg under the tongue every morning.     ELIQUIS  2.5 MG TABS tablet Take 1 tablet by mouth twice daily 60 tablet 0   famotidine  (PEPCID ) 20 MG tablet Take 1 tablet (20 mg total) by mouth at bedtime. 90 tablet 0   lenalidomide  (REVLIMID ) 10 MG capsule Take 1 capsule (10 mg total) by mouth daily for 21 days. Take 7 days off. Repeat cycle. 21 capsule 0   lisinopril -hydrochlorothiazide  (ZESTORETIC ) 10-12.5 MG tablet Take 1 tablet by mouth daily.     loratadine  (CLARITIN ) 10 MG tablet Take 10 mg by mouth every morning.     omeprazole (PRILOSEC) 40 MG capsule Take 40 mg by mouth daily as needed (acid reflux).     ondansetron  (ZOFRAN ) 8 MG tablet Take 1 tablet (8 mg total) by mouth 2 (two) times daily as needed (Nausea or vomiting). 30 tablet 1   rosuvastatin (CRESTOR) 10 MG tablet Take 10 mg by mouth daily.     No current facility-administered medications for this visit.    PHYSICAL EXAMINATION: ECOG PERFORMANCE STATUS: 1 - Symptomatic but completely ambulatory VITALS: Vitals:   02/28/24 0902  BP: 126/71  Pulse: 71  Resp: 20  Temp: (!) 97.3 F (36.3 C)  SpO2: 98%   Filed Weights   02/28/24 0902  Weight: 140 lb 3.2 oz (63.6 kg)   Body mass index is 23.33 kg/m.  GENERAL: alert, in no acute distress and comfortable SKIN: no acute rashes, no significant lesions EYES: conjunctiva are pink and non-injected, sclera anicteric OROPHARYNX: MMM, no exudates, no oropharyngeal erythema or ulceration NECK: supple, no JVD LYMPH:  no palpable lymphadenopathy in the cervical, axillary or inguinal regions LUNGS: clear to  auscultation b/l with normal respiratory effort HEART: regular rate & rhythm ABDOMEN:  normoactive bowel sounds , non tender, not distended, no hepatosplenomegaly Extremity: no pedal edema PSYCH: alert & oriented x 3 with fluent speech NEURO: no focal motor/sensory deficits  LABORATORY DATA:   I have reviewed the data as listed     Latest Ref Rng & Units 02/28/2024    8:07 AM 12/28/2023    8:17 AM 10/05/2023  9:15 AM  CBC EXTENDED  WBC 4.0 - 10.5 K/uL 3.6  3.1  3.6   RBC 3.87 - 5.11 MIL/uL 3.94  3.60  3.81   Hemoglobin 12.0 - 15.0 g/dL 87.4  88.6  87.7   HCT 36.0 - 46.0 % 36.4  33.4  35.7   Platelets 150 - 400 K/uL 177  144  128   NEUT# 1.7 - 7.7 K/uL 2.1  1.7  2.1   Lymph# 0.7 - 4.0 K/uL 0.8  0.6  0.9      Iron Studies:  Lab Results  Component Value Date   IRON 64 02/28/2024   UIBC 287 02/28/2024   TIBC 350 02/28/2024   IRONPCTSAT 18 02/28/2024   FERRITIN 426 (H) 12/28/2023      Vitamin B12:  Lab Results  Component Value Date   VITAMINB12 1,557 (H) 12/28/2023        Latest Ref Rng & Units 02/28/2024    8:07 AM 12/28/2023    8:17 AM 10/05/2023    9:15 AM  CMP  Glucose 70 - 99 mg/dL 892  95  90   BUN 8 - 23 mg/dL 17  21  22    Creatinine 0.44 - 1.00 mg/dL 8.94  8.85  8.90   Sodium 135 - 145 mmol/L 141  135  138   Potassium 3.5 - 5.1 mmol/L 4.1  3.9  4.1   Chloride 98 - 111 mmol/L 104  102  102   CO2 22 - 32 mmol/L 26  26  29    Calcium 8.9 - 10.3 mg/dL 9.1  8.9  9.2   Total Protein 6.5 - 8.1 g/dL 6.5  6.2  6.4   Total Bilirubin 0.0 - 1.2 mg/dL 0.5  0.6  0.7   Alkaline Phos 38 - 126 U/L 77  70  66   AST 15 - 41 U/L 26  14  16    ALT 0 - 44 U/L 21  16  17     MULTIPLE MYELOMA 11/2022 - 12/2023     03/15/2020 CBC is as follows: all values are WNL except for RBC at 3.66, Hgb at 9.7, HCT at 31.8, MCHC at 30.5, RDW Standard Deviation 58.7, Red Cell Distribution Width At 18.6,  BUN at 19, Creatinine at 1.06, Total Protein at 10.2, Albumin at 2.7, AST at  14,. 03/13/2020 UPEP shows no M-Spike, all values are WNL 03/06/2020 SPEP shows Total Protein at 10.1, Beta Globulin at 5.2, M-Spike at 4.3, Total Globulin at 6.4, A/G Ratio at 0.6 03/06/2020 Free Kappa Light Chains at 181.1, Free Lambda Light Chains Serum at 3.6, K/L ratio at 50.31 03/06/2020 Beta-2  Microglobulin at 2.5      RADIOGRAPHIC STUDIES: I have personally reviewed the radiological images as listed and agreed with the findings in the report. ECHOCARDIOGRAM COMPLETE Result Date: 01/31/2024    ECHOCARDIOGRAM REPORT   Patient Name:   CARRINA SCHOENBERGER Date of Exam: 01/31/2024 Medical Rec #:  [id number]    Height:       65.0 in Accession #:    [id number]   Weight:       136.6 lb Date of Birth:  25-Sep-1960    BSA:          1.682 m Patient Age:    63 years     BP:           130/66 mmHg Patient Gender: F            HR:  69 bpm. Exam Location:  [place] Procedure: 2D Echo, Cardiac Doppler, Color Doppler and Strain Analysis (Both            Spectral and Color Flow Doppler were utilized during procedure). Indications:    R06.02 SOB  History:        Patient has prior history of Echocardiogram examinations, most                 recent 10/10/2021. Risk Factors:Hypertension, Dyslipidemia, Former                 Smoker and Family History of Coronary Artery Disease.  Sonographer:    Bascom Burows RCS, RVS Referring Phys: [id number] EMALINE CANDIDA SARAN IMPRESSIONS  1. Left ventricular ejection fraction, by estimation, is 65 to 70%. The left ventricle has normal function. The left ventricle has no regional wall motion abnormalities. Left ventricular diastolic parameters are consistent with Grade I diastolic dysfunction (impaired relaxation). The average left ventricular global longitudinal strain is -18.9 %. The global longitudinal strain is normal.  2. Right ventricular systolic function is normal. The right ventricular size is normal. Tricuspid regurgitation signal is inadequate for assessing PA pressure.  3. The  mitral valve is degenerative. Trivial mitral valve regurgitation.  4. The aortic valve is tricuspid. Aortic valve regurgitation is not visualized. No aortic stenosis is present.  5. The inferior vena cava is normal in size with greater than 50% respiratory variability, suggesting right atrial pressure of 3 mmHg. Comparison(s): A prior study was performed on 10/10/2021. Prior images reviewed side by side. LVEF 65-70%. FINDINGS  Left Ventricle: Left ventricular ejection fraction, by estimation, is 65 to 70%. The left ventricle has normal function. The left ventricle has no regional wall motion abnormalities. The average left ventricular global longitudinal strain is -18.9 %. Strain was performed and the global longitudinal strain is normal. The left ventricular internal cavity size was normal in size. There is no left ventricular hypertrophy. Left ventricular diastolic parameters are consistent with Grade I diastolic dysfunction (impaired relaxation). Right Ventricle: The right ventricular size is normal. No increase in right ventricular wall thickness. Right ventricular systolic function is normal. Tricuspid regurgitation signal is inadequate for assessing PA pressure. Left Atrium: Left atrial size was normal in size. Right Atrium: Right atrial size was normal in size. Pericardium: There is no evidence of pericardial effusion. Presence of epicardial fat layer. Mitral Valve: The mitral valve is degenerative in appearance. Mild mitral annular calcification. Trivial mitral valve regurgitation. MV peak gradient, 4.9 mmHg. The mean mitral valve gradient is 2.0 mmHg. Tricuspid Valve: The tricuspid valve is grossly normal. Tricuspid valve regurgitation is trivial. Aortic Valve: The aortic valve is tricuspid. There is mild aortic valve annular calcification. Aortic valve regurgitation is not visualized. No aortic stenosis is present. Aortic valve peak gradient measures 7.6 mmHg. Pulmonic Valve: The pulmonic valve was grossly  normal. Pulmonic valve regurgitation is trivial. Aorta: The aortic root is normal in size and structure. Venous: The inferior vena cava is normal in size with greater than 50% respiratory variability, suggesting right atrial pressure of 3 mmHg. IAS/Shunts: No atrial level shunt detected by color flow Doppler. Additional Comments: 3D was performed not requiring image post processing on an independent workstation and was indeterminate.  LEFT VENTRICLE PLAX 2D LVIDd:         4.40 cm     Diastology LVIDs:         2.50 cm     LV e' medial:    5.87  cm/s LV PW:         0.90 cm     LV E/e' medial:  13.7 LV IVS:        0.80 cm     LV e' lateral:   9.03 cm/s LVOT diam:     2.10 cm     LV E/e' lateral: 8.9 LVOT Area:     3.46 cm                            2D Longitudinal Strain                            2D Strain GLS (A4C):   -17.4 % LV Volumes (MOD)           2D Strain GLS (A3C):   -21.3 % LV vol d, MOD A2C: 34.6 ml 2D Strain GLS (A2C):   -18.0 % LV vol d, MOD A4C: 44.9 ml 2D Strain GLS Avg:     -18.9 % LV vol s, MOD A2C: 10.2 ml LV vol s, MOD A4C: 15.8 ml LV SV MOD A2C:     24.4 ml LV SV MOD A4C:     44.9 ml LV SV MOD BP:      26.6 ml RIGHT VENTRICLE             IVC RV Basal diam:  2.10 cm     IVC diam: 1.70 cm RV Mid diam:    2.30 cm RV S prime:     12.30 cm/s  PULMONARY VEINS TAPSE (M-mode): 1.7 cm      Diastolic Velocity: 54.70 cm/s                             S/D Velocity:       1.10                             Systolic Velocity:  60.20 cm/s LEFT ATRIUM           Index        RIGHT ATRIUM          Index LA diam:      3.10 cm 1.84 cm/m   RA Area:     8.08 cm LA Vol (A2C): 25.1 ml 14.92 ml/m  RA Volume:   14.90 ml 8.86 ml/m LA Vol (A4C): 13.2 ml 7.85 ml/m  AORTIC VALVE                 PULMONIC VALVE AV Area (Vmax): 3.56 cm     PV Vmax:       1.02 m/s AV Vmax:        138.00 cm/s  PV Peak grad:  4.1 mmHg AV Peak Grad:   7.6 mmHg LVOT Vmax:      142.00 cm/s  AORTA Ao Root diam: 3.20 cm MITRAL VALVE MV Area (PHT): 2.43  cm     SHUNTS MV Peak grad:  4.9 mmHg     Systemic Diam: 2.10 cm MV Mean grad:  2.0 mmHg MV Vmax:       1.11 m/s MV Vmean:      57.0 cm/s MV Decel Time: 312 msec MV E velocity: 80.40 cm/s MV A velocity: 104.00 cm/s MV E/A ratio:  0.77 Jayson Sierras MD Electronically signed by Samuel Mcdowell  MD Signature Date/Time: 01/31/2024/10:32:29 AM    Final      ASSESSMENT & PLAN:  63 y.o. female with  #1  IgA kappa multiple myeloma status post induction treatment with Carfilzomib  Revlimid  dexamethasone   presenting with anemia hemoglobin 9.4. Creatinine 1.28 No hypercalcemia Bone survey with no obvious skeletal lesions. Baseline M spike of 4.3 g/dL. Beta-2  Microglobulin at 2.5 Mol Cy Monosomy 13 and dup 1q   2) status post stem cell collection assisted with G-CSF and Plerixafor. Currently on maintenance Revlimid . Currently had declined proceeding with consolidative autologous HSCT   3) Diarrhea - ? Related to Revlimid .  Mild and nearly resolved GI panel and c diff neg Role of as needed Imodium   PLAN: - Discussed lab results on 02/28/2024 in detail with patient: CBC showed WBC of 3.6K increased from 3.1, Hemoglobin of 12.5 increased from 11.3, and PLTs of 177K increased from 144K CMP with Creatinine 1.05 decreased from 1.14 and LFTs improved. Iron Panel and Ferritin: Iron% 18 and Ferritin 426 12/28/2023 M protein not observed  - Discussed reducing dose of Revlimid  to improve diarrhea, but she is managing well enough with Imodium.  -continue current dose of maintenance Revlimid   FOLLOW-UP in 12 weeks for labs and follow-up with Dr. Onesimo.  The total time spent in the appointment was 23 minutes* .  All of the patient's questions were answered and the patient knows to call the clinic with any problems, questions, or concerns.  Emaline Onesimo MD MS AAHIVMS [facility] Hematology/Oncology Physician [facility]  *Total Encounter Time as defined by the Centers for Medicare and  Medicaid Services includes, in addition to the face-to-face time of a patient visit (documented in the note above) non-face-to-face time: obtaining and reviewing outside history, ordering and reviewing medications, tests or procedures, care coordination (communications with other health care professionals or caregivers) and documentation in the medical record.  I,Emily Lagle,acting as a neurosurgeon for Emaline Onesimo, MD.,have documented all relevant documentation on the behalf of Emaline Onesimo, MD,as directed by  Emaline Onesimo, MD while in the presence of Emaline Onesimo, MD.  I have reviewed the above documentation for accuracy and completeness, and I agree with the above.  Kynlee Koenigsberg, MD

## 2024-02-29 ENCOUNTER — Other Ambulatory Visit: Payer: Self-pay

## 2024-02-29 DIAGNOSIS — C9000 Multiple myeloma not having achieved remission: Secondary | ICD-10-CM

## 2024-02-29 MED ORDER — LENALIDOMIDE 10 MG PO CAPS: ORAL_CAPSULE | ORAL | 0 refills | Status: DC

## 2024-02-29 NOTE — Progress Notes (Signed)
 Returned call to pt. Last night after having her port flushed yesterday, pt states port felt like she was having a spasm near port site. Pt states this am she felt like something was sticking in the back of her port. Pt denies redness, edema, pain or heat at the site currently. Pt instructed to call back if anything changes/develops symptoms. Pt agreed.

## 2024-03-03 LAB — MULTIPLE MYELOMA PANEL, SERUM
Albumin SerPl Elph-Mcnc: 3.5 g/dL (ref 2.9–4.4)
Albumin/Glob SerPl: 1.5 (ref 0.7–1.7)
Alpha 1: 0.2 g/dL (ref 0.0–0.4)
Alpha2 Glob SerPl Elph-Mcnc: 0.8 g/dL (ref 0.4–1.0)
B-Globulin SerPl Elph-Mcnc: 0.9 g/dL (ref 0.7–1.3)
Gamma Glob SerPl Elph-Mcnc: 0.6 g/dL (ref 0.4–1.8)
Globulin, Total: 2.5 g/dL (ref 2.2–3.9)
IgA: 119 mg/dL (ref 87–352)
IgG (Immunoglobin G), Serum: 591 mg/dL (ref 586–1602)
IgM (Immunoglobulin M), Srm: 23 mg/dL — ABNORMAL LOW (ref 26–217)
Total Protein ELP: 6 g/dL (ref 6.0–8.5)

## 2024-03-05 ENCOUNTER — Encounter: Payer: Self-pay | Admitting: Hematology

## 2024-03-27 ENCOUNTER — Other Ambulatory Visit: Payer: Self-pay

## 2024-03-27 DIAGNOSIS — C9000 Multiple myeloma not having achieved remission: Secondary | ICD-10-CM

## 2024-03-27 MED ORDER — LENALIDOMIDE 10 MG PO CAPS: ORAL_CAPSULE | ORAL | 0 refills | Status: DC

## 2024-04-04 ENCOUNTER — Encounter: Payer: Self-pay | Admitting: Hematology

## 2024-04-04 NOTE — Progress Notes (Signed)
 This encounter was created in error - please disregard.

## 2024-04-19 ENCOUNTER — Other Ambulatory Visit: Payer: Self-pay

## 2024-04-19 DIAGNOSIS — C9000 Multiple myeloma not having achieved remission: Secondary | ICD-10-CM

## 2024-04-19 MED ORDER — LENALIDOMIDE 10 MG PO CAPS: ORAL_CAPSULE | ORAL | 0 refills | Status: AC

## 2024-05-09 ENCOUNTER — Inpatient Hospital Stay

## 2024-05-09 ENCOUNTER — Inpatient Hospital Stay: Attending: Hematology | Admitting: Hematology

## 2024-05-09 ENCOUNTER — Other Ambulatory Visit: Payer: Self-pay

## 2024-05-09 DIAGNOSIS — C9000 Multiple myeloma not having achieved remission: Secondary | ICD-10-CM

## 2024-05-09 LAB — CMP (CANCER CENTER ONLY)
ALT: 18 U/L (ref 0–44)
AST: 19 U/L (ref 15–41)
Albumin: 4.2 g/dL (ref 3.5–5.0)
Alkaline Phosphatase: 80 U/L (ref 38–126)
Anion gap: 10 (ref 5–15)
BUN: 19 mg/dL (ref 8–23)
CO2: 26 mmol/L (ref 22–32)
Calcium: 9.1 mg/dL (ref 8.9–10.3)
Chloride: 100 mmol/L (ref 98–111)
Creatinine: 1.17 mg/dL — ABNORMAL HIGH (ref 0.44–1.00)
GFR, Estimated: 52 mL/min — ABNORMAL LOW
Glucose, Bld: 98 mg/dL (ref 70–99)
Potassium: 4.2 mmol/L (ref 3.5–5.1)
Sodium: 136 mmol/L (ref 135–145)
Total Bilirubin: 0.5 mg/dL (ref 0.0–1.2)
Total Protein: 6.3 g/dL — ABNORMAL LOW (ref 6.5–8.1)

## 2024-05-09 LAB — CBC WITH DIFFERENTIAL (CANCER CENTER ONLY)
Abs Immature Granulocytes: 0.02 10*3/uL (ref 0.00–0.07)
Basophils Absolute: 0 10*3/uL (ref 0.0–0.1)
Basophils Relative: 1 %
Eosinophils Absolute: 0.1 10*3/uL (ref 0.0–0.5)
Eosinophils Relative: 2 %
HCT: 34.7 % — ABNORMAL LOW (ref 36.0–46.0)
Hemoglobin: 12 g/dL (ref 12.0–15.0)
Immature Granulocytes: 0 %
Lymphocytes Relative: 21 %
Lymphs Abs: 1.2 10*3/uL (ref 0.7–4.0)
MCH: 31.7 pg (ref 26.0–34.0)
MCHC: 34.6 g/dL (ref 30.0–36.0)
MCV: 91.8 fL (ref 80.0–100.0)
Monocytes Absolute: 0.6 10*3/uL (ref 0.1–1.0)
Monocytes Relative: 11 %
Neutro Abs: 3.5 10*3/uL (ref 1.7–7.7)
Neutrophils Relative %: 65 %
Platelet Count: 155 10*3/uL (ref 150–400)
RBC: 3.78 MIL/uL — ABNORMAL LOW (ref 3.87–5.11)
RDW: 13.7 % (ref 11.5–15.5)
WBC Count: 5.4 10*3/uL (ref 4.0–10.5)
nRBC: 0 % (ref 0.0–0.2)

## 2024-05-11 ENCOUNTER — Other Ambulatory Visit: Payer: Self-pay | Admitting: Hematology

## 2024-05-11 DIAGNOSIS — C9000 Multiple myeloma not having achieved remission: Secondary | ICD-10-CM

## 2024-05-12 ENCOUNTER — Encounter: Payer: Self-pay | Admitting: Hematology

## 2024-06-14 ENCOUNTER — Inpatient Hospital Stay: Attending: Hematology | Admitting: Hematology

## 2024-06-14 ENCOUNTER — Inpatient Hospital Stay
